# Patient Record
Sex: Male | Born: 1940 | Race: Black or African American | Hispanic: No | Smoking: Never smoker
Health system: Southern US, Community
[De-identification: ages and names within clinical notes are randomized; demographics above are authoritative.]

## PROBLEM LIST (undated history)

## (undated) DIAGNOSIS — I10 Essential (primary) hypertension: Secondary | ICD-10-CM

## (undated) DIAGNOSIS — Z972 Presence of dental prosthetic device (complete) (partial): Secondary | ICD-10-CM

## (undated) DIAGNOSIS — K56609 Unspecified intestinal obstruction, unspecified as to partial versus complete obstruction: Secondary | ICD-10-CM

## (undated) DIAGNOSIS — D649 Anemia, unspecified: Secondary | ICD-10-CM

## (undated) DIAGNOSIS — I2510 Atherosclerotic heart disease of native coronary artery without angina pectoris: Secondary | ICD-10-CM

## (undated) DIAGNOSIS — C61 Malignant neoplasm of prostate: Secondary | ICD-10-CM

## (undated) DIAGNOSIS — K219 Gastro-esophageal reflux disease without esophagitis: Secondary | ICD-10-CM

## (undated) DIAGNOSIS — E7800 Pure hypercholesterolemia, unspecified: Secondary | ICD-10-CM

## (undated) DIAGNOSIS — Z8719 Personal history of other diseases of the digestive system: Secondary | ICD-10-CM

## (undated) DIAGNOSIS — E119 Type 2 diabetes mellitus without complications: Secondary | ICD-10-CM

## (undated) DIAGNOSIS — C20 Malignant neoplasm of rectum: Secondary | ICD-10-CM

## (undated) HISTORY — PX: PROSTATE SURGERY: SHX751

## (undated) HISTORY — PX: COLOSTOMY: SHX63

---
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
## 1998-03-11 DIAGNOSIS — C61 Malignant neoplasm of prostate: Secondary | ICD-10-CM

## 1998-03-11 HISTORY — DX: Malignant neoplasm of prostate: C61

## 2003-04-13 ENCOUNTER — Other Ambulatory Visit: Payer: Self-pay

## 2003-04-14 ENCOUNTER — Other Ambulatory Visit: Payer: Self-pay

## 2003-04-15 ENCOUNTER — Other Ambulatory Visit: Payer: Self-pay

## 2013-05-28 DIAGNOSIS — B351 Tinea unguium: Secondary | ICD-10-CM | POA: Insufficient documentation

## 2017-09-16 DIAGNOSIS — Z794 Long term (current) use of insulin: Secondary | ICD-10-CM | POA: Insufficient documentation

## 2017-09-16 DIAGNOSIS — I10 Essential (primary) hypertension: Secondary | ICD-10-CM | POA: Insufficient documentation

## 2017-09-16 DIAGNOSIS — E119 Type 2 diabetes mellitus without complications: Secondary | ICD-10-CM | POA: Insufficient documentation

## 2017-09-16 DIAGNOSIS — N400 Enlarged prostate without lower urinary tract symptoms: Secondary | ICD-10-CM | POA: Insufficient documentation

## 2017-09-16 DIAGNOSIS — I2510 Atherosclerotic heart disease of native coronary artery without angina pectoris: Secondary | ICD-10-CM | POA: Insufficient documentation

## 2017-10-31 ENCOUNTER — Encounter: Payer: Self-pay | Admitting: *Deleted

## 2017-10-31 ENCOUNTER — Observation Stay
Admission: EM | Admit: 2017-10-31 | Discharge: 2017-11-01 | Disposition: A | Discharge status: Home / Self Care | Payer: Medicare Other | Attending: Internal Medicine | Admitting: Internal Medicine

## 2017-10-31 ENCOUNTER — Emergency Department: Payer: Medicare Other

## 2017-10-31 ENCOUNTER — Other Ambulatory Visit: Payer: Self-pay

## 2017-10-31 DIAGNOSIS — I10 Essential (primary) hypertension: Secondary | ICD-10-CM | POA: Insufficient documentation

## 2017-10-31 DIAGNOSIS — R531 Weakness: Secondary | ICD-10-CM | POA: Diagnosis not present

## 2017-10-31 DIAGNOSIS — Z794 Long term (current) use of insulin: Secondary | ICD-10-CM | POA: Diagnosis not present

## 2017-10-31 DIAGNOSIS — I4729 Other ventricular tachycardia: Secondary | ICD-10-CM

## 2017-10-31 DIAGNOSIS — I472 Ventricular tachycardia, unspecified: Secondary | ICD-10-CM

## 2017-10-31 DIAGNOSIS — E1165 Type 2 diabetes mellitus with hyperglycemia: Secondary | ICD-10-CM | POA: Insufficient documentation

## 2017-10-31 DIAGNOSIS — E7800 Pure hypercholesterolemia, unspecified: Secondary | ICD-10-CM | POA: Insufficient documentation

## 2017-10-31 DIAGNOSIS — N179 Acute kidney failure, unspecified: Secondary | ICD-10-CM | POA: Diagnosis not present

## 2017-10-31 DIAGNOSIS — R002 Palpitations: Secondary | ICD-10-CM | POA: Insufficient documentation

## 2017-10-31 DIAGNOSIS — Z66 Do not resuscitate: Secondary | ICD-10-CM | POA: Insufficient documentation

## 2017-10-31 DIAGNOSIS — Z7982 Long term (current) use of aspirin: Secondary | ICD-10-CM | POA: Diagnosis not present

## 2017-10-31 DIAGNOSIS — Z79899 Other long term (current) drug therapy: Secondary | ICD-10-CM | POA: Diagnosis not present

## 2017-10-31 DIAGNOSIS — E860 Dehydration: Secondary | ICD-10-CM | POA: Diagnosis not present

## 2017-10-31 DIAGNOSIS — Z7901 Long term (current) use of anticoagulants: Secondary | ICD-10-CM | POA: Insufficient documentation

## 2017-10-31 DIAGNOSIS — I82409 Acute embolism and thrombosis of unspecified deep veins of unspecified lower extremity: Secondary | ICD-10-CM | POA: Diagnosis not present

## 2017-10-31 DIAGNOSIS — I2510 Atherosclerotic heart disease of native coronary artery without angina pectoris: Secondary | ICD-10-CM | POA: Diagnosis not present

## 2017-10-31 HISTORY — DX: Pure hypercholesterolemia, unspecified: E78.00

## 2017-10-31 HISTORY — DX: Type 2 diabetes mellitus without complications: E11.9

## 2017-10-31 HISTORY — DX: Essential (primary) hypertension: I10

## 2017-10-31 HISTORY — DX: Atherosclerotic heart disease of native coronary artery without angina pectoris: I25.10

## 2017-10-31 LAB — COMPREHENSIVE METABOLIC PANEL
ALK PHOS: 62 U/L (ref 38–126)
ALT: 20 U/L (ref 0–44)
ANION GAP: 9 (ref 5–15)
AST: 28 U/L (ref 15–41)
Albumin: 3.6 g/dL (ref 3.5–5.0)
BUN: 22 mg/dL (ref 8–23)
CALCIUM: 8.9 mg/dL (ref 8.9–10.3)
CO2: 22 mmol/L (ref 22–32)
Chloride: 106 mmol/L (ref 98–111)
Creatinine, Ser: 1.51 mg/dL — ABNORMAL HIGH (ref 0.61–1.24)
GFR calc non Af Amer: 42 mL/min — ABNORMAL LOW (ref >60)
GFR, EST AFRICAN AMERICAN: 49 mL/min — AB (ref >60)
Glucose, Bld: 206 mg/dL — ABNORMAL HIGH (ref 70–99)
POTASSIUM: 3.6 mmol/L (ref 3.5–5.1)
SODIUM: 137 mmol/L (ref 135–145)
Total Bilirubin: 0.7 mg/dL (ref 0.3–1.2)
Total Protein: 6.8 g/dL (ref 6.5–8.1)

## 2017-10-31 LAB — CBC WITH DIFFERENTIAL/PLATELET
Basophils Absolute: 0 10*3/uL (ref 0–0.1)
Basophils Relative: 1 %
EOS ABS: 0 10*3/uL (ref 0–0.7)
EOS PCT: 1 %
HCT: 33.1 % — ABNORMAL LOW (ref 40.0–52.0)
Hemoglobin: 11.1 g/dL — ABNORMAL LOW (ref 13.0–18.0)
Lymphocytes Relative: 28 %
Lymphs Abs: 1.4 10*3/uL (ref 1.0–3.6)
MCH: 26.8 pg (ref 26.0–34.0)
MCHC: 33.5 g/dL (ref 32.0–36.0)
MCV: 80.1 fL (ref 80.0–100.0)
MONOS PCT: 9 %
Monocytes Absolute: 0.4 10*3/uL (ref 0.2–1.0)
Neutro Abs: 3.1 10*3/uL (ref 1.4–6.5)
Neutrophils Relative %: 61 %
PLATELETS: 254 10*3/uL (ref 150–440)
RBC: 4.14 MIL/uL — AB (ref 4.40–5.90)
RDW: 16.1 % — ABNORMAL HIGH (ref 11.5–14.5)
WBC: 5 10*3/uL (ref 3.8–10.6)

## 2017-10-31 LAB — PROTIME-INR
INR: 1.03
PROTHROMBIN TIME: 13.4 s (ref 11.4–15.2)

## 2017-10-31 LAB — ETHANOL: Alcohol, Ethyl (B): <10 mg/dL (ref <10)

## 2017-10-31 LAB — CK: Total CK: 287 U/L (ref 49–397)

## 2017-10-31 LAB — MAGNESIUM: Magnesium: 1.9 mg/dL (ref 1.7–2.4)

## 2017-10-31 LAB — TROPONIN I: TROPONIN I: 0.05 ng/mL — AB (ref <0.03)

## 2017-10-31 LAB — GLUCOSE, CAPILLARY: Glucose-Capillary: 126 mg/dL — ABNORMAL HIGH (ref 70–99)

## 2017-10-31 LAB — LACTIC ACID, PLASMA
LACTIC ACID, VENOUS: 2 mmol/L — AB (ref 0.5–1.9)
Lactic Acid, Venous: 1 mmol/L (ref 0.5–1.9)

## 2017-10-31 LAB — LIPASE, BLOOD: LIPASE: 23 U/L (ref 11–51)

## 2017-10-31 MED ORDER — NIACIN ER (ANTIHYPERLIPIDEMIC) 500 MG PO TBCR
500.0000 mg | EXTENDED_RELEASE_TABLET | Freq: Every evening | ORAL | Status: DC
  Filled 2017-10-31: qty 1

## 2017-10-31 MED ORDER — INSULIN ASPART 100 UNIT/ML ~~LOC~~ SOLN
0.0000 [IU] | Freq: Three times a day (TID) | SUBCUTANEOUS | Status: DC
  Administered 2017-11-01: 3 [IU] via SUBCUTANEOUS
  Filled 2017-10-31: qty 1

## 2017-10-31 MED ORDER — AMIODARONE HCL IN DEXTROSE 360-4.14 MG/200ML-% IV SOLN
30.0000 mg/h | INTRAVENOUS | Status: DC
  Administered 2017-11-01: 30 mg/h via INTRAVENOUS
  Filled 2017-10-31: qty 200

## 2017-10-31 MED ORDER — EZETIMIBE 10 MG PO TABS
10.0000 mg | ORAL_TABLET | Freq: Every day | ORAL | Status: DC
  Administered 2017-11-01: 10 mg via ORAL
  Filled 2017-10-31: qty 1

## 2017-10-31 MED ORDER — ACETAMINOPHEN 325 MG PO TABS: 650.0000 mg | ORAL_TABLET | Freq: Four times a day (QID) | ORAL | Status: DC | PRN

## 2017-10-31 MED ORDER — SODIUM CHLORIDE 0.9 % IV BOLUS
1000.0000 mL | Freq: Once | INTRAVENOUS | Status: AC
  Administered 2017-10-31: 1000 mL via INTRAVENOUS

## 2017-10-31 MED ORDER — AMIODARONE IV BOLUS ONLY 150 MG/100ML
150.0000 mg | Freq: Once | INTRAVENOUS | Status: AC
  Administered 2017-10-31: 150 mg via INTRAVENOUS
  Filled 2017-10-31: qty 100

## 2017-10-31 MED ORDER — SODIUM CHLORIDE 0.9 % IV SOLN
INTRAVENOUS | Status: AC
  Administered 2017-10-31: via INTRAVENOUS

## 2017-10-31 MED ORDER — POLYETHYLENE GLYCOL 3350 17 G PO PACK: 17.0000 g | PACK | Freq: Every day | ORAL | Status: DC | PRN

## 2017-10-31 MED ORDER — SODIUM CHLORIDE 0.9 % IV SOLN
INTRAVENOUS | Status: DC | PRN
  Administered 2017-10-31: 19:00:00 via INTRAVENOUS

## 2017-10-31 MED ORDER — METOPROLOL SUCCINATE ER 50 MG PO TB24
50.0000 mg | ORAL_TABLET | Freq: Every day | ORAL | Status: DC
  Administered 2017-11-01: 50 mg via ORAL
  Filled 2017-10-31: qty 1

## 2017-10-31 MED ORDER — ASPIRIN EC 81 MG PO TBEC: 81.0000 mg | DELAYED_RELEASE_TABLET | Freq: Every evening | ORAL | Status: DC

## 2017-10-31 MED ORDER — ACETAMINOPHEN 650 MG RE SUPP: 650.0000 mg | Freq: Four times a day (QID) | RECTAL | Status: DC | PRN

## 2017-10-31 MED ORDER — ENOXAPARIN SODIUM 40 MG/0.4ML ~~LOC~~ SOLN: 40.0000 mg | SUBCUTANEOUS | Status: DC

## 2017-10-31 MED ORDER — AMIODARONE HCL IN DEXTROSE 360-4.14 MG/200ML-% IV SOLN
60.0000 mg/h | INTRAVENOUS | Status: AC
  Administered 2017-10-31: 60 mg/h via INTRAVENOUS
  Filled 2017-10-31: qty 200

## 2017-10-31 MED ORDER — MAGNESIUM SULFATE 2 GM/50ML IV SOLN
2.0000 g | Freq: Once | INTRAVENOUS | Status: AC
  Administered 2017-10-31: 2 g via INTRAVENOUS
  Filled 2017-10-31: qty 50

## 2017-10-31 MED ORDER — INSULIN ASPART 100 UNIT/ML ~~LOC~~ SOLN: 0.0000 [IU] | Freq: Every day | SUBCUTANEOUS | Status: DC

## 2017-10-31 NOTE — H&P (Signed)
[facility] NAME: Willie [name]    MR#:  [id number]  DATE OF BIRTH:  08/23/1940  DATE OF ADMISSION:  10/31/2017  PRIMARY CARE PHYSICIAN: [name], MD   REQUESTING/REFERRING PHYSICIAN: Hinda Kehr, MD  CHIEF COMPLAINT:   Chief Complaint  Patient presents with  . Irregular Heart Beat    HISTORY OF PRESENT ILLNESS:  Willie [name]  is a 77 y.o. male with a known history of CAD, type 2 diabetes, hypertension, hyperlipidemia who presented to the ED with weakness. He was mowing the lawn this morning when he suddenly felt lightheaded and weak.  He states he had not drink much water during the day.  He checked his blood sugar and it was 120.  He ate a snack.  Later that afternoon around 4 PM, he started getting sweaty and shaky.  He has a friend who is an EMT and he noticed that he had an irregular heart rate.  The EMT suggested that he come to the hospital for further evaluation.  Patient denies any chest pain, shortness of breath, or palpitations.   In the ED, he was noted to have multiple episodes of nonsustained ventricular tachycardia.  He was asymptomatic during the V. tach.  EDP discussed the case with Dr. Ubaldo Glassing who recommended that patient receive IV amiodarone 150 mg, followed by an amiodarone drip.  He was also given magnesium 2 g IV.  Hospitalists were called for admission.  PAST MEDICAL HISTORY:   Past Medical History:  Diagnosis Date  . Coronary artery disease   . Diabetes mellitus without complication ([place])   . Hypercholesterolemia   . Hypertension     PAST SURGICAL HISTORY:   Past Surgical History:  Procedure Laterality Date  . PROSTATE SURGERY      SOCIAL HISTORY:   Social History   Tobacco Use  . Smoking status: Never Smoker  . Smokeless tobacco: Never Used  Substance Use Topics  . Alcohol use: Not Currently    FAMILY HISTORY:  History reviewed. No pertinent family history.  DRUG ALLERGIES:    No Known Allergies  REVIEW OF SYSTEMS:   Review of Systems  Constitutional: Negative for chills and fever.  HENT: Negative for congestion and sore throat.   Eyes: Negative for blurred vision and double vision.  Respiratory: Negative for cough and shortness of breath.   Cardiovascular: Negative for chest pain, palpitations and leg swelling.  Gastrointestinal: Negative for abdominal pain, nausea and vomiting.  Genitourinary: Negative for dysuria and frequency.  Musculoskeletal: Negative for back pain and neck pain.  Neurological: Positive for dizziness and weakness. Negative for headaches.  Psychiatric/Behavioral: Negative for depression and hallucinations.     MEDICATIONS AT HOME:   Prior to Admission medications   Medication Sig Start Date End Date Taking? Authorizing Provider  HUMALOG MIX 75/25 KWIKPEN (75-25) 100 UNIT/ML Kwikpen  08/02/17  Yes [provider]  lisinopril (PRINIVIL,ZESTRIL) 5 MG tablet Take 5 mg by mouth daily. 08/02/17  Yes [provider]  metoprolol succinate (TOPROL-XL) 50 MG 24 hr tablet Take 50 mg by mouth daily. 10/03/17  Yes [provider]  niacin (NIASPAN) 500 MG CR tablet Take 500 mg by mouth daily. 08/02/17  Yes [provider]      VITAL SIGNS:  Blood pressure 131/67, pulse 78, temperature 99.1 F (37.3 C), temperature source Oral, resp. rate 13, height 5\' 3"  (1.6 m), weight 83.9 kg, SpO2 100 %.  PHYSICAL EXAMINATION:  Physical Exam  GENERAL:  77 y.o.-year-old patient lying in the bed with no acute distress.  EYES: Pupils equal, round, reactive to light and accommodation. No scleral icterus. Extraocular muscles intact.  HEENT: Head atraumatic, normocephalic. Oropharynx and nasopharynx clear.  NECK:  Supple, no jugular venous distention. No thyroid enlargement, no tenderness.  LUNGS: Normal breath sounds bilaterally, no wheezing, rales,rhonchi or crepitation. No use of accessory muscles of respiration.   CARDIOVASCULAR: RRR, S1, S2 normal. No murmurs, rubs, or gallops.  ABDOMEN: Soft, nontender, nondistended. Bowel sounds present. No organomegaly or mass.  EXTREMITIES: No pedal edema, cyanosis, or clubbing.  NEUROLOGIC: Cranial nerves II through XII are intact. Muscle strength 5/5 in all extremities. Sensation intact. Gait not checked.  PSYCHIATRIC: The patient is alert and oriented x 3.  SKIN: No obvious rash, lesion, or ulcer.   LABORATORY PANEL:   CBC Recent Labs  Lab 10/31/17 1702  WBC 5.0  HGB 11.1*  HCT 33.1*  PLT 254   ------------------------------------------------------------------------------------------------------------------  Chemistries  Recent Labs  Lab 10/31/17 1702  NA 137  K 3.6  CL 106  CO2 22  GLUCOSE 206*  BUN 22  CREATININE 1.51*  CALCIUM 8.9  MG 1.9  AST 28  ALT 20  ALKPHOS 62  BILITOT 0.7   ------------------------------------------------------------------------------------------------------------------  Cardiac Enzymes Recent Labs  Lab 10/31/17 1702  TROPONINI 0.05*   ------------------------------------------------------------------------------------------------------------------  RADIOLOGY:  Dg Chest Port 1 View  Result Date: 10/31/2017 CLINICAL DATA:  Irregular heartbeat EXAM: PORTABLE CHEST 1 VIEW COMPARISON:  None. FINDINGS: The heart size and mediastinal contours are within normal limits. Both lungs are clear. The visualized skeletal structures are unremarkable. IMPRESSION: No active disease. Electronically Signed   By: Inez Catalina M.D.   On: 10/31/2017 17:22      IMPRESSION AND PLAN:   Recurrent episodes of ventricular tachycardia- asymptomatic during episodes. Hemodynamically stable. Now in NSR. - s/p amiodarone 150mg  IV, now on amiodarone drip - continue home metoprolol - cardiology consulted - cardiac monitoring   Lactic acidosis- likely related to working outside today and not drinking enough water.  - trend  lactic acid - MIVFs  AKI- likely related to dehydration with working outside and not drinking water today. Cr 1.51 in the ED (only previous value per record is 1.2). - hold lisinopril - MIVFs - recheck Cr in the morning  Hypertension- normotensive - holding lisinopril - continue metoprolol  Type 2 diabetes- hyperglycemic to 206. - moderate SSI  Hyperlipidemia- stable - continue zetia and niacin  All the records are reviewed and case discussed with ED provider. Management plans discussed with the patient, family and they are in agreement.  CODE STATUS: DNI  TOTAL TIME TAKING CARE OF THIS PATIENT: 35 minutes.    Berna Spare Mabrey Howland M.D on 10/31/2017 at 7:50 PM  Between 7am to 6pm - Pager - [phone]  After 6pm go to [email or web address] - Proofreader  [facility]  Office  [phone]  CC: Primary care physician; [name], MD   Note: This dictation was prepared with Dragon dictation along with smaller phrase technology. Any transcriptional errors that result from this process are unintentional.

## 2017-10-31 NOTE — ED Notes (Signed)
Placed on zoll   Iv infusing well  Pt on cardiac monitor

## 2017-10-31 NOTE — ED Notes (Signed)
Pt given meal [place] sandwich and  Taken well

## 2017-10-31 NOTE — ED Notes (Signed)
Verbal order from admitting MD to change order to telemetry

## 2017-10-31 NOTE — ED Triage Notes (Signed)
Patient arrived Via ems pt was working outside today in [place] weak  diaporetic   Feels better  Now  Denies any chest pain he is awake and alert  At this time  Iv infusing well

## 2017-10-31 NOTE — ED Notes (Signed)
Patient is laying comfortably in stretcher at this time with no signs of distress present. Equal, unlabored rise and fall of chest noted within normal rate. VS stable. Continues to deny pain or abnormal feeling. Will continue to monitor.

## 2017-10-31 NOTE — ED Notes (Signed)
Report given to brittany at this time

## 2017-10-31 NOTE — ED Notes (Signed)
Pt continues to have periodic runs of v-tach, but more sustained, Dr. Karma Greaser notified, new orders received.

## 2017-10-31 NOTE — ED Notes (Signed)
Pt sitting in stretcher, with VSS, in NAD at this time, displaying equal and unlabored respirations. Pt family at bedside. All deny needs at this time. Will continue to monitor.

## 2017-10-31 NOTE — ED Notes (Signed)
2A unable to take report. Asked me to call back in 10 min

## 2017-10-31 NOTE — ED Provider Notes (Signed)
[facility] Emergency Department Provider Note  ____________________________________________   First MD Initiated Contact with Patient 10/31/17 1700     (approximate)  I have reviewed the triage vital signs and the nursing notes.   HISTORY  Chief Complaint Irregular Heart Beat    HPI Willie [name] is a 77 y.o. male with medical history as listed below who goes to Dr. Clayborn Bigness for his cardiology care.  He presents by EMS for evaluation of acute onset of weakness, sweating, palpitations.  He says he feels much better now.  He was doing some yard work outside and thinks he just got overheated.  He denies any nausea or vomiting.  He has no abdominal pain or chest pain and has had no shortness of breath.  Of note, EMS reported multiple nonsustained runs of ventricular tachycardia in route to the hospital.  He is in normal sinus rhythm upon arrival but had another short run of ventricular tachycardia after he was on the monitor.  See hospital course below.  Patient states he has not had any recent fever/chills and has no dysuria.  He has had no cramping in his muscles.  No recent medication changes.  Denies drugs and alcohol.  Says that the symptoms were severe when they started but now he feels fine.  Nothing in particular made the symptoms better or worse.  Past Medical History:  Diagnosis Date  . Coronary artery disease   . Diabetes mellitus without complication ([place])   . Hypercholesterolemia   . Hypertension     There are no active problems to display for this patient.   Past Surgical History:  Procedure Laterality Date  . PROSTATE SURGERY      Prior to Admission medications   Medication Sig Start Date End Date Taking? Authorizing Provider  HUMALOG MIX 75/25 KWIKPEN (75-25) 100 UNIT/ML Kwikpen  08/02/17  Yes [provider]  lisinopril (PRINIVIL,ZESTRIL) 5 MG tablet Take 5 mg by mouth daily. 08/02/17  Yes [provider]  metoprolol  succinate (TOPROL-XL) 50 MG 24 hr tablet Take 50 mg by mouth daily. 10/03/17  Yes [provider]  niacin (NIASPAN) 500 MG CR tablet Take 500 mg by mouth daily. 08/02/17  Yes [provider]    Allergies Patient has no known allergies.  History reviewed. No pertinent family history.  Social History Social History   Tobacco Use  . Smoking status: Never Smoker  . Smokeless tobacco: Never Used  Substance Use Topics  . Alcohol use: Not Currently  . Drug use: Not on file    Review of Systems Constitutional: No fever/chills Eyes: No visual changes. ENT: No sore throat. Cardiovascular: Palpitations.  Denies chest pain.  Acute episode of diaphoresis and lightheadedness. Respiratory: Denies shortness of breath. Gastrointestinal: No abdominal pain.  No nausea, no vomiting.  No diarrhea.  No constipation. Genitourinary: Negative for dysuria. Musculoskeletal: Negative for neck pain.  Negative for back pain. Integumentary: Negative for rash. Neurological: Negative for headaches, focal weakness or numbness.   ____________________________________________   PHYSICAL EXAM:  VITAL SIGNS: ED Triage Vitals  Enc Vitals Group     BP 10/31/17 1704 116/68     Pulse Rate 10/31/17 1704 96     Resp 10/31/17 1704 (!) 23     Temp 10/31/17 1704 99.1 F (37.3 C)     Temp Source 10/31/17 1704 Oral     SpO2 10/31/17 1704 100 %     Weight 10/31/17 1713 83.9 kg (185 lb)  Height 10/31/17 1713 1.6 m (5\' 3" )     Head Circumference --      Peak Flow --      Pain Score 10/31/17 1710 0     Pain Loc --      Pain Edu? --      Excl. in [place]? --     Constitutional: Alert and oriented. Generally well appearing. Eyes: Conjunctivae are normal.  Head: Atraumatic. Nose: No congestion/rhinnorhea. Mouth/Throat: Mucous membranes are moist. Neck: No stridor.  No meningeal signs.   Cardiovascular: Normal rate, regular rhythm. However, he has intermittent episodes of non-sustained v-tach.   Good peripheral circulation. Grossly normal heart sounds. Respiratory: Normal respiratory effort.  No retractions. Lungs CTAB. Gastrointestinal: Soft and nontender. No distention.  Musculoskeletal: No lower extremity tenderness nor edema. No gross deformities of extremities. Neurologic:  Normal speech and language. No gross focal neurologic deficits are appreciated.  Skin:  Skin is warm, dry and intact. No rash noted. Psychiatric: Mood and affect are normal. Speech and behavior are normal.  ____________________________________________   LABS (all labs ordered are listed, but only abnormal results are displayed)  Labs Reviewed  COMPREHENSIVE METABOLIC PANEL - Abnormal; Notable for the following components:      Result Value   Glucose, Bld 206 (*)    Creatinine, Ser 1.51 (*)    GFR calc non Af Amer 42 (*)    GFR calc Af Amer 49 (*)    All other components within normal limits  TROPONIN I - Abnormal; Notable for the following components:   Troponin I 0.05 (*)    All other components within normal limits  LACTIC ACID, PLASMA - Abnormal; Notable for the following components:   Lactic Acid, Venous 2.0 (*)    All other components within normal limits  CBC WITH DIFFERENTIAL/PLATELET - Abnormal; Notable for the following components:   RBC 4.14 (*)    Hemoglobin 11.1 (*)    HCT 33.1 (*)    RDW 16.1 (*)    All other components within normal limits  ETHANOL  LIPASE, BLOOD  PROTIME-INR  MAGNESIUM  CK  LACTIC ACID, PLASMA   ____________________________________________  EKG  ED ECG REPORT I, Hinda Kehr, the attending physician, personally viewed and interpreted this ECG.  Date: 10/31/2017 EKG Time: 16: 58 Rate: 98 Rhythm: normal sinus rhythm QRS Axis: normal Intervals: normal ST/T Wave abnormalities: Non-specific ST segment / T-wave changes, but no evidence of acute ischemia. Narrative Interpretation: Questionable acute ischemia but no evidence of STEMI   ED ECG REPORT I,  Hinda Kehr, the attending physician, personally viewed and interpreted this ECG.  Date: 10/31/2017 EKG Time: 18: 05 Rate: 163 Rhythm: Wide-complex tachycardia consistent with V. tach QRS Axis: normal Intervals: normal ST/T Wave abnormalities: Non-specific ST segment / T-wave changes, but no evidence of acute ischemia. Narrative Interpretation: no evidence of acute ischemia    ____________________________________________  RADIOLOGY I, Hinda Kehr, personally viewed and evaluated these images (plain radiographs) as part of my medical decision making, as well as reviewing the written report by the radiologist.  ED MD interpretation:  No evidence of acute abnormality on CXR  Official radiology report(s): Dg Chest Port 1 View  Result Date: 10/31/2017 CLINICAL DATA:  Irregular heartbeat EXAM: PORTABLE CHEST 1 VIEW COMPARISON:  None. FINDINGS: The heart size and mediastinal contours are within normal limits. Both lungs are clear. The visualized skeletal structures are unremarkable. IMPRESSION: No active disease. Electronically Signed   By: Inez Catalina M.D.   On: 10/31/2017  17:22    ____________________________________________   PROCEDURES  Critical Care performed: Yes, see critical care procedure note(s)   Procedure(s) performed:   .Critical Care Performed by: Hinda Kehr, MD Authorized by: Hinda Kehr, MD   Critical care provider statement:    Critical care time (minutes):  30   Critical care time was exclusive of:  Separately billable procedures and treating other patients   Critical care was necessary to treat or prevent imminent or life-threatening deterioration of the following conditions:  Circulatory failure (ventricular tachycardia)   Critical care was time spent personally by me on the following activities:  Development of treatment plan with patient or surrogate, discussions with consultants, evaluation of patient's response to treatment, examination of patient,  obtaining history from patient or surrogate, ordering and performing treatments and interventions, ordering and review of laboratory studies, ordering and review of radiographic studies, pulse oximetry, re-evaluation of patient's condition and review of old charts     ____________________________________________   INITIAL IMPRESSION / [place] / ED COURSE  As part of my medical decision making, I reviewed the following data within the [facility] notes reviewed and incorporated, Labs reviewed , EKG interpreted , Old chart reviewed, Discussed with admitting physician , Discussed with cardiologist (Dr. Ubaldo Glassing by phone) and Notes from prior clinic visits    Differential diagnosis includes, but is not limited to, electrolyte abnormality, "silent" MI leading to cardiac muscle dysfunction, pneumonia, pericardial effusion.  The patient is in no distress and reports no chest pain and no shortness of breath.  However, he has had multiple episodes of nonsustained ventricular tachycardia with as many as 20 beats,.   However he remains asymptomatic was reassuring.  He is on the [place].  His troponin is 0.05 which likely represents demand ischemia.  Lactic acid is 2.0, likely representative of working outside it for an extended period today and is getting 1 L normal saline IV bolus.  No evidence of acute infection.  No leukocytosis, no elevated lipase, normal CK.  Metabolic panel is reassuring within normal limits except for slightly elevated creatinine of 1.5 and I have no other creatinine on record for him so this may be acute volume depletion.  I will discuss the case with Dr. Ubaldo Glassing but at this point I think that the patient should probably be on amiodarone given the recurrent ventricular tachycardia.  Clinical Course as of Nov 01 1814  Fri Oct 31, 2017  1740 Paged Dr. Ubaldo Glassing to discuss the case.   [CF]  1758 Lactic Acid, Venous(!!): 2.0 [CF]  1816 Dr. Ubaldo Glassing agreed with my  plan for amiodarone 150 mg IV followed by an amiodarone infusion.  He also recommended that I give the patient magnesium 2 g IV regardless of what his magnesium level comes back at.  I will continue hydration.  I discussed the case with the hospitalist who will admit.  Patient is hemodynamically stable.   [CF]    Clinical Course User Index [CF] Hinda Kehr, MD    ____________________________________________  FINAL CLINICAL IMPRESSION(S) / ED DIAGNOSES  Final diagnoses:  Ventricular tachycardia ([place])     MEDICATIONS GIVEN DURING THIS VISIT:  Medications  magnesium sulfate IVPB 2 g 50 mL (has no administration in time range)  amiodarone (NEXTERONE) IV bolus only 150 mg/100 mL (has no administration in time range)  amiodarone (NEXTERONE PREMIX) 360-4.14 MG/200ML-% (1.8 mg/mL) IV infusion (has no administration in time range)  amiodarone (NEXTERONE PREMIX) 360-4.14 MG/200ML-% (1.8 mg/mL) IV infusion (  has no administration in time range)  sodium chloride 0.9 % bolus 1,000 mL (has no administration in time range)     ED Discharge Orders    None       Note:  This document was prepared using Dragon voice recognition software and may include unintentional dictation errors.    Hinda Kehr, MD 10/31/17 1816

## 2017-10-31 NOTE — ED Notes (Signed)
Pt had run of v-tach around this time, was able to capture this and show Dr. Karma Greaser.

## 2017-10-31 NOTE — ED Notes (Signed)
Pt remains stable and denies c/o.

## 2017-10-31 NOTE — ED Notes (Addendum)
Pt eating sandwich tray provided at this time. Talking with family in room. Continues to deny pain or c/o. Stable NSR remains

## 2017-10-31 NOTE — ED Notes (Signed)
CCU Charge RN states that they are unable to receive pt at this time.

## 2017-10-31 NOTE — Progress Notes (Signed)
Family Meeting Note  Advance Directive:yes  Today a meeting took place with the Patient.  Patient is able to participate.  The following clinical team members were present during this meeting:MD  The following were discussed:Patient's diagnosis: , Patient's progosis: Unable to determine and Goals for treatment: Continue present management and DNR.  Additional follow-up to be provided: prn  Time spent during discussion:20 minutes  Evette Doffing, MD

## 2017-11-01 ENCOUNTER — Encounter: Payer: Self-pay | Admitting: Emergency Medicine

## 2017-11-01 LAB — COMPREHENSIVE METABOLIC PANEL
ALT: 18 U/L (ref 0–44)
AST: 22 U/L (ref 15–41)
Albumin: 3 g/dL — ABNORMAL LOW (ref 3.5–5.0)
Alkaline Phosphatase: 55 U/L (ref 38–126)
Anion gap: 5 (ref 5–15)
BILIRUBIN TOTAL: 0.3 mg/dL (ref 0.3–1.2)
BUN: 19 mg/dL (ref 8–23)
CHLORIDE: 112 mmol/L — AB (ref 98–111)
CO2: 23 mmol/L (ref 22–32)
Calcium: 8 mg/dL — ABNORMAL LOW (ref 8.9–10.3)
Creatinine, Ser: 1.16 mg/dL (ref 0.61–1.24)
GFR calc Af Amer: >60 mL/min (ref >60)
GFR, EST NON AFRICAN AMERICAN: 58 mL/min — AB (ref >60)
Glucose, Bld: 128 mg/dL — ABNORMAL HIGH (ref 70–99)
POTASSIUM: 4 mmol/L (ref 3.5–5.1)
Sodium: 140 mmol/L (ref 135–145)
Total Protein: 5.9 g/dL — ABNORMAL LOW (ref 6.5–8.1)

## 2017-11-01 LAB — GLUCOSE, CAPILLARY
GLUCOSE-CAPILLARY: 62 mg/dL — AB (ref 70–99)
GLUCOSE-CAPILLARY: 174 mg/dL — AB (ref 70–99)

## 2017-11-01 LAB — MAGNESIUM: Magnesium: 2.3 mg/dL (ref 1.7–2.4)

## 2017-11-01 NOTE — Discharge Summary (Signed)
[facility] [facility] [place] NAME: Willie [name]    MR#:  [id number]  DATE OF BIRTH:  07-Dec-1940  DATE OF ADMISSION:  10/31/2017   ADMITTING PHYSICIAN: Sela Hua, MD  DATE OF DISCHARGE:  11/01/17  PRIMARY CARE PHYSICIAN: [name], MD   ADMISSION DIAGNOSIS:   Ventricular tachycardia ([place]) [I47.2]  DISCHARGE DIAGNOSIS:   Active Problems:   Ventricular tachycardia ([place])   SECONDARY DIAGNOSIS:   Past Medical History:  Diagnosis Date  . Coronary artery disease   . Diabetes mellitus without complication ([place])   . Hypercholesterolemia   . Hypertension     HOSPITAL COURSE:   77 year old male with past medical history significant for CAD status post PCI several years ago, type 2 diabetes mellitus, hypertension presents to hospital secondary to lightheadedness and weakness and noted to have a cardiac arrhythmia  1.  Cardiac arrhythmia-initially thought to be nonsustained V. tach, concerns for underlying  paroxysmal A. Fib -Triggered due to dehydration.  Patient was given IV fluids, electrolytes replaced. -Remains in normal sinus rhythm at this time. -Echocardiogram is pending.  Cardiology consult is appreciated -Encourage ambulation and discharge today if echo is normal. -Was started on amiodarone drip after bolus, however discontinued as patient remained in normal sinus rhythm to sinus bradycardia -Patient is completely asymptomatic and feels well.  2.  Acute renal insufficiency-secondary to dehydration, received IV fluids and renal function is much improved  3.  Hypertension-continue Toprol and low-dose lisinopril  4.  Diabetes mellitus-insulin-dependent, on Humalog mix once a day, continue home medicines.  Ambulating well and will be discharged today.  DISCHARGE CONDITIONS:   Guarded  CONSULTS OBTAINED:   Treatment Team:  Teodoro Spray, MD  DRUG ALLERGIES:   No Known Allergies DISCHARGE MEDICATIONS:    Allergies as of 11/01/2017   No Known Allergies     Medication List    TAKE these medications   aspirin EC 81 MG tablet Take 81 mg by mouth every evening.   ezetimibe 10 MG tablet Commonly known as:  ZETIA Take 10 mg by mouth daily.   HUMALOG MIX 75/25 KWIKPEN (75-25) 100 UNIT/ML Kwikpen Generic drug:  Insulin Lispro Prot & Lispro Inject 35 Units into the skin every evening.   lisinopril 5 MG tablet Commonly known as:  PRINIVIL,ZESTRIL Take 5 mg by mouth daily.   metoprolol succinate 50 MG 24 hr tablet Commonly known as:  TOPROL-XL Take 50 mg by mouth daily.   niacin 500 MG CR tablet Commonly known as:  NIASPAN Take 500 mg by mouth every evening.        DISCHARGE INSTRUCTIONS:   1. PCP f/u in 1-2 weeks 2. Cardiology f/u in 1 week  DIET:   Cardiac diet  ACTIVITY:   Activity as tolerated  OXYGEN:   Home Oxygen: No.  Oxygen Delivery: room air  DISCHARGE LOCATION:   home   If you experience worsening of your admission symptoms, develop shortness of breath, life threatening emergency, suicidal or homicidal thoughts you must seek medical attention immediately by calling 911 or calling your MD immediately  if symptoms less severe.  You Must read complete instructions/literature along with all the possible adverse reactions/side effects for all the Medicines you take and that have been prescribed to you. Take any new Medicines after you have completely understood and accpet all the possible adverse reactions/side effects.   Please note  You were cared for by a hospitalist during your hospital stay.  If you have any questions about your discharge medications or the care you received while you were in the hospital after you are discharged, you can call the unit and asked to speak with the hospitalist on call if the hospitalist that took care of you is not available. Once you are discharged, your primary care physician will handle any further medical issues.  Please note that NO REFILLS for any discharge medications will be authorized once you are discharged, as it is imperative that you return to your primary care physician (or establish a relationship with a primary care physician if you do not have one) for your aftercare needs so that they can reassess your need for medications and monitor your lab values.    On the day of Discharge:  VITAL SIGNS:   Blood pressure 131/65, pulse 63, temperature 98.2 F (36.8 C), temperature source Oral, resp. rate 16, height 5\' 3"  (1.6 m), weight 81.2 kg, SpO2 98 %.  PHYSICAL EXAMINATION:   GENERAL:  77 y.o.-year-old patient lying in the bed with no acute distress.  EYES: Pupils equal, round, reactive to light and accommodation. No scleral icterus. Extraocular muscles intact.  HEENT: Head atraumatic, normocephalic. Oropharynx and nasopharynx clear.  NECK:  Supple, no jugular venous distention. No thyroid enlargement, no tenderness.  LUNGS: Normal breath sounds bilaterally, no wheezing, rales,rhonchi or crepitation. No use of accessory muscles of respiration.  CARDIOVASCULAR: S1, S2 normal. No murmurs, rubs, or gallops.  ABDOMEN: Soft, nontender, nondistended. Bowel sounds present. No organomegaly or mass.  EXTREMITIES: No pedal edema, cyanosis, or clubbing.  NEUROLOGIC: Cranial nerves II through XII are intact. Muscle strength 5/5 in all extremities. Sensation intact. Gait not checked.  PSYCHIATRIC: The patient is alert and oriented x 3.  SKIN: No obvious rash, lesion, or ulcer.   DATA REVIEW:   CBC Recent Labs  Lab 10/31/17 1702  WBC 5.0  HGB 11.1*  HCT 33.1*  PLT 254    Chemistries  Recent Labs  Lab 11/01/17 0330  NA 140  K 4.0  CL 112*  CO2 23  GLUCOSE 128*  BUN 19  CREATININE 1.16  CALCIUM 8.0*  MG 2.3  AST 22  ALT 18  ALKPHOS 55  BILITOT 0.3     Microbiology Results  No results found for this or any previous visit.  RADIOLOGY:  Dg Chest Port 1 View  Result Date:  10/31/2017 CLINICAL DATA:  Irregular heartbeat EXAM: PORTABLE CHEST 1 VIEW COMPARISON:  None. FINDINGS: The heart size and mediastinal contours are within normal limits. Both lungs are clear. The visualized skeletal structures are unremarkable. IMPRESSION: No active disease. Electronically Signed   By: Inez Catalina M.D.   On: 10/31/2017 17:22     Management plans discussed with the patient, family and they are in agreement.  CODE STATUS:     Code Status Orders  (From admission, onward)         Start     Ordered   10/31/17 2305  Limited resuscitation (code)  Continuous    Question Answer Comment  In the event of cardiac or respiratory ARREST: Initiate Code Blue, Call Rapid Response Yes   In the event of cardiac or respiratory ARREST: Perform CPR Yes   In the event of cardiac or respiratory ARREST: Perform Intubation/Mechanical Ventilation No   In the event of cardiac or respiratory ARREST: Use NIPPV/BiPAp only if indicated Yes   In the event of cardiac or respiratory ARREST: Administer ACLS medications if indicated Yes  In the event of cardiac or respiratory ARREST: Perform Defibrillation or Cardioversion if indicated Yes      10/31/17 2304        Code Status History    This patient has a current code status but no historical code status.    Advance Directive Documentation     Most Recent Value  Type of Advance Directive  Healthcare Power of Attorney, Living will  Pre-existing out of facility DNR order (yellow form or pink MOST form)  -  "MOST" Form in Place?  -      TOTAL TIME TAKING CARE OF THIS PATIENT: 38 minutes.    Gladstone Lighter M.D on 11/01/2017 at 2:46 PM  Between 7am to 6pm - Pager - [phone]  After 6pm go to [email or web address] - Proofreader  [facility]  Office  [phone]  CC: Primary care physician; [name], MD   Note: This dictation was prepared with Dragon dictation along with smaller phrase technology.  Any transcriptional errors that result from this process are unintentional.

## 2017-11-01 NOTE — Progress Notes (Signed)
[facility] at [place] NAME: Willie [name]    MR#:  [id number]  DATE OF BIRTH:  [birth year]  SUBJECTIVE:  CHIEF COMPLAINT:   Chief Complaint  Patient presents with  . Irregular Heart Beat   -Feels fine this morning.  Denies any complaints.  Echocardiogram is pending  REVIEW OF SYSTEMS:  Review of Systems  Constitutional: Negative for chills, fever and malaise/fatigue.  HENT: Negative for congestion, ear discharge, hearing loss and nosebleeds.   Eyes: Negative for blurred vision and double vision.  Respiratory: Negative for cough, shortness of breath and wheezing.   Cardiovascular: Negative for chest pain, palpitations and leg swelling.  Gastrointestinal: Negative for abdominal pain, constipation, diarrhea, nausea and vomiting.  Genitourinary: Negative for dysuria.  Musculoskeletal: Negative for myalgias.  Neurological: Negative for dizziness, focal weakness, seizures, weakness and headaches.  Psychiatric/Behavioral: Negative for depression.    DRUG ALLERGIES:  No Known Allergies  VITALS:  Blood pressure 131/65, pulse 63, temperature 98.2 F (36.8 C), temperature source Oral, resp. rate 16, height 5\' 3"  (1.6 m), weight 81.2 kg, SpO2 98 %.  PHYSICAL EXAMINATION:  Physical Exam  GENERAL:  77 y.o.-year-old patient lying in the bed with no acute distress.  EYES: Pupils equal, round, reactive to light and accommodation. No scleral icterus. Extraocular muscles intact.  HEENT: Head atraumatic, normocephalic. Oropharynx and nasopharynx clear.  NECK:  Supple, no jugular venous distention. No thyroid enlargement, no tenderness.  LUNGS: Normal breath sounds bilaterally, no wheezing, rales,rhonchi or crepitation. No use of accessory muscles of respiration.  CARDIOVASCULAR: S1, S2 normal. No murmurs, rubs, or gallops.  ABDOMEN: Soft, nontender, nondistended. Bowel sounds present. No organomegaly or mass.  EXTREMITIES: No pedal edema, cyanosis,  or clubbing.  NEUROLOGIC: Cranial nerves II through XII are intact. Muscle strength 5/5 in all extremities. Sensation intact. Gait not checked.  PSYCHIATRIC: The patient is alert and oriented x 3.  SKIN: No obvious rash, lesion, or ulcer.    LABORATORY PANEL:   CBC Recent Labs  Lab 10/31/17 1702  WBC 5.0  HGB 11.1*  HCT 33.1*  PLT 254   ------------------------------------------------------------------------------------------------------------------  Chemistries  Recent Labs  Lab 11/01/17 0330  NA 140  K 4.0  CL 112*  CO2 23  GLUCOSE 128*  BUN 19  CREATININE 1.16  CALCIUM 8.0*  MG 2.3  AST 22  ALT 18  ALKPHOS 55  BILITOT 0.3   ------------------------------------------------------------------------------------------------------------------  Cardiac Enzymes Recent Labs  Lab 10/31/17 1702  TROPONINI 0.05*   ------------------------------------------------------------------------------------------------------------------  RADIOLOGY:  Dg Chest Port 1 View  Result Date: 10/31/2017 CLINICAL DATA:  Irregular heartbeat EXAM: PORTABLE CHEST 1 VIEW COMPARISON:  None. FINDINGS: The heart size and mediastinal contours are within normal limits. Both lungs are clear. The visualized skeletal structures are unremarkable. IMPRESSION: No active disease. Electronically Signed   By: Inez Catalina M.D.   On: 10/31/2017 17:22    EKG:   Orders placed or performed in visit on 04/15/03  . EKG 12-Lead    ASSESSMENT AND PLAN:   77 year old male with past medical history significant for CAD status post PCI several years ago, type 2 diabetes mellitus, hypertension presents to hospital secondary to lightheadedness and weakness and noted to have a cardiac arrhythmia  1.  Cardiac arrhythmia-initially thought to be nonsustained V. tach, concerns for underlying  paroxysmal A. Fib -Triggered due to dehydration.  Patient was given IV fluids, electrolytes replaced. -Remains in normal sinus  rhythm at this time. -Echocardiogram is pending.  Cardiology consult is appreciated -Encourage ambulation and possible discharge later today if echo is normal. -Was started on amiodarone drip after bolus, however discontinued as patient remained in normal sinus rhythm to sinus bradycardia  2.  Acute renal insufficiency-secondary to dehydration, received IV fluids and renal function is much improved  3.  Hypertension-continue Toprol and low-dose lisinopril  4.  Diabetes mellitus-insulin-dependent, on Humalog mix once a day, continue home medicines.  5.  DVT prophylaxis-Lovenox     All the records are reviewed and case discussed with Care Management/Social Workerr. Management plans discussed with the patient, family and they are in agreement.  CODE STATUS: Full code  TOTAL TIME TAKING CARE OF THIS PATIENT: 38 minutes.   POSSIBLE D/C IN 1-2 DAYS, DEPENDING ON CLINICAL CONDITION.   Gladstone Lighter M.D on 11/01/2017 at 9:43 AM  Between 7am to 6pm - Pager - [phone]  After 6pm go to [email or web address] - password [email or web address]  Office  [phone]  CC: Primary care physician; [name], MD

## 2017-11-01 NOTE — Progress Notes (Signed)
Patient came in in [facility] [place], asymptomatic, placed on amiodarone drip, HR now sinus rhythm in 60s, will d/c amiodarone drip for now.  Cardiology consult pending.  Jacqulyn [facility] 11/01/2017, 2:02 AM

## 2017-11-01 NOTE — Consult Note (Signed)
Cardiology Consultation Note    Patient ID: Willie [name], MRN: [id number], DOB/AGE: 79/05/1940 79 y.o. Admit date: 10/31/2017   Date of Consult: 11/01/2017 Primary Physician: [name], MD Primary Cardiologist: Dr. Clayborn Bigness  Chief Complaint: weakness Reason for Consultation: nonsustained wide complex tach Requesting MD: Dr. Tressia Miners  HPI: Willie [name] is a 77 y.o. male with history of coronary artery disease, type 2 diabetes mellitus, hypertension, hyperlipidemia who presented to the emergency room with complaints  of feeling lightheaded and weak.  He had not had much water to drink.  He presented to the emergency room with complaints of weakness and fatigue.  Electric cardiogram initially showed wide-complex tachycardia on telemetry.  This was felt to be asymptomatic V. tach.  He was given magnesium amiodarone and 2 g of IV magnesium.  Since admission he has been noted to have intermittent brief runs of the irregularly irregular wide-complex tachycardia without axis change sister with possible intermittent atrial fibrillation with aberrancy.  He is ruled out for myocardial infarction.  Laboratories reveal normal serum magnesium, potassium was 3.6, patient had a creatinine 1.51 on admission down to 1.16 after hydration consistent with volume depletion.  CK was normal.  Serum troponin was 0.05.  Echocardiogram is pending.  Patient states that he felt a lot better after eating some candy and drinking some sweetened beverage.  Since receiving IV fluids he has not felt any further dysrhythmia.  He is currently hemodynamically stable.  He feels back to his baseline.  He has no chest pain now and has had no chest pain with fairly vigorous activity at home.  Past Medical History:  Diagnosis Date  . Coronary artery disease   . Diabetes mellitus without complication ([place])   . Hypercholesterolemia   . Hypertension       Surgical History:  Past Surgical History:  Procedure Laterality Date   . PROSTATE SURGERY       Home Meds: Prior to Admission medications   Medication Sig Start Date End Date Taking? Authorizing Provider  aspirin EC 81 MG tablet Take 81 mg by mouth every evening.   Yes [provider]  ezetimibe (ZETIA) 10 MG tablet Take 10 mg by mouth daily.   Yes [provider]  HUMALOG MIX 75/25 KWIKPEN (75-25) 100 UNIT/ML Kwikpen Inject 35 Units into the skin every evening.  08/02/17  Yes [provider]  lisinopril (PRINIVIL,ZESTRIL) 5 MG tablet Take 5 mg by mouth daily. 08/02/17  Yes [provider]  metoprolol succinate (TOPROL-XL) 50 MG 24 hr tablet Take 50 mg by mouth daily. 10/03/17  Yes [provider]  niacin (NIASPAN) 500 MG CR tablet Take 500 mg by mouth every evening.  08/02/17  Yes [provider]    Inpatient Medications:  . aspirin EC  81 mg Oral QPM  . enoxaparin (LOVENOX) injection  40 mg Subcutaneous Q24H  . ezetimibe  10 mg Oral Daily  . insulin aspart  0-15 Units Subcutaneous TID WC  . insulin aspart  0-5 Units Subcutaneous QHS  . metoprolol succinate  50 mg Oral Daily  . niacin  500 mg Oral QPM   . sodium chloride 125 mL/hr at 10/31/17 2347    Allergies: No Known Allergies  Social History   Socioeconomic History  . Marital status: Married    Spouse name: Not on file  . Number of children: Not on file  . Years of education: Not on file  . Highest education level: Not on file  Occupational History  . Not on file  Social Needs  . Financial resource strain: Not hard at all  . Food insecurity:    Worry: Never true    Inability: Never true  . Transportation needs:    Medical: No    Non-medical: No  Tobacco Use  . Smoking status: Never Smoker  . Smokeless tobacco: Never Used  Substance and Sexual Activity  . Alcohol use: Not Currently  . Drug use: Not on file  . Sexual activity: Not on file  Lifestyle  . Physical activity:    Days per week: 2 days    Minutes per session: 30 min   . Stress: Only a little  Relationships  . Social connections:    Talks on phone: Not on file    Gets together: Not on file    Attends religious service: Not on file    Active member of club or organization: Not on file    Attends meetings of clubs or organizations: Not on file    Relationship status: Widowed  . Intimate partner violence:    Fear of current or ex partner: Patient refused    Emotionally abused: Patient refused    Physically abused: Patient refused    Forced sexual activity: Patient refused  Other Topics Concern  . Not on file  Social History Narrative  . Not on file     History reviewed. No pertinent family history.   Review of Systems: A 12-system review of systems was performed and is negative except as noted in the HPI.  Labs: Recent Labs    10/31/17 1702  CKTOTAL 287  TROPONINI 0.05*   Lab Results  Component Value Date   WBC 5.0 10/31/2017   HGB 11.1 (L) 10/31/2017   HCT 33.1 (L) 10/31/2017   MCV 80.1 10/31/2017   PLT 254 10/31/2017    Recent Labs  Lab 11/01/17 0330  NA 140  K 4.0  CL 112*  CO2 23  BUN 19  CREATININE 1.16  CALCIUM 8.0*  PROT 5.9*  BILITOT 0.3  ALKPHOS 55  ALT 18  AST 22  GLUCOSE 128*   No results found for: CHOL, HDL, LDLCALC, TRIG No results found for: DDIMER  Radiology/Studies:  Dg Chest Port 1 View  Result Date: 10/31/2017 CLINICAL DATA:  Irregular heartbeat EXAM: PORTABLE CHEST 1 VIEW COMPARISON:  None. FINDINGS: The heart size and mediastinal contours are within normal limits. Both lungs are clear. The visualized skeletal structures are unremarkable. IMPRESSION: No active disease. Electronically Signed   By: Inez Catalina M.D.   On: 10/31/2017 17:22    Wt Readings from Last 3 Encounters:  10/31/17 81.2 kg    EKG: Sinus rhythm with nonspecific ST-T wave changes.  Physical Exam:  Blood pressure 131/65, pulse 63, temperature 98.2 F (36.8 C), temperature source Oral, resp. rate 16, height 5\' 3"  (1.6 m),  weight 81.2 kg, SpO2 98 %. Body mass index is 31.73 kg/m. General: Well developed, well nourished, in no acute distress. Head: Normocephalic, atraumatic, sclera non-icteric, no xanthomas, nares are without discharge.  Neck: Negative for carotid bruits. JVD not elevated. Lungs: Clear bilaterally to auscultation without wheezes, rales, or rhonchi. Breathing is unlabored. Heart: RRR with S1 S2. No murmurs, rubs, or gallops appreciated. Abdomen: Soft, non-tender, non-distended with normoactive bowel sounds. No hepatomegaly. No rebound/guarding. No obvious abdominal masses. Msk:  Strength and tone appear normal for age. Extremities: No clubbing or cyanosis. No edema.  Distal pedal pulses are 2+ and equal bilaterally.  Neuro: Alert and oriented X 3. No facial asymmetry. No focal deficit. Moves all extremities spontaneously. Psych:  Responds to questions appropriately with a normal affect.     Assessment and Plan  77 year old male with history of coronary artery disease, hypertension who was admitted with dizziness and tachycardia.  He was noted to be significantly dehydrated.  He improved with IV fluids, a bolus of IV amiodarone and magnesium supplementation.  He has had no further sustained dysrhythmias.  His blood pressure and heart rate are in good control with current regimen.  Echocardiogram is pending.  He has ruled out for myocardial infarction.  Will evaluate echo and if there is no significant abnormality, would recommend discharge on admission medications and follow-up with Dr. Clayborn Bigness.  Signed, Teodoro Spray MD 11/01/2017, 9:29 AM Pager: [phone]

## 2018-07-07 DIAGNOSIS — Z8719 Personal history of other diseases of the digestive system: Secondary | ICD-10-CM | POA: Insufficient documentation

## 2018-07-07 DIAGNOSIS — D500 Iron deficiency anemia secondary to blood loss (chronic): Secondary | ICD-10-CM | POA: Insufficient documentation

## 2018-07-07 DIAGNOSIS — Z8711 Personal history of peptic ulcer disease: Secondary | ICD-10-CM | POA: Insufficient documentation

## 2018-07-15 ENCOUNTER — Ambulatory Visit: Payer: Medicare Other | Admitting: Anesthesiology

## 2018-07-15 ENCOUNTER — Encounter
Admission: RE | Disposition: A | Discharge status: Home / Self Care | Payer: Self-pay | Source: Home / Self Care | Attending: Internal Medicine

## 2018-07-15 ENCOUNTER — Encounter: Payer: Self-pay | Admitting: *Deleted

## 2018-07-15 ENCOUNTER — Ambulatory Visit
Admission: RE | Admit: 2018-07-15 | Discharge: 2018-07-15 | Disposition: A | Discharge status: Home / Self Care | Payer: Medicare Other | Attending: Internal Medicine | Admitting: Internal Medicine

## 2018-07-15 DIAGNOSIS — D124 Benign neoplasm of descending colon: Secondary | ICD-10-CM | POA: Diagnosis not present

## 2018-07-15 DIAGNOSIS — R194 Change in bowel habit: Secondary | ICD-10-CM | POA: Diagnosis not present

## 2018-07-15 DIAGNOSIS — K449 Diaphragmatic hernia without obstruction or gangrene: Secondary | ICD-10-CM | POA: Diagnosis not present

## 2018-07-15 DIAGNOSIS — I2510 Atherosclerotic heart disease of native coronary artery without angina pectoris: Secondary | ICD-10-CM | POA: Diagnosis not present

## 2018-07-15 DIAGNOSIS — D123 Benign neoplasm of transverse colon: Secondary | ICD-10-CM | POA: Insufficient documentation

## 2018-07-15 DIAGNOSIS — E119 Type 2 diabetes mellitus without complications: Secondary | ICD-10-CM | POA: Diagnosis not present

## 2018-07-15 DIAGNOSIS — Z79899 Other long term (current) drug therapy: Secondary | ICD-10-CM | POA: Insufficient documentation

## 2018-07-15 DIAGNOSIS — Z794 Long term (current) use of insulin: Secondary | ICD-10-CM | POA: Insufficient documentation

## 2018-07-15 DIAGNOSIS — K5669 Other partial intestinal obstruction: Secondary | ICD-10-CM | POA: Diagnosis not present

## 2018-07-15 DIAGNOSIS — E7800 Pure hypercholesterolemia, unspecified: Secondary | ICD-10-CM | POA: Diagnosis not present

## 2018-07-15 DIAGNOSIS — I10 Essential (primary) hypertension: Secondary | ICD-10-CM | POA: Diagnosis not present

## 2018-07-15 DIAGNOSIS — C19 Malignant neoplasm of rectosigmoid junction: Secondary | ICD-10-CM | POA: Insufficient documentation

## 2018-07-15 DIAGNOSIS — K640 First degree hemorrhoids: Secondary | ICD-10-CM | POA: Insufficient documentation

## 2018-07-15 DIAGNOSIS — D509 Iron deficiency anemia, unspecified: Secondary | ICD-10-CM | POA: Diagnosis not present

## 2018-07-15 DIAGNOSIS — Z7982 Long term (current) use of aspirin: Secondary | ICD-10-CM | POA: Insufficient documentation

## 2018-07-15 DIAGNOSIS — Z8711 Personal history of peptic ulcer disease: Secondary | ICD-10-CM | POA: Diagnosis not present

## 2018-07-15 HISTORY — PX: COLONOSCOPY WITH PROPOFOL: SHX5780

## 2018-07-15 HISTORY — PX: ESOPHAGOGASTRODUODENOSCOPY (EGD) WITH PROPOFOL: SHX5813

## 2018-07-15 LAB — GLUCOSE, CAPILLARY: Glucose-Capillary: 126 mg/dL — ABNORMAL HIGH (ref 70–99)

## 2018-07-15 SURGERY — COLONOSCOPY WITH PROPOFOL
Anesthesia: General

## 2018-07-15 MED ORDER — GLYCOPYRROLATE 0.2 MG/ML IJ SOLN
INTRAMUSCULAR | Status: AC
  Filled 2018-07-15: qty 1

## 2018-07-15 MED ORDER — SODIUM CHLORIDE 0.9 % IV SOLN
INTRAVENOUS | Status: DC
  Administered 2018-07-15: 1000 mL via INTRAVENOUS

## 2018-07-15 MED ORDER — GLYCOPYRROLATE 0.2 MG/ML IJ SOLN
INTRAMUSCULAR | Status: DC | PRN
  Administered 2018-07-15: 0.2 mg via INTRAVENOUS

## 2018-07-15 MED ORDER — PROPOFOL 500 MG/50ML IV EMUL
INTRAVENOUS | Status: AC
  Filled 2018-07-15: qty 50

## 2018-07-15 MED ORDER — PROPOFOL 500 MG/50ML IV EMUL
INTRAVENOUS | Status: DC | PRN
  Administered 2018-07-15: 150 ug/kg/min via INTRAVENOUS

## 2018-07-15 MED ORDER — PROPOFOL 10 MG/ML IV BOLUS
INTRAVENOUS | Status: DC | PRN
  Administered 2018-07-15: 80 mg via INTRAVENOUS

## 2018-07-15 MED ORDER — LIDOCAINE HCL (PF) 2 % IJ SOLN
INTRAMUSCULAR | Status: AC
  Filled 2018-07-15: qty 10

## 2018-07-15 MED ORDER — LIDOCAINE HCL (CARDIAC) PF 100 MG/5ML IV SOSY
PREFILLED_SYRINGE | INTRAVENOUS | Status: DC | PRN
  Administered 2018-07-15: 100 mg via INTRAVENOUS

## 2018-07-15 MED ORDER — SPOT INK MARKER SYRINGE KIT
PACK | SUBMUCOSAL | Status: DC | PRN
  Administered 2018-07-15: 15 mL via SUBMUCOSAL

## 2018-07-15 NOTE — Anesthesia Post-op Follow-up Note (Signed)
Anesthesia QCDR form completed.        

## 2018-07-15 NOTE — Op Note (Signed)
[facility] Gastroenterology Patient Name: Willie [name] Procedure Date: 07/15/2018 8:17 AM MRN: [id number] Account #: [id number] Date of Birth: February 06, 1941 Admit Type: Outpatient Age: 78 Room: [facility] ENDO ROOM 4 Gender: Male Note Status: Finalized Procedure:            Upper GI endoscopy Indications:          Iron deficiency anemia, Follow-up of gastric ulcer Providers:            [name]. Alice Reichert MD, MD Referring MD:         [name], MD (Referring MD) Medicines:            Propofol per Anesthesia Complications:        No immediate complications. Procedure:            Pre-Anesthesia Assessment:                       - The risks and benefits of the procedure and the                        sedation options and risks were discussed with the                        patient. All questions were answered and informed                        consent was obtained.                       - Patient identification and proposed procedure were                        verified prior to the procedure by the nurse. The                        procedure was verified in the procedure room.                       - ASA Grade Assessment: III - A patient with severe                        systemic disease.                       - After reviewing the risks and benefits, the patient                        was deemed in satisfactory condition to undergo the                        procedure.                       After obtaining informed consent, the endoscope was                        passed under direct vision. Throughout the procedure,                        the patient's blood pressure, pulse, and oxygen  saturations were monitored continuously. The Endoscope                        was introduced through the mouth, and advanced to the                        third part of duodenum. The upper GI endoscopy was                        accomplished without  difficulty. The patient tolerated                        the procedure well. Findings:      The examined esophagus was normal.      The entire examined stomach was normal.      A 2 cm hiatal hernia was present.      The examined duodenum was normal. Impression:           - Normal esophagus.                       - Normal stomach.                       - 2 cm hiatal hernia.                       - Normal examined duodenum.                       - No specimens collected. Recommendation:       - Proceed with colonoscopy Procedure Code(s):    --- Professional ---                       [phone], Esophagogastroduodenoscopy, flexible, transoral;                        diagnostic, including collection of specimen(s) by                        brushing or washing, when performed (separate procedure) Diagnosis Code(s):    --- Professional ---                       K25.9, Gastric ulcer, unspecified as acute or chronic,                        without hemorrhage or perforation                       D50.9, Iron deficiency anemia, unspecified                       K44.9, Diaphragmatic hernia without obstruction or                        gangrene CPT copyright 2019 [employer]. All rights reserved. The codes documented in this report are preliminary and upon coder review may  be revised to meet current compliance requirements. Efrain Sella MD, MD 07/15/2018 8:58:40 AM This report has been signed electronically. Number of Addenda: 0 Note Initiated On: 07/15/2018 8:17 AM      [facility]

## 2018-07-15 NOTE — Anesthesia Preprocedure Evaluation (Addendum)
Anesthesia Evaluation  Patient identified by MRN, date of birth, ID band Patient awake    Reviewed: Allergy & Precautions, H&P , NPO status , Patient's Chart, lab work & pertinent test results, reviewed documented beta blocker date and time   History of Anesthesia Complications Negative for: history of anesthetic complications  Airway Mallampati: IV  TM Distance: >3 FB Neck ROM: full    Dental  (+) Partial Lower, Dental Advidsory Given, Missing   Pulmonary neg pulmonary ROS,           Cardiovascular Exercise Tolerance: Good hypertension, (-) angina+ CAD and + Cardiac Stents  (-) Past MI and (-) CABG (-) dysrhythmias (-) Valvular Problems/Murmurs     Neuro/Psych negative neurological ROS  negative psych ROS   GI/Hepatic negative GI ROS, Neg liver ROS,   Endo/Other  diabetes  Renal/GU negative Renal ROS  negative genitourinary   Musculoskeletal   Abdominal   Peds  Hematology negative hematology ROS (+)   Anesthesia Other Findings Past Medical History: No date: Coronary artery disease No date: Diabetes mellitus without complication (HCC) No date: Hypercholesterolemia No date: Hypertension   Reproductive/Obstetrics negative OB ROS                            Anesthesia Physical Anesthesia Plan  ASA: III  Anesthesia Plan: General   Post-op Pain Management:    Induction: Intravenous  PONV Risk Score and Plan: Propofol infusion and TIVA  Airway Management Planned: Natural Airway and Nasal Cannula  Additional Equipment:   Intra-op Plan:   Post-operative Plan:   Informed Consent: I have reviewed the patients History and Physical, chart, labs and discussed the procedure including the risks, benefits and alternatives for the proposed anesthesia with the patient or authorized representative who has indicated his/her understanding and acceptance.     Dental Advisory Given  Plan  Discussed with: Anesthesiologist, CRNA and Surgeon  Anesthesia Plan Comments:        Anesthesia Quick Evaluation

## 2018-07-15 NOTE — H&P (Signed)
Outpatient short stay form Pre-procedure 07/15/2018 8:37 AM [place] Mefferd K. Alice Reichert, M.D.  Primary Physician: Harrel Lemon, MD  Reason for visit:  Iron deficiency anemia, hematochezia, history of gastric ulcer, change in bowel habits  History of present illness: The patient is a pleasant 78 year old male with a history of type 2 diabetes mellitus, insulin anemia.  Patient has intermittent the patient which is recent.  Patient has remote history of gastric ulcer.  He has no other alarm signs such as weight loss, dysphagia abdominal pain.  His stools have become darker since starting supplemental iron.    Current Facility-Administered Medications:  .  0.9 %  sodium chloride infusion, , Intravenous, Continuous, [place], [name], MD, Last Rate: 20 mL/hr at 07/15/18 0800, 1,000 mL at 07/15/18 0800  Medications Prior to Admission  Medication Sig Dispense Refill Last Dose  . ezetimibe (ZETIA) 10 MG tablet Take 10 mg by mouth daily.   07/14/2018 at Unknown time  . HUMALOG MIX 75/25 KWIKPEN (75-25) 100 UNIT/ML Kwikpen Inject 35 Units into the skin every evening.    07/14/2018 at Unknown time  . lisinopril (PRINIVIL,ZESTRIL) 5 MG tablet Take 5 mg by mouth daily.   07/15/2018 at 0400  . metoprolol succinate (TOPROL-XL) 50 MG 24 hr tablet Take 50 mg by mouth daily.   07/15/2018 at 0400  . niacin (NIASPAN) 500 MG CR tablet Take 500 mg by mouth every evening.    Past Week at Unknown time  . pantoprazole (PROTONIX) 40 MG tablet Take 40 mg by mouth daily.   07/14/2018 at Unknown time  . traZODone (DESYREL) 50 MG tablet Take 50 mg by mouth at bedtime.   Past Week at Unknown time  . aspirin EC 81 MG tablet Take 81 mg by mouth every evening.   Not Taking at Unknown time     No Known Allergies   Past Medical History:  Diagnosis Date  . Coronary artery disease   . Diabetes mellitus without complication ([place])   . Hypercholesterolemia   . Hypertension     Review of systems:  Otherwise negative.    Physical  Exam  Gen: Alert, oriented. Appears stated age.  HEENT: [place]/AT. PERRLA. Lungs: CTA, no wheezes. CV: RR nl S1, S2. Abd: soft, benign, no masses. BS+ Ext: No edema. Pulses 2+    Planned procedures: Proceed with EGD and colonoscopy. The patient understands the nature of the planned procedure, indications, risks, alternatives and potential complications including but not limited to bleeding, infection, perforation, damage to internal organs and possible oversedation/side effects from anesthesia. The patient agrees and gives consent to proceed.  Please refer to procedure notes for findings, recommendations and patient disposition/instructions.     Kaelin Bonelli K. Alice Reichert, M.D. Gastroenterology 07/15/2018  8:37 AM

## 2018-07-15 NOTE — Op Note (Signed)
[facility] Gastroenterology Patient Name: Willie [name] Procedure Date: 07/15/2018 8:16 AM MRN: [id number] Account #: [id number] Date of Birth: 01-23-1941 Admit Type: Outpatient Age: 78 Room: [facility] ENDO ROOM 4 Gender: Male Note Status: Finalized Procedure:            Colonoscopy Indications:          Iron deficiency anemia Providers:            [name]. Alice Reichert MD, MD Referring MD:         [name], MD (Referring MD) Medicines:            Propofol per Anesthesia Complications:        No immediate complications. Procedure:            Pre-Anesthesia Assessment:                       - The risks and benefits of the procedure and the                        sedation options and risks were discussed with the                        patient. All questions were answered and informed                        consent was obtained.                       - Patient identification and proposed procedure were                        verified prior to the procedure by the nurse. The                        procedure was verified in the procedure room.                       - ASA Grade Assessment: III - A patient with severe                        systemic disease.                       After obtaining informed consent, the colonoscope was                        passed under direct vision. Throughout the procedure,                        the patient's blood pressure, pulse, and oxygen                        saturations were monitored continuously. The                        Colonoscope was introduced through the anus and                        advanced to the the cecum, identified by appendiceal  orifice and ileocecal valve. The colonoscopy was                        somewhat difficult due to a partially obstructing mass.                        Successful completion of the procedure was aided by                        withdrawing and reinserting the  scope. Findings:      The perianal and digital rectal examinations were normal. Pertinent       negatives include normal sphincter tone and no palpable rectal lesions.      Two sessile polyps were found in the descending colon and transverse       colon. The polyps were 4 to 7 mm in size. These polyps were removed with       a cold snare. Resection and retrieval were complete.      A frond-like/villous partially obstructing medium-sized mass was found       in the mid sigmoid colon. The mass was partially circumferential       (involving two-thirds of the lumen circumference). The mass measured       five cm in length. In addition, its diameter measured eighteen mm. No       bleeding was present. Biopsies were taken with a cold forceps for       histology. Area was successfully injected with 8 mL Spot (carbon black)       for tattooing. about 1cc in each quadrant 5cm proximal and distal the       mass.      An infiltrative partially obstructing medium-sized mass was found in the       recto-sigmoid colon. The mass was circumferential. The mass measured       five cm in length. In addition, its diameter measured twelve mm. No       bleeding was present. Biopsies were taken with a cold forceps for       histology. Area was successfully injected with 8 mL Spot (carbon black)       for tattooing.      Non-bleeding internal hemorrhoids were found during retroflexion. The       hemorrhoids were Grade I (internal hemorrhoids that do not prolapse).      The exam was otherwise without abnormality.      Multiple small and large-mouthed diverticula were found in the left       colon. Impression:           - Two 4 to 7 mm polyps in the descending colon and in                        the transverse colon, removed with a cold snare.                        Resected and retrieved.                       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  Biopsied. Injected.                       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 Biopsied. Injected.                       - Non-bleeding internal hemorrhoids.                       - The examination was otherwise normal. Recommendation:       - Patient has a contact number available for                        emergencies. The signs and symptoms of potential                        delayed complications were discussed with the patient.                        Return to normal activities tomorrow. Written discharge                        instructions were provided to the patient.                       - Resume previous diet.                       - Continue present medications.                       - Refer to an oncologist at the next available                        appointment.                       - The findings and recommendations were discussed with                        the patient. Procedure Code(s):    --- Professional ---                       [phone], Colonoscopy, flexible; with removal of tumor(s),                        polyp(s), or other lesion(s) by snare technique                       45381, Colonoscopy, flexible; with directed submucosal                        injection(s), any substance                       60454, 59, Colonoscopy, flexible; with biopsy, single                        or multiple Diagnosis Code(s):    --- Professional ---                       K63.5, Polyp of colon  D49.0, Neoplasm of unspecified behavior of digestive                        system                       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 All rights reserved. The codes  documented in this report are preliminary and upon coder review may  be revised to meet current compliance requirements. Efrain Sella MD, MD 07/15/2018 9:39:09 AM This report has been signed electronically. Number of Addenda: 0 Note Initiated On: 07/15/2018 8:16 AM Scope Withdrawal Time: 0 hours 19 minutes 8 seconds  Total Procedure Duration: 0 hours 23 minutes 57 seconds       [facility]

## 2018-07-15 NOTE — Anesthesia Postprocedure Evaluation (Signed)
Anesthesia Post Note  Patient: Willie [name]  Procedure(s) Performed: COLONOSCOPY WITH PROPOFOL (N/A ) ESOPHAGOGASTRODUODENOSCOPY (EGD) WITH PROPOFOL (N/A )  Patient location during evaluation: Endoscopy Anesthesia Type: General Level of consciousness: awake and alert Pain management: pain level controlled Vital Signs Assessment: post-procedure vital signs reviewed and stable Respiratory status: spontaneous breathing, nonlabored ventilation, respiratory function stable and patient connected to nasal cannula oxygen Cardiovascular status: blood pressure returned to baseline and stable Postop Assessment: no apparent nausea or vomiting Anesthetic complications: no     Last Vitals:  Vitals:   07/15/18 0939 07/15/18 0949  BP: 110/71 128/80  Pulse: 74 71  Resp: 14 17  Temp:    SpO2: 100% 99%    Last Pain:  Vitals:   07/15/18 0949  TempSrc:   PainSc: 0-No pain                 Martha Clan

## 2018-07-15 NOTE — Transfer of Care (Signed)
Immediate Anesthesia Transfer of Care Note  Patient: Willie [name]  Procedure(s) Performed: COLONOSCOPY WITH PROPOFOL (N/A ) ESOPHAGOGASTRODUODENOSCOPY (EGD) WITH PROPOFOL (N/A )  Patient Location: Endoscopy Unit  Anesthesia Type:General  Level of Consciousness: sedated  Airway & Oxygen Therapy: Patient Spontanous Breathing and Patient connected to nasal cannula oxygen  Post-op Assessment: Report given to RN and Post -op Vital signs reviewed and stable  Post vital signs: Reviewed and stable  Last Vitals:  Vitals Value Taken Time  BP 96/63 07/15/2018  9:29 AM  Temp 36 C 07/15/2018  9:29 AM  Pulse 80 07/15/2018  9:30 AM  Resp 17 07/15/2018  9:30 AM  SpO2 100 % 07/15/2018  9:30 AM  Vitals shown include unvalidated device data.  Last Pain:  Vitals:   07/15/18 0929  TempSrc: Tympanic         Complications: No apparent anesthesia complications

## 2018-07-15 NOTE — Interval H&P Note (Signed)
History and Physical Interval Note:  07/15/2018 8:40 AM  Willie [name]  has presented today for surgery, with the diagnosis of IDA.  The various methods of treatment have been discussed with the patient and family. After consideration of risks, benefits and other options for treatment, the patient has consented to  Procedure(s): COLONOSCOPY WITH PROPOFOL (N/A) ESOPHAGOGASTRODUODENOSCOPY (EGD) WITH PROPOFOL (N/A) as a surgical intervention.  The patient's history has been reviewed, patient examined, no change in status, stable for surgery.  I have reviewed the patient's chart and labs.  Questions were answered to the patient's satisfaction.     [place], [place]

## 2018-07-16 ENCOUNTER — Other Ambulatory Visit: Payer: Self-pay | Admitting: Anatomic Pathology & Clinical Pathology

## 2018-07-16 ENCOUNTER — Encounter: Payer: Self-pay | Admitting: Internal Medicine

## 2018-07-16 LAB — SURGICAL PATHOLOGY

## 2018-07-20 ENCOUNTER — Ambulatory Visit: Payer: Self-pay | Admitting: General Surgery

## 2018-07-20 ENCOUNTER — Other Ambulatory Visit: Payer: Self-pay | Admitting: General Surgery

## 2018-07-20 DIAGNOSIS — K6389 Other specified diseases of intestine: Secondary | ICD-10-CM

## 2018-07-20 NOTE — H&P (Signed)
PATIENT PROFILE: Willie [name] is a 78 y.o. male who presents to the Clinic for consultation at the request of Dr. Alice [name] for evaluation of colon cancer.  PCP:  Willie Ochs, MD  HISTORY OF PRESENT ILLNESS: Mr. Willie [name] reports rectal bleeding since 2 months ago.  He was seen by his primary care physician and he was found with anemia.  He was referred to GI for endoscopic evaluation of rectal bleeding.  Patient had colonoscopy done last week which he was found to have 2 masses around the rectosigmoid junction and one proximally in the sigmoid colon.  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  The proximal sigmoid mass was found with tubulovillous adenoma with high-grade dysplasia.  Patient reports constipation and bleeding on stool.  He also reports associated weight loss.  He denies any family history of colon cancer.  He reports that he last colonoscopy was 10 years before.  He denies pain.  There is no pain radiation.  Patient main issue is rectal bleeding that is aggravated by constipation and bowel movement.  There is no alleviating factor.  Patient has a recent stool softener without any improvement.  Patient has history of prostatectomy   PROBLEM LIST:         Problem List  Date Reviewed: 07/03/2018         Noted   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   History of gastric ulcer 07/07/2018   Controlled type 2 diabetes mellitus without complication, with long-term current use of insulin (CMS-HCC) 09/16/2017   Essential hypertension 09/16/2017   Coronary artery disease involving native heart without angina pectoris 09/16/2017   Benign prostatic hyperplasia 09/16/2017   Overview    S/P TURP      Dermatophytosis of nail 05/28/2013   Hypercholesteremia Unknown      GENERAL REVIEW OF SYSTEMS:   General ROS: negative for - chills, fatigue, fever, weight gain or weight loss Allergy and Immunology ROS: negative  for - hives  Hematological and Lymphatic ROS: negative for - bleeding problems or bruising, negative for palpable nodes Endocrine ROS: negative for - heat or cold intolerance, hair changes Respiratory ROS: negative for - cough, shortness of breath or wheezing Cardiovascular ROS: no chest pain or palpitations GI ROS: negative for nausea, vomiting, abdominal pain, diarrhea, constipation Musculoskeletal ROS: negative for - joint swelling or muscle pain Neurological ROS: negative for - confusion, syncope Dermatological ROS: negative for pruritus and rash Psychiatric: negative for anxiety, depression, difficulty sleeping and memory loss  MEDICATIONS: CurrentMedications        Current Outpatient Medications  Medication Sig Dispense Refill  . ferrous sulfate 325 (65 FE) MG EC tablet Take 1 tablet (325 mg total) by mouth daily with breakfast 90 tablet 1  . flash glucose scanning reader (FREESTYLE LIBRE 14 DAY READER) Misc Use 1 each as directed 1 each 0  . flash glucose sensor (FREESTYLE LIBRE 14 DAY SENSOR) Kit Use 1 kit 2 (two) times daily 6 each 1  . HUMALOG MIX 75-25 KWIKPEN 100 unit/mL (75-25) pen injector Inject 40 Units subcutaneously once daily 15 mL 6  . lisinopril (PRINIVIL,ZESTRIL) 5 MG tablet Take 1 tablet (5 mg total) by mouth once daily 90 tablet 3  . metoprolol succinate (TOPROL-XL) 50 MG XL tablet TAKE 1 TABLET BY MOUTH ONCE DAILY 90 tablet 3  . niacin (NIASPAN) 500 MG ER tablet Take 1 tablet (500 mg total) by mouth nightly 90 tablet 3  . pantoprazole (PROTONIX)  40 MG DR tablet Take 1 tablet (40 mg total) by mouth once daily 90 tablet 1  . traZODone (DESYREL) 50 MG tablet Take 1 tablet (50 mg total) by mouth nightly 90 tablet 1  . TRUEPLUS PEN NEEDLE 32 gauge x 5/32" Ndle USE AS DIRECTED. WITH PEN 100 each 1   No current facility-administered medications for this visit.       ALLERGIES: Patient has no known allergies.  PAST MEDICAL HISTORY:     Past Medical History:   Diagnosis Date  . Colon cancer (CMS-HCC) 07/2018  . Dermatophytosis of nail 05/28/2013  . GERD (gastroesophageal reflux disease)   . Hematoma of neck left  . Hypercholesteremia   . Hypertension   . Prostate cancer (CMS-HCC)   . Type II or unspecified type diabetes mellitus without mention of complication, not stated as uncontrolled (CMS-HCC) 05/28/2013    PAST SURGICAL HISTORY:      Past Surgical History:  Procedure Laterality Date  . COLONOSCOPY  07/15/2018   Invasive colorectal adenocarcinoma/High grade dysplasiain an adenomatous lesion with villous features/Tubular adenoma  . CORONARY ANGIOPLASTY    . EGD  07/15/2018   Normal EGD/Hiatal hernia     FAMILY HISTORY: Family History  Family history unknown: Yes     SOCIAL HISTORY: Social History          Socioeconomic History  . Marital status: Married    Spouse name: Not on file  . Number of children: Not on file  . Years of education: Not on file  . Highest education level: Not on file  Occupational History  . Not on file  Social Needs  . Financial resource strain: Not on file  . Food insecurity:    Worry: Not on file    Inability: Not on file  . Transportation needs:    Medical: Not on file    Non-medical: Not on file  Tobacco Use  . Smoking status: Never Smoker  . Smokeless tobacco: Never Used  Substance and Sexual Activity  . Alcohol use: No  . Drug use: No  . Sexual activity: Defer  Other Topics Concern  . Not on file  Social History Narrative  . Not on file      PHYSICAL EXAM:    Vitals:   07/20/18 1321  BP: 175/80  Pulse: 89   Body mass index is 29.87 kg/m. Weight: 78.9 kg (174 lb)   GENERAL: Alert, active, oriented x3  HEENT: Pupils equal reactive to light. Extraocular movements are intact. Sclera clear. Palpebral conjunctiva normal red color.Pharynx clear.  NECK: Supple with no palpable mass and no adenopathy.  LUNGS: Sound clear with no rales  rhonchi or wheezes.  HEART: Regular rhythm S1 and S2 without murmur.  ABDOMEN: Soft and depressible, nontender with no palpable mass, no hepatomegaly.   EXTREMITIES: Well-developed well-nourished symmetrical with no dependent edema.  NEUROLOGICAL: Awake alert oriented, facial expression symmetrical, moving all extremities.  REVIEW OF DATA: I have reviewed the following data today:      Office Visit on 07/03/2018  Component Date Value  . WBC (White Blood Cell Co* 07/03/2018 4.3   . RBC (Red Blood Cell Coun* 07/03/2018 4.76   . Hemoglobin 07/03/2018 12.3*  . Hematocrit 07/03/2018 38.3*  . MCV (Mean Corpuscular Vo* 07/03/2018 80.5   . MCH (Mean Corpuscular He* 07/03/2018 25.8*  . MCHC (Mean Corpuscular H* 07/03/2018 32.1   . Platelet Count 07/03/2018 264   . RDW-CV (Red Cell Distrib* 07/03/2018 20.2*  . MPV (Mean  Platelet Volum* 07/03/2018 8.6*  . Neutrophils 07/03/2018 2.24   . Lymphocytes 07/03/2018 1.44   . Monocytes 07/03/2018 0.34   . Eosinophils 07/03/2018 0.25   . Basophils 07/03/2018 0.05   . Neutrophil % 07/03/2018 51.6   . Lymphocyte % 07/03/2018 33.3   . Monocyte % 07/03/2018 7.9   . Eosinophil % 07/03/2018 5.8*  . Basophil% 07/03/2018 1.2   . Immature Granulocyte % 07/03/2018 0.2   . Immature Granulocyte Cou* 07/03/2018 0.01   . Glucose 07/03/2018 143*  . Sodium 07/03/2018 139   . Potassium 07/03/2018 4.5   . Chloride 07/03/2018 106   . Carbon Dioxide (CO2) 07/03/2018 24.3   . Urea Nitrogen (BUN) 07/03/2018 13   . Creatinine 07/03/2018 1.3   . Glomerular Filtration Ra* 07/03/2018 64   . Calcium 07/03/2018 9.2   . AST  07/03/2018 15   . ALT  07/03/2018 14   . Alk Phos (alkaline Phosp* 07/03/2018 78   . Albumin 07/03/2018 3.7   . Bilirubin, Total 07/03/2018 0.3   . Protein, Total 07/03/2018 6.4   . A/G Ratio 07/03/2018 1.4   . Iron 07/03/2018 42*  . Total Iron Binding Capac* 07/03/2018 365.0   . Transferrin 07/03/2018 260.7   . % Saturation  07/03/2018 12   . Ferritin 07/03/2018 14*     ASSESSMENT: Mr. Sermersheim is a 78 y.o. male presenting for consultation for colon cancer.  Patient was found with 2 synchronous masses 1 in the rectosigmoid junction and one in the proximal segment.  Rectosigmoid mass was found with cancer in the proximal segment with high-grade dysplasia.  Patient with associated weight loss and rectal bleeding with anemia.  Patient was oriented about the diagnosis of colon cancer which was already notified by Dr. Alice [name].  He was oriented about the preoperative evaluation for staging, he was also oriented about the surgical management including the partial colectomy with anastomosis and the possibility of having a loop ileostomy for protection of the anastomosis.  This decision will be taken during the surgery depending on how distal is the anastomosis from the descending colon to the rectum.  Patient has history of diabetes and hypertension.  Patient also with coronary artery disease followed by Dr. Clayborn Bigness.  Patient has been stable in the past few years without any chest pain or shortness of breath.  There has not been any significant change in his medical management of cardiac disease.  He usually sees his cardiologist every year.  At the moment of my evaluation patient denied shortness of breath.  He reports that he is active walking around his neighborhood without any shortness of breath or chest pain.  My plan is to order a CEA, chest x-ray and abdominal pelvic CT scan for preoperative colon cancer staging.  I will also ask for cardiac clearance.  If everything comes back negative we will proceed with surgical management which will be laparoscopic and assistant colectomy with anastomosis with possible protective loop ileostomy.  Patient oriented that he will need to stay in the hospital for at least 3 to 4 days.  Patient was oriented about the possible routes of surgery including infection, bleeding, small bowel  obstruction, anastomosis leak, injury to the ureter and adjacent organs, need of subsequent surgeries, adhesions, abscess, among others.  Patient was also oriented about the risk of getting infected with COVID-19 virus.  Patient was oriented about other risk such as pneumonia, deep venous of both knees, cardiac complications among others.  Patient voiced that he  understood and consented to proceed with surgery.  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  Laparoscopic hand assisted sigmoid colectomy with low pelvic anastomosis ([id number], [id number]) 2.  CEA level 3.  CBC, CMP. 4.  CT scan of the abdominal therapy with IV contrast 5.  Chest x-ray 6.  Cardiology clearance by Dr. Clayborn Bigness 7.  Bowel prep the day before surgery 8.  Contact us if has any question or concern  Patient verbalized understanding, all questions were answered, and were agreeable with the plan outlined above.     [name], MD  Electronically signed by [name], MD

## 2018-07-23 ENCOUNTER — Encounter
Admission: RE | Admit: 2018-07-23 | Discharge: 2018-07-23 | Disposition: A | Discharge status: Home / Self Care | Payer: Medicare Other | Source: Ambulatory Visit | Attending: General Surgery | Admitting: General Surgery

## 2018-07-23 ENCOUNTER — Other Ambulatory Visit: Payer: Medicare Other

## 2018-07-23 ENCOUNTER — Ambulatory Visit
Admission: RE | Admit: 2018-07-23 | Discharge: 2018-07-23 | Disposition: A | Discharge status: Home / Self Care | Payer: Medicare Other | Source: Ambulatory Visit | Attending: General Surgery | Admitting: General Surgery

## 2018-07-23 ENCOUNTER — Other Ambulatory Visit: Payer: Self-pay

## 2018-07-23 DIAGNOSIS — K4090 Unilateral inguinal hernia, without obstruction or gangrene, not specified as recurrent: Secondary | ICD-10-CM | POA: Diagnosis not present

## 2018-07-23 DIAGNOSIS — K6389 Other specified diseases of intestine: Secondary | ICD-10-CM | POA: Insufficient documentation

## 2018-07-23 DIAGNOSIS — I10 Essential (primary) hypertension: Secondary | ICD-10-CM | POA: Diagnosis not present

## 2018-07-23 DIAGNOSIS — I2510 Atherosclerotic heart disease of native coronary artery without angina pectoris: Secondary | ICD-10-CM | POA: Insufficient documentation

## 2018-07-23 DIAGNOSIS — Z1159 Encounter for screening for other viral diseases: Secondary | ICD-10-CM | POA: Diagnosis not present

## 2018-07-23 HISTORY — DX: Personal history of other diseases of the digestive system: Z87.19

## 2018-07-23 HISTORY — DX: Anemia, unspecified: D64.9

## 2018-07-23 LAB — TYPE AND SCREEN
ABO/RH(D): A POS
Antibody Screen: POSITIVE

## 2018-07-23 MED ORDER — IOHEXOL 300 MG/ML  SOLN
100.0000 mL | Freq: Once | INTRAMUSCULAR | Status: AC | PRN
  Administered 2018-07-23: 10:00:00 100 mL via INTRAVENOUS

## 2018-07-23 NOTE — Pre-Procedure Instructions (Signed)
CARDIAC CLEARANCE ON CHART FROM [name]

## 2018-07-23 NOTE — Pre-Procedure Instructions (Signed)
Echo complete9/26/2019 [name] Component Name Value Ref Range  LV Ejection Fraction (%) 50   Aortic Valve Stenosis Grade none   Aortic Valve Regurgitation Grade trivial   Aortic Valve Max Velocity (m/s) 1.2 m/sec  Mitral Valve Stenosis Grade none   Mitral Valve Regurgitation Grade mild   Tricuspid Valve Regurgitation Grade mild   Tricuspid Valve Regurgitation Max Velocity (m/s) 2.6 m/sec  Right Ventricle Systolic Pressure (mmHg) 83.1 mmHg  LV End Diastolic Diameter (cm) 4.5 cm  LV End Systolic Diameter (cm) 3.3 cm  LV Septum Wall Thickness (cm) 1.2 cm  LV Posterior Wall Thickness (cm) 1.2 cm  Left Atrium Diameter (cm) 3.7 cm  Result Narrative            CARDIOLOGY DEPARTMENT          Willie, [name]                  [id number]      [facility]              Acct #: [id number]      [address] [name], [place] [address]    Date: 12/03/2017 09: 69 AM                                Adult  Male  Age: 78 yrs      ECHOCARDIOGRAM REPORT               Outpatient                                [name]    STUDY:CHEST WALL        TAPE:          MD1: [name], Willie DENNIS    ECHO:Yes  DOPPLER:Yes    FILE:          BP: 140/70 mmHg    COLOR:Yes  CONTRAST:No   MACHINE:Philips  RV BIOPSY:No     3D:No SOUND QLTY:Moderate      Height: 64 in   MEDIUM:None                       Weight: 188 lb                                BSA: 1.9 m2 _________________________________________________________________________________________        HISTORY: DOE         REASON: Assess, LV function       INDICATION: SOB (shortness of breath) [R06.02  (ICD-10-CM)] _________________________________________________________________________________________ ECHOCARDIOGRAPHIC MEASUREMENTS 2D DIMENSIONS AORTA         Values  Normal Range  MAIN PA     Values  Normal Range        Annulus: 1.8 cm    [2.3-2.9]     PA Main: nm*    [1.5-2.1]       Aorta Sin: 2.7 cm    [3.1-3.7]  RIGHT VENTRICLE      ST Junction: nm*     [2.6-3.2]     RV Base: nm*    [<4.2]       Asc.Aorta: nm*     [2.6-3.4]     RV Mid: 2.4 cm  [<3.5] LEFT VENTRICLE  RV Length: nm*    [<8.6]         LVIDd: 4.5 cm    [4.2-5.9]  INFERIOR VENA CAVA         LVIDs: 3.3 cm            Max. IVC: nm*    [<=2.1]           FS: 25.5 %    [>25]      Min. IVC: nm*          SWT: 1.2 cm    [0.6-1.0]  ------------------          PWT: 1.2 cm    [0.6-1.0]  nm* - not measured LEFT ATRIUM        LA Diam: 3.7 cm    [3.0-4.0]      LA A4C Area: nm*     [<20]       LA Volume: nm*     [18-58] _________________________________________________________________________________________ ECHOCARDIOGRAPHIC DESCRIPTIONS AORTIC ROOT          Size: Normal       Dissection: INDETERM FOR DISSECTION AORTIC VALVE        Leaflets: Tricuspid          Morphology: Normal        Mobility: Fully mobile LEFT VENTRICLE          Size: Normal            Anterior: Normal      Contraction: Normal             Lateral: Normal       Closest EF: 50% (Estimated)         Septal: Normal       LV Masses: No Masses            Apical: Normal          LVH: MILD LVH           Inferior: Normal                           Posterior: Normal      Dias.FxClass:  N/A MITRAL VALVE        Leaflets: Normal            Mobility: Fully mobile       Morphology: Normal LEFT ATRIUM          Size: Normal            LA Masses: No masses       IA Septum: Normal IAS MAIN PA          Size: Normal PULMONIC VALVE       Morphology: Normal            Mobility: Fully mobile RIGHT VENTRICLE       RV Masses: No Masses             Size: Normal       Free Wall: Normal           Contraction: Normal TRICUSPID VALVE        Leaflets: Normal            Mobility: Fully mobile       Morphology: Normal RIGHT ATRIUM          Size: Normal            RA Other: None        RA Mass: No masses PERICARDIUM  Fluid: No effusion INFERIOR VENACAVA          Size: Normal Normal respiratory collapse _________________________________________________________________________________________  DOPPLER ECHO and OTHER SPECIAL PROCEDURES         Aortic: TRIVIAL AR         No AS             117.0 cm/sec peak vel   5.5 mmHg peak grad         Mitral: MILD MR          No MS             3.2 cm^2 by DOPPLER             MV Inflow E Vel = 105.0 cm/sec   MV Annulus E'Vel = 6.3 cm/sec             E/E'Ratio = 16.7       Tricuspid: MILD TR          No TS             261.8 cm/sec peak TR vel  32.4 mmHg peak RV pressure       Pulmonary: No PR           No PS             84.2 cm/sec peak vel    2.8 mmHg peak grad _________________________________________________________________________________________ INTERPRETATION NORMAL LEFT VENTRICULAR SYSTOLIC FUNCTION  WITH MILD LVH NORMAL RIGHT VENTRICULAR SYSTOLIC FUNCTION MILD VALVULAR REGURGITATION (See above) NO VALVULAR STENOSIS MILD MR,  TR TRIVIAL AR EF 50% _________________________________________________________________________________________ Electronically signed by  Willie Amel, MD on 12/04/2017 05: 07 PM      Performed By: Willie [name], RDCS   Ordering Physician: Willie [name] _________________________________________________________________________________________  Other Result Information  Interface, Text Results In - 12/04/2017  5:07 PM EDT                     CARDIOLOGY DEPARTMENT                    [name]                                    [id number]           [facility]                           Acct #: [id number]           [address], [place], [place] [address]       Date: 12/03/2017 09: 35 AM                                                              Adult   Male   Age: 24 yrs           ECHOCARDIOGRAM REPORT                              Outpatient  [name]      STUDY:CHEST WALL               TAPE:                    MD1: [name], Willie DENNIS       ECHO:Yes    DOPPLER:Yes       FILE:                    BP: 140/70 mmHg      COLOR:Yes   CONTRAST:No     MACHINE:Philips  RV BIOPSY:No          3D:No  SOUND QLTY:Moderate            Height: 64 in     MEDIUM:None                                              Weight: 188 lb                                                              BSA: 1.9 m2 _________________________________________________________________________________________               HISTORY: DOE                REASON: Assess, LV function            INDICATION: SOB (shortness of breath) [R06.02 (ICD-10-CM)] _________________________________________________________________________________________ ECHOCARDIOGRAPHIC MEASUREMENTS 2D DIMENSIONS AORTA                  Values   Normal Range   MAIN PA         Values    Normal Range               Annulus: 1.8 cm        [2.3-2.9]         PA Main: nm*       [1.5-2.1]             Aorta Sin: 2.7 cm       [3.1-3.7]    RIGHT VENTRICLE           ST Junction: nm*          [2.6-3.2]         RV Base: nm*       [<4.2]             Asc.Aorta: nm*          [2.6-3.4]          RV Mid: 2.4 cm    [<3.5] LEFT VENTRICLE                                      RV Length: nm*       [<8.6]                 LVIDd: 4.5 cm       [4.2-5.9]    INFERIOR VENA CAVA                 LVIDs: 3.3 cm  Max. IVC: nm*       [<=2.1]                    FS: 25.5 %       [>25]            Min. IVC: nm*                   SWT: 1.2 cm       [0.6-1.0]    ------------------                   PWT: 1.2 cm       [0.6-1.0]    nm* - not measured LEFT ATRIUM               LA Diam: 3.7 cm       [3.0-4.0]           LA A4C Area: nm*          [<20]             LA Volume: nm*          [18-58] _________________________________________________________________________________________ ECHOCARDIOGRAPHIC DESCRIPTIONS AORTIC ROOT                  Size: Normal            Dissection: INDETERM FOR DISSECTION AORTIC VALVE              Leaflets: Tricuspid                   Morphology: Normal              Mobility: Fully mobile LEFT VENTRICLE                  Size: Normal                        Anterior: Normal           Contraction: Normal                         Lateral: Normal            Closest EF: 50% (Estimated)                 Septal: Normal             LV Masses: No Masses                       Apical: Normal                   LVH: MILD LVH                      Inferior: Normal                                                     Posterior: Normal          Dias.FxClass: N/A MITRAL VALVE              Leaflets: Normal                        Mobility: Fully mobile  Morphology: Normal LEFT ATRIUM                  Size: Normal                       LA Masses: No masses             IA Septum: Normal IAS MAIN PA                  Size:  Normal PULMONIC VALVE            Morphology: Normal                        Mobility: Fully mobile RIGHT VENTRICLE             RV Masses: No Masses                         Size: Normal             Free Wall: Normal                     Contraction: Normal TRICUSPID VALVE              Leaflets: Normal                        Mobility: Fully mobile            Morphology: Normal RIGHT ATRIUM                  Size: Normal                        RA Other: None               RA Mass: No masses PERICARDIUM                 Fluid: No effusion INFERIOR VENACAVA                  Size: Normal Normal respiratory collapse _________________________________________________________________________________________  DOPPLER ECHO and OTHER SPECIAL PROCEDURES                Aortic: TRIVIAL AR                 No AS                        117.0 cm/sec peak vel      5.5 mmHg peak grad                Mitral: MILD MR                    No MS                        3.2 cm^2 by DOPPLER                        MV Inflow E Vel = 105.0 cm/sec      MV Annulus E'Vel = 6.3 cm/sec                        E/E'Ratio = 16.7             Tricuspid:  MILD TR                    No TS                        261.8 cm/sec peak TR vel   32.4 mmHg peak RV pressure             Pulmonary: No PR                      No PS                        84.2 cm/sec peak vel       2.8 mmHg peak grad _________________________________________________________________________________________ INTERPRETATION NORMAL LEFT VENTRICULAR SYSTOLIC FUNCTION   WITH MILD LVH NORMAL RIGHT VENTRICULAR SYSTOLIC FUNCTION MILD VALVULAR REGURGITATION (See above) NO VALVULAR STENOSIS MILD MR, TR TRIVIAL AR EF 50% _________________________________________________________________________________________ Electronically signed by    Willie Amel, MD on 12/04/2017 05: 07 PM          Performed By: Willie [name], [place]    Ordering Physician: Willie [name] _________________________________________________________________________________________  Status Results Details   Encounter Summary  NM myocardial perfusion SPECT multiple (stress and rest)12/04/2017 [place] myocardial perfusion SPECT multiple (stress and rest)12/04/2017 [place] Result Impression   Normal myocardial perfusion scan no evidence of stress-induced  myocardial-ischemia ejection fraction of 57% conclusion negative scan  Result Narrative  CARDIOLOGY DEPARTMENT [address], [place], [place] [address] [phone]  Procedure: Pharmacologic Myocardial Perfusion Imaging  ONE day procedure  Indication: Angina at rest (CMS-HCC) Plan: NM myocardial perfusion SPECT multiple (stress     and rest), ECG stress test only  SOB (shortness of breath) Plan: NM myocardial perfusion SPECT multiple (stress     and rest), ECG stress test only  Ordering Physician:   Dr. Lujean [name]   Clinical History: 78 y.o. year old male recent anginal symptoms known coronary artery  disease Vitals: Height: 64 in Weight: 188 lb Cardiac risk factors include:   Hyperlipidemia, Diabetes, HTN and CAD    Procedure:  Pharmacologic stress testing was performed with Regadenoson using a single  use 0.4mg /11ml (0.08 mg/ml) prefilled syringe intravenously infused as a  bolus dose. The stress test was stopped due to Infusion completion. Blood  pressure response was normal. The patient did not develop any symptoms  other than fatigue during the procedure.   Rest HR: 68bpm Rest BP: 138/84mmHg Max HR: 101bpm Min BP: 122/16mmHg  Stress Test Administered by: Oswald Hillock, CMA  ECG Interpretation: Rest ECG: normal sinus rhythm, none Stress ECG: normal sinus rhythm,  Recovery ECG: normal sinus rhythm ECG Interpretation: non-diagnostic due to pharmacologic  testing.   Administrations This Visit   regadenoson (LEXISCAN) 0.4 mg/5 mL inj syringe 0.4 mg   Admin Date 12/03/2017 [place] Given Dose 0.4 mg Route Intravenous Administered By Herbert Seta, CNMT     technetium Tc45m sestamibi (CARDIOLITE) injection 84.16 millicurie   Admin Date 12/03/2017 [place] Given Dose 60.63 millicurie Route Intravenous Administered By Herbert Seta, CNMT     technetium Tc41m sestamibi (CARDIOLITE) injection 01.60 millicurie   Admin Date 12/03/2017 [place] Given Dose 10.93 millicurie Route Intravenous Administered By Herbert Seta, CNMT       Gated post-stress perfusion imaging was performed 30 minutes after stress.  Rest images were performed 30 minutes after  injection.  Gated LV Analysis:  TID Ratio: 1.10  LVEF= 57 %  FINDINGS: Regional wall motion: reveals normal myocardial thickening and wall  motion. The overall quality of the study is good.  Artifacts noted: no Left ventricular cavity: normal.  Perfusion Analysis: SPECT images demonstrate homogeneous tracer  distribution throughout the myocardium.   Status Results Details   Encounter Summary  ECG stress test only9/25/2019 [place] ECG stress test only9/25/2019 [place] Result Narrative  This result has an attachment that is not available.  Status Results Details   Encounter Summary  Date Index Date Index 2020 2020 May 2019 2019 September Result Index Result Index ECG stress test only ECG stress test only 12/03/2017 Echo complete Echo complete 12/04/2017 ENDOSCOPY - UPPER EXTERNAL ENDOSCOPY - UPPER EXTERNAL 07/15/2018 EXTERNAL COLONOSCOPY RESULT EXTERNAL COLONOSCOPY RESULT 07/15/2018 NM myocardial perfusion SPECT multiple (stress and rest) NM myocardial perfusion SPECT multiple (stress and rest) 12/04/2017 X-ray chest PA and lateral X-ray chest PA and lateral 07/20/2018

## 2018-07-23 NOTE — Progress Notes (Signed)
Tumor Board Documentation  Willie [name] was presented by Dr Reuel Derby at our Tumor Board on 07/23/2018, which included representatives from medical oncology, radiation oncology, surgical oncology, surgical, radiology, pathology, navigation, internal medicine, genetics, research, palliative care.  Willie [name] currently presents for discussion, for [place], for new positive pathology with history of the following treatments: surgical intervention(s).  Additionally, we reviewed previous medical and familial history, history of present illness, and recent lab results along with all available histopathologic and imaging studies. The tumor board considered available treatment options and made the following recommendations: Surgery, Additional screening, Concurrent chemo-radiation therapy referral to Medical and Radiation Oncology if he has Rectal cancer. MRI Rectum.   The following procedures/referrals were also placed: No orders of the defined types were placed in this encounter.   Clinical Trial Status: not discussed   Staging used: AJCC Stage Group  AJCC Staging:       Group: Invasive Colorectal Cancer   National site-specific guidelines NCCN were discussed with respect to the case.  Tumor board is a meeting of clinicians from various specialty areas who evaluate and discuss patients for whom a multidisciplinary approach is being considered. Final determinations in the plan of care are those of the provider(s). The responsibility for follow up of recommendations given during tumor board is that of the provider.   Today's extended care, comprehensive team conference, Willie [name] was not present for the discussion and was not examined.   Multidisciplinary Tumor Board is a multidisciplinary case peer review process.  Decisions discussed in the Multidisciplinary Tumor Board reflect the opinions of the specialists present at the conference without having examined the patient.  Ultimately, treatment and  diagnostic decisions rest with the primary provider(s) and the patient.

## 2018-07-23 NOTE — Patient Instructions (Signed)
Your procedure is scheduled on: 07-27-18 MONDAY Report to Same Day Surgery 2nd floor medical mall [facility] Entrance-take elevator on left to 2nd floor.  Check in with surgery information desk.) To find out your arrival time please call [phone] between 1PM - 3PM on 07-24-18 FRIDAY  Remember: Instructions that are not followed completely may result in serious medical risk, up to and including death, or upon the discretion of your surgeon and anesthesiologist your surgery may need to be rescheduled.    _x___ 1. Do not eat food after midnight the night before your procedure. NO GUM OR CANDY AFTER SURGERY. You may drink WATER up to 2 hours before you are scheduled to arrive at the hospital for your procedure.  Do not drink WATER within 2 hours of your scheduled arrival to the hospital.  Type 1 and type 2 diabetics should only drink water.   ____Ensure clear carbohydrate drink on the way to the hospital for bariatric patients  ____Ensure clear carbohydrate drink 3 hours before surgery for Dr Dwyane Luo patients if physician instructed.     __x__ 2. No Alcohol for 24 hours before or after surgery.   __x__3. No Smoking or e-cigarettes for 24 prior to surgery.  Do not use any chewable tobacco products for at least 6 hour prior to surgery   ____  4. Bring all medications with you on the day of surgery if instructed.    __x__ 5. Notify your doctor if there is any change in your medical condition     (cold, fever, infections).    x___6. On the morning of surgery brush your teeth with toothpaste and water.  You may rinse your mouth with mouth wash if you wish.  Do not swallow any toothpaste or mouthwash.   Do not wear jewelry, make-up, hairpins, clips or nail polish.  Do not wear lotions, powders, or perfumes. You may wear deodorant.  Do not shave 48 hours prior to surgery. Men may shave face and neck.  Do not bring valuables to the hospital.    [facility] is not responsible for any  belongings or valuables.               Contacts, dentures or bridgework may not be worn into surgery.  Leave your suitcase in the car. After surgery it may be brought to your room.  For patients admitted to the hospital, discharge time is determined by your treatment team.  _  Patients discharged the day of surgery will not be allowed to drive home.  You will need someone to drive you home and stay with you the night of your procedure.    Please read over the following fact sheets that you were given:   [facility] Preparing for Surgery  _x___ TAKE THE FOLLOWING MEDICATION THE MORNING OF SURGERY WITH A SMALL SIP OF WATER. These include:  1. METOPROLOL  2. PROTONIX (PANTOPRAZOLE)  3. TAKE AN EXTRA PROTONIX THE NIGHT BEFORE YOUR SURGERY  4.  5.  6.  ____Fleets enema or Magnesium Citrate as directed.   _x___ Use CHG Soap or sage wipes as directed on instruction sheet   ____ Use inhalers on the day of surgery and bring to hospital day of surgery  ____ Stop Metformin and Janumet 2 days prior to surgery.    _X___ Take 1/2 of usual insulin dose the night before surgery and none on the morning surgery-NO INSULIN THE MORNING OF SURGERY  ____ Follow recommendations from Cardiologist, Pulmonologist or PCP  regarding stopping Aspirin, Coumadin, Plavix ,Eliquis, Effient, or Pradaxa, and Pletal.  X____Stop Anti-inflammatories such as Advil, Aleve, Ibuprofen, Motrin, Naproxen, Naprosyn, Goodies powders or aspirin products NOW-OK to take Tylenol    ____ Stop supplements until after surgery.    ____ Bring C-Pap to the hospital.

## 2018-07-23 NOTE — Consult Note (Signed)
[place] Nurse requested for preoperative stoma site marking  Discussed surgical procedure and stoma creation with patient.  Explained role of the [place] nurse team.  Provided the patient with educational booklet and provided samples of pouching options.  Answered patient questions.   Examined patient sitting and standing in order to place the marking in the patient's visual field, away from any creases or abdominal contour issues and within the rectus muscle.  Attempted to mark above the patient's belt line.   Marked for ileostomy in the RUQ  __7__cm to the right of the umbilicus and  _4___ cm above/below the umbilicus.  Patient's abdomen cleansed with CHG wipes at site markings, allowed to air dry prior to marking.Covered mark with thin film transparent dressing to preserve mark until date of surgery.   Thank you for the consult.  Val Riles, RN, MSN, CWOCN, CNS-BC, pager [phone]

## 2018-07-23 NOTE — Pre-Procedure Instructions (Signed)
NM myocardial perfusion SPECT multiple (stress and rest)12/04/2017 [place] Result Impression   Normal myocardial perfusion scan no evidence of stress-induced  myocardial-ischemia ejection fraction of 57% conclusion negative scan  Result Narrative  CARDIOLOGY DEPARTMENT [address], [place], [place] [address] [phone]  Procedure: Pharmacologic Myocardial Perfusion Imaging  ONE day procedure  Indication: Angina at rest (CMS-HCC) Plan: NM myocardial perfusion SPECT multiple (stress     and rest), ECG stress test only  SOB (shortness of breath) Plan: NM myocardial perfusion SPECT multiple (stress     and rest), ECG stress test only  Ordering Physician:   Dr. Lujean Amel   Clinical History: 78 y.o. year old male recent anginal symptoms known coronary artery  disease Vitals: Height: 64 in Weight: 188 lb Cardiac risk factors include:   Hyperlipidemia, Diabetes, HTN and CAD    Procedure:  Pharmacologic stress testing was performed with Regadenoson using a single  use 0.4mg /90ml (0.08 mg/ml) prefilled syringe intravenously infused as a  bolus dose. The stress test was stopped due to Infusion completion. Blood  pressure response was normal. The patient did not develop any symptoms  other than fatigue during the procedure.   Rest HR: 68bpm Rest BP: 138/58mmHg Max HR: 101bpm Min BP: 122/62mmHg  Stress Test Administered by: Oswald Hillock, CMA  ECG Interpretation: Rest ECG: normal sinus rhythm, none Stress ECG: normal sinus rhythm,  Recovery ECG: normal sinus rhythm ECG Interpretation: non-diagnostic due to pharmacologic testing.   Administrations This Visit   regadenoson (LEXISCAN) 0.4 mg/5 mL inj syringe 0.4 mg   Admin Date 12/03/2017 Action Given Dose 0.4 mg Route Intravenous Administered By Herbert Seta, CNMT     technetium Tc48m sestamibi (CARDIOLITE) injection 35.57  millicurie   Admin Date 12/03/2017 Action Given Dose 32.20 millicurie Route Intravenous Administered By Herbert Seta, CNMT     technetium Tc72m sestamibi (CARDIOLITE) injection 25.42 millicurie   Admin Date 12/03/2017 Action Given Dose 70.62 millicurie Route Intravenous Administered By Herbert Seta, CNMT       Gated post-stress perfusion imaging was performed 30 minutes after stress.  Rest images were performed 30 minutes after injection.  Gated LV Analysis:  TID Ratio: 1.10  LVEF= 57 %  FINDINGS: Regional wall motion: reveals normal myocardial thickening and wall  motion. The overall quality of the study is good.  Artifacts noted: no Left ventricular cavity: normal.  Perfusion Analysis: SPECT images demonstrate homogeneous tracer  distribution throughout the myocardium.   Status Results Details   Encounter Summary

## 2018-07-24 LAB — NOVEL CORONAVIRUS, NAA (HOSP ORDER, SEND-OUT TO REF LAB; TAT 18-24 HRS): SARS-CoV-2, NAA: NOT DETECTED

## 2018-07-27 ENCOUNTER — Inpatient Hospital Stay: Admission: RE | Admit: 2018-07-27 | Payer: Medicare Other | Source: Home / Self Care | Admitting: General Surgery

## 2018-07-27 ENCOUNTER — Other Ambulatory Visit: Payer: Self-pay | Admitting: General Surgery

## 2018-07-27 ENCOUNTER — Encounter: Admission: RE | Payer: Self-pay | Source: Home / Self Care

## 2018-07-27 DIAGNOSIS — C20 Malignant neoplasm of rectum: Secondary | ICD-10-CM

## 2018-07-27 SURGERY — COLECTOMY, HAND-ASSISTED, LAPAROSCOPIC
Anesthesia: General

## 2018-08-01 ENCOUNTER — Ambulatory Visit
Admission: RE | Admit: 2018-08-01 | Discharge: 2018-08-01 | Disposition: A | Discharge status: Home / Self Care | Payer: Medicare Other | Source: Ambulatory Visit | Attending: General Surgery | Admitting: General Surgery

## 2018-08-01 ENCOUNTER — Other Ambulatory Visit: Payer: Self-pay

## 2018-08-01 DIAGNOSIS — C20 Malignant neoplasm of rectum: Secondary | ICD-10-CM | POA: Diagnosis present

## 2018-08-01 MED ORDER — GADOBUTROL 1 MMOL/ML IV SOLN
7.0000 mL | Freq: Once | INTRAVENOUS | Status: AC | PRN
  Administered 2018-08-01: 7 mL via INTRAVENOUS

## 2018-08-04 ENCOUNTER — Other Ambulatory Visit: Payer: Self-pay

## 2018-08-04 ENCOUNTER — Encounter: Payer: Self-pay | Admitting: Oncology

## 2018-08-04 ENCOUNTER — Inpatient Hospital Stay: Payer: Medicare Other

## 2018-08-04 ENCOUNTER — Ambulatory Visit
Admission: RE | Admit: 2018-08-04 | Discharge: 2018-08-04 | Disposition: A | Discharge status: Home / Self Care | Payer: Medicare Other | Source: Ambulatory Visit | Attending: Radiation Oncology | Admitting: Radiation Oncology

## 2018-08-04 ENCOUNTER — Other Ambulatory Visit: Payer: Self-pay | Admitting: Oncology

## 2018-08-04 ENCOUNTER — Inpatient Hospital Stay: Payer: Medicare Other | Attending: Oncology | Admitting: Oncology

## 2018-08-04 VITALS — BP 117/75 | HR 83 | Temp 96.9°F | Resp 20 | Ht 63.0 in | Wt 179.0 lb

## 2018-08-04 DIAGNOSIS — I2510 Atherosclerotic heart disease of native coronary artery without angina pectoris: Secondary | ICD-10-CM | POA: Diagnosis not present

## 2018-08-04 DIAGNOSIS — R5381 Other malaise: Secondary | ICD-10-CM | POA: Diagnosis not present

## 2018-08-04 DIAGNOSIS — E119 Type 2 diabetes mellitus without complications: Secondary | ICD-10-CM | POA: Insufficient documentation

## 2018-08-04 DIAGNOSIS — E785 Hyperlipidemia, unspecified: Secondary | ICD-10-CM | POA: Diagnosis not present

## 2018-08-04 DIAGNOSIS — Z7189 Other specified counseling: Secondary | ICD-10-CM

## 2018-08-04 DIAGNOSIS — D509 Iron deficiency anemia, unspecified: Secondary | ICD-10-CM | POA: Diagnosis not present

## 2018-08-04 DIAGNOSIS — I10 Essential (primary) hypertension: Secondary | ICD-10-CM | POA: Insufficient documentation

## 2018-08-04 DIAGNOSIS — R5383 Other fatigue: Secondary | ICD-10-CM | POA: Diagnosis not present

## 2018-08-04 DIAGNOSIS — Z79899 Other long term (current) drug therapy: Secondary | ICD-10-CM | POA: Diagnosis not present

## 2018-08-04 DIAGNOSIS — C20 Malignant neoplasm of rectum: Secondary | ICD-10-CM | POA: Diagnosis present

## 2018-08-04 LAB — CBC WITH DIFFERENTIAL/PLATELET
Abs Immature Granulocytes: 0.01 10*3/uL (ref 0.00–0.07)
Basophils Absolute: 0.1 10*3/uL (ref 0.0–0.1)
Basophils Relative: 1 %
Eosinophils Absolute: 0.2 10*3/uL (ref 0.0–0.5)
Eosinophils Relative: 4 %
HCT: 37.6 % — ABNORMAL LOW (ref 39.0–52.0)
Hemoglobin: 12.1 g/dL — ABNORMAL LOW (ref 13.0–17.0)
Immature Granulocytes: 0 %
Lymphocytes Relative: 32 %
Lymphs Abs: 1.8 10*3/uL (ref 0.7–4.0)
MCH: 27.1 pg (ref 26.0–34.0)
MCHC: 32.2 g/dL (ref 30.0–36.0)
MCV: 84.3 fL (ref 80.0–100.0)
Monocytes Absolute: 0.4 10*3/uL (ref 0.1–1.0)
Monocytes Relative: 7 %
Neutro Abs: 3.3 10*3/uL (ref 1.7–7.7)
Neutrophils Relative %: 56 %
Platelets: 273 10*3/uL (ref 150–400)
RBC: 4.46 MIL/uL (ref 4.22–5.81)
RDW: 15.2 % (ref 11.5–15.5)
WBC: 5.8 10*3/uL (ref 4.0–10.5)
nRBC: 0 % (ref 0.0–0.2)

## 2018-08-04 LAB — COMPREHENSIVE METABOLIC PANEL
ALT: 15 U/L (ref 0–44)
AST: 17 U/L (ref 15–41)
Albumin: 3.7 g/dL (ref 3.5–5.0)
Alkaline Phosphatase: 78 U/L (ref 38–126)
Anion gap: 7 (ref 5–15)
BUN: 15 mg/dL (ref 8–23)
CO2: 26 mmol/L (ref 22–32)
Calcium: 9.1 mg/dL (ref 8.9–10.3)
Chloride: 107 mmol/L (ref 98–111)
Creatinine, Ser: 1.26 mg/dL — ABNORMAL HIGH (ref 0.61–1.24)
GFR calc Af Amer: >60 mL/min (ref >60)
GFR calc non Af Amer: 54 mL/min — ABNORMAL LOW (ref >60)
Glucose, Bld: 123 mg/dL — ABNORMAL HIGH (ref 70–99)
Potassium: 4.7 mmol/L (ref 3.5–5.1)
Sodium: 140 mmol/L (ref 135–145)
Total Bilirubin: 0.4 mg/dL (ref 0.3–1.2)
Total Protein: 7.1 g/dL (ref 6.5–8.1)

## 2018-08-04 LAB — FERRITIN: Ferritin: 9 ng/mL — ABNORMAL LOW (ref 24–336)

## 2018-08-04 LAB — IRON AND TIBC
Iron: 44 ug/dL — ABNORMAL LOW (ref 45–182)
Saturation Ratios: 12 % — ABNORMAL LOW (ref 17.9–39.5)
TIBC: 381 ug/dL (ref 250–450)
UIBC: 337 ug/dL

## 2018-08-04 NOTE — Consult Note (Signed)
NEW PATIENT EVALUATION  Name: Willie [name]  MRN: [id number]  Date:   08/04/2018     DOB: December 24, 1940   This 78 y.o. 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  REFERRING PHYSICIAN: [name], MD  CHIEF COMPLAINT: No chief complaint on file.   DIAGNOSIS: There were no encounter diagnoses.   PREVIOUS INVESTIGATIONS:  MRI and CT scans reviewed Pathology report reviewed Clinical notes reviewed  HPI: Patient is a 78 year old male with multiple medical comorbidities including insulin-dependent diabetes and anemia.  He reported rectal bleeding for about the past 3 months.  Lower endoscopy found to masses at the rectosigmoid junction which on biopsy were tubulous villous adenoma with high-grade dysplasia.  There was also a proximal sigmoid colon lesion which was positive for adenocarcinoma moderately differentiated.  CT scan confirmed asymmetric mural thickening of the proximal rectum over approximately a 60 cm span.  Also have potential for nodal metastasis.  Patient underwent an MRI scan of the pelvis compartment confirming a T3N2 rectal adenocarcinoma distance from tumor to internal anal sphincter 6.2 cm.  He has been seen and evaluated by medical oncology and initial plan is for neoadjuvant chemotherapy with FOLFOX followed by concurrent chemoradiation therapy.  He is having a referral to [facility] surgery as well as a CT scan of his chest to complete his staging work-up.  He is seen today and doing well.  He states the bleeding has subsided.  He has no pain on defecation.  PLANNED TREATMENT REGIMEN: Neoadjuvant chemotherapy followed by neoadjuvant chemoradiation  PAST MEDICAL HISTORY:  has a past medical history of Anemia, Cancer ([place]), Coronary artery disease, Diabetes mellitus without complication ([place]), History of hiatal hernia, Hypercholesterolemia, and Hypertension.    PAST SURGICAL HISTORY:  Past Surgical  History:  Procedure Laterality Date  . COLONOSCOPY WITH PROPOFOL N/A 07/15/2018   Procedure: COLONOSCOPY WITH PROPOFOL;  Surgeon: [name], [name], MD;  Location: [facility] ENDOSCOPY;  Service: Gastroenterology;  Laterality: N/A;  . ESOPHAGOGASTRODUODENOSCOPY (EGD) WITH PROPOFOL N/A 07/15/2018   Procedure: ESOPHAGOGASTRODUODENOSCOPY (EGD) WITH PROPOFOL;  Surgeon: [name], [name], MD;  Location: [facility] ENDOSCOPY;  Service: Gastroenterology;  Laterality: N/A;  . PROSTATE SURGERY      FAMILY HISTORY: family history is not on file.  SOCIAL HISTORY:  reports that he has never smoked. He has never used smokeless tobacco. He reports previous alcohol use. He reports that he does not use drugs.  ALLERGIES: Patient has no known allergies.  MEDICATIONS:  Current Outpatient Medications  Medication Sig Dispense Refill  . ezetimibe (ZETIA) 10 MG tablet Take 10 mg by mouth every morning.     . ferrous sulfate 325 (65 FE) MG tablet Take 325 mg by mouth daily.    Marland Kitchen HUMALOG MIX 75/25 KWIKPEN (75-25) 100 UNIT/ML Kwikpen Inject 30 Units into the skin every morning.     Marland Kitchen lisinopril (PRINIVIL,ZESTRIL) 5 MG tablet Take 5 mg by mouth every morning.     . metoprolol succinate (TOPROL-XL) 50 MG 24 hr tablet Take 50 mg by mouth every morning.     . niacin (NIASPAN) 500 MG CR tablet Take 500 mg by mouth every evening.     . pantoprazole (PROTONIX) 40 MG tablet Take 40 mg by mouth every morning.     . traZODone (DESYREL) 50 MG tablet Take 50 mg by mouth at bedtime as needed for sleep.      No current facility-administered medications for this encounter.  ECOG PERFORMANCE STATUS:  1 - Symptomatic but completely ambulatory  REVIEW OF SYSTEMS: Patient denies any weight loss, fatigue, weakness, fever, chills or night sweats. Patient denies any loss of vision, blurred vision. Patient denies any ringing  of the ears or hearing loss. No irregular heartbeat. Patient denies heart murmur or history of fainting. Patient denies  any chest pain or pain radiating to her upper extremities. Patient denies any shortness of breath, difficulty breathing at night, cough or hemoptysis. Patient denies any swelling in the lower legs. Patient denies any nausea vomiting, vomiting of blood, or coffee ground material in the vomitus. Patient denies any stomach pain. Patient states has had normal bowel movements no significant constipation or diarrhea. Patient denies any dysuria, hematuria or significant nocturia. Patient denies any problems walking, swelling in the joints or loss of balance. Patient denies any skin changes, loss of hair or loss of weight. Patient denies any excessive worrying or anxiety or significant depression. Patient denies any problems with insomnia. Patient denies excessive thirst, polyuria, polydipsia. Patient denies any swollen glands, patient denies easy bruising or easy bleeding. Patient denies any recent infections, allergies or URI. Patient "s visual fields have not changed significantly in recent time.   PHYSICAL EXAM: There were no vitals taken for this visit. Well-developed well-nourished patient in NAD. HEENT reveals PERLA, EOMI, discs not visualized.  Oral cavity is clear. No oral mucosal lesions are identified. Neck is clear without evidence of cervical or supraclavicular adenopathy. Lungs are clear to A&P. Cardiac examination is essentially unremarkable with regular rate and rhythm without murmur rub or thrill. Abdomen is benign with no organomegaly or masses noted. Motor sensory and DTR levels are equal and symmetric in the upper and lower extremities. Cranial nerves II through XII are grossly intact. Proprioception is intact. No peripheral adenopathy or edema is identified. No motor or sensory levels are noted. Crude visual fields are within normal range.  LABORATORY DATA: Pathology reports reviewed    RADIOLOGY RESULTS: MRI scan and CT scans reviewed   IMPRESSION: Stage III adenocarcinoma of the rectum in  78 year old male  PLAN: At this time I agree with Dr. Elroy Channel assessment and treatment plan.  He will start with FOLFOX chemotherapy and then referred back to me for initiation of chemoradiation.  I would plan on delivering 4500 cGy to his rectum and pelvic nodes.  I also would boost his rectal tumor another 540 cGy.  Risks and benefits of treatment including increased lower urinary tract symptoms diarrhea fatigue alteration of blood count skin reaction all were discussed in detail with the patient.  He seems to comprehend my treatment plan well.  I will await a referral back from medical oncology to reevaluate him to start and commence with his chemoradiation.  Patient knows to call with any concerns.  I would like to take this opportunity to thank you for allowing me to participate in the care of your patient.Noreene Filbert, MD

## 2018-08-04 NOTE — Progress Notes (Signed)
Tumor board 

## 2018-08-05 ENCOUNTER — Encounter: Payer: Self-pay | Admitting: Oncology

## 2018-08-05 ENCOUNTER — Telehealth: Payer: Self-pay

## 2018-08-05 DIAGNOSIS — Z7189 Other specified counseling: Secondary | ICD-10-CM | POA: Insufficient documentation

## 2018-08-05 DIAGNOSIS — C20 Malignant neoplasm of rectum: Secondary | ICD-10-CM | POA: Insufficient documentation

## 2018-08-05 MED ORDER — DEXAMETHASONE 4 MG PO TABS: 8.0000 mg | ORAL_TABLET | Freq: Every day | ORAL | 1 refills | Status: DC

## 2018-08-05 MED ORDER — PROCHLORPERAZINE MALEATE 10 MG PO TABS: 10.0000 mg | ORAL_TABLET | Freq: Four times a day (QID) | ORAL | 1 refills | Status: DC | PRN

## 2018-08-05 MED ORDER — LIDOCAINE-PRILOCAINE 2.5-2.5 % EX CREA: TOPICAL_CREAM | CUTANEOUS | 3 refills | Status: DC

## 2018-08-05 MED ORDER — ONDANSETRON HCL 8 MG PO TABS: 8.0000 mg | ORAL_TABLET | Freq: Two times a day (BID) | ORAL | 1 refills | Status: DC | PRN

## 2018-08-05 NOTE — Telephone Encounter (Signed)
Appointment has been arranged with Dr. Kem Parkinson with [facility]. Reviewed details with [name]. Directions to the cancer center given.  Encouraged to call with any questions or concerns.  08/20/2018 12:00 PM  NEW PATIENT 15 min. [facility] [name], [name], MD Patient Instructions:

## 2018-08-05 NOTE — Progress Notes (Signed)
Hematology/Oncology Consult note [facility] Telephone:([phone] [phone]  Patient Care Team: [name], MD as PCP - General (Internal Medicine)   Name of the patient: Willie [name]  [id number]  05-Sep-1940    Reason for referral-new diagnosis of rectal cancer   Referring physician-Dr. [name]  Date of visit: 08/05/18   History of presenting illness-patient is a 78 year old male with a past medical history significant for hypertension hyperlipidemia and diabetes among other medical problems.  He was recently found to have iron deficiency anemia which led to a colonoscopy On 07/15/2018.  Colonoscopy showed a villous partially obstructing medium-sized mass in the mid sigmoid colon.  The mass was partially circumferential measuring 5 cm.  There was another infiltrative partially obstructing medium-sized mass found in the rectosigmoid colon which also measured 5 cm.  Pathology from the sigmoid colon mass showed high-grade dysplasia at least involving an adenomatous lesion with villous features.  A more serious process is not excluded.  Rectosigmoid mass biopsy showed invasive colorectal adenocarcinoma.  Patient was referred to Dr. [name] for surgical management.  Patient had a CT abdomen and pelvis with contrast which showed irregular asymmetric mural thickening of the proximal rectum measuring over 6 to 7 cm.  Potential local nodal metastases with several subcentimeter iliac lymph nodes and presacral lymph nodes.  No evidence of metastatic disease outside the pelvis.  Patient also had MRI of the pelvis with and without contrast which showed tumor length 8.5 cm with extension through muscularis propria decreased C.  There is diffuse involvement of muscularis propria.  No extramural vascular invasion or tumor thrombus.  Shortest distance of any tumor/note from mesorectal fascia 1 to 2 mm.  No involvement of adjacent organs or pelvic sidewall.  Mesorectal  lymph nodes within perirectal fat including 6 mm node and a low sigmoid mesocolon node measuring 6 mm.  Another 6 mm node within the low sigmoid mesocolon representing extremity rectal lymphadenopathy.  T3N2 by MRI.  Distance from tumor to internal anal sphincter is 6.2 cm.  Given the presence of rectal lesion Dr. [name] would not be able to perform total mesorectal excision.  He has been referred to [place] for further management  Patient at baseline lives alone.  His wife died about a year ago.  He is independent of his ADLs and IADLs.  Reports that his appetite is good he denies any unintentional weight loss.  He had some blood in his stools prior to his colonoscopy but none since then.  Reports occasional problems moving his bowels but overall his bowels have been regular.    ECOG PS- 0  Pain scale- 0   Review of systems- Review of Systems  Constitutional: Positive for malaise/fatigue. Negative for chills, fever and weight loss.  HENT: Negative for congestion, ear discharge and nosebleeds.   Eyes: Negative for blurred vision.  Respiratory: Negative for cough, hemoptysis, sputum production, shortness of breath and wheezing.   Cardiovascular: Negative for chest pain, palpitations, orthopnea and claudication.  Gastrointestinal: Negative for abdominal pain, blood in stool, constipation, diarrhea, heartburn, melena, nausea and vomiting.  Genitourinary: Negative for dysuria, flank pain, frequency, hematuria and urgency.  Musculoskeletal: Negative for back pain, joint pain and myalgias.  Skin: Negative for rash.  Neurological: Negative for dizziness, tingling, focal weakness, seizures, weakness and headaches.  Endo/Heme/Allergies: Does not bruise/bleed easily.  Psychiatric/Behavioral: Negative for depression and suicidal ideas. The patient does not have insomnia.     No Known Allergies  Patient Active Problem List  Diagnosis Date Noted   Ventricular tachycardia ([place]) 10/31/2017     Past  Medical History:  Diagnosis Date   Anemia    Cancer ([place])    Coronary artery disease    Diabetes mellitus without complication ([place])    History of hiatal hernia    Hypercholesterolemia    Hypertension      Past Surgical History:  Procedure Laterality Date   COLONOSCOPY WITH PROPOFOL N/A 07/15/2018   Procedure: COLONOSCOPY WITH PROPOFOL;  Surgeon: [name], [name], MD;  Location: [facility] ENDOSCOPY;  Service: Gastroenterology;  Laterality: N/A;   ESOPHAGOGASTRODUODENOSCOPY (EGD) WITH PROPOFOL N/A 07/15/2018   Procedure: ESOPHAGOGASTRODUODENOSCOPY (EGD) WITH PROPOFOL;  Surgeon: [name], [name], MD;  Location: [facility] ENDOSCOPY;  Service: Gastroenterology;  Laterality: N/A;   PROSTATE SURGERY      Social History   Socioeconomic History   Marital status: Married    Spouse name: Not on file   Number of children: Not on file   Years of education: Not on file   Highest education level: Not on file  Occupational History   Not on file  Social Needs   Financial resource strain: Not hard at all   Food insecurity:    Worry: Never true    Inability: Never true   Transportation needs:    Medical: No    Non-medical: No  Tobacco Use   Smoking status: Never Smoker   Smokeless tobacco: Never Used  Substance and Sexual Activity   Alcohol use: Not Currently   Drug use: Never   Sexual activity: Not on file  Lifestyle   Physical activity:    Days per week: 2 days    Minutes per session: 30 min   Stress: Only a little  Relationships   Social connections:    Talks on phone: Not on file    Gets together: Not on file    Attends religious service: Not on file    Active member of club or organization: Not on file    Attends meetings of clubs or organizations: Not on file    Relationship status: Widowed   Intimate partner violence:    Fear of current or ex partner: Patient refused    Emotionally abused: Patient refused    Physically abused: Patient refused     Forced sexual activity: Patient refused  Other Topics Concern   Not on file  Social History Narrative   Not on file     History reviewed. No pertinent family history.   Current Outpatient Medications:    ezetimibe (ZETIA) 10 MG tablet, Take 10 mg by mouth every morning. , Disp: , Rfl:    ferrous sulfate 325 (65 FE) MG tablet, Take 325 mg by mouth daily., Disp: , Rfl:    HUMALOG MIX 75/25 KWIKPEN (75-25) 100 UNIT/ML Kwikpen, Inject 30 Units into the skin every morning. , Disp: , Rfl:    lisinopril (PRINIVIL,ZESTRIL) 5 MG tablet, Take 5 mg by mouth every morning. , Disp: , Rfl:    metoprolol succinate (TOPROL-XL) 50 MG 24 hr tablet, Take 50 mg by mouth every morning. , Disp: , Rfl:    niacin (NIASPAN) 500 MG CR tablet, Take 500 mg by mouth every evening. , Disp: , Rfl:    pantoprazole (PROTONIX) 40 MG tablet, Take 40 mg by mouth every morning. , Disp: , Rfl:    traZODone (DESYREL) 50 MG tablet, Take 50 mg by mouth at bedtime as needed for sleep. , Disp: , Rfl:    Physical exam:  Vitals:   08/04/18 1400 08/04/18 1406  BP: 117/75 117/75  Pulse: 83 83  Resp: 18 20  Temp:  (!) 96.9 F (36.1 C)  TempSrc:  Tympanic  Weight:  179 lb (81.2 kg)  Height:  5\' 3"  (1.6 m)   Physical Exam Constitutional:      General: He is not in acute distress. HENT:     Head: Normocephalic and atraumatic.  Eyes:     Pupils: Pupils are equal, round, and reactive to light.  Neck:     Musculoskeletal: Normal range of motion.  Cardiovascular:     Rate and Rhythm: Normal rate and regular rhythm.     Heart sounds: Normal heart sounds.  Pulmonary:     Effort: Pulmonary effort is normal.     Breath sounds: Normal breath sounds.  Abdominal:     General: Bowel sounds are normal. There is no distension.     Palpations: Abdomen is soft.     Tenderness: There is no abdominal tenderness.  Skin:    General: Skin is warm and dry.  Neurological:     Mental Status: He is alert and oriented to  person, place, and time.        CMP Latest Ref Rng & Units 08/04/2018  Glucose 70 - 99 mg/dL 123(H)  BUN 8 - 23 mg/dL 15  Creatinine 0.61 - 1.24 mg/dL 1.26(H)  Sodium 135 - 145 mmol/L 140  Potassium 3.5 - 5.1 mmol/L 4.7  Chloride 98 - 111 mmol/L 107  CO2 22 - 32 mmol/L 26  Calcium 8.9 - 10.3 mg/dL 9.1  Total Protein 6.5 - 8.1 g/dL 7.1  Total Bilirubin 0.3 - 1.2 mg/dL 0.4  Alkaline Phos 38 - 126 U/L 78  AST 15 - 41 U/L 17  ALT 0 - 44 U/L 15   CBC Latest Ref Rng & Units 08/04/2018  WBC 4.0 - 10.5 K/uL 5.8  Hemoglobin 13.0 - 17.0 g/dL 12.1(L)  Hematocrit 39.0 - 52.0 % 37.6(L)  Platelets 150 - 400 K/uL 273    No images are attached to the encounter.  Mr Pelvis W Wo Contrast  Result Date: 08/04/2018 CLINICAL DATA:  Rectal cancer. Sigmoid and rectosigmoid junction masses. EXAM: MRI PELVIS WITHOUT AND WITH CONTRAST TECHNIQUE: Multiplanar multisequence MR imaging of the pelvis was performed both before and after administration of intravenous contrast. Small amount of [place] gel was administered per rectum to optimize tumor evaluation. CONTRAST:  7 cc of Gadavist COMPARISON:  CT 07/23/2018.  Colonoscopy report of 07/15/2018. FINDINGS: TUMOR LOCATION Tumor distance from Anal Verge/Skin Surface: 13.3 cm, including on image 20/2. Tumor distance from Internal Anal Sphincter: 6.2 cm, including on image 20/7. TUMOR DESCRIPTION Circumferential Extent: Circumferential, 360 degrees Tumor Length: 8.5 cm, including image 18/2. T - CATEGORY Extension through Muscularis Propria: Yes 6-91mm=T3c. There is relatively diffuse involvement of the muscularis propria, including on image 15/5. The most well-defined extension through the muscularis propria is at the 10 o'clock position, measuring approximately 8 mm. Shortest Distance of any tumor/node from Mesorectal Fascia: 1-2 mm on image 15/5. Extramural Vascular Invasion/Tumor Thrombus: No Invasion of Anterior Peritoneal Reflection: Tumor is immediately adjacent to  the anterior peritoneal reflection, without gross invasion. Example image 21/2. Involvement of Adjacent Organs or Pelvic Sidewall: No Levator Ani Involvement: No N - CATEGORY Mesorectal Lymph Nodes >=62mm: Nodes within the perirectal fat including at 6 mm on image 11/4. A low sigmoid mesocolon node measures 6 mm on image 6/5. Extra-mesorectal Lymphadenopathy: 6 mm node within the  low sigmoid mesocolon. Other: No significant free fluid. Urinary bladder entering a right inguinal hernia, as on CT. IMPRESSION: Rectal adenocarcinoma T stage: T3 Rectal adenocarcinoma N stage:  N2 Distance from tumor to the internal anal sphincter is 6.2 cm. Electronically Signed   By: Abigail Miyamoto M.D.   On: 08/04/2018 09:04   Ct Abdomen Pelvis W Contrast  Result Date: 07/24/2018 CLINICAL DATA:  Colorectal mass on endoscopy. EXAM: CT ABDOMEN AND PELVIS WITH CONTRAST TECHNIQUE: Multidetector CT imaging of the abdomen and pelvis was performed using the standard protocol following bolus administration of intravenous contrast. CONTRAST:  177mL OMNIPAQUE IOHEXOL 300 MG/ML  SOLN COMPARISON:  None. FINDINGS: Lower chest: Lung bases are clear. Hepatobiliary: No focal hepatic lesion. No biliary duct dilatation. Gallbladder is normal. Common bile duct is normal. Pancreas: Pancreas is normal. No ductal dilatation. No pancreatic inflammation. Spleen: Normal spleen Adrenals/urinary tract: Adrenal glands are normal. Simple fluid attenuation cystic lesion in the mid cortex RIGHT kidney. No hydronephrosis. The RIGHT aspect of the bladder extends into a RIGHT inguinal hernia. The bladder extends approximately 5-6 cm into the hernia sac (image 42/6). Stomach/Bowel: Stomach, duodenum small-bowel normal. Is appendix normal. There multiple diverticula of the ascending and transverse colon. Multiple diverticular of the descending colon. At the junction of the descending colon and proximal sigmoid colon there is rounded mass measuring 2.4 cm (image 42/2).  This mass is contained within lumen of the colon. No colon wall thickening. At the junction of the sigmoid colon and rectum rectum there is a more irregular mass which is thick-walled and eccentric and enhancing and extending over approximately 6.5 cm of the proximal rectum (image 52/6). Several small iliac subcentimeter nodes are present. Several small subcentimeter nodes in the presacral fat (image 57/2 image 53/2). Vascular/Lymphatic: Abdominal aorta normal caliber. Mild intimal calcification. No significant adenopathy other than the above described small iliac nodes and presacral nodes Reproductive: Post prostatectomy Other: No peritoneal metastasis Musculoskeletal: No aggressive osseous lesion. IMPRESSION: 1. Irregular asymmetric mural thickening of the proximal rectum over approximately 6-7 cm consistent with colorectal adenocarcinoma. 2. Potential local nodal metastasis with several subcentimeter iliac lymph nodes and presacral lymph nodes. 3. Rounded lesion in the proximal sigmoid colon corresponds to second lesion described on colonoscopy. 4. Extensive diverticular disease throughout the colon. 5. No evidence of metastatic disease outside of the pelvis. 6. A relatively long segment of the bladder (6 cm) extends into a RIGHT inguinal hernia sac. Electronically Signed   By: Suzy Bouchard M.D.   On: 07/24/2018 15:54    Assessment and plan- Patient is a 78 y.o. male with newly diagnosed stage IIIb rectal adenocarcinoma cT3 cN2 cMX  1.  I have reviewed CT abdomen and pelvis and MRI images independently and discussed findings with the patient.  Patient has locally advanced rectal adenocarcinoma and has potentially resectable disease at this time.  He would however benefit from neoadjuvant treatment and given his bulky disease, total neoadjuvant therapy with 4 months of FOLFOX chemotherapy followed by chemoradiation would be a reasonable approach.  At this time I will refer him to [facility] colorectal surgery  to discuss surgical options with him.  Dr. [name] will also be putting in port for chemotherapy.  I would recommend 8 cycles of neoadjuvant FOLFOX chemotherapy given IV.  Each cycle given every 2 weeks.  I will obtain interim scans after 4 cycles.  After 8 cycles of FOLFOX chemotherapy, patient will go on to receive concurrent chemoradiation and I will decide if he  can get daily IV FOLFOX for 5 days versus Xeloda at that time.  Discussed risks and benefits of FOLFOX chemotherapy including all but not limited to nausea, vomiting, low blood counts, fatigue, risk of infections and hospitalization.  Risk of peripheral neuropathy associated with FOLFOX.  Treatment will be given with a curative intent.  I will be discussing his case at tumor board this week and tentatively plan to start chemotherapy in 2 weeks time.  Patient will also be seen by radiation oncology later today.  Also patient noted to have 2 separate masses 1 in the rectosigmoid and the other one in the sigmoid which likely can be resected at the same time during surgery  I will get a CT chest with contrast to complete his staging work-up and obtain a baseline CEA  2.  Iron deficiency anemia: Check CBC with differential, CMP and ferritin and iron studies today.  I recommend 2 doses of Feraheme 510 mg IV weekly x2.  Discussed risks and benefits of Feraheme including all but not limited to headache, leg swelling and possible risk of infusion reaction.  Patient understands and agrees to proceed as planned  I will see him back in 2 weeks time to start his first cycle of FOLFOX chemotherapy.  He will also receive his first dose of Feraheme on that day  Cancer Staging Rectal cancer [facility]) Staging form: Colon and Rectum, AJCC 8th Edition - Clinical stage from 08/04/2018: Stage IIIB (cT3, cN2a, cM0) - Signed by Sindy Guadeloupe, MD on 08/05/2018   Total face to face encounter time for this patient visit was 40 min. >50% of the time was  spent in  counseling and coordination of care.     Thank you for this kind referral and the opportunity to participate in the care of this patient   Visit Diagnosis 1. 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[place])   2. Goals of care, counseling/discussion   3. Iron deficiency anemia, unspecified iron deficiency anemia type     Dr. Randa Evens, MD, MPH [facility] [phone] 08/05/2018 11:26 AM

## 2018-08-05 NOTE — Progress Notes (Signed)
START ON PATHWAY REGIMEN - Colorectal     A cycle is every 14 days:     Oxaliplatin      Leucovorin      5-Fluorouracil      5-Fluorouracil   **Always confirm dose/schedule in your pharmacy ordering system**  Patient Characteristics: Preoperative or Nonsurgical Candidate (Clinical Staging), Rectal, cT3 - cT4, cN0 or Any cT, cN+ Therapeutic Status: Preoperative or Nonsurgical Candidate (Clinical Staging) Tumor Location: Rectal AJCC T Category: cT3 AJCC N Category: cN2a AJCC M Category: cM0 AJCC 8 Stage Grouping: IIIB Intent of Therapy: Curative Intent, Discussed with Patient

## 2018-08-06 ENCOUNTER — Other Ambulatory Visit: Payer: Medicare Other

## 2018-08-06 LAB — CEA: CEA: 1.6 ng/mL (ref 0.0–4.7)

## 2018-08-06 NOTE — Progress Notes (Addendum)
Tumor Board Documentation  ROCKNE DEARINGER was presented by Dr Janese Banks at our Tumor Board on 08/06/2018, which included representatives from medical oncology, radiation oncology, pathology, radiology, surgical, navigation, internal medicine, genetics, research, palliative care, pulmonology.  Delbert currently presents as a current patient, for discussion, for Willie [name] with history of the following treatments: surgical intervention(s).  Additionally, we reviewed previous medical and familial history, history of present illness, and recent lab results along with all available histopathologic and imaging studies. The tumor board considered available treatment options and made the following recommendations:Total neoadjuvant therapy.  Chemotherapy followed by chemo/RT then surgery Refer to [facility] for GI surgery then Chemo and  Radiation therapy  The following procedures/referrals were also placed: No orders of the defined types were placed in this encounter.   Clinical Trial Status: not discussed   Staging used: AJCC Stage Group  AJCC Staging: T: T3 N: N2a   Group: Stage 3B Rectal Cancer   National site-specific guidelines NCCN were discussed with respect to the case.  Tumor board is a meeting of clinicians from various specialty areas who evaluate and discuss patients for whom a multidisciplinary approach is being considered. Final determinations in the plan of care are those of the provider(s). The responsibility for follow up of recommendations given during tumor board is that of the provider.   Today's extended care, comprehensive team conference, Carole was not present for the discussion and was not examined.   Multidisciplinary Tumor Board is a multidisciplinary case peer review process.  Decisions discussed in the Multidisciplinary Tumor Board reflect the opinions of the specialists present at the conference without having examined the patient.  Ultimately, treatment and diagnostic decisions rest  with the primary provider(s) and the patient.

## 2018-08-06 NOTE — Patient Instructions (Signed)

## 2018-08-07 ENCOUNTER — Ambulatory Visit
Admission: RE | Admit: 2018-08-07 | Discharge: 2018-08-07 | Disposition: A | Discharge status: Home / Self Care | Payer: Medicare Other | Source: Ambulatory Visit | Attending: Oncology | Admitting: Oncology

## 2018-08-07 ENCOUNTER — Other Ambulatory Visit: Payer: Self-pay

## 2018-08-07 DIAGNOSIS — C20 Malignant neoplasm of rectum: Secondary | ICD-10-CM | POA: Diagnosis present

## 2018-08-07 MED ORDER — IOHEXOL 300 MG/ML  SOLN
75.0000 mL | Freq: Once | INTRAMUSCULAR | Status: AC | PRN
  Administered 2018-08-07: 13:00:00 75 mL via INTRAVENOUS

## 2018-08-10 ENCOUNTER — Other Ambulatory Visit
Admission: RE | Admit: 2018-08-10 | Discharge: 2018-08-10 | Disposition: A | Discharge status: Home / Self Care | Payer: Medicare Other | Source: Ambulatory Visit | Attending: General Surgery | Admitting: General Surgery

## 2018-08-10 ENCOUNTER — Inpatient Hospital Stay: Payer: Medicare Other | Attending: Oncology

## 2018-08-10 ENCOUNTER — Other Ambulatory Visit: Payer: Self-pay

## 2018-08-10 DIAGNOSIS — Z794 Long term (current) use of insulin: Secondary | ICD-10-CM | POA: Insufficient documentation

## 2018-08-10 DIAGNOSIS — Z79899 Other long term (current) drug therapy: Secondary | ICD-10-CM | POA: Insufficient documentation

## 2018-08-10 DIAGNOSIS — C20 Malignant neoplasm of rectum: Secondary | ICD-10-CM | POA: Insufficient documentation

## 2018-08-10 DIAGNOSIS — D509 Iron deficiency anemia, unspecified: Secondary | ICD-10-CM | POA: Insufficient documentation

## 2018-08-10 DIAGNOSIS — Z1159 Encounter for screening for other viral diseases: Secondary | ICD-10-CM | POA: Insufficient documentation

## 2018-08-10 DIAGNOSIS — I10 Essential (primary) hypertension: Secondary | ICD-10-CM | POA: Insufficient documentation

## 2018-08-10 DIAGNOSIS — G893 Neoplasm related pain (acute) (chronic): Secondary | ICD-10-CM | POA: Insufficient documentation

## 2018-08-10 DIAGNOSIS — T451X5A Adverse effect of antineoplastic and immunosuppressive drugs, initial encounter: Secondary | ICD-10-CM | POA: Insufficient documentation

## 2018-08-10 DIAGNOSIS — N179 Acute kidney failure, unspecified: Secondary | ICD-10-CM | POA: Insufficient documentation

## 2018-08-10 DIAGNOSIS — E119 Type 2 diabetes mellitus without complications: Secondary | ICD-10-CM | POA: Insufficient documentation

## 2018-08-10 DIAGNOSIS — D701 Agranulocytosis secondary to cancer chemotherapy: Secondary | ICD-10-CM | POA: Insufficient documentation

## 2018-08-10 DIAGNOSIS — Z5111 Encounter for antineoplastic chemotherapy: Secondary | ICD-10-CM | POA: Insufficient documentation

## 2018-08-11 LAB — NOVEL CORONAVIRUS, NAA (HOSP ORDER, SEND-OUT TO REF LAB; TAT 18-24 HRS): SARS-CoV-2, NAA: NOT DETECTED

## 2018-08-12 ENCOUNTER — Encounter: Payer: Self-pay | Admitting: Anesthesiology

## 2018-08-12 ENCOUNTER — Ambulatory Visit: Payer: Self-pay | Admitting: General Surgery

## 2018-08-12 MED ORDER — CEFAZOLIN SODIUM-DEXTROSE 2-4 GM/100ML-% IV SOLN
2.0000 g | INTRAVENOUS | Status: AC
  Administered 2018-08-13: 2 g via INTRAVENOUS

## 2018-08-12 NOTE — H&P (View-Only) (Signed)
HISTORY OF PRESENT ILLNESS:    Mr. Willie [name] is a 78 y.o.male patient who comes for preoperative evaluation of insertion of Chemo-Port.  Patient with history of rectal bleeding and chronic anemia.  Diagnostic colonoscopy was done and was found with a rectal mass.  Patient also have a sigmoid mass.  Both masses were biopsied.  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  MRI shows a mass penetrated through the muscularis propria and there is mesorectal lymph node more than 5 mm.  Patient was evaluated by oncology and recommended neoadjuvant chemotherapy and radiation therapy.  Patient denies pain radiation.  No alleviating or aggravating factor.  Patient at this appointment understanding the current plan.      PAST MEDICAL HISTORY:      Past Medical History:  Diagnosis Date  . Colon cancer (CMS-HCC) 07/2018  . Dermatophytosis of nail 05/28/2013  . GERD (gastroesophageal reflux disease)   . Hematoma of neck left  . Hypercholesteremia   . Hypertension   . Prostate cancer (CMS-HCC)   . Type II or unspecified type diabetes mellitus without mention of complication, not stated as uncontrolled (CMS-HCC) 05/28/2013        PAST SURGICAL HISTORY:        Past Surgical History:  Procedure Laterality Date  . COLONOSCOPY  07/15/2018   Invasive colorectal adenocarcinoma/High grade dysplasiain an adenomatous lesion with villous features/Tubular adenoma/Repeat 1yr/TKT  . CORONARY ANGIOPLASTY    . EGD  07/15/2018   Normal EGD/Hiatal hernia/No Repeat/TKT         MEDICATIONS:  EncounterMedications        Outpatient Encounter Medications as of 08/05/2018  Medication Sig Dispense Refill  . ferrous sulfate 325 (65 FE) MG EC tablet Take 1 tablet (325 mg total) by mouth daily with breakfast 90 tablet 1  . flash glucose scanning reader (FREESTYLE LIBRE 14 DAY READER) Misc Use 1 each as directed 1 each 0  . flash glucose sensor (FREESTYLE LIBRE 14  DAY SENSOR) Kit Use 1 kit 2 (two) times daily 6 each 1  . HUMALOG MIX 75-25 KWIKPEN 100 unit/mL (75-25) pen injector Inject 40 Units subcutaneously once daily 15 mL 6  . lisinopril (PRINIVIL,ZESTRIL) 5 MG tablet Take 1 tablet (5 mg total) by mouth once daily 90 tablet 3  . metoprolol succinate (TOPROL-XL) 50 MG XL tablet TAKE 1 TABLET BY MOUTH ONCE DAILY 90 tablet 3  . niacin (NIASPAN) 500 MG ER tablet Take 1 tablet (500 mg total) by mouth nightly 90 tablet 3  . pantoprazole (PROTONIX) 40 MG DR tablet Take 1 tablet (40 mg total) by mouth once daily 90 tablet 1  . traZODone (DESYREL) 50 MG tablet Take 1 tablet (50 mg total) by mouth nightly 90 tablet 1  . TRUEPLUS PEN NEEDLE 32 gauge x 5/32" Ndle USE AS DIRECTED. WITH PEN 100 each 1   No facility-administered encounter medications on file as of 08/05/2018.        ALLERGIES:   Patient has no known allergies.   SOCIAL HISTORY:  Social History          Socioeconomic History  . Marital status: Married    Spouse name: Not on file  . Number of children: Not on file  . Years of education: Not on file  . Highest education level: Not on file  Occupational History  . Not on file  Social Needs  . Financial resource strain: Not on file  . Food insecurity:      Worry: Not on file    Inability: Not on file  . Transportation needs:    Medical: Not on file    Non-medical: Not on file  Tobacco Use  . Smoking status: Never Smoker  . Smokeless tobacco: Never Used  Substance and Sexual Activity  . Alcohol use: No  . Drug use: No  . Sexual activity: Defer  Other Topics Concern  . Not on file  Social History Narrative  . Not on file      FAMILY HISTORY:  Family History  Family history unknown: Yes     GENERAL REVIEW OF SYSTEMS:   General ROS: negative for - chills, fatigue, fever, weight gain or weight loss Allergy and Immunology ROS: negative for - hives  Hematological and Lymphatic ROS: negative for - bleeding  problems or bruising, negative for palpable nodes Endocrine ROS: negative for - heat or cold intolerance, hair changes Respiratory ROS: negative for - cough, shortness of breath or wheezing Cardiovascular ROS: no chest pain or palpitations GI ROS: negative for nausea, vomiting, abdominal pain, diarrhea, constipation Musculoskeletal ROS: negative for - joint swelling or muscle pain Neurological ROS: negative for - confusion, syncope Dermatological ROS: negative for pruritus and rash Psychiatric: negative for anxiety, depression, difficulty sleeping and memory loss  PHYSICAL EXAM:     Vitals:   08/05/18 1527  BP: 135/82  Pulse: 89  .  Ht:162.6 cm (5' 4") Wt:78.9 kg (174 lb) BSA:Body surface area is 1.89 meters squared. Body mass index is 29.87 kg/m..   GENERAL: Alert, active, oriented x3  HEENT: Pupils equal reactive to light. Extraocular movements are intact. Sclera clear. Palpebral conjunctiva normal red color.Pharynx clear.  NECK: Supple with no palpable mass and no adenopathy.  LUNGS: Sound clear with no rales rhonchi or wheezes.  HEART: Regular rhythm S1 and S2 without murmur.  ABDOMEN: Soft and depressible, nontender with no palpable mass, no hepatomegaly.   EXTREMITIES: Well-developed well-nourished symmetrical with no dependent edema.  NEUROLOGICAL: Awake alert oriented, facial expression symmetrical, moving all extremities.      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 Evaluated by oncology and neoadjuvant chemotherapy and radiation therapy was recommended. Patient today came for evaluation of Chemo-Port placement.  I discussed with the patient the benefit of having the Chemo-Port.  Risks of surgery including pneumothorax, hemothorax, bleeding, infection, deep vein thrombosis, arteriovenous fistula, pain among others.  Other risk of anesthesia including pneumonia, cardiac complications were also discussed.  Patient  reports understand and agreed.         PLAN:  1.  Insertion of Port-A-Cath for chemotherapy (36561). 2.  CBC and CMP done on 08/04/2018 3.  Do not take aspirin 5 days before procedure 4.  Follow-up instructions for preadmission protocol. 5.  Contact us if has any question or concern.  Patient verbalized understanding, all questions were answered, and were agreeable with the plan outlined above.   This encounter was at least 40-minute, more of the time oriented the patient and coordinating plan of care  Brizza Nathanson Cintron-Diaz, MD  Electronically signed by Rori Goar Cintron-Diaz, MD  

## 2018-08-12 NOTE — H&P (Signed)
HISTORY OF PRESENT ILLNESS:    Willie [name] is a 78 y.o.male patient who comes for preoperative evaluation of insertion of Chemo-Port.  Patient with history of rectal bleeding and chronic anemia.  Diagnostic colonoscopy was done and was found with a rectal mass.  Patient also have a sigmoid mass.  Both masses were biopsied.  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  MRI shows a mass penetrated through the muscularis propria and there is mesorectal lymph node more than 5 mm.  Patient was evaluated by oncology and recommended neoadjuvant chemotherapy and radiation therapy.  Patient denies pain radiation.  No alleviating or aggravating factor.  Patient at this appointment understanding the current plan.      PAST MEDICAL HISTORY:      Past Medical History:  Diagnosis Date  . Colon cancer (CMS-HCC) 07/2018  . Dermatophytosis of nail 05/28/2013  . GERD (gastroesophageal reflux disease)   . Hematoma of neck left  . Hypercholesteremia   . Hypertension   . Prostate cancer (CMS-HCC)   . Type II or unspecified type diabetes mellitus without mention of complication, not stated as uncontrolled (CMS-HCC) 05/28/2013        PAST SURGICAL HISTORY:        Past Surgical History:  Procedure Laterality Date  . COLONOSCOPY  07/15/2018   Invasive colorectal adenocarcinoma/High grade dysplasiain an adenomatous lesion with villous features/Tubular adenoma/Repeat 71yrTKT  . CORONARY ANGIOPLASTY    . EGD  07/15/2018   Normal EGD/Hiatal hernia/No Repeat/TKT         MEDICATIONS:  EncounterMedications        Outpatient Encounter Medications as of 08/05/2018  Medication Sig Dispense Refill  . ferrous sulfate 325 (65 FE) MG EC tablet Take 1 tablet (325 mg total) by mouth daily with breakfast 90 tablet 1  . flash glucose scanning reader (FREESTYLE LIBRE 14 DAY READER) Misc Use 1 each as directed 1 each 0  . flash glucose sensor (FREESTYLE LIBRE 14  DAY SENSOR) Kit Use 1 kit 2 (two) times daily 6 each 1  . HUMALOG MIX 75-25 KWIKPEN 100 unit/mL (75-25) pen injector Inject 40 Units subcutaneously once daily 15 mL 6  . lisinopril (PRINIVIL,ZESTRIL) 5 MG tablet Take 1 tablet (5 mg total) by mouth once daily 90 tablet 3  . metoprolol succinate (TOPROL-XL) 50 MG XL tablet TAKE 1 TABLET BY MOUTH ONCE DAILY 90 tablet 3  . niacin (NIASPAN) 500 MG ER tablet Take 1 tablet (500 mg total) by mouth nightly 90 tablet 3  . pantoprazole (PROTONIX) 40 MG DR tablet Take 1 tablet (40 mg total) by mouth once daily 90 tablet 1  . traZODone (DESYREL) 50 MG tablet Take 1 tablet (50 mg total) by mouth nightly 90 tablet 1  . TRUEPLUS PEN NEEDLE 32 gauge x 5/32" Ndle USE AS DIRECTED. WITH PEN 100 each 1   No facility-administered encounter medications on file as of 08/05/2018.        ALLERGIES:   Patient has no known allergies.   SOCIAL HISTORY:  Social History          Socioeconomic History  . Marital status: Married    Spouse name: Not on file  . Number of children: Not on file  . Years of education: Not on file  . Highest education level: Not on file  Occupational History  . Not on file  Social Needs  . Financial resource strain: Not on file  . Food insecurity:  Worry: Not on file    Inability: Not on file  . Transportation needs:    Medical: Not on file    Non-medical: Not on file  Tobacco Use  . Smoking status: Never Smoker  . Smokeless tobacco: Never Used  Substance and Sexual Activity  . Alcohol use: No  . Drug use: No  . Sexual activity: Defer  Other Topics Concern  . Not on file  Social History Narrative  . Not on file      FAMILY HISTORY:  Family History  Family history unknown: Yes     GENERAL REVIEW OF SYSTEMS:   General ROS: negative for - chills, fatigue, fever, weight gain or weight loss Allergy and Immunology ROS: negative for - hives  Hematological and Lymphatic ROS: negative for - bleeding  problems or bruising, negative for palpable nodes Endocrine ROS: negative for - heat or cold intolerance, hair changes Respiratory ROS: negative for - cough, shortness of breath or wheezing Cardiovascular ROS: no chest pain or palpitations GI ROS: negative for nausea, vomiting, abdominal pain, diarrhea, constipation Musculoskeletal ROS: negative for - joint swelling or muscle pain Neurological ROS: negative for - confusion, syncope Dermatological ROS: negative for pruritus and rash Psychiatric: negative for anxiety, depression, difficulty sleeping and memory loss  PHYSICAL EXAM:     Vitals:   08/05/18 1527  BP: 135/82  Pulse: 89  .  Ht:162.6 cm (_0 ) Wt:78.9 kg (174 lb) ZOX:WRUE surface area is 1.89 meters squared. Body mass index is 29.87 kg/m.Marland Kitchen   GENERAL: Alert, active, oriented x3  HEENT: Pupils equal reactive to light. Extraocular movements are intact. Sclera clear. Palpebral conjunctiva normal red color.Pharynx clear.  NECK: Supple with no palpable mass and no adenopathy.  LUNGS: Sound clear with no rales rhonchi or wheezes.  HEART: Regular rhythm S1 and S2 without murmur.  ABDOMEN: Soft and depressible, nontender with no palpable mass, no hepatomegaly.   EXTREMITIES: Well-developed well-nourished symmetrical with no dependent edema.  NEUROLOGICAL: Awake alert oriented, facial expression symmetrical, moving all extremities.      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 Evaluated by oncology and neoadjuvant chemotherapy and radiation therapy was recommended. Patient today came for evaluation of Chemo-Port placement.  I discussed with the patient the benefit of having the Chemo-Port.  Risks of surgery including pneumothorax, hemothorax, bleeding, infection, deep vein thrombosis, arteriovenous fistula, pain among others.  Other risk of anesthesia including pneumonia, cardiac complications were also discussed.  Patient  reports understand and agreed.         PLAN:  1.  Insertion of Port-A-Cath for chemotherapy (45409). 2.  CBC and CMP done on 08/04/2018 3.  Do not take aspirin 5 days before procedure 4.  Follow-up instructions for preadmission protocol. 5.  Contact us if has any question or concern.  Patient verbalized understanding, all questions were answered, and were agreeable with the plan outlined above.   This encounter was at least 40-minute, more of the time oriented the patient and coordinating plan of care  [name], MD  Electronically signed by [name], MD

## 2018-08-13 ENCOUNTER — Other Ambulatory Visit: Payer: Medicare Other

## 2018-08-13 ENCOUNTER — Other Ambulatory Visit: Payer: Self-pay

## 2018-08-13 ENCOUNTER — Ambulatory Visit: Payer: Medicare Other | Admitting: Anesthesiology

## 2018-08-13 ENCOUNTER — Encounter: Payer: Self-pay | Admitting: *Deleted

## 2018-08-13 ENCOUNTER — Encounter
Admission: RE | Disposition: A | Discharge status: Home / Self Care | Payer: Self-pay | Source: Home / Self Care | Attending: General Surgery

## 2018-08-13 ENCOUNTER — Ambulatory Visit
Admission: RE | Admit: 2018-08-13 | Discharge: 2018-08-13 | Disposition: A | Discharge status: Home / Self Care | Payer: Medicare Other | Attending: General Surgery | Admitting: General Surgery

## 2018-08-13 ENCOUNTER — Ambulatory Visit: Payer: Medicare Other

## 2018-08-13 DIAGNOSIS — I10 Essential (primary) hypertension: Secondary | ICD-10-CM | POA: Diagnosis not present

## 2018-08-13 DIAGNOSIS — Z79899 Other long term (current) drug therapy: Secondary | ICD-10-CM | POA: Diagnosis not present

## 2018-08-13 DIAGNOSIS — E119 Type 2 diabetes mellitus without complications: Secondary | ICD-10-CM | POA: Diagnosis not present

## 2018-08-13 DIAGNOSIS — Z794 Long term (current) use of insulin: Secondary | ICD-10-CM | POA: Diagnosis not present

## 2018-08-13 DIAGNOSIS — D649 Anemia, unspecified: Secondary | ICD-10-CM | POA: Insufficient documentation

## 2018-08-13 DIAGNOSIS — Z95828 Presence of other vascular implants and grafts: Secondary | ICD-10-CM

## 2018-08-13 DIAGNOSIS — I2510 Atherosclerotic heart disease of native coronary artery without angina pectoris: Secondary | ICD-10-CM | POA: Diagnosis not present

## 2018-08-13 DIAGNOSIS — E7800 Pure hypercholesterolemia, unspecified: Secondary | ICD-10-CM | POA: Diagnosis not present

## 2018-08-13 DIAGNOSIS — K219 Gastro-esophageal reflux disease without esophagitis: Secondary | ICD-10-CM | POA: Diagnosis not present

## 2018-08-13 DIAGNOSIS — Z8546 Personal history of malignant neoplasm of prostate: Secondary | ICD-10-CM | POA: Insufficient documentation

## 2018-08-13 DIAGNOSIS — Z955 Presence of coronary angioplasty implant and graft: Secondary | ICD-10-CM | POA: Diagnosis not present

## 2018-08-13 DIAGNOSIS — C20 Malignant neoplasm of rectum: Secondary | ICD-10-CM | POA: Diagnosis present

## 2018-08-13 HISTORY — PX: PORTACATH PLACEMENT: SHX2246

## 2018-08-13 LAB — GLUCOSE, CAPILLARY
Glucose-Capillary: 119 mg/dL — ABNORMAL HIGH (ref 70–99)
Glucose-Capillary: 132 mg/dL — ABNORMAL HIGH (ref 70–99)

## 2018-08-13 SURGERY — INSERTION, TUNNELED CENTRAL VENOUS DEVICE, WITH PORT
Anesthesia: General

## 2018-08-13 MED ORDER — GLYCOPYRROLATE 0.2 MG/ML IJ SOLN
INTRAMUSCULAR | Status: DC | PRN
  Administered 2018-08-13: 0.2 mg via INTRAVENOUS

## 2018-08-13 MED ORDER — ONDANSETRON HCL 4 MG/2ML IJ SOLN
INTRAMUSCULAR | Status: AC
  Filled 2018-08-13: qty 2

## 2018-08-13 MED ORDER — FAMOTIDINE 20 MG PO TABS
20.0000 mg | ORAL_TABLET | Freq: Once | ORAL | Status: AC
  Administered 2018-08-13: 20 mg via ORAL

## 2018-08-13 MED ORDER — FENTANYL CITRATE (PF) 100 MCG/2ML IJ SOLN
INTRAMUSCULAR | Status: AC
  Filled 2018-08-13: qty 2

## 2018-08-13 MED ORDER — BUPIVACAINE-EPINEPHRINE (PF) 0.25% -1:200000 IJ SOLN
INTRAMUSCULAR | Status: AC
  Filled 2018-08-13: qty 30

## 2018-08-13 MED ORDER — LIDOCAINE 2% (20 MG/ML) 5 ML SYRINGE
INTRAMUSCULAR | Status: DC | PRN
  Administered 2018-08-13: 50 mg via INTRAVENOUS

## 2018-08-13 MED ORDER — ONDANSETRON HCL 4 MG/2ML IJ SOLN: 4.0000 mg | Freq: Once | INTRAMUSCULAR | Status: DC | PRN

## 2018-08-13 MED ORDER — PROPOFOL 10 MG/ML IV BOLUS
INTRAVENOUS | Status: AC
  Filled 2018-08-13: qty 20

## 2018-08-13 MED ORDER — HEPARIN SODIUM (PORCINE) 5000 UNIT/ML IJ SOLN
INTRAMUSCULAR | Status: AC
  Filled 2018-08-13: qty 1

## 2018-08-13 MED ORDER — KETAMINE HCL 50 MG/ML IJ SOLN
INTRAMUSCULAR | Status: DC | PRN
  Administered 2018-08-13: 25 mg via INTRAMUSCULAR
  Administered 2018-08-13: 25 mg via INTRAVENOUS

## 2018-08-13 MED ORDER — SODIUM CHLORIDE 0.9 % IV SOLN
INTRAVENOUS | Status: DC | PRN
  Administered 2018-08-13: 08:00:00 10 mL via INTRAMUSCULAR

## 2018-08-13 MED ORDER — SODIUM CHLORIDE (PF) 0.9 % IJ SOLN
INTRAMUSCULAR | Status: AC
  Filled 2018-08-13: qty 50

## 2018-08-13 MED ORDER — FENTANYL CITRATE (PF) 100 MCG/2ML IJ SOLN: 25.0000 ug | INTRAMUSCULAR | Status: DC | PRN

## 2018-08-13 MED ORDER — PROPOFOL 500 MG/50ML IV EMUL
INTRAVENOUS | Status: DC | PRN
  Administered 2018-08-13: 25 ug/kg/min via INTRAVENOUS

## 2018-08-13 MED ORDER — BUPIVACAINE-EPINEPHRINE (PF) 0.25% -1:200000 IJ SOLN
INTRAMUSCULAR | Status: DC | PRN
  Administered 2018-08-13: 7 mL

## 2018-08-13 MED ORDER — ONDANSETRON HCL 4 MG/2ML IJ SOLN
INTRAMUSCULAR | Status: DC | PRN
  Administered 2018-08-13: 4 mg via INTRAVENOUS

## 2018-08-13 MED ORDER — FAMOTIDINE 20 MG PO TABS
ORAL_TABLET | ORAL | Status: AC
  Filled 2018-08-13: qty 1

## 2018-08-13 MED ORDER — CEFAZOLIN SODIUM-DEXTROSE 2-4 GM/100ML-% IV SOLN
INTRAVENOUS | Status: AC
  Filled 2018-08-13: qty 100

## 2018-08-13 MED ORDER — FENTANYL CITRATE (PF) 100 MCG/2ML IJ SOLN
INTRAMUSCULAR | Status: DC | PRN
  Administered 2018-08-13: 25 ug via INTRAVENOUS
  Administered 2018-08-13: 50 ug via INTRAVENOUS
  Administered 2018-08-13: 25 ug via INTRAVENOUS

## 2018-08-13 MED ORDER — KETAMINE HCL 50 MG/ML IJ SOLN
INTRAMUSCULAR | Status: AC
  Filled 2018-08-13: qty 10

## 2018-08-13 MED ORDER — LIDOCAINE HCL (PF) 2 % IJ SOLN
INTRAMUSCULAR | Status: AC
  Filled 2018-08-13: qty 10

## 2018-08-13 MED ORDER — GLYCOPYRROLATE 0.2 MG/ML IJ SOLN
INTRAMUSCULAR | Status: AC
  Filled 2018-08-13: qty 1

## 2018-08-13 MED ORDER — PHENYLEPHRINE HCL (PRESSORS) 10 MG/ML IV SOLN
INTRAVENOUS | Status: AC
  Filled 2018-08-13: qty 1

## 2018-08-13 MED ORDER — SODIUM CHLORIDE 0.9 % IV SOLN
INTRAVENOUS | Status: DC
  Administered 2018-08-13: 07:00:00 via INTRAVENOUS

## 2018-08-13 MED ORDER — HYDROCODONE-ACETAMINOPHEN 5-325 MG PO TABS: 1.0000 | ORAL_TABLET | ORAL | 0 refills | Status: AC | PRN

## 2018-08-13 SURGICAL SUPPLY — 31 items
BLADE SURG 11 STRL SS SAFETY (MISCELLANEOUS) ×3 IMPLANT
CANISTER SUCT 1200ML W/VALVE (MISCELLANEOUS) ×3 IMPLANT
CHLORAPREP W/TINT 26 (MISCELLANEOUS) ×3 IMPLANT
COVER LIGHT HANDLE STERIS (MISCELLANEOUS) ×6 IMPLANT
COVER WAND RF STERILE (DRAPES) ×3 IMPLANT
DERMABOND ADVANCED (GAUZE/BANDAGES/DRESSINGS) ×2
DERMABOND ADVANCED .7 DNX12 (GAUZE/BANDAGES/DRESSINGS) ×1 IMPLANT
DRAPE C-ARM XRAY 36X54 (DRAPES) ×3 IMPLANT
ELECT REM PT RETURN 9FT ADLT (ELECTROSURGICAL) ×3
ELECTRODE REM PT RTRN 9FT ADLT (ELECTROSURGICAL) ×1 IMPLANT
GLOVE BIO SURGEON STRL SZ 6.5 (GLOVE) ×2 IMPLANT
GLOVE BIO SURGEONS STRL SZ 6.5 (GLOVE) ×1
GLOVE INDICATOR 6.5 STRL GRN (GLOVE) ×3 IMPLANT
GOWN STRL REUS W/ TWL LRG LVL3 (GOWN DISPOSABLE) ×3 IMPLANT
GOWN STRL REUS W/TWL LRG LVL3 (GOWN DISPOSABLE) ×6
KIT PORT POWER 8FR ISP CVUE (Port) ×3 IMPLANT
KIT TURNOVER KIT A (KITS) ×3 IMPLANT
LABEL OR SOLS (LABEL) ×3 IMPLANT
NEEDLE FILTER BLUNT 18X 1/2SAF (NEEDLE) ×2
NEEDLE FILTER BLUNT 18X1 1/2 (NEEDLE) ×1 IMPLANT
PACK PORT-A-CATH (MISCELLANEOUS) ×3 IMPLANT
SUT MNCRL AB 4-0 PS2 18 (SUTURE) ×3 IMPLANT
SUT PROLENE 2-0 (SUTURE) ×2
SUT PROLENE 2-0 RB1 36X2 ARM (SUTURE) ×1
SUT VIC AB 2-0 SH 27 (SUTURE) ×2
SUT VIC AB 2-0 SH 27XBRD (SUTURE) ×1 IMPLANT
SUT VIC AB 3-0 SH 27 (SUTURE) ×2
SUT VIC AB 3-0 SH 27X BRD (SUTURE) ×1 IMPLANT
SUTURE PROLEN 2-0 RB1 36X2 ARM (SUTURE) ×1 IMPLANT
SYR 10ML LL (SYRINGE) ×3 IMPLANT
SYR 3ML LL SCALE MARK (SYRINGE) ×6 IMPLANT

## 2018-08-13 NOTE — Interval H&P Note (Signed)
History and Physical Interval Note:  08/13/2018 6:57 AM  Willie [name]  has presented today for surgery, with the diagnosis of C20 RECTAL CANCER.  The various methods of treatment have been discussed with the patient and family. After consideration of risks, benefits and other options for treatment, the patient has consented to  Procedure(s): INSERTION PORT-A-CATH (N/A) as a surgical intervention.  The patient's history has been reviewed, patient examined, no change in status, stable for surgery.  I have reviewed the patient's chart and labs.  Questions were answered to the patient's satisfaction.     [name]

## 2018-08-13 NOTE — Anesthesia Post-op Follow-up Note (Signed)
Anesthesia QCDR form completed.        

## 2018-08-13 NOTE — Anesthesia Preprocedure Evaluation (Signed)
Anesthesia Evaluation  Patient identified by MRN, date of birth, ID band Patient awake    Reviewed: Allergy & Precautions, NPO status , Patient's Chart, lab work & pertinent test results, reviewed documented beta blocker date and time   Airway Mallampati: III  TM Distance: >3 FB     Dental  (+) Chipped   Pulmonary           Cardiovascular hypertension, Pt. on medications and Pt. on home beta blockers + CAD       Neuro/Psych    GI/Hepatic   Endo/Other  diabetes, Type 2  Renal/GU      Musculoskeletal   Abdominal   Peds  Hematology  (+) anemia ,   Anesthesia Other Findings   Reproductive/Obstetrics                             Anesthesia Physical Anesthesia Plan  ASA: III  Anesthesia Plan: MAC   Post-op Pain Management:    Induction: Intravenous  PONV Risk Score and Plan:   Airway Management Planned:   Additional Equipment:   Intra-op Plan:   Post-operative Plan:   Informed Consent: I have reviewed the patients History and Physical, chart, labs and discussed the procedure including the risks, benefits and alternatives for the proposed anesthesia with the patient or authorized representative who has indicated his/her understanding and acceptance.       Plan Discussed with: CRNA  Anesthesia Plan Comments:         Anesthesia Quick Evaluation

## 2018-08-13 NOTE — Transfer of Care (Signed)
Immediate Anesthesia Transfer of Care Note  Patient: LOYE VENTO  Procedure(s) Performed: INSERTION PORT-A-CATH (N/A )  Patient Location: PACU  Anesthesia Type:General  Level of Consciousness: awake, alert  and oriented  Airway & Oxygen Therapy: Patient Spontanous Breathing and Patient connected to nasal cannula oxygen  Post-op Assessment: Report given to RN and Post -op Vital signs reviewed and stable  Post vital signs: Reviewed  Last Vitals:  Vitals Value Taken Time  BP 148/76 08/13/2018  8:29 AM  Temp    Pulse 60 08/13/2018  8:29 AM  Resp 12 08/13/2018  8:29 AM  SpO2 100 % 08/13/2018  8:29 AM  Vitals shown include unvalidated device data.  Last Pain:  Vitals:   08/13/18 0630  TempSrc: Temporal  PainSc: 0-No pain         Complications: No apparent anesthesia complications

## 2018-08-13 NOTE — Anesthesia Postprocedure Evaluation (Signed)
Anesthesia Post Note  Patient: Willie [name]  Procedure(s) Performed: INSERTION PORT-A-CATH (N/A )  Patient location during evaluation: PACU Anesthesia Type: General Level of consciousness: awake and alert Pain management: pain level controlled Vital Signs Assessment: post-procedure vital signs reviewed and stable Respiratory status: spontaneous breathing, nonlabored ventilation, respiratory function stable and patient connected to nasal cannula oxygen Cardiovascular status: blood pressure returned to baseline and stable Postop Assessment: no apparent nausea or vomiting Anesthetic complications: no     Last Vitals:  Vitals:   08/13/18 0859 08/13/18 0920  BP: (!) 172/71 121/85  Pulse: (!) 59 (!) 52  Resp: (!) 28 12  Temp:    SpO2: 100% 100%    Last Pain:  Vitals:   08/13/18 0920  TempSrc:   PainSc: 0-No pain                 Latishia Suitt S

## 2018-08-13 NOTE — Discharge Instructions (Signed)
°  Diet: Resume home heart healthy regular diet.  ° °Activity: Increase activity as tolerated. Light activity and walking are encouraged. Do not drive or drink alcohol if taking narcotic pain medications. ° °Wound care: May shower with soapy water and pat dry (do not rub incisions), but no baths or submerging incision underwater until follow-up. (no swimming)  ° °Medications: Resume all home medications. For mild to moderate pain: acetaminophen (Tylenol) or ibuprofen (if no kidney disease). Combining Tylenol with alcohol can substantially increase your risk of causing liver disease. Narcotic pain medications, if prescribed, can be used for severe pain, though may cause nausea, constipation, and drowsiness. Do not combine Tylenol and Norco within a 6 hour period as Norco contains Tylenol. If you do not need the narcotic pain medication, you do not need to fill the prescription. ° °Call office ([phone]) at any time if any questions, worsening pain, fevers/chills, bleeding, drainage from incision site, or other concerns. ° °AMBULATORY SURGERY  °DISCHARGE INSTRUCTIONS ° ° °1) The drugs that you were given will stay in your system until tomorrow so for the next 24 hours you should not: ° °A) Drive an automobile °B) Make any legal decisions °C) Drink any alcoholic beverage ° ° °2) You may resume regular meals tomorrow.  Today it is better to start with liquids and gradually work up to solid foods. ° °You may eat anything you prefer, but it is better to start with liquids, then soup and crackers, and gradually work up to solid foods. ° ° °3) Please notify your doctor immediately if you have any unusual bleeding, trouble breathing, redness and pain at the surgery site, drainage, fever, or pain not relieved by medication. ° ° ° °4) Additional Instructions: ° ° ° ° ° ° ° °Please contact your physician with any problems or Same Day Surgery at [phone], Monday through Friday 6 am to 4 pm, or [place] [facility]  number at [phone]. °

## 2018-08-13 NOTE — Op Note (Signed)
SURGICAL PROCEDURE REPORT  DATE OF PROCEDURE: 08/13/2018   SURGEON: Dr. Windell Moment   ANESTHESIA: Local with light IV sedation   PRE-OPERATIVE DIAGNOSIS: Advanced Rectal cancer requiring durable central venous access for chemotherapy   POST-OPERATIVE DIAGNOSIS: Advanced Rectal cancer requiring durable central venous access for chemotherapy   PROCEDURE(S):  1.) Percutaneous access of Right internal jugular vein under ultrasound guidance 2.) Insertion of tunneled Right internal jugular central venous catheter with subcutaneous port  INTRAOPERATIVE FINDINGS: Patent easily compressible Right internal jugular vein with appropriate respiratory variations and well-secured tunneled central venous catheter with subcutaneous port at completion of the procedure  ESTIMATED BLOOD LOSS: Minimal (<20 mL)   SPECIMENS: None   IMPLANTS: 75F tunneled Bard PowerPort central venous catheter with subcutaneous port  DRAINS: None   COMPLICATIONS: None apparent   CONDITION AT COMPLETION: Hemodynamically stable, awake   DISPOSITION: PACU   INDICATION(S) FOR PROCEDURE:  Patient is a 78 y.o. male who presented with advanced rectal cancer requiring durable central venous access for chemotherapy. All risks, benefits, and alternatives to above elective procedures were discussed with the patient, who elected to proceed, and informed consent was accordingly obtained at that time.  DETAILS OF PROCEDURE:  Patient was brought to the operative suite and appropriately identified. In Trendelenburg position, Right IJ venous access site was prepped and draped in the usual sterile fashion, and following a brief timeout, percutaneous Right IJ venous access was obtained under ultrasound guidance using Seldinger technique, by which local anesthetic was injected over the Right IJ vein, and access needle was inserted under direct ultrasound visualization into the Right IJ vein, through which soft guidewire was advanced, over  which access needle was withdrawn. Guidewire was secured, attention was directed to injection of local anesthetic along the planned tunnel site, 2-3 cm transverse Right chest incision was made and confirmed to accommodate the subcutaneous port, and flushed catheter was tunneled retrograde from the port site over the Right chest to the Right IJ access site with the attached port well-secured to the catheter and within the subcutaneous pocket. Insertion sheath was advanced over the guidewire, which was withdrawn along with the insertion sheath dilator. The catheter was introduced through the sheath and left on the [place] junction under fluoro guidance and catheter cut to desire lenght. Catheter connected to port and fixed to the pocket on two side to avoid twisting. Port was confirmed to withdraw blood and flush easily, after which concentrated heparin was instilled into the port and catheter. Dermis at the subcutaneous pocket was re-approximated using buried interrupted 3-0 Vicryl suture, and 4-0 Monocryl suture was used to re-approximate skin at the insertion and subcutaneous port sites in running subcuticular fashion for the subcutaneous port and buried interrupted fashion for the insertion site. Skin was cleaned, dried, and sterile skin glue was applied. Patient was then safely transferred to PACU for a chest x-ray. Ultrasound images are available on paper chart and Fluoroscopy guidance images are available in Epic.

## 2018-08-17 ENCOUNTER — Encounter: Payer: Self-pay | Admitting: Oncology

## 2018-08-17 ENCOUNTER — Inpatient Hospital Stay: Payer: Medicare Other

## 2018-08-17 ENCOUNTER — Other Ambulatory Visit: Payer: Self-pay

## 2018-08-17 ENCOUNTER — Inpatient Hospital Stay (HOSPITAL_BASED_OUTPATIENT_CLINIC_OR_DEPARTMENT_OTHER): Payer: Medicare Other | Admitting: Oncology

## 2018-08-17 VITALS — BP 146/76 | HR 67 | Temp 95.6°F | Wt 171.0 lb

## 2018-08-17 VITALS — BP 146/67 | HR 56 | Temp 96.0°F

## 2018-08-17 DIAGNOSIS — I10 Essential (primary) hypertension: Secondary | ICD-10-CM

## 2018-08-17 DIAGNOSIS — C20 Malignant neoplasm of rectum: Secondary | ICD-10-CM

## 2018-08-17 DIAGNOSIS — N179 Acute kidney failure, unspecified: Secondary | ICD-10-CM | POA: Diagnosis not present

## 2018-08-17 DIAGNOSIS — D701 Agranulocytosis secondary to cancer chemotherapy: Secondary | ICD-10-CM | POA: Diagnosis not present

## 2018-08-17 DIAGNOSIS — G893 Neoplasm related pain (acute) (chronic): Secondary | ICD-10-CM | POA: Diagnosis not present

## 2018-08-17 DIAGNOSIS — D509 Iron deficiency anemia, unspecified: Secondary | ICD-10-CM | POA: Diagnosis not present

## 2018-08-17 DIAGNOSIS — E119 Type 2 diabetes mellitus without complications: Secondary | ICD-10-CM | POA: Diagnosis not present

## 2018-08-17 DIAGNOSIS — Z5111 Encounter for antineoplastic chemotherapy: Secondary | ICD-10-CM | POA: Diagnosis not present

## 2018-08-17 DIAGNOSIS — Z794 Long term (current) use of insulin: Secondary | ICD-10-CM

## 2018-08-17 DIAGNOSIS — T451X5A Adverse effect of antineoplastic and immunosuppressive drugs, initial encounter: Secondary | ICD-10-CM | POA: Diagnosis not present

## 2018-08-17 DIAGNOSIS — E7800 Pure hypercholesterolemia, unspecified: Secondary | ICD-10-CM | POA: Insufficient documentation

## 2018-08-17 DIAGNOSIS — Z95828 Presence of other vascular implants and grafts: Secondary | ICD-10-CM

## 2018-08-17 DIAGNOSIS — Z79899 Other long term (current) drug therapy: Secondary | ICD-10-CM | POA: Diagnosis not present

## 2018-08-17 DIAGNOSIS — D500 Iron deficiency anemia secondary to blood loss (chronic): Secondary | ICD-10-CM

## 2018-08-17 LAB — CBC WITH DIFFERENTIAL/PLATELET
Abs Immature Granulocytes: 0.01 10*3/uL (ref 0.00–0.07)
Basophils Absolute: 0 10*3/uL (ref 0.0–0.1)
Basophils Relative: 0 %
Eosinophils Absolute: 0.2 10*3/uL (ref 0.0–0.5)
Eosinophils Relative: 3 %
HCT: 34.8 % — ABNORMAL LOW (ref 39.0–52.0)
Hemoglobin: 11.3 g/dL — ABNORMAL LOW (ref 13.0–17.0)
Immature Granulocytes: 0 %
Lymphocytes Relative: 31 %
Lymphs Abs: 1.7 10*3/uL (ref 0.7–4.0)
MCH: 26.5 pg (ref 26.0–34.0)
MCHC: 32.5 g/dL (ref 30.0–36.0)
MCV: 81.7 fL (ref 80.0–100.0)
Monocytes Absolute: 0.5 10*3/uL (ref 0.1–1.0)
Monocytes Relative: 9 %
Neutro Abs: 3 10*3/uL (ref 1.7–7.7)
Neutrophils Relative %: 57 %
Platelets: 225 10*3/uL (ref 150–400)
RBC: 4.26 MIL/uL (ref 4.22–5.81)
RDW: 14.4 % (ref 11.5–15.5)
WBC: 5.4 10*3/uL (ref 4.0–10.5)
nRBC: 0 % (ref 0.0–0.2)

## 2018-08-17 LAB — COMPREHENSIVE METABOLIC PANEL
ALT: 17 U/L (ref 0–44)
AST: 21 U/L (ref 15–41)
Albumin: 3.4 g/dL — ABNORMAL LOW (ref 3.5–5.0)
Alkaline Phosphatase: 75 U/L (ref 38–126)
Anion gap: 7 (ref 5–15)
BUN: 13 mg/dL (ref 8–23)
CO2: 23 mmol/L (ref 22–32)
Calcium: 8.6 mg/dL — ABNORMAL LOW (ref 8.9–10.3)
Chloride: 106 mmol/L (ref 98–111)
Creatinine, Ser: 1.06 mg/dL (ref 0.61–1.24)
GFR calc Af Amer: >60 mL/min (ref >60)
GFR calc non Af Amer: >60 mL/min (ref >60)
Glucose, Bld: 93 mg/dL (ref 70–99)
Potassium: 3.8 mmol/L (ref 3.5–5.1)
Sodium: 136 mmol/L (ref 135–145)
Total Bilirubin: 0.4 mg/dL (ref 0.3–1.2)
Total Protein: 6.8 g/dL (ref 6.5–8.1)

## 2018-08-17 MED ORDER — SODIUM CHLORIDE 0.9 % IV SOLN
Freq: Once | INTRAVENOUS | Status: AC
  Administered 2018-08-17: 11:00:00 via INTRAVENOUS
  Filled 2018-08-17: qty 250

## 2018-08-17 MED ORDER — LEUCOVORIN CALCIUM INJECTION 350 MG
395.0000 mg/m2 | Freq: Once | INTRAVENOUS | Status: AC
  Administered 2018-08-17: 750 mg via INTRAVENOUS
  Filled 2018-08-17: qty 37.5

## 2018-08-17 MED ORDER — SODIUM CHLORIDE 0.9 % IV SOLN
510.0000 mg | Freq: Once | INTRAVENOUS | Status: AC
  Administered 2018-08-17: 510 mg via INTRAVENOUS
  Filled 2018-08-17: qty 17

## 2018-08-17 MED ORDER — OXYCODONE HCL 5 MG PO TABS: 5.0000 mg | ORAL_TABLET | Freq: Four times a day (QID) | ORAL | 0 refills | Status: DC | PRN

## 2018-08-17 MED ORDER — SODIUM CHLORIDE 0.9 % IV SOLN: 10.0000 mg | Freq: Once | INTRAVENOUS | Status: DC

## 2018-08-17 MED ORDER — OXALIPLATIN CHEMO INJECTION 100 MG/20ML
79.0000 mg/m2 | Freq: Once | INTRAVENOUS | Status: AC
  Administered 2018-08-17: 150 mg via INTRAVENOUS
  Filled 2018-08-17: qty 10

## 2018-08-17 MED ORDER — SODIUM CHLORIDE 0.9 % IV SOLN
2400.0000 mg/m2 | INTRAVENOUS | Status: DC
  Administered 2018-08-17: 4550 mg via INTRAVENOUS
  Filled 2018-08-17: qty 91

## 2018-08-17 MED ORDER — FLUOROURACIL CHEMO INJECTION 2.5 GM/50ML
400.0000 mg/m2 | Freq: Once | INTRAVENOUS | Status: AC
  Administered 2018-08-17: 750 mg via INTRAVENOUS
  Filled 2018-08-17: qty 15

## 2018-08-17 MED ORDER — DEXAMETHASONE SODIUM PHOSPHATE 10 MG/ML IJ SOLN
10.0000 mg | Freq: Once | INTRAMUSCULAR | Status: AC
  Administered 2018-08-17: 10 mg via INTRAVENOUS
  Filled 2018-08-17: qty 1

## 2018-08-17 MED ORDER — PALONOSETRON HCL INJECTION 0.25 MG/5ML
0.2500 mg | Freq: Once | INTRAVENOUS | Status: AC
  Administered 2018-08-17: 0.25 mg via INTRAVENOUS
  Filled 2018-08-17: qty 5

## 2018-08-17 MED ORDER — SODIUM CHLORIDE 0.9% FLUSH
10.0000 mL | Freq: Once | INTRAVENOUS | Status: AC
  Administered 2018-08-17: 10 mL via INTRAVENOUS
  Filled 2018-08-17: qty 10

## 2018-08-17 MED ORDER — DEXTROSE 5 % IV SOLN
Freq: Once | INTRAVENOUS | Status: AC
  Administered 2018-08-17: 12:00:00 via INTRAVENOUS
  Filled 2018-08-17: qty 250

## 2018-08-17 NOTE — Progress Notes (Signed)
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  He was recently found to have iron deficiency anemia which led to a colonoscopy On 07/15/2018.  Colonoscopy showed a villous partially obstructing medium-sized mass in the mid sigmoid colon.  The mass was partially circumferential measuring 5 cm.  There was another infiltrative partially obstructing medium-sized mass found in the rectosigmoid colon which also measured 5 cm.  Pathology from the sigmoid colon mass showed high-grade dysplasia at least involving an adenomatous lesion with villous features.  A more serious process is not excluded.  Rectosigmoid mass biopsy showed invasive colorectal adenocarcinoma.  Patient was referred to Dr. [name] for surgical management.  Patient had a CT abdomen and pelvis with contrast which showed irregular asymmetric mural thickening of the proximal rectum measuring over 6 to 7 cm.  Potential local nodal metastases with several subcentimeter iliac lymph nodes and presacral lymph nodes.  No evidence of metastatic disease outside the pelvis.  Patient also had MRI of the pelvis with and without contrast which showed tumor length 8.5 cm with extension through muscularis propria decreased C.  There is diffuse involvement of muscularis propria.  No extramural vascular invasion or tumor thrombus.  Shortest distance of any  tumor/note from mesorectal fascia 1 to 2 mm.  No involvement of adjacent organs or pelvic sidewall.  Mesorectal lymph nodes within perirectal fat including 6 mm node and a low sigmoid mesocolon node measuring 6 mm.  Another 6 mm node within the low sigmoid mesocolon representing extremity rectal lymphadenopathy.  T3N2 by MRI.  Distance from tumor to internal anal sphincter is 6.2 cm.  Plan is to proceed with total neoadjuvant chemotherapy followed by definitive surgery.  Interval history-he does report some pain and discomfort when he sits down.  He had been taking ibuprofen but that has not been helping.  Denies other complaints  ECOG PS- 0 Pain scale- 0 Opioid associated constipation- no  Review of systems- Review of Systems  Constitutional: Negative for chills, fever, malaise/fatigue and weight loss.  HENT: Negative for congestion, ear discharge and nosebleeds.   Eyes: Negative for blurred vision.  Respiratory: Negative for cough, hemoptysis, sputum production, shortness of breath and wheezing.   Cardiovascular: Negative for chest pain, palpitations, orthopnea and claudication.  Gastrointestinal: Negative for abdominal pain, blood in stool, constipation, diarrhea, heartburn, melena, nausea and vomiting.  Genitourinary: Negative for dysuria, flank pain, frequency, hematuria and urgency.  Musculoskeletal: Negative for back pain, joint pain and myalgias.  Skin: Negative for rash.  Neurological: Negative for dizziness, tingling, focal weakness, seizures, weakness and headaches.  Endo/Heme/Allergies: Does not bruise/bleed easily.  Psychiatric/Behavioral: Negative for depression and suicidal ideas. The patient does not have insomnia.        No Known Allergies   Past Medical History:  Diagnosis Date   Anemia    Cancer ([place])    Coronary artery disease    Diabetes mellitus without complication ([place])    History of hiatal hernia  Hypercholesterolemia    Hypertension       Past Surgical History:  Procedure Laterality Date   COLONOSCOPY WITH PROPOFOL N/A 07/15/2018   Procedure: COLONOSCOPY WITH PROPOFOL;  Surgeon: [name], [name], MD;  Location: [facility] ENDOSCOPY;  Service: Gastroenterology;  Laterality: N/A;   ESOPHAGOGASTRODUODENOSCOPY (EGD) WITH PROPOFOL N/A 07/15/2018   Procedure: ESOPHAGOGASTRODUODENOSCOPY (EGD) WITH PROPOFOL;  Surgeon: [name], [name], MD;  Location: [facility] ENDOSCOPY;  Service: Gastroenterology;  Laterality: N/A;   PROSTATE SURGERY      Social History   Socioeconomic History   Marital status: Married    Spouse name: Not on file   Number of children: Not on file   Years of education: Not on file   Highest education level: Not on file  Occupational History   Not on file  Social Needs   Financial resource strain: Not hard at all   Food insecurity:    Worry: Never true    Inability: Never true   Transportation needs:    Medical: No    Non-medical: No  Tobacco Use   Smoking status: Never Smoker   Smokeless tobacco: Never Used  Substance and Sexual Activity   Alcohol use: Not Currently   Drug use: Never   Sexual activity: Not on file  Lifestyle   Physical activity:    Days per week: 2 days    Minutes per session: 30 min   Stress: Only a little  Relationships   Social connections:    Talks on phone: Not on file    Gets together: Not on file    Attends religious service: Not on file    Active member of club or organization: Not on file    Attends meetings of clubs or organizations: Not on file    Relationship status: Widowed   Intimate partner violence:    Fear of current or ex partner: Patient refused    Emotionally abused: Patient refused    Physically abused: Patient refused    Forced sexual activity: Patient refused  Other Topics Concern   Not on file  Social History Narrative   Not on file    No family history on file.   Current Outpatient Medications:    dexamethasone (DECADRON)  4 MG tablet, Take 2 tablets (8 mg total) by mouth daily. Start the day after chemotherapy for 2 days. Take with food. (Patient not taking: Reported on 08/13/2018), Disp: 30 tablet, Rfl: 1   ezetimibe (ZETIA) 10 MG tablet, Take 10 mg by mouth every morning. , Disp: , Rfl:    ferrous sulfate 325 (65 FE) MG tablet, Take 325 mg by mouth daily., Disp: , Rfl:    HUMALOG MIX 75/25 KWIKPEN (75-25) 100 UNIT/ML Kwikpen, Inject 30 Units into the skin every morning. , Disp: , Rfl:    lidocaine-prilocaine (EMLA) cream, Apply to affected area once (Patient not taking: Reported on 08/13/2018), Disp: 30 g, Rfl: 3   lisinopril (PRINIVIL,ZESTRIL) 5 MG tablet, Take 5 mg by mouth every morning. , Disp: , Rfl:    metoprolol succinate (TOPROL-XL) 50 MG 24 hr tablet, Take 50 mg by mouth every morning. , Disp: , Rfl:    niacin (NIASPAN) 500 MG CR tablet, Take 500 mg by mouth every evening. , Disp: , Rfl:    ondansetron (ZOFRAN) 8 MG tablet, Take 1 tablet (8 mg total) by mouth 2 (two) times daily as needed for refractory nausea / vomiting. Start on day 3 after chemotherapy. (Patient not taking: Reported on 08/13/2018), Disp: 30  tablet, Rfl: 1   pantoprazole (PROTONIX) 40 MG tablet, Take 40 mg by mouth every morning. , Disp: , Rfl:    prochlorperazine (COMPAZINE) 10 MG tablet, Take 1 tablet (10 mg total) by mouth every 6 (six) hours as needed (Nausea or vomiting). (Patient not taking: Reported on 08/13/2018), Disp: 30 tablet, Rfl: 1   traZODone (DESYREL) 50 MG tablet, Take 50 mg by mouth at bedtime as needed for sleep. , Disp: , Rfl:   Physical exam:  Vitals:   08/17/18 0855  BP: (!) 146/76  Pulse: 67  Temp: (!) 95.6 F (35.3 C)  TempSrc: Tympanic  Weight: 171 lb (77.6 kg)   Physical Exam HENT:     Head: Normocephalic and atraumatic.  Eyes:     Pupils: Pupils are equal, round, and reactive to light.  Neck:     Musculoskeletal: Normal range of motion.  Cardiovascular:     Rate and Rhythm: Normal rate and  regular rhythm.     Heart sounds: Normal heart sounds.  Pulmonary:     Effort: Pulmonary effort is normal.     Breath sounds: Normal breath sounds.  Abdominal:     General: Bowel sounds are normal.     Palpations: Abdomen is soft.  Skin:    General: Skin is warm and dry.  Neurological:     Mental Status: He is alert and oriented to person, place, and time.      CMP Latest Ref Rng & Units 08/04/2018  Glucose 70 - 99 mg/dL 123(H)  BUN 8 - 23 mg/dL 15  Creatinine 0.61 - 1.24 mg/dL 1.26(H)  Sodium 135 - 145 mmol/L 140  Potassium 3.5 - 5.1 mmol/L 4.7  Chloride 98 - 111 mmol/L 107  CO2 22 - 32 mmol/L 26  Calcium 8.9 - 10.3 mg/dL 9.1  Total Protein 6.5 - 8.1 g/dL 7.1  Total Bilirubin 0.3 - 1.2 mg/dL 0.4  Alkaline Phos 38 - 126 U/L 78  AST 15 - 41 U/L 17  ALT 0 - 44 U/L 15   CBC Latest Ref Rng & Units 08/04/2018  WBC 4.0 - 10.5 K/uL 5.8  Hemoglobin 13.0 - 17.0 g/dL 12.1(L)  Hematocrit 39.0 - 52.0 % 37.6(L)  Platelets 150 - 400 K/uL 273    No images are attached to the encounter.  Ct Chest W Contrast  Result Date: 08/07/2018 CLINICAL DATA:  Preoperative evaluation for insertion of Port-A-Cath. Rectal cancer. EXAM: CT CHEST WITH CONTRAST TECHNIQUE: Multidetector CT imaging of the chest was performed during intravenous contrast administration. CONTRAST:  64mL OMNIPAQUE IOHEXOL 300 MG/ML  SOLN COMPARISON:  Chest x-ray 10/31/2017. FINDINGS: Cardiovascular: Scattered descending aortic calcifications. Moderate coronary artery calcifications in the left anterior descending and left circumflex coronary arteries. Heart is normal size. Aorta is normal caliber. Mediastinum/Nodes: No mediastinal, hilar, or axillary adenopathy. Trachea and esophagus are unremarkable. Thyroid unremarkable. Lungs/Pleura: Lungs are clear. No focal airspace opacities or suspicious nodules. No effusions. Upper Abdomen: Imaging into the upper abdomen shows no acute findings. Diverticular disease seen within the visualized  transverse colon. Musculoskeletal: Chest wall soft tissues are unremarkable. No acute bony abnormality. IMPRESSION: No acute cardiopulmonary disease. Coronary artery disease. Aortic Atherosclerosis (ICD10-I70.0). Electronically Signed   By: Rolm Baptise M.D.   On: 08/07/2018 16:37   Mr Pelvis W HB Contrast  Result Date: 08/04/2018 CLINICAL DATA:  Rectal cancer. Sigmoid and rectosigmoid junction masses. EXAM: MRI PELVIS WITHOUT AND WITH CONTRAST TECHNIQUE: Multiplanar multisequence MR imaging of the pelvis was performed both before and  after administration of intravenous contrast. Small amount of [place] gel was administered per rectum to optimize tumor evaluation. CONTRAST:  7 cc of Gadavist COMPARISON:  CT 07/23/2018.  Colonoscopy report of 07/15/2018. FINDINGS: TUMOR LOCATION Tumor distance from Anal Verge/Skin Surface: 13.3 cm, including on image 20/2. Tumor distance from Internal Anal Sphincter: 6.2 cm, including on image 20/7. TUMOR DESCRIPTION Circumferential Extent: Circumferential, 360 degrees Tumor Length: 8.5 cm, including image 18/2. T - CATEGORY Extension through Muscularis Propria: Yes 6-53mm=T3c. There is relatively diffuse involvement of the muscularis propria, including on image 15/5. The most well-defined extension through the muscularis propria is at the 10 o'clock position, measuring approximately 8 mm. Shortest Distance of any tumor/node from Mesorectal Fascia: 1-2 mm on image 15/5. Extramural Vascular Invasion/Tumor Thrombus: No Invasion of Anterior Peritoneal Reflection: Tumor is immediately adjacent to the anterior peritoneal reflection, without gross invasion. Example image 21/2. Involvement of Adjacent Organs or Pelvic Sidewall: No Levator Ani Involvement: No N - CATEGORY Mesorectal Lymph Nodes >=76mm: Nodes within the perirectal fat including at 6 mm on image 11/4. A low sigmoid mesocolon node measures 6 mm on image 6/5. Extra-mesorectal Lymphadenopathy: 6 mm node within the low sigmoid  mesocolon. Other: No significant free fluid. Urinary bladder entering a right inguinal hernia, as on CT. IMPRESSION: Rectal adenocarcinoma T stage: T3 Rectal adenocarcinoma N stage:  N2 Distance from tumor to the internal anal sphincter is 6.2 cm. Electronically Signed   By: Abigail Miyamoto M.D.   On: 08/04/2018 09:04   Ct Abdomen Pelvis W Contrast  Result Date: 07/24/2018 CLINICAL DATA:  Colorectal mass on endoscopy. EXAM: CT ABDOMEN AND PELVIS WITH CONTRAST TECHNIQUE: Multidetector CT imaging of the abdomen and pelvis was performed using the standard protocol following bolus administration of intravenous contrast. CONTRAST:  128mL OMNIPAQUE IOHEXOL 300 MG/ML  SOLN COMPARISON:  None. FINDINGS: Lower chest: Lung bases are clear. Hepatobiliary: No focal hepatic lesion. No biliary duct dilatation. Gallbladder is normal. Common bile duct is normal. Pancreas: Pancreas is normal. No ductal dilatation. No pancreatic inflammation. Spleen: Normal spleen Adrenals/urinary tract: Adrenal glands are normal. Simple fluid attenuation cystic lesion in the mid cortex RIGHT kidney. No hydronephrosis. The RIGHT aspect of the bladder extends into a RIGHT inguinal hernia. The bladder extends approximately 5-6 cm into the hernia sac (image 42/6). Stomach/Bowel: Stomach, duodenum small-bowel normal. Is appendix normal. There multiple diverticula of the ascending and transverse colon. Multiple diverticular of the descending colon. At the junction of the descending colon and proximal sigmoid colon there is rounded mass measuring 2.4 cm (image 42/2). This mass is contained within lumen of the colon. No colon wall thickening. At the junction of the sigmoid colon and rectum rectum there is a more irregular mass which is thick-walled and eccentric and enhancing and extending over approximately 6.5 cm of the proximal rectum (image 52/6). Several small iliac subcentimeter nodes are present. Several small subcentimeter nodes in the presacral fat  (image 57/2 image 53/2). Vascular/Lymphatic: Abdominal aorta normal caliber. Mild intimal calcification. No significant adenopathy other than the above described small iliac nodes and presacral nodes Reproductive: Post prostatectomy Other: No peritoneal metastasis Musculoskeletal: No aggressive osseous lesion. IMPRESSION: 1. Irregular asymmetric mural thickening of the proximal rectum over approximately 6-7 cm consistent with colorectal adenocarcinoma. 2. Potential local nodal metastasis with several subcentimeter iliac lymph nodes and presacral lymph nodes. 3. Rounded lesion in the proximal sigmoid colon corresponds to second lesion described on colonoscopy. 4. Extensive diverticular disease throughout the colon. 5. No evidence of metastatic  disease outside of the pelvis. 6. A relatively long segment of the bladder (6 cm) extends into a RIGHT inguinal hernia sac. Electronically Signed   By: Suzy Bouchard M.D.   On: 07/24/2018 15:54   Dg Chest Port 1 View  Result Date: 08/13/2018 CLINICAL DATA:  Porta catheter placement EXAM: PORTABLE CHEST 1 VIEW COMPARISON:  10/31/2017 FINDINGS: Right IJ porta catheter with tip at the upper SVC. Normal heart size and mediastinal contours. There is no edema, consolidation, effusion, or pneumothorax. IMPRESSION: Porta catheter placement without acute finding. Electronically Signed   By: Monte Fantasia M.D.   On: 08/13/2018 10:05   Dg C-arm 1-60 Min-no Report  Result Date: 08/13/2018 Fluoroscopy was utilized by the requesting physician.  No radiographic interpretation.     Assessment and plan- Patient is a 78 y.o. male with stage B rectal adenocarcinoma cT3 cN3 cM0.  He is here for on treatment assessment prior to cycle 1 of FOLFOX chemotherapy  Counts okay to proceed with cycle 1 of neoadjuvant FOLFOX chemotherapy today.  I will see him back in 2 weeks time with CBC, CMP for cycle 2 of FOLFOX chemotherapy.  He will come on day 3 for pump disconnect.  Again discussed  risks and benefits of chemotherapy including all but not limited to nausea, vomiting, fatigue, low blood counts, diarrhea and risk of peripheral neuropathy associated with oxaliplatin.  Patient understands and agrees to proceed as planned  Iron deficiency anemia: He will get first dose of Feraheme today and second dose in 2 weeks time  Neoplasm related pain: Likely secondary to rectal mass which causes discomfort when he sits.  I have asked the patient not to take ibuprofen and I will prescribe him oxycodone 5 mg every 6 as needed for pain.  Also recommended trying laxatives like MiraLAX and senna for opioid-induced constipation  He has an appointment coming up with [facility] colorectal surgery on 08/20/2018.   Visit Diagnosis 1. 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[place])   2. Encounter for antineoplastic chemotherapy   3. Iron deficiency anemia, unspecified iron deficiency anemia type   4. Neoplasm related pain      Dr. Randa Evens, MD, MPH [facility] at [facility] [phone] 08/17/2018 10:11 AM

## 2018-08-17 NOTE — Progress Notes (Signed)
Pt tolerated infusion well.  Blood return noted before, during and after Adrucil push.  Pt stable at discharge.

## 2018-08-17 NOTE — Patient Instructions (Signed)
Start taking senna 2 pill at night, then if that does not work then continue taking the senna and then once a day use 1 dose miralax.  If you develop diarrhea then hold medications until diarrhea stops and then resume meds as directed

## 2018-08-19 ENCOUNTER — Other Ambulatory Visit: Payer: Self-pay

## 2018-08-19 ENCOUNTER — Inpatient Hospital Stay: Payer: Medicare Other

## 2018-08-19 DIAGNOSIS — C20 Malignant neoplasm of rectum: Secondary | ICD-10-CM

## 2018-08-19 DIAGNOSIS — Z5111 Encounter for antineoplastic chemotherapy: Secondary | ICD-10-CM | POA: Diagnosis not present

## 2018-08-19 MED ORDER — SODIUM CHLORIDE 0.9% FLUSH
10.0000 mL | INTRAVENOUS | Status: DC | PRN
  Administered 2018-08-19: 10 mL
  Filled 2018-08-19: qty 10

## 2018-08-19 MED ORDER — HEPARIN SOD (PORK) LOCK FLUSH 100 UNIT/ML IV SOLN
500.0000 [IU] | Freq: Once | INTRAVENOUS | Status: AC | PRN
  Administered 2018-08-19: 500 [IU]

## 2018-08-19 MED ORDER — HEPARIN SOD (PORK) LOCK FLUSH 100 UNIT/ML IV SOLN
INTRAVENOUS | Status: AC
  Filled 2018-08-19: qty 5

## 2018-08-24 ENCOUNTER — Telehealth: Payer: Self-pay | Admitting: *Deleted

## 2018-08-24 MED ORDER — NYSTATIN 100000 UNIT/ML MT SUSP: 5.0000 mL | Freq: Four times a day (QID) | OROMUCOSAL | 1 refills | Status: DC

## 2018-08-24 NOTE — Telephone Encounter (Signed)
Patient informed of doctor response and prescription sent to pharmacy

## 2018-08-24 NOTE — Telephone Encounter (Signed)
Please send prescription for nystatin swish and spit. Patient should call us if symptoms dont improve in 2-3 days time. Thanks, Astrid Divine

## 2018-08-24 NOTE — Telephone Encounter (Signed)
Patient called reporting that he has white spots in his mouth and that his appetite is decreased. Please advise

## 2018-08-29 ENCOUNTER — Other Ambulatory Visit: Payer: Self-pay | Admitting: *Deleted

## 2018-08-29 DIAGNOSIS — C20 Malignant neoplasm of rectum: Secondary | ICD-10-CM

## 2018-08-31 ENCOUNTER — Other Ambulatory Visit: Payer: Self-pay

## 2018-08-31 ENCOUNTER — Inpatient Hospital Stay (HOSPITAL_BASED_OUTPATIENT_CLINIC_OR_DEPARTMENT_OTHER): Payer: Medicare Other | Admitting: Nurse Practitioner

## 2018-08-31 ENCOUNTER — Encounter: Payer: Self-pay | Admitting: Nurse Practitioner

## 2018-08-31 ENCOUNTER — Inpatient Hospital Stay: Payer: Medicare Other

## 2018-08-31 ENCOUNTER — Inpatient Hospital Stay: Payer: Medicare Other | Admitting: Hospice and Palliative Medicine

## 2018-08-31 ENCOUNTER — Encounter: Payer: Self-pay | Admitting: Oncology

## 2018-08-31 ENCOUNTER — Inpatient Hospital Stay (HOSPITAL_BASED_OUTPATIENT_CLINIC_OR_DEPARTMENT_OTHER): Payer: Medicare Other | Admitting: Oncology

## 2018-08-31 VITALS — BP 109/69 | HR 82 | Temp 95.0°F | Ht 63.0 in | Wt 159.3 lb

## 2018-08-31 DIAGNOSIS — D701 Agranulocytosis secondary to cancer chemotherapy: Secondary | ICD-10-CM

## 2018-08-31 DIAGNOSIS — Z794 Long term (current) use of insulin: Secondary | ICD-10-CM

## 2018-08-31 DIAGNOSIS — C20 Malignant neoplasm of rectum: Secondary | ICD-10-CM | POA: Diagnosis not present

## 2018-08-31 DIAGNOSIS — I10 Essential (primary) hypertension: Secondary | ICD-10-CM

## 2018-08-31 DIAGNOSIS — D509 Iron deficiency anemia, unspecified: Secondary | ICD-10-CM | POA: Diagnosis not present

## 2018-08-31 DIAGNOSIS — Z5111 Encounter for antineoplastic chemotherapy: Secondary | ICD-10-CM | POA: Diagnosis not present

## 2018-08-31 DIAGNOSIS — R7989 Other specified abnormal findings of blood chemistry: Secondary | ICD-10-CM

## 2018-08-31 DIAGNOSIS — N179 Acute kidney failure, unspecified: Secondary | ICD-10-CM

## 2018-08-31 DIAGNOSIS — G893 Neoplasm related pain (acute) (chronic): Secondary | ICD-10-CM

## 2018-08-31 DIAGNOSIS — E119 Type 2 diabetes mellitus without complications: Secondary | ICD-10-CM

## 2018-08-31 DIAGNOSIS — D500 Iron deficiency anemia secondary to blood loss (chronic): Secondary | ICD-10-CM

## 2018-08-31 DIAGNOSIS — Z79899 Other long term (current) drug therapy: Secondary | ICD-10-CM

## 2018-08-31 LAB — CBC WITH DIFFERENTIAL/PLATELET
Abs Immature Granulocytes: 0 K/uL (ref 0.00–0.07)
Basophils Absolute: 0 K/uL (ref 0.0–0.1)
Basophils Relative: 2 %
Eosinophils Absolute: 0 K/uL (ref 0.0–0.5)
Eosinophils Relative: 3 %
HCT: 29 % — ABNORMAL LOW (ref 39.0–52.0)
Hemoglobin: 9.7 g/dL — ABNORMAL LOW (ref 13.0–17.0)
Immature Granulocytes: 0 %
Lymphocytes Relative: 65 %
Lymphs Abs: 0.8 K/uL (ref 0.7–4.0)
MCH: 27.7 pg (ref 26.0–34.0)
MCHC: 33.4 g/dL (ref 30.0–36.0)
MCV: 82.9 fL (ref 80.0–100.0)
Monocytes Absolute: 0.2 K/uL (ref 0.1–1.0)
Monocytes Relative: 15 %
Neutro Abs: 0.2 K/uL — ABNORMAL LOW (ref 1.7–7.7)
Neutrophils Relative %: 15 %
Platelets: 157 K/uL (ref 150–400)
RBC: 3.5 MIL/uL — ABNORMAL LOW (ref 4.22–5.81)
RDW: 14.5 % (ref 11.5–15.5)
WBC: 1.2 K/uL — CL (ref 4.0–10.5)
nRBC: 0 % (ref 0.0–0.2)

## 2018-08-31 LAB — COMPREHENSIVE METABOLIC PANEL
ALT: 21 U/L (ref 0–44)
AST: 32 U/L (ref 15–41)
Albumin: 3.6 g/dL (ref 3.5–5.0)
Alkaline Phosphatase: 60 U/L (ref 38–126)
Anion gap: 10 (ref 5–15)
BUN: 21 mg/dL (ref 8–23)
CO2: 22 mmol/L (ref 22–32)
Calcium: 8.7 mg/dL — ABNORMAL LOW (ref 8.9–10.3)
Chloride: 103 mmol/L (ref 98–111)
Creatinine, Ser: 1.48 mg/dL — ABNORMAL HIGH (ref 0.61–1.24)
GFR calc Af Amer: 51 mL/min — ABNORMAL LOW (ref >60)
GFR calc non Af Amer: 44 mL/min — ABNORMAL LOW (ref >60)
Glucose, Bld: 108 mg/dL — ABNORMAL HIGH (ref 70–99)
Potassium: 4.1 mmol/L (ref 3.5–5.1)
Sodium: 135 mmol/L (ref 135–145)
Total Bilirubin: 0.5 mg/dL (ref 0.3–1.2)
Total Protein: 6.7 g/dL (ref 6.5–8.1)

## 2018-08-31 MED ORDER — SODIUM CHLORIDE 0.9 % IV SOLN
Freq: Once | INTRAVENOUS | Status: AC
  Administered 2018-08-31: 10:00:00 via INTRAVENOUS
  Filled 2018-08-31: qty 250

## 2018-08-31 MED ORDER — SODIUM CHLORIDE 0.9 % IV SOLN
510.0000 mg | Freq: Once | INTRAVENOUS | Status: AC
  Administered 2018-08-31: 510 mg via INTRAVENOUS
  Filled 2018-08-31: qty 17

## 2018-08-31 MED ORDER — SODIUM CHLORIDE 0.9% FLUSH
10.0000 mL | Freq: Once | INTRAVENOUS | Status: AC
  Administered 2018-08-31: 10 mL via INTRAVENOUS
  Filled 2018-08-31: qty 10

## 2018-08-31 MED ORDER — SODIUM CHLORIDE 0.9 % IV SOLN
Freq: Once | INTRAVENOUS | Status: AC
  Administered 2018-08-31: 11:00:00 via INTRAVENOUS
  Filled 2018-08-31: qty 250

## 2018-08-31 MED ORDER — HEPARIN SOD (PORK) LOCK FLUSH 100 UNIT/ML IV SOLN
500.0000 [IU] | Freq: Once | INTRAVENOUS | Status: AC | PRN
  Administered 2018-08-31: 500 [IU]
  Filled 2018-08-31: qty 5

## 2018-08-31 NOTE — Progress Notes (Signed)
[place]  [phone] [phone]  Patient Care Team: [name], MD as PCP - General (Internal Medicine) [name], RN as Registered Nurse   Name of the patient: Willie [name]  [id number]  03/27/1940   Date of visit: 08/31/18  Diagnosis-stage IIIb rectal adenocarcinoma  Chief complaint/Reason for visit- Initial Meeting for [place] starting chemotherapy  Heme/Onc history:  Oncology History  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[place])  08/04/2018 Cancer Staging   Staging form: Colon and Rectum, AJCC 8th Edition - Clinical stage from 08/04/2018: Stage IIIB (cT3, cN2a, cM0) - Signed by Sindy Guadeloupe, MD on 08/05/2018   08/05/2018 Initial Diagnosis   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[place])   08/17/2018 -  Chemotherapy   The patient had palonosetron (ALOXI) injection 0.25 mg, 0.25 mg, Intravenous,  Once, 1 of 8 cycles Administration: 0.25 mg (08/17/2018) pegfilgrastim-cbqv (UDENYCA) injection 6 mg, 6 mg, Subcutaneous, Once, 0 of 7 cycles leucovorin 750 mg in dextrose 5 % 250 mL infusion, 395 mg/m2 = 760 mg, Intravenous,  Once, 1 of 8 cycles Administration: 750 mg (08/17/2018) oxaliplatin (ELOXATIN) 150 mg in dextrose 5 % 500 mL chemo infusion, 79 mg/m2 = 160 mg, Intravenous,  Once, 1 of 8 cycles Dose modification: 65 mg/m2 (original dose 85 mg/m2, Cycle 2, Reason: Patient Age) Administration: 150 mg (08/17/2018) fluorouracil (ADRUCIL) chemo injection 750 mg, 400 mg/m2 = 750 mg, Intravenous,  Once, 1 of 8 cycles Administration: 750 mg (08/17/2018) fluorouracil (ADRUCIL) 4,550 mg in sodium chloride 0.9 % 59 mL chemo infusion, 2,400 mg/m2 = 4,550 mg, Intravenous, 1 Day/Dose, 1 of 8 cycles Administration: 4,550 mg (08/17/2018)  for chemotherapy treatment.      Interval history-  Willie [name], 78 year old male, with above history of rectal cancer and history of anemia and diabetes, presents to chemo care clinic today for initial meeting  in preparation for starting chemotherapy. I introduced the chemo care clinic and we discussed that the role of the clinic is to assist those who are at an increased risk of emergency room visits and/or complications during the course of chemotherapy treatment. We discussed that the increased risk takes into account factors such as age, performance status, and co-morbidities. We also discussed that for some, this might include barriers to care such as not having a primary care provider, lack of insurance/transportation, or not being able to afford medications. We discussed that the goal of the program is to help prevent unplanned ER visits and help reduce complications during chemotherapy. We do this by discussing specific risk factors to each individual and identifying ways that we can help improve these risk factors and reduce barriers to care.   ECOG FS:0 - Asymptomatic  Review of systems- Review of Systems  Constitutional: Negative for chills, fever, malaise/fatigue and weight loss.  HENT: Negative for congestion, ear discharge, ear pain, sinus pain, sore throat and tinnitus.   Eyes: Negative.   Respiratory: Negative.  Negative for cough, sputum production and shortness of breath.   Cardiovascular: Negative for chest pain, palpitations, orthopnea, claudication and leg swelling.  Gastrointestinal: Negative for abdominal pain, blood in stool, constipation, diarrhea, heartburn, nausea and vomiting.  Genitourinary: Negative.   Musculoskeletal: Negative.   Skin: Negative.   Neurological: Negative for dizziness, tingling, weakness and headaches.  Endo/Heme/Allergies: Negative.   Psychiatric/Behavioral: Negative.     Current treatment-FOLFOX   No Known Allergies  Past Medical History:  Diagnosis Date  . Anemia   . Cancer (  [place])   . Coronary artery disease   . Diabetes mellitus without complication ([place])   . History of hiatal hernia   . Hypercholesterolemia   . Hypertension     Past  Surgical History:  Procedure Laterality Date  . COLONOSCOPY WITH PROPOFOL N/A 07/15/2018   Procedure: COLONOSCOPY WITH PROPOFOL;  Surgeon: [name], [name], MD;  Location: [facility] ENDOSCOPY;  Service: Gastroenterology;  Laterality: N/A;  . ESOPHAGOGASTRODUODENOSCOPY (EGD) WITH PROPOFOL N/A 07/15/2018   Procedure: ESOPHAGOGASTRODUODENOSCOPY (EGD) WITH PROPOFOL;  Surgeon: [name], [name], MD;  Location: [facility] ENDOSCOPY;  Service: Gastroenterology;  Laterality: N/A;  . PORTACATH PLACEMENT N/A 08/13/2018   Procedure: INSERTION PORT-A-CATH;  Surgeon: [name], MD;  Location: [facility] ORS;  Service: General;  Laterality: N/A;  . PROSTATE SURGERY      Social History   Socioeconomic History  . Marital status: Widowed    Spouse name: Not on file  . Number of children: 0  . Years of education: Not on file  . Highest education level: Not on file  Occupational History  . Occupation: Pharmacist, hospital    Comment: Retired  Scientific laboratory technician  . Financial resource strain: Not hard at all  . Food insecurity    Worry: Never true    Inability: Never true  . Transportation needs    Medical: No    Non-medical: No  Tobacco Use  . Smoking status: Never Smoker  . Smokeless tobacco: Never Used  Substance and Sexual Activity  . Alcohol use: Not Currently  . Drug use: Never  . Sexual activity: Not Currently  Lifestyle  . Physical activity    Days per week: 2 days    Minutes per session: 30 min  . Stress: Only a little  Relationships  . Social connections    Talks on phone: More than three times a week    Gets together: More than three times a week    Attends religious service: Not on file    Active member of club or organization: Not on file    Attends meetings of clubs or organizations: Not on file    Relationship status: Widowed  . Intimate partner violence    Fear of current or ex partner: No    Emotionally abused: No    Physically abused: No    Forced sexual activity: No  Other Topics Concern  .  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  His wife was a Camera operator and they traveled frequently.  Several nieces and nephews.  He is the youngest of his brothers and sisters and only surviving.    No family history on file.   Current Outpatient Medications:  .  dexamethasone (DECADRON) 4 MG tablet, Take 2 tablets (8 mg total) by mouth daily. Start the day after chemotherapy for 2 days. Take with food., Disp: 30 tablet, Rfl: 1 .  ezetimibe (ZETIA) 10 MG tablet, Take 10 mg by mouth every morning. , Disp: , Rfl:  .  ferrous sulfate 325 (65 FE) MG tablet, Take 325 mg by mouth daily., Disp: , Rfl:  .  HUMALOG MIX 75/25 KWIKPEN (75-25) 100 UNIT/ML Kwikpen, Inject 30 Units into the skin every morning. , Disp: , Rfl:  .  lidocaine-prilocaine (EMLA) cream, Apply to affected area once, Disp: 30 g, Rfl: 3 .  lisinopril (PRINIVIL,ZESTRIL) 5 MG tablet, Take 5 mg by mouth every  morning. , Disp: , Rfl:  .  metoprolol succinate (TOPROL-XL) 50 MG 24 hr tablet, Take 50 mg by mouth every morning. , Disp: , Rfl:  .  niacin (NIASPAN) 500 MG CR tablet, Take 500 mg by mouth every evening. , Disp: , Rfl:  .  nystatin (MYCOSTATIN) 100000 UNIT/ML suspension, Take 5 mLs (500,000 Units total) by mouth 4 (four) times daily., Disp: 120 mL, Rfl: 1 .  ondansetron (ZOFRAN) 8 MG tablet, Take 1 tablet (8 mg total) by mouth 2 (two) times daily as needed for refractory nausea / vomiting. Start on day 3 after chemotherapy., Disp: 30 tablet, Rfl: 1 .  oxyCODONE (OXY IR/ROXICODONE) 5 MG immediate release tablet, Take 1 tablet (5 mg total) by mouth every 6 (six) hours as needed for severe pain., Disp: 60 tablet, Rfl: 0 .  pantoprazole (PROTONIX) 40 MG tablet, Take 40 mg by mouth every morning. , Disp: , Rfl:  .  prochlorperazine (COMPAZINE) 10 MG tablet, Take 1 tablet (10 mg total) by mouth every  6 (six) hours as needed (Nausea or vomiting)., Disp: 30 tablet, Rfl: 1 .  traZODone (DESYREL) 50 MG tablet, Take 50 mg by mouth at bedtime as needed for sleep. , Disp: , Rfl:  No current facility-administered medications for this visit.   Facility-Administered Medications Ordered in Other Visits:  .  0.9 %  sodium chloride infusion, , Intravenous, Once, Sindy Guadeloupe, MD, Last Rate: 999 mL/hr at 08/31/18 1039 .  heparin lock flush 100 unit/mL, 500 Units, Intracatheter, Once PRN, Sindy Guadeloupe, MD  Physical exam: There were no vitals filed for this visit. Physical Exam Constitutional:      General: He is not in acute distress.    Comments: Frail appearing. Thin  HENT:     Head: Normocephalic and atraumatic.     Nose: No rhinorrhea.     Mouth/Throat:     Mouth: Mucous membranes are moist.     Pharynx: Oropharynx is clear.  Eyes:     General: No scleral icterus.    Conjunctiva/sclera: Conjunctivae normal.  Cardiovascular:     Rate and Rhythm: Normal rate and regular rhythm.  Pulmonary:     Effort: Pulmonary effort is normal.     Breath sounds: Normal breath sounds.  Abdominal:     Palpations: Abdomen is soft.     Tenderness: There is no abdominal tenderness.  Musculoskeletal:        General: No signs of injury.     Comments: No aids  Skin:    General: Skin is warm and dry.  Neurological:     Mental Status: He is alert and oriented to person, place, and time.     Motor: No weakness.  Psychiatric:        Mood and Affect: Mood normal.        Behavior: Behavior normal.      CMP Latest Ref Rng & Units 08/31/2018  Glucose 70 - 99 mg/dL 108(H)  BUN 8 - 23 mg/dL 21  Creatinine 0.61 - 1.24 mg/dL 1.48(H)  Sodium 135 - 145 mmol/L 135  Potassium 3.5 - 5.1 mmol/L 4.1  Chloride 98 - 111 mmol/L 103  CO2 22 - 32 mmol/L 22  Calcium 8.9 - 10.3 mg/dL 8.7(L)  Total Protein 6.5 - 8.1 g/dL 6.7  Total Bilirubin 0.3 - 1.2 mg/dL 0.5  Alkaline Phos 38 - 126 U/L 60  AST 15 - 41 U/L 32   ALT 0 - 44 U/L 21   CBC  Latest Ref Rng & Units 08/31/2018  WBC 4.0 - 10.5 K/uL 1.2(LL)  Hemoglobin 13.0 - 17.0 g/dL 9.7(L)  Hematocrit 39.0 - 52.0 % 29.0(L)  Platelets 150 - 400 K/uL 157    No images are attached to the encounter.  Ct Chest W Contrast  Result Date: 08/07/2018 CLINICAL DATA:  Preoperative evaluation for insertion of Port-A-Cath. Rectal cancer. EXAM: CT CHEST WITH CONTRAST TECHNIQUE: Multidetector CT imaging of the chest was performed during intravenous contrast administration. CONTRAST:  3mL OMNIPAQUE IOHEXOL 300 MG/ML  SOLN COMPARISON:  Chest x-ray 10/31/2017. FINDINGS: Cardiovascular: Scattered descending aortic calcifications. Moderate coronary artery calcifications in the left anterior descending and left circumflex coronary arteries. Heart is normal size. Aorta is normal caliber. Mediastinum/Nodes: No mediastinal, hilar, or axillary adenopathy. Trachea and esophagus are unremarkable. Thyroid unremarkable. Lungs/Pleura: Lungs are clear. No focal airspace opacities or suspicious nodules. No effusions. Upper Abdomen: Imaging into the upper abdomen shows no acute findings. 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 Musculoskeletal: Chest wall soft tissues are unremarkable. No acute bony abnormality. IMPRESSION: No acute cardiopulmonary disease. Coronary artery disease. Aortic Atherosclerosis (ICD10-I70.0). Electronically Signed   By: Rolm Baptise M.D.   On: 08/07/2018 16:37   Dg Chest Port 1 View  Result Date: 08/13/2018 CLINICAL DATA:  Porta catheter placement EXAM: PORTABLE CHEST 1 VIEW COMPARISON:  10/31/2017 FINDINGS: Right IJ porta catheter with tip at the upper SVC. Normal heart size and mediastinal contours. There is no edema, consolidation, effusion, or pneumothorax. IMPRESSION: Porta catheter placement without acute finding. Electronically Signed   By: Monte Fantasia M.D.   On: 08/13/2018 10:05   Dg C-arm 1-60 Min-no Report  Result Date: 08/13/2018  Fluoroscopy was utilized by the requesting physician.  No radiographic interpretation.     Assessment and plan- Patient is a 78 y.o. male who presents to [facility] for initial meeting in preparation for starting chemotherapy for the treatment of rectal adenocarcinoma.   1. Rectal Adenocarcinoma- cT3 cN3 cM0 stage III B, currently receiving cycle 2 of neoadjuvant FOLFOX chemotherapy. ANC was low today therefore chemotherapy delayed today.  He will be receiving Neulasta with upcoming treatments.  2. Chemo Care Clinic/High Risk for ER/Hospitalization during chemotherapy- We discussed the role of the chemo care clinic and identified patient specific risk factors. I discussed that patient was identified as high risk primarily based on: recent hospitalizations and ER visits, medicare status, anemia, DM, and living alone. He has a current PCP and we discussed options for care including Symptom Management Clinic.   - Anemia- Iron deficiency which led to colonoscopy which led to diagnosis of cancer. Getting feraheme today and taking oral iron as well. Managed and followed by Dr. Janese Banks.   - Diabetes Mellitus- we discussed that treatments and medications including steroids can make blood sugars elevated and some patients will need adjustments in their diabetes medications. He reports stable, well controlled sugars today, therefore he can follow up with his PCP as scheduled. If elevated sugars, would consider sliding scale or options for meal coverage. Will also refer to dietician for discussion of managing weight loss and blood sugars while on chemotherapy.   3. Social Determinants of Health- we discussed that social determinants of health may have significant impacts on health and outcomes for cancer patients.  Today we discussed specific social determinants of performance status, alcohol use, depression, financial needs, food insecurity, housing, interpersonal violence, social connections, stress,  tobacco use, and transportation.  After lengthy discussion the following were identified as  areas of need: low risk of social connections (based on living alone and recent loss of his wife) & transportation need based on him living alone and no immediate family nearby.  He does say he is well supported by his neighbors and nieces and nephews.  He does denies specific needs today but said he could see needing them in the future. We discussed options for socialization including rerral for counseling services or support groups which he declines today but will consider in the future.  We discussed options for transportation to appointments if his family is not able to provide them including [employer], paratransit, bus routes, link transit, taxi/uber/lyft and cancer center van. He can request referral for these services through primary oncology team if needed in the future.   4. Palliative Care- based on stage of cancer and/or identified needs today, I will refer patient to palliative care for goals of care and advanced care planning. He believes that he has completed ACP documents and I've asked him to bring a copy of those to his [facility] appointment for discussion and review.   5.  Weight loss-BMI 28.  Weight overall down recently.  He questions food selection specifically as it applies to neutropenia.  We discussed interventions for minimizing weight loss and stimulating appetite.  We will also refer to dietitian. Encouraged fluid intake in setting of elevated serum creatinine today.   We also discussed the role of the Symptom Management Clinic at [facility] for acute issues and methods of contacting clinic/provider. He denies needing specific assistance at this time and thanks for visit and information.  Follow-up with Dr. Janese Banks as scheduled.  Visit Diagnosis 1. Rectal cancer [facility])    Patient expressed understanding and was in agreement with this plan. He also understands that He can call clinic at any time with any  questions, concerns, or complaints.   A total of (45) minutes of face-to-face time was spent with this patient with greater than 50% of that time in counseling and care-coordination.  Beckey Rutter, DNP, AGNP-C [place] at [facility] [phone] (work cell) [phone] (office)  CC: Dr. Janese Banks, Dr. Edwina Barth, Jennet Maduro, [place]

## 2018-08-31 NOTE — Addendum Note (Signed)
Addended by: Luella Cook on: 08/31/2018 10:01 AM   Modules accepted: Orders

## 2018-08-31 NOTE — Progress Notes (Signed)
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 He was recently found to have iron deficiency anemia which led to a colonoscopy On 07/15/2018. Colonoscopy showed a villous partially obstructing medium-sized mass in the mid sigmoid colon. The mass was partially circumferential measuring 5 cm. There was another infiltrative partially obstructing medium-sized mass found in the rectosigmoid colon which also measured 5 cm. Pathology from the sigmoid colon mass showed high-grade dysplasia at least involving an adenomatous lesion with villous features. A more serious process is not excluded. Rectosigmoid mass biopsy showed invasive colorectal adenocarcinoma.  Patient was referred to Dr. [name] for surgical management. Patient had a CT abdomen and pelvis with contrast which showed irregular asymmetric mural thickening of the proximal rectum measuring over 6 to 7 cm. Potential local nodal metastases with several subcentimeter iliac lymph nodes and presacral lymph nodes. No evidence of metastatic disease outside the pelvis.  Patient also had MRI of the pelvis with and without contrast which showed tumor length 8.5 cm with extension through muscularis propria decreased C. There is diffuse involvement of muscularis propria. No extramural vascular invasion or tumor thrombus. Shortest distance of any  tumor/note from mesorectal fascia 1 to 2 mm. No involvement of adjacent organs or pelvic sidewall. Mesorectal lymph nodes within perirectal fat including 6 mm node and a low sigmoid mesocolon node measuring 6 mm. Another 6 mm node within the low sigmoid mesocolon representing extremity rectal lymphadenopathy. T3N2 by MRI. Distance from tumor to internal anal sphincter is 6.2 cm.  Plan is to proceed with total neoadjuvant chemotherapy followed by definitive surgery.  Interval history-he reports soreness around his rectum which he has had since diagnosis.  Oxycodone has been helping.  He also had mild nausea with first treatment which resolved with nausea medications.  Also reported some thrush with cycle 1 which resolved with nystatin  ECOG PS- 1 Pain scale- 4 Opioid associated constipation- no  Review of systems- Review of Systems  Constitutional: Negative for chills, fever, malaise/fatigue and weight loss.  HENT: Negative for congestion, ear discharge and nosebleeds.   Eyes: Negative for blurred vision.  Respiratory: Negative for cough, hemoptysis, sputum production, shortness of breath and wheezing.   Cardiovascular: Negative for chest pain, palpitations, orthopnea and claudication.  Gastrointestinal: Negative for abdominal pain, blood in stool, constipation, diarrhea, heartburn, melena, nausea and vomiting.  Genitourinary: Negative for dysuria, flank pain, frequency, hematuria and urgency.  Musculoskeletal: Negative for back pain, joint pain and myalgias.  Skin: Negative for rash.  Neurological: Negative for dizziness, tingling, focal weakness, seizures, weakness and headaches.  Endo/Heme/Allergies: Does not bruise/bleed easily.  Psychiatric/Behavioral: Negative for depression and suicidal ideas. The patient does not have insomnia.       No Known Allergies   Past Medical History:  Diagnosis Date  . Anemia   . Cancer ([place])   . Coronary artery disease   . Diabetes mellitus  without complication ([place])   . History of hiatal hernia   .  Hypercholesterolemia   . Hypertension      Past Surgical History:  Procedure Laterality Date  . COLONOSCOPY WITH PROPOFOL N/A 07/15/2018   Procedure: COLONOSCOPY WITH PROPOFOL;  Surgeon: [name], [name], MD;  Location: [facility] ENDOSCOPY;  Service: Gastroenterology;  Laterality: N/A;  . ESOPHAGOGASTRODUODENOSCOPY (EGD) WITH PROPOFOL N/A 07/15/2018   Procedure: ESOPHAGOGASTRODUODENOSCOPY (EGD) WITH PROPOFOL;  Surgeon: [name], [name], MD;  Location: [facility] ENDOSCOPY;  Service: Gastroenterology;  Laterality: N/A;  . PORTACATH PLACEMENT N/A 08/13/2018   Procedure: INSERTION PORT-A-CATH;  Surgeon: [name], MD;  Location: [facility] ORS;  Service: General;  Laterality: N/A;  . PROSTATE SURGERY      Social History   Socioeconomic History  . Marital status: Married    Spouse name: Not on file  . Number of children: Not on file  . Years of education: Not on file  . Highest education level: Not on file  Occupational History  . Not on file  Social Needs  . Financial resource strain: Not hard at all  . Food insecurity    Worry: Never true    Inability: Never true  . Transportation needs    Medical: No    Non-medical: No  Tobacco Use  . Smoking status: Never Smoker  . Smokeless tobacco: Never Used  Substance and Sexual Activity  . Alcohol use: Not Currently  . Drug use: Never  . Sexual activity: Not on file  Lifestyle  . Physical activity    Days per week: 2 days    Minutes per session: 30 min  . Stress: Only a little  Relationships  . Social Herbalist on phone: Not on file    Gets together: Not on file    Attends religious service: Not on file    Active member of club or organization: Not on file    Attends meetings of clubs or organizations: Not on file    Relationship status: Widowed  . Intimate partner violence    Fear of current or ex partner: Patient refused    Emotionally abused: Patient  refused    Physically abused: Patient refused    Forced sexual activity: Patient refused  Other Topics Concern  . Not on file  Social History Narrative  . Not on file    No family history on file.   Current Outpatient Medications:  .  dexamethasone (DECADRON) 4 MG tablet, Take 2 tablets (8 mg total) by mouth daily. Start the day after chemotherapy for 2 days. Take with food., Disp: 30 tablet, Rfl: 1 .  ezetimibe (ZETIA) 10 MG tablet, Take 10 mg by mouth every morning. , Disp: , Rfl:  .  ferrous sulfate 325 (65 FE) MG tablet, Take 325 mg by mouth daily., Disp: , Rfl:  .  HUMALOG MIX 75/25 KWIKPEN (75-25) 100 UNIT/ML Kwikpen, Inject 30 Units into the skin every morning. , Disp: , Rfl:  .  lidocaine-prilocaine (EMLA) cream, Apply to affected area once, Disp: 30 g, Rfl: 3 .  lisinopril (PRINIVIL,ZESTRIL) 5 MG tablet, Take 5 mg by mouth every morning. , Disp: , Rfl:  .  metoprolol succinate (TOPROL-XL) 50 MG 24 hr tablet, Take 50 mg by mouth every morning. , Disp: , Rfl:  .  niacin (NIASPAN) 500 MG CR tablet, Take 500 mg by mouth every evening. , Disp: , Rfl:  .  nystatin (MYCOSTATIN) 100000 UNIT/ML suspension, Take 5 mLs (500,000 Units total) by mouth 4 (four) times daily., Disp: 120 mL, Rfl: 1 .  ondansetron (  ZOFRAN) 8 MG tablet, Take 1 tablet (8 mg total) by mouth 2 (two) times daily as needed for refractory nausea / vomiting. Start on day 3 after chemotherapy., Disp: 30 tablet, Rfl: 1 .  oxyCODONE (OXY IR/ROXICODONE) 5 MG immediate release tablet, Take 1 tablet (5 mg total) by mouth every 6 (six) hours as needed for severe pain., Disp: 60 tablet, Rfl: 0 .  pantoprazole (PROTONIX) 40 MG tablet, Take 40 mg by mouth every morning. , Disp: , Rfl:  .  prochlorperazine (COMPAZINE) 10 MG tablet, Take 1 tablet (10 mg total) by mouth every 6 (six) hours as needed (Nausea or vomiting)., Disp: 30 tablet, Rfl: 1 .  traZODone (DESYREL) 50 MG tablet, Take 50 mg by mouth at bedtime as needed for sleep. ,  Disp: , Rfl:  No current facility-administered medications for this visit.   Facility-Administered Medications Ordered in Other Visits:  .  sodium chloride flush (NS) 0.9 % injection 10 mL, 10 mL, Intravenous, Once, Sindy Guadeloupe, MD  Physical exam:  Vitals:   08/31/18 0848  BP: 109/69  Pulse: 82  Temp: (!) 95 F (35 C)  TempSrc: Tympanic  Weight: 159 lb 4.8 oz (72.3 kg)  Height: 5\' 3"  (1.6 m)   Physical Exam HENT:     Head: Normocephalic and atraumatic.  Eyes:     Pupils: Pupils are equal, round, and reactive to light.  Neck:     Musculoskeletal: Normal range of motion.  Cardiovascular:     Rate and Rhythm: Normal rate and regular rhythm.     Heart sounds: Normal heart sounds.  Pulmonary:     Effort: Pulmonary effort is normal.     Breath sounds: Normal breath sounds.  Abdominal:     General: Bowel sounds are normal.     Palpations: Abdomen is soft.  Skin:    General: Skin is warm and dry.  Neurological:     Mental Status: He is alert and oriented to person, place, and time.      CMP Latest Ref Rng & Units 08/31/2018  Glucose 70 - 99 mg/dL 108(H)  BUN 8 - 23 mg/dL 21  Creatinine 0.61 - 1.24 mg/dL 1.48(H)  Sodium 135 - 145 mmol/L 135  Potassium 3.5 - 5.1 mmol/L 4.1  Chloride 98 - 111 mmol/L 103  CO2 22 - 32 mmol/L 22  Calcium 8.9 - 10.3 mg/dL 8.7(L)  Total Protein 6.5 - 8.1 g/dL 6.7  Total Bilirubin 0.3 - 1.2 mg/dL 0.5  Alkaline Phos 38 - 126 U/L 60  AST 15 - 41 U/L 32  ALT 0 - 44 U/L 21   CBC Latest Ref Rng & Units 08/31/2018  WBC 4.0 - 10.5 K/uL 1.2(LL)  Hemoglobin 13.0 - 17.0 g/dL 9.7(L)  Hematocrit 39.0 - 52.0 % 29.0(L)  Platelets 150 - 400 K/uL 157    No images are attached to the encounter.  Ct Chest W Contrast  Result Date: 08/07/2018 CLINICAL DATA:  Preoperative evaluation for insertion of Port-A-Cath. Rectal cancer. EXAM: CT CHEST WITH CONTRAST TECHNIQUE: Multidetector CT imaging of the chest was performed during intravenous contrast  administration. CONTRAST:  56mL OMNIPAQUE IOHEXOL 300 MG/ML  SOLN COMPARISON:  Chest x-ray 10/31/2017. FINDINGS: Cardiovascular: Scattered descending aortic calcifications. Moderate coronary artery calcifications in the left anterior descending and left circumflex coronary arteries. Heart is normal size. Aorta is normal caliber. Mediastinum/Nodes: No mediastinal, hilar, or axillary adenopathy. Trachea and esophagus are unremarkable. Thyroid unremarkable. Lungs/Pleura: Lungs are clear. No focal airspace opacities or suspicious  nodules. No effusions. Upper Abdomen: Imaging into the upper abdomen shows no acute findings. 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 Musculoskeletal: Chest wall soft tissues are unremarkable. No acute bony abnormality. IMPRESSION: No acute cardiopulmonary disease. Coronary artery disease. Aortic Atherosclerosis (ICD10-I70.0). Electronically Signed   By: Rolm Baptise M.D.   On: 08/07/2018 16:37   Mr Pelvis W OZ Contrast  Result Date: 08/04/2018 CLINICAL DATA:  Rectal cancer. Sigmoid and rectosigmoid junction masses. EXAM: MRI PELVIS WITHOUT AND WITH CONTRAST TECHNIQUE: Multiplanar multisequence MR imaging of the pelvis was performed both before and after administration of intravenous contrast. Small amount of [place] gel was administered per rectum to optimize tumor evaluation. CONTRAST:  7 cc of Gadavist COMPARISON:  CT 07/23/2018.  Colonoscopy report of 07/15/2018. FINDINGS: TUMOR LOCATION Tumor distance from Anal Verge/Skin Surface: 13.3 cm, including on image 20/2. Tumor distance from Internal Anal Sphincter: 6.2 cm, including on image 20/7. TUMOR DESCRIPTION Circumferential Extent: Circumferential, 360 degrees Tumor Length: 8.5 cm, including image 18/2. T - CATEGORY Extension through Muscularis Propria: Yes 6-13mm=T3c. There is relatively diffuse involvement of the muscularis propria, including on image 15/5. The most well-defined extension through the muscularis  propria is at the 10 o'clock position, measuring approximately 8 mm. Shortest Distance of any tumor/node from Mesorectal Fascia: 1-2 mm on image 15/5. Extramural Vascular Invasion/Tumor Thrombus: No Invasion of Anterior Peritoneal Reflection: Tumor is immediately adjacent to the anterior peritoneal reflection, without gross invasion. Example image 21/2. Involvement of Adjacent Organs or Pelvic Sidewall: No Levator Ani Involvement: No N - CATEGORY Mesorectal Lymph Nodes >=33mm: Nodes within the perirectal fat including at 6 mm on image 11/4. A low sigmoid mesocolon node measures 6 mm on image 6/5. Extra-mesorectal Lymphadenopathy: 6 mm node within the low sigmoid mesocolon. Other: No significant free fluid. Urinary bladder entering a right inguinal hernia, as on CT. IMPRESSION: Rectal adenocarcinoma T stage: T3 Rectal adenocarcinoma N stage:  N2 Distance from tumor to the internal anal sphincter is 6.2 cm. Electronically Signed   By: Abigail Miyamoto M.D.   On: 08/04/2018 09:04   Dg Chest Port 1 View  Result Date: 08/13/2018 CLINICAL DATA:  Porta catheter placement EXAM: PORTABLE CHEST 1 VIEW COMPARISON:  10/31/2017 FINDINGS: Right IJ porta catheter with tip at the upper SVC. Normal heart size and mediastinal contours. There is no edema, consolidation, effusion, or pneumothorax. IMPRESSION: Porta catheter placement without acute finding. Electronically Signed   By: Monte Fantasia M.D.   On: 08/13/2018 10:05   Dg C-arm 1-60 Min-no Report  Result Date: 08/13/2018 Fluoroscopy was utilized by the requesting physician.  No radiographic interpretation.     Assessment and plan- Patient is a 78 y.o. male with stage III B rectal adenocarcinoma cT3 cN3 cM0.  He is here for on treatment assessment prior to cycle 2 neoadjuvant FOLFOX chemotherapy  Patient's white count is 1.2 today with an ANC of 0.2.  He will therefore not be receiving chemotherapy today.  Chemo will be delayed by 1 week and I will plan to add Neulasta  with cycle 2 on day 3 of pump disconnect.  Iron deficiency anemia: He will get his second dose of Feraheme today  Neoplasm related rectal pain: Continue PRN oxycodone  AKI: Creatinine slightly elevated at 1.48 today.  We will give him 1 L of IV fluids today.  I have encouraged him to improve his fluid intake to 40 to 50 cc/day.  I will see him back in 3 weeks with CBC with differential,  CMP, CEA for cycle 3 of FOLFOX chemotherapy with Neulasta support   Visit Diagnosis 1. Chemotherapy induced neutropenia (H[facility])   2. 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[place])   3. Iron deficiency anemia, unspecified iron deficiency anemia type      Dr. Randa Evens, MD, MPH [facility] [phone] 08/31/2018 9:11 AM

## 2018-09-01 NOTE — Addendum Note (Signed)
Addended by: Sindy Guadeloupe on: 09/01/2018 08:21 AM   Modules accepted: Orders

## 2018-09-04 ENCOUNTER — Telehealth: Payer: Self-pay | Admitting: *Deleted

## 2018-09-04 NOTE — Telephone Encounter (Signed)
Patient answered No to all the COVID19 questions.

## 2018-09-07 ENCOUNTER — Other Ambulatory Visit: Payer: Self-pay | Admitting: Oncology

## 2018-09-07 ENCOUNTER — Other Ambulatory Visit: Payer: Self-pay

## 2018-09-07 ENCOUNTER — Inpatient Hospital Stay: Payer: Medicare Other

## 2018-09-07 VITALS — BP 121/72 | HR 68 | Temp 97.0°F | Resp 20 | Wt 161.0 lb

## 2018-09-07 DIAGNOSIS — C20 Malignant neoplasm of rectum: Secondary | ICD-10-CM

## 2018-09-07 DIAGNOSIS — Z95828 Presence of other vascular implants and grafts: Secondary | ICD-10-CM

## 2018-09-07 DIAGNOSIS — Z5111 Encounter for antineoplastic chemotherapy: Secondary | ICD-10-CM | POA: Diagnosis not present

## 2018-09-07 LAB — COMPREHENSIVE METABOLIC PANEL
ALT: 42 U/L (ref 0–44)
AST: 47 U/L — ABNORMAL HIGH (ref 15–41)
Albumin: 3 g/dL — ABNORMAL LOW (ref 3.5–5.0)
Alkaline Phosphatase: 61 U/L (ref 38–126)
Anion gap: 7 (ref 5–15)
BUN: 16 mg/dL (ref 8–23)
CO2: 24 mmol/L (ref 22–32)
Calcium: 8.7 mg/dL — ABNORMAL LOW (ref 8.9–10.3)
Chloride: 106 mmol/L (ref 98–111)
Creatinine, Ser: 1.08 mg/dL (ref 0.61–1.24)
GFR calc Af Amer: >60 mL/min (ref >60)
GFR calc non Af Amer: >60 mL/min (ref >60)
Glucose, Bld: 143 mg/dL — ABNORMAL HIGH (ref 70–99)
Potassium: 3.9 mmol/L (ref 3.5–5.1)
Sodium: 137 mmol/L (ref 135–145)
Total Bilirubin: 0.4 mg/dL (ref 0.3–1.2)
Total Protein: 6.3 g/dL — ABNORMAL LOW (ref 6.5–8.1)

## 2018-09-07 LAB — CBC WITH DIFFERENTIAL/PLATELET
Abs Immature Granulocytes: 0.96 10*3/uL — ABNORMAL HIGH (ref 0.00–0.07)
Basophils Absolute: 0.1 10*3/uL (ref 0.0–0.1)
Basophils Relative: 1 %
Eosinophils Absolute: 0.1 10*3/uL (ref 0.0–0.5)
Eosinophils Relative: 1 %
HCT: 28.8 % — ABNORMAL LOW (ref 39.0–52.0)
Hemoglobin: 9.5 g/dL — ABNORMAL LOW (ref 13.0–17.0)
Immature Granulocytes: 18 %
Lymphocytes Relative: 33 %
Lymphs Abs: 1.8 10*3/uL (ref 0.7–4.0)
MCH: 28 pg (ref 26.0–34.0)
MCHC: 33 g/dL (ref 30.0–36.0)
MCV: 85 fL (ref 80.0–100.0)
Monocytes Absolute: 0.6 10*3/uL (ref 0.1–1.0)
Monocytes Relative: 11 %
Neutro Abs: 2 10*3/uL (ref 1.7–7.7)
Neutrophils Relative %: 36 %
Platelets: 280 10*3/uL (ref 150–400)
RBC: 3.39 MIL/uL — ABNORMAL LOW (ref 4.22–5.81)
RDW: 16.7 % — ABNORMAL HIGH (ref 11.5–15.5)
Smear Review: NORMAL
WBC: 5.4 10*3/uL (ref 4.0–10.5)
nRBC: 0.7 % — ABNORMAL HIGH (ref 0.0–0.2)

## 2018-09-07 MED ORDER — PALONOSETRON HCL INJECTION 0.25 MG/5ML
0.2500 mg | Freq: Once | INTRAVENOUS | Status: AC
  Administered 2018-09-07: 0.25 mg via INTRAVENOUS
  Filled 2018-09-07: qty 5

## 2018-09-07 MED ORDER — SODIUM CHLORIDE 0.9% FLUSH
10.0000 mL | Freq: Once | INTRAVENOUS | Status: AC
  Administered 2018-09-07: 08:00:00 10 mL via INTRAVENOUS
  Filled 2018-09-07: qty 10

## 2018-09-07 MED ORDER — DEXAMETHASONE SODIUM PHOSPHATE 10 MG/ML IJ SOLN
10.0000 mg | Freq: Once | INTRAMUSCULAR | Status: AC
  Administered 2018-09-07: 10 mg via INTRAVENOUS
  Filled 2018-09-07: qty 1

## 2018-09-07 MED ORDER — OXALIPLATIN CHEMO INJECTION 100 MG/20ML
65.0000 mg/m2 | Freq: Once | INTRAVENOUS | Status: AC
  Administered 2018-09-07: 125 mg via INTRAVENOUS
  Filled 2018-09-07: qty 20

## 2018-09-07 MED ORDER — SODIUM CHLORIDE 0.9 % IV SOLN
2400.0000 mg/m2 | INTRAVENOUS | Status: DC
  Administered 2018-09-07: 4550 mg via INTRAVENOUS
  Filled 2018-09-07: qty 91

## 2018-09-07 MED ORDER — DEXTROSE 5 % IV SOLN
Freq: Once | INTRAVENOUS | Status: AC
  Administered 2018-09-07: 09:00:00 via INTRAVENOUS
  Filled 2018-09-07: qty 250

## 2018-09-07 MED ORDER — FLUOROURACIL CHEMO INJECTION 2.5 GM/50ML
400.0000 mg/m2 | Freq: Once | INTRAVENOUS | Status: AC
  Administered 2018-09-07: 750 mg via INTRAVENOUS
  Filled 2018-09-07: qty 15

## 2018-09-07 MED ORDER — LEUCOVORIN CALCIUM INJECTION 350 MG: 400.0000 mg/m2 | Freq: Once | INTRAVENOUS | Status: DC

## 2018-09-07 MED ORDER — LEUCOVORIN CALCIUM INJECTION 350 MG
395.0000 mg/m2 | Freq: Once | INTRAVENOUS | Status: AC
  Administered 2018-09-07: 10:00:00 750 mg via INTRAVENOUS
  Filled 2018-09-07: qty 17.5

## 2018-09-07 NOTE — Progress Notes (Signed)
Nutrition Assessment   Reason for Assessment:   Weight loss and blood sugar control   ASSESSMENT:  78 year old male with stage III rectal adenocarcinoma.  Past medical history of DM, anemia, CAD, HTN, hypercholesterolemia.  Patient receiving chemotherapy (folfox).    Spoke with patient during infusion today.  Patient reports that he had a great appetitie for few days after first treatment then got a little nauseated then appetite picked back up.  Reports that nephew and his wife moved in with him recently and will be with him for 2 months during treatment.  Reports that his neighbors cook evening meal for him every night.  Reports that typically he will eat cereal or oatmeal for breakfast, lunch is grilled chicken sandwich and supper is meat and vegetables.  Reports that he drinks diet drinks.  Does not really eat that much sweets. Does drink boost original at times, not every day.     Nutrition Focused Physical Exam: deferred   Medications: decadron, zofran, compazine, fe sulfate, 75/25 insulin   Labs: glucose 143   Anthropometrics:   Height: 63 inches Weight: 161 lb today UBW: 179 lb on 08/04/2018 BMI: 28  10% weight loss in the last month, significant   Estimated Energy Needs  Kcals: 2200-2500 Protein: 110-125 g Fluid: 2.5 L   NUTRITION DIAGNOSIS: Inadequate oral intake related to cancer as evidenced by 10% weight loss in 1 month.   INTERVENTION:  Discussed ways to add calories and protein in diet to prevent further weight loss.   Encouraged oral nutrition supplements either original or glucose control options at this time with reported good appetite and weight gain at this time.  May need to switch to higher calorie shake if appetite and weight decline. Samples provided to patient today.     MONITORING, EVALUATION, GOAL: Patient will consume adequate calories and protein to prevent weight loss and maintain muscle mass   Next Visit: Monday, July 13  Sheresa Cullop B.  Zenia Resides, [place], [place] Registered Dietitian [phone] (pager)

## 2018-09-08 LAB — CEA: CEA: 1.6 ng/mL (ref 0.0–4.7)

## 2018-09-09 ENCOUNTER — Other Ambulatory Visit: Payer: Self-pay

## 2018-09-09 ENCOUNTER — Inpatient Hospital Stay: Payer: Medicare Other | Attending: Oncology

## 2018-09-09 VITALS — BP 132/75 | HR 65 | Temp 97.0°F | Resp 17

## 2018-09-09 DIAGNOSIS — G893 Neoplasm related pain (acute) (chronic): Secondary | ICD-10-CM | POA: Insufficient documentation

## 2018-09-09 DIAGNOSIS — D509 Iron deficiency anemia, unspecified: Secondary | ICD-10-CM | POA: Insufficient documentation

## 2018-09-09 DIAGNOSIS — Z79899 Other long term (current) drug therapy: Secondary | ICD-10-CM | POA: Diagnosis not present

## 2018-09-09 DIAGNOSIS — E119 Type 2 diabetes mellitus without complications: Secondary | ICD-10-CM | POA: Diagnosis not present

## 2018-09-09 DIAGNOSIS — C20 Malignant neoplasm of rectum: Secondary | ICD-10-CM

## 2018-09-09 DIAGNOSIS — Z5189 Encounter for other specified aftercare: Secondary | ICD-10-CM | POA: Insufficient documentation

## 2018-09-09 DIAGNOSIS — I10 Essential (primary) hypertension: Secondary | ICD-10-CM | POA: Insufficient documentation

## 2018-09-09 DIAGNOSIS — E785 Hyperlipidemia, unspecified: Secondary | ICD-10-CM | POA: Insufficient documentation

## 2018-09-09 DIAGNOSIS — E7800 Pure hypercholesterolemia, unspecified: Secondary | ICD-10-CM | POA: Diagnosis not present

## 2018-09-09 DIAGNOSIS — D696 Thrombocytopenia, unspecified: Secondary | ICD-10-CM | POA: Insufficient documentation

## 2018-09-09 DIAGNOSIS — Z5111 Encounter for antineoplastic chemotherapy: Secondary | ICD-10-CM | POA: Insufficient documentation

## 2018-09-09 HISTORY — DX: Malignant neoplasm of rectum: C20

## 2018-09-09 MED ORDER — HEPARIN SOD (PORK) LOCK FLUSH 100 UNIT/ML IV SOLN
500.0000 [IU] | Freq: Once | INTRAVENOUS | Status: AC | PRN
  Administered 2018-09-09: 500 [IU]
  Filled 2018-09-09: qty 5

## 2018-09-09 MED ORDER — PEGFILGRASTIM INJECTION 6 MG/0.6ML ~~LOC~~
6.0000 mg | PREFILLED_SYRINGE | Freq: Once | SUBCUTANEOUS | Status: AC
  Administered 2018-09-09: 6 mg via SUBCUTANEOUS
  Filled 2018-09-09 (×2): qty 0.6

## 2018-09-09 MED ORDER — SODIUM CHLORIDE 0.9% FLUSH
10.0000 mL | INTRAVENOUS | Status: DC | PRN
  Administered 2018-09-09: 10 mL
  Filled 2018-09-09: qty 10

## 2018-09-14 ENCOUNTER — Ambulatory Visit: Payer: Medicare Other | Admitting: Oncology

## 2018-09-14 ENCOUNTER — Other Ambulatory Visit: Payer: Medicare Other

## 2018-09-14 ENCOUNTER — Other Ambulatory Visit: Payer: Self-pay | Admitting: Oncology

## 2018-09-14 ENCOUNTER — Ambulatory Visit: Payer: Medicare Other

## 2018-09-18 ENCOUNTER — Other Ambulatory Visit: Payer: Self-pay

## 2018-09-21 ENCOUNTER — Inpatient Hospital Stay: Payer: Medicare Other

## 2018-09-21 ENCOUNTER — Other Ambulatory Visit: Payer: Self-pay

## 2018-09-21 ENCOUNTER — Inpatient Hospital Stay (HOSPITAL_BASED_OUTPATIENT_CLINIC_OR_DEPARTMENT_OTHER): Payer: Medicare Other | Admitting: Oncology

## 2018-09-21 ENCOUNTER — Ambulatory Visit: Payer: Medicare Other

## 2018-09-21 ENCOUNTER — Encounter: Payer: Self-pay | Admitting: Oncology

## 2018-09-21 ENCOUNTER — Other Ambulatory Visit: Payer: Medicare Other

## 2018-09-21 VITALS — BP 123/66 | HR 80 | Temp 95.4°F | Resp 18 | Ht 63.0 in | Wt 158.0 lb

## 2018-09-21 DIAGNOSIS — D509 Iron deficiency anemia, unspecified: Secondary | ICD-10-CM

## 2018-09-21 DIAGNOSIS — G893 Neoplasm related pain (acute) (chronic): Secondary | ICD-10-CM

## 2018-09-21 DIAGNOSIS — E119 Type 2 diabetes mellitus without complications: Secondary | ICD-10-CM

## 2018-09-21 DIAGNOSIS — Z95828 Presence of other vascular implants and grafts: Secondary | ICD-10-CM

## 2018-09-21 DIAGNOSIS — E785 Hyperlipidemia, unspecified: Secondary | ICD-10-CM

## 2018-09-21 DIAGNOSIS — Z79899 Other long term (current) drug therapy: Secondary | ICD-10-CM

## 2018-09-21 DIAGNOSIS — Z5111 Encounter for antineoplastic chemotherapy: Secondary | ICD-10-CM | POA: Diagnosis not present

## 2018-09-21 DIAGNOSIS — C20 Malignant neoplasm of rectum: Secondary | ICD-10-CM

## 2018-09-21 DIAGNOSIS — I10 Essential (primary) hypertension: Secondary | ICD-10-CM

## 2018-09-21 DIAGNOSIS — E7800 Pure hypercholesterolemia, unspecified: Secondary | ICD-10-CM

## 2018-09-21 LAB — CBC WITH DIFFERENTIAL/PLATELET
Abs Immature Granulocytes: 0.94 10*3/uL — ABNORMAL HIGH (ref 0.00–0.07)
Basophils Absolute: 0.1 10*3/uL (ref 0.0–0.1)
Basophils Relative: 1 %
Eosinophils Absolute: 0 10*3/uL (ref 0.0–0.5)
Eosinophils Relative: 0 %
HCT: 31.5 % — ABNORMAL LOW (ref 39.0–52.0)
Hemoglobin: 10.4 g/dL — ABNORMAL LOW (ref 13.0–17.0)
Immature Granulocytes: 9 %
Lymphocytes Relative: 23 %
Lymphs Abs: 2.4 10*3/uL (ref 0.7–4.0)
MCH: 29 pg (ref 26.0–34.0)
MCHC: 33 g/dL (ref 30.0–36.0)
MCV: 87.7 fL (ref 80.0–100.0)
Monocytes Absolute: 0.7 10*3/uL (ref 0.1–1.0)
Monocytes Relative: 7 %
Neutro Abs: 6.5 10*3/uL (ref 1.7–7.7)
Neutrophils Relative %: 60 %
Platelets: 203 10*3/uL (ref 150–400)
RBC: 3.59 MIL/uL — ABNORMAL LOW (ref 4.22–5.81)
RDW: 19.3 % — ABNORMAL HIGH (ref 11.5–15.5)
Smear Review: NORMAL
WBC: 10.7 10*3/uL — ABNORMAL HIGH (ref 4.0–10.5)
nRBC: 0.7 % — ABNORMAL HIGH (ref 0.0–0.2)

## 2018-09-21 LAB — COMPREHENSIVE METABOLIC PANEL
ALT: 14 U/L (ref 0–44)
AST: 15 U/L (ref 15–41)
Albumin: 3.3 g/dL — ABNORMAL LOW (ref 3.5–5.0)
Alkaline Phosphatase: 89 U/L (ref 38–126)
Anion gap: 6 (ref 5–15)
BUN: 12 mg/dL (ref 8–23)
CO2: 24 mmol/L (ref 22–32)
Calcium: 8.5 mg/dL — ABNORMAL LOW (ref 8.9–10.3)
Chloride: 105 mmol/L (ref 98–111)
Creatinine, Ser: 0.99 mg/dL (ref 0.61–1.24)
GFR calc Af Amer: >60 mL/min (ref >60)
GFR calc non Af Amer: >60 mL/min (ref >60)
Glucose, Bld: 161 mg/dL — ABNORMAL HIGH (ref 70–99)
Potassium: 3.8 mmol/L (ref 3.5–5.1)
Sodium: 135 mmol/L (ref 135–145)
Total Bilirubin: 0.4 mg/dL (ref 0.3–1.2)
Total Protein: 6.7 g/dL (ref 6.5–8.1)

## 2018-09-21 MED ORDER — SODIUM CHLORIDE 0.9% FLUSH
10.0000 mL | Freq: Once | INTRAVENOUS | Status: AC
  Administered 2018-09-21: 10 mL via INTRAVENOUS
  Filled 2018-09-21: qty 10

## 2018-09-21 MED ORDER — FLUOROURACIL CHEMO INJECTION 2.5 GM/50ML
400.0000 mg/m2 | Freq: Once | INTRAVENOUS | Status: AC
  Administered 2018-09-21: 750 mg via INTRAVENOUS
  Filled 2018-09-21: qty 15

## 2018-09-21 MED ORDER — LEUCOVORIN CALCIUM INJECTION 350 MG
750.0000 mg | Freq: Once | INTRAVENOUS | Status: AC
  Administered 2018-09-21: 750 mg via INTRAVENOUS
  Filled 2018-09-21: qty 37.5

## 2018-09-21 MED ORDER — DEXTROSE 5 % IV SOLN
Freq: Once | INTRAVENOUS | Status: AC
  Administered 2018-09-21: 10:00:00 via INTRAVENOUS
  Filled 2018-09-21: qty 250

## 2018-09-21 MED ORDER — SODIUM CHLORIDE 0.9 % IV SOLN
2400.0000 mg/m2 | INTRAVENOUS | Status: DC
  Administered 2018-09-21: 4550 mg via INTRAVENOUS
  Filled 2018-09-21: qty 91

## 2018-09-21 MED ORDER — PALONOSETRON HCL INJECTION 0.25 MG/5ML
0.2500 mg | Freq: Once | INTRAVENOUS | Status: AC
  Administered 2018-09-21: 10:00:00 0.25 mg via INTRAVENOUS
  Filled 2018-09-21: qty 5

## 2018-09-21 MED ORDER — DEXAMETHASONE SODIUM PHOSPHATE 10 MG/ML IJ SOLN
10.0000 mg | Freq: Once | INTRAMUSCULAR | Status: AC
  Administered 2018-09-21: 10 mg via INTRAVENOUS
  Filled 2018-09-21: qty 1

## 2018-09-21 MED ORDER — OXALIPLATIN CHEMO INJECTION 100 MG/20ML
65.0000 mg/m2 | Freq: Once | INTRAVENOUS | Status: AC
  Administered 2018-09-21: 125 mg via INTRAVENOUS
  Filled 2018-09-21: qty 20

## 2018-09-21 NOTE — Progress Notes (Signed)
Pt. States he has lost a lb. He states for 2-4 days after treatment he does not eat as well buton day 5 and beyond he eats a lot. He wants to know it he needs to cont. Iron pills, and he wants to travel to put flowers on graves of family and it is 300 mile round trip. No problems with side effects of chemo except appetite.

## 2018-09-21 NOTE — Progress Notes (Signed)
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 He was recently found to have iron deficiency anemia which led to a colonoscopy On 07/15/2018. Colonoscopy showed a villous partially obstructing medium-sized mass in the mid sigmoid colon. The mass was partially circumferential measuring 5 cm. There was another infiltrative partially obstructing medium-sized mass found in the rectosigmoid colon which also measured 5 cm. Pathology from the sigmoid colon mass showed high-grade dysplasia at least involving an adenomatous lesion with villous features. A more serious process is not excluded. Rectosigmoid mass biopsy showed invasive colorectal adenocarcinoma.  Patient was referred to Dr. [name] for surgical management. Patient had a CT abdomen and pelvis with contrast which showed irregular asymmetric mural thickening of the proximal rectum measuring over 6 to 7 cm. Potential local nodal metastases with several subcentimeter iliac lymph nodes and presacral lymph nodes. No evidence of metastatic disease outside the pelvis.  Patient also had MRI of the pelvis with and without contrast which showed tumor length 8.5 cm with extension through muscularis propria decreased C. There is diffuse involvement of muscularis propria. No extramural vascular invasion or tumor thrombus. Shortest distance of any  tumor/note from mesorectal fascia 1 to 2 mm. No involvement of adjacent organs or pelvic sidewall. Mesorectal lymph nodes within perirectal fat including 6 mm node and a low sigmoid mesocolon node measuring 6 mm. Another 6 mm node within the low sigmoid mesocolon representing extremity rectal lymphadenopathy. T3N2 by MRI. Distance from tumor to internal anal sphincter is 6.2 cm.  Plan is to proceed with total neoadjuvant chemotherapy followed by definitive surgery.  Interval history-rectal pain is improved and he is only using 1 oxycodone per day.  Denies any significant nausea vomiting with chemotherapy.  Denies any peripheral neuropathy.  ECOG PS- 1 Pain scale- 0 Opioid associated constipation- no  Review of systems- Review of Systems  Constitutional: Positive for malaise/fatigue. Negative for chills, fever and weight loss.  HENT: Negative for congestion, ear discharge and nosebleeds.   Eyes: Negative for blurred vision.  Respiratory: Negative for cough, hemoptysis, sputum production, shortness of breath and wheezing.   Cardiovascular: Negative for chest pain, palpitations, orthopnea and claudication.  Gastrointestinal: Negative for abdominal pain, blood in stool, constipation, diarrhea, heartburn, melena, nausea and vomiting.  Genitourinary: Negative for dysuria, flank pain, frequency, hematuria and urgency.  Musculoskeletal: Negative for back pain, joint pain and myalgias.  Skin: Negative for rash.  Neurological: Negative for dizziness, tingling, focal weakness, seizures, weakness and headaches.  Endo/Heme/Allergies: Does not bruise/bleed easily.  Psychiatric/Behavioral: Negative for depression and suicidal ideas. The patient does not have insomnia.      No Known Allergies   Past Medical History:  Diagnosis Date  . Anemia   . Cancer ([place])   . Coronary artery disease   . Diabetes mellitus without complication ([place])   . History of hiatal hernia   . Hypercholesterolemia    . Hypertension      Past Surgical History:  Procedure  Laterality Date  . COLONOSCOPY WITH PROPOFOL N/A 07/15/2018   Procedure: COLONOSCOPY WITH PROPOFOL;  Surgeon: [name], [name], MD;  Location: [facility] ENDOSCOPY;  Service: Gastroenterology;  Laterality: N/A;  . ESOPHAGOGASTRODUODENOSCOPY (EGD) WITH PROPOFOL N/A 07/15/2018   Procedure: ESOPHAGOGASTRODUODENOSCOPY (EGD) WITH PROPOFOL;  Surgeon: [name], [name], MD;  Location: [facility] ENDOSCOPY;  Service: Gastroenterology;  Laterality: N/A;  . PORTACATH PLACEMENT N/A 08/13/2018   Procedure: INSERTION PORT-A-CATH;  Surgeon: [name], MD;  Location: [facility] ORS;  Service: General;  Laterality: N/A;  . PROSTATE SURGERY      Social History   Socioeconomic History  . Marital status: Widowed    Spouse name: Not on file  . Number of children: 0  . Years of education: Not on file  . Highest education level: Not on file  Occupational History  . Occupation: Pharmacist, hospital    Comment: Retired  Scientific laboratory technician  . Financial resource strain: Not hard at all  . Food insecurity    Worry: Never true    Inability: Never true  . Transportation needs    Medical: No    Non-medical: No  Tobacco Use  . Smoking status: Never Smoker  . Smokeless tobacco: Never Used  Substance and Sexual Activity  . Alcohol use: Not Currently  . Drug use: Never  . Sexual activity: Not Currently  Lifestyle  . Physical activity    Days per week: 2 days    Minutes per session: 30 min  . Stress: Only a little  Relationships  . Social connections    Talks on phone: More than three times a week    Gets together: More than three times a week    Attends religious service: Not on file    Active member of club or organization: Not on file    Attends meetings of clubs or organizations: Not on file    Relationship status: Widowed  . Intimate partner violence    Fear of current or ex partner: No    Emotionally abused: No    Physically abused: No    Forced sexual activity:  No  Other Topics Concern  . 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  His wife was a Camera operator and they traveled frequently.  Several nieces and nephews.  He is the youngest of his brothers and sisters and only surviving.    History reviewed. No pertinent family history.   Current Outpatient Medications:  .  dexamethasone (DECADRON) 4 MG tablet, Take 2 tablets (8 mg total) by mouth daily. Start the day after chemotherapy for 2 days. Take with food., Disp: 30 tablet, Rfl: 1 .  ezetimibe (ZETIA) 10 MG tablet, Take 10 mg by mouth every morning. , Disp: , Rfl:  .  HUMALOG MIX 75/25 KWIKPEN (75-25) 100 UNIT/ML Kwikpen, Inject 30 Units into the skin every morning. , Disp: , Rfl:  .  lidocaine-prilocaine (EMLA) cream, Apply to affected area once, Disp: 30 g, Rfl: 3 .  lisinopril (PRINIVIL,ZESTRIL) 5 MG tablet, Take 5 mg by mouth every morning. , Disp: , Rfl:  .  metoprolol succinate (TOPROL-XL) 50 MG 24 hr tablet, Take 50 mg by mouth every morning. , Disp: , Rfl:  .  niacin (NIASPAN) 500 MG CR tablet, Take 500 mg by mouth every evening. , Disp: , Rfl:  .  ondansetron (ZOFRAN) 8 MG tablet, Take 1 tablet (  8 mg total) by mouth 2 (two) times daily as needed for refractory nausea / vomiting. Start on day 3 after chemotherapy., Disp: 30 tablet, Rfl: 1 .  oxyCODONE (OXY IR/ROXICODONE) 5 MG immediate release tablet, Take 1 tablet (5 mg total) by mouth every 6 (six) hours as needed for severe pain., Disp: 60 tablet, Rfl: 0 .  pantoprazole (PROTONIX) 40 MG tablet, Take 40 mg by mouth every morning. , Disp: , Rfl:  .  prochlorperazine (COMPAZINE) 10 MG tablet, Take 1 tablet (10 mg total) by mouth every 6 (six) hours as needed (Nausea or vomiting)., Disp: 30 tablet, Rfl: 1 .  traZODone (DESYREL) 50 MG tablet, Take 50 mg by mouth at bedtime as needed for sleep. , Disp: , Rfl:   .  ferrous sulfate 325 (65 FE) MG tablet, Take 325 mg by mouth daily., Disp: , Rfl:  .  nystatin (MYCOSTATIN) 100000 UNIT/ML suspension, TAKE 1 TEASPOONSFUL BY MOUTH 4 TIMES DAILY (Patient not taking: Reported on 09/21/2018), Disp: 120 mL, Rfl: 1  Physical exam:  Vitals:   09/21/18 0842  BP: 123/66  Pulse: 80  Resp: 18  Temp: (!) 95.4 F (35.2 C)  TempSrc: Oral  Weight: 158 lb (71.7 kg)  Height: 5\' 3"  (1.6 m)   Physical Exam Constitutional:      General: He is not in acute distress. HENT:     Head: Normocephalic and atraumatic.  Eyes:     Pupils: Pupils are equal, round, and reactive to light.  Neck:     Musculoskeletal: Normal range of motion.  Cardiovascular:     Rate and Rhythm: Normal rate and regular rhythm.     Heart sounds: Normal heart sounds.  Pulmonary:     Effort: Pulmonary effort is normal.     Breath sounds: Normal breath sounds.  Abdominal:     General: Bowel sounds are normal.     Palpations: Abdomen is soft.  Skin:    General: Skin is warm and dry.  Neurological:     Mental Status: He is alert and oriented to person, place, and time.      CMP Latest Ref Rng & Units 09/21/2018  Glucose 70 - 99 mg/dL 161(H)  BUN 8 - 23 mg/dL 12  Creatinine 0.61 - 1.24 mg/dL 0.99  Sodium 135 - 145 mmol/L 135  Potassium 3.5 - 5.1 mmol/L 3.8  Chloride 98 - 111 mmol/L 105  CO2 22 - 32 mmol/L 24  Calcium 8.9 - 10.3 mg/dL 8.5(L)  Total Protein 6.5 - 8.1 g/dL 6.7  Total Bilirubin 0.3 - 1.2 mg/dL 0.4  Alkaline Phos 38 - 126 U/L 89  AST 15 - 41 U/L 15  ALT 0 - 44 U/L 14   CBC Latest Ref Rng & Units 09/21/2018  WBC 4.0 - 10.5 K/uL 10.7(H)  Hemoglobin 13.0 - 17.0 g/dL 10.4(L)  Hematocrit 39.0 - 52.0 % 31.5(L)  Platelets 150 - 400 K/uL 203      Assessment and plan- Patient is a 78 y.o. male with stage III B rectal adenocarcinoma cT3 cN3cM0.  He is here for on treatment assessment prior to cycle 3 of neoadjuvant FOLFOX chemotherapy  Counts okay to proceed with cycle  3 of neoadjuvant FOLFOX chemotherapy today.  He will come back on day 3 for pump disconnect and receive Udenyca on that day.  I will see him back in 2 weeks time with CBC with differential, CMP for cycle 4.  Iron deficiency anemia: Status post 2 doses of Feraheme.  We will repeat iron studies in 6 weeks time  Neoplasm related pain: Continue PRN oxycodone  AKI: Resolved   Visit Diagnosis 1. 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[place])   2. Encounter for antineoplastic chemotherapy   3. Iron deficiency anemia, unspecified iron deficiency anemia type      Dr. Randa Evens, MD, MPH [name] at [facility] [phone] 09/21/2018 8:59 AM

## 2018-09-21 NOTE — Progress Notes (Signed)
Nutrition Follow-up:  Patient with stage III rectal adenocarcinoma.  Patient receiving chemotherapy (folfox)  Spoke with patient today in infusion for nutrition follow-up.  Patient continues to report poor appetite for few days after chemo then appetite increases and eats well.  Has purchased boost glucose control but does not drink it daily. Reports that he is typically eating 3 meals per day.    Medications: reviewed  Labs: glucose 161  Anthropometrics:   Weight 158 lb decreased from 161 lb on 7/13   NUTRITION DIAGNOSIS: Inadequate oral intake stable   INTERVENTION:  Many questions about food safety and sanitation. Answered to patient's satisfaction.  Encouraged oral nutrition supplement 1-2 times daily to add additional calories and prevent weight from declining.     MONITORING, EVALUATION, GOAL: Patient will consume adequate calories and protein to prevent weight loss and maintain muscle mass.    NEXT VISIT: August 10th  Willie [name] B. Zenia Resides, [place], [place] Registered Dietitian [phone] (pager)

## 2018-09-23 ENCOUNTER — Inpatient Hospital Stay: Payer: Medicare Other

## 2018-09-23 ENCOUNTER — Other Ambulatory Visit: Payer: Self-pay

## 2018-09-23 DIAGNOSIS — Z5111 Encounter for antineoplastic chemotherapy: Secondary | ICD-10-CM | POA: Diagnosis not present

## 2018-09-23 DIAGNOSIS — C20 Malignant neoplasm of rectum: Secondary | ICD-10-CM

## 2018-09-23 MED ORDER — PEGFILGRASTIM-CBQV 6 MG/0.6ML ~~LOC~~ SOSY
6.0000 mg | PREFILLED_SYRINGE | Freq: Once | SUBCUTANEOUS | Status: AC
  Administered 2018-09-23: 6 mg via SUBCUTANEOUS
  Filled 2018-09-23: qty 0.6

## 2018-09-23 MED ORDER — HEPARIN SOD (PORK) LOCK FLUSH 100 UNIT/ML IV SOLN
500.0000 [IU] | Freq: Once | INTRAVENOUS | Status: AC | PRN
  Administered 2018-09-23: 500 [IU]
  Filled 2018-09-23: qty 5

## 2018-09-23 MED ORDER — SODIUM CHLORIDE 0.9% FLUSH
10.0000 mL | INTRAVENOUS | Status: DC | PRN
  Administered 2018-09-23: 10 mL
  Filled 2018-09-23: qty 10

## 2018-09-25 ENCOUNTER — Telehealth: Payer: Self-pay | Admitting: *Deleted

## 2018-09-25 MED ORDER — BACLOFEN 5 MG PO TABS: 5.0000 mg | ORAL_TABLET | Freq: Three times a day (TID) | ORAL | 0 refills | Status: DC

## 2018-09-25 NOTE — Telephone Encounter (Signed)
He can try baclofen 5 mg TID. Can you please send him the prescription

## 2018-09-25 NOTE — Telephone Encounter (Signed)
Patient called reporting that he has had hiccoughs since yesterday and he wants to know how to get rid of them. Please advise

## 2018-09-25 NOTE — Telephone Encounter (Signed)
Patient informed of prescription and he comfirmed [facility] as the correct pharmacy

## 2018-10-02 ENCOUNTER — Other Ambulatory Visit: Payer: Self-pay

## 2018-10-05 ENCOUNTER — Inpatient Hospital Stay (HOSPITAL_BASED_OUTPATIENT_CLINIC_OR_DEPARTMENT_OTHER): Payer: Medicare Other | Admitting: Oncology

## 2018-10-05 ENCOUNTER — Inpatient Hospital Stay: Payer: Medicare Other

## 2018-10-05 ENCOUNTER — Other Ambulatory Visit: Payer: Self-pay

## 2018-10-05 ENCOUNTER — Encounter: Payer: Self-pay | Admitting: Oncology

## 2018-10-05 VITALS — BP 103/68 | HR 58 | Temp 98.3°F | Resp 16 | Ht 63.0 in | Wt 154.9 lb

## 2018-10-05 DIAGNOSIS — E785 Hyperlipidemia, unspecified: Secondary | ICD-10-CM

## 2018-10-05 DIAGNOSIS — Z79899 Other long term (current) drug therapy: Secondary | ICD-10-CM

## 2018-10-05 DIAGNOSIS — D6959 Other secondary thrombocytopenia: Secondary | ICD-10-CM

## 2018-10-05 DIAGNOSIS — G893 Neoplasm related pain (acute) (chronic): Secondary | ICD-10-CM | POA: Diagnosis not present

## 2018-10-05 DIAGNOSIS — E119 Type 2 diabetes mellitus without complications: Secondary | ICD-10-CM

## 2018-10-05 DIAGNOSIS — Z5111 Encounter for antineoplastic chemotherapy: Secondary | ICD-10-CM | POA: Diagnosis not present

## 2018-10-05 DIAGNOSIS — D696 Thrombocytopenia, unspecified: Secondary | ICD-10-CM | POA: Diagnosis not present

## 2018-10-05 DIAGNOSIS — C20 Malignant neoplasm of rectum: Secondary | ICD-10-CM | POA: Diagnosis not present

## 2018-10-05 DIAGNOSIS — D509 Iron deficiency anemia, unspecified: Secondary | ICD-10-CM | POA: Diagnosis not present

## 2018-10-05 DIAGNOSIS — E7800 Pure hypercholesterolemia, unspecified: Secondary | ICD-10-CM

## 2018-10-05 DIAGNOSIS — Z95828 Presence of other vascular implants and grafts: Secondary | ICD-10-CM

## 2018-10-05 DIAGNOSIS — I10 Essential (primary) hypertension: Secondary | ICD-10-CM

## 2018-10-05 DIAGNOSIS — T451X5A Adverse effect of antineoplastic and immunosuppressive drugs, initial encounter: Secondary | ICD-10-CM

## 2018-10-05 LAB — COMPREHENSIVE METABOLIC PANEL
ALT: 13 U/L (ref 0–44)
AST: 16 U/L (ref 15–41)
Albumin: 3.3 g/dL — ABNORMAL LOW (ref 3.5–5.0)
Alkaline Phosphatase: 83 U/L (ref 38–126)
Anion gap: 7 (ref 5–15)
BUN: 9 mg/dL (ref 8–23)
CO2: 21 mmol/L — ABNORMAL LOW (ref 22–32)
Calcium: 8.6 mg/dL — ABNORMAL LOW (ref 8.9–10.3)
Chloride: 108 mmol/L (ref 98–111)
Creatinine, Ser: 1.19 mg/dL (ref 0.61–1.24)
GFR calc Af Amer: >60 mL/min (ref >60)
GFR calc non Af Amer: 57 mL/min — ABNORMAL LOW (ref >60)
Glucose, Bld: 146 mg/dL — ABNORMAL HIGH (ref 70–99)
Potassium: 3.6 mmol/L (ref 3.5–5.1)
Sodium: 136 mmol/L (ref 135–145)
Total Bilirubin: 0.4 mg/dL (ref 0.3–1.2)
Total Protein: 6.2 g/dL — ABNORMAL LOW (ref 6.5–8.1)

## 2018-10-05 LAB — CBC WITH DIFFERENTIAL/PLATELET
Abs Immature Granulocytes: 1.09 10*3/uL — ABNORMAL HIGH (ref 0.00–0.07)
Basophils Absolute: 0 10*3/uL (ref 0.0–0.1)
Basophils Relative: 0 %
Eosinophils Absolute: 0 10*3/uL (ref 0.0–0.5)
Eosinophils Relative: 0 %
HCT: 31.2 % — ABNORMAL LOW (ref 39.0–52.0)
Hemoglobin: 10.4 g/dL — ABNORMAL LOW (ref 13.0–17.0)
Immature Granulocytes: 12 %
Lymphocytes Relative: 26 %
Lymphs Abs: 2.4 10*3/uL (ref 0.7–4.0)
MCH: 29.7 pg (ref 26.0–34.0)
MCHC: 33.3 g/dL (ref 30.0–36.0)
MCV: 89.1 fL (ref 80.0–100.0)
Monocytes Absolute: 0.6 10*3/uL (ref 0.1–1.0)
Monocytes Relative: 7 %
Neutro Abs: 5.1 10*3/uL (ref 1.7–7.7)
Neutrophils Relative %: 55 %
Platelets: 117 10*3/uL — ABNORMAL LOW (ref 150–400)
RBC: 3.5 MIL/uL — ABNORMAL LOW (ref 4.22–5.81)
RDW: 20.5 % — ABNORMAL HIGH (ref 11.5–15.5)
Smear Review: DECREASED
WBC: 9.3 10*3/uL (ref 4.0–10.5)
nRBC: 0.9 % — ABNORMAL HIGH (ref 0.0–0.2)

## 2018-10-05 MED ORDER — PALONOSETRON HCL INJECTION 0.25 MG/5ML
0.2500 mg | Freq: Once | INTRAVENOUS | Status: AC
  Administered 2018-10-05: 0.25 mg via INTRAVENOUS
  Filled 2018-10-05: qty 5

## 2018-10-05 MED ORDER — LEUCOVORIN CALCIUM INJECTION 350 MG
750.0000 mg | Freq: Once | INTRAVENOUS | Status: AC
  Administered 2018-10-05: 750 mg via INTRAVENOUS
  Filled 2018-10-05: qty 25

## 2018-10-05 MED ORDER — DEXAMETHASONE SODIUM PHOSPHATE 10 MG/ML IJ SOLN
10.0000 mg | Freq: Once | INTRAMUSCULAR | Status: AC
  Administered 2018-10-05: 09:00:00 10 mg via INTRAVENOUS
  Filled 2018-10-05: qty 1

## 2018-10-05 MED ORDER — FLUOROURACIL CHEMO INJECTION 2.5 GM/50ML
400.0000 mg/m2 | Freq: Once | INTRAVENOUS | Status: AC
  Administered 2018-10-05: 750 mg via INTRAVENOUS
  Filled 2018-10-05: qty 15

## 2018-10-05 MED ORDER — DEXTROSE 5 % IV SOLN
Freq: Once | INTRAVENOUS | Status: AC
  Administered 2018-10-05: 09:00:00 via INTRAVENOUS
  Filled 2018-10-05: qty 250

## 2018-10-05 MED ORDER — SODIUM CHLORIDE 0.9 % IV SOLN
2400.0000 mg/m2 | INTRAVENOUS | Status: DC
  Administered 2018-10-05: 4550 mg via INTRAVENOUS
  Filled 2018-10-05: qty 91

## 2018-10-05 MED ORDER — SODIUM CHLORIDE 0.9% FLUSH
10.0000 mL | Freq: Once | INTRAVENOUS | Status: AC
  Administered 2018-10-05: 10 mL via INTRAVENOUS
  Filled 2018-10-05: qty 10

## 2018-10-05 MED ORDER — OXALIPLATIN CHEMO INJECTION 100 MG/20ML
65.0000 mg/m2 | Freq: Once | INTRAVENOUS | Status: AC
  Administered 2018-10-05: 10:00:00 125 mg via INTRAVENOUS
  Filled 2018-10-05: qty 20

## 2018-10-05 NOTE — Progress Notes (Signed)
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 He was recently found to have iron deficiency anemia which led to a colonoscopy On 07/15/2018. Colonoscopy showed a villous partially obstructing medium-sized mass in the mid sigmoid colon. The mass was partially circumferential measuring 5 cm. There was another infiltrative partially obstructing medium-sized mass found in the rectosigmoid colon which also measured 5 cm. Pathology from the sigmoid colon mass showed high-grade dysplasia at least involving an adenomatous lesion with villous features. A more serious process is not excluded. Rectosigmoid mass biopsy showed invasive colorectal adenocarcinoma.  Patient was referred to Dr. [name] for surgical management. Patient had a CT abdomen and pelvis with contrast which showed irregular asymmetric mural thickening of the proximal rectum measuring over 6 to 7 cm. Potential local nodal metastases with several subcentimeter iliac lymph nodes and presacral lymph nodes. No evidence of metastatic disease outside the pelvis.  Patient also had MRI of the pelvis with and without contrast which showed tumor length 8.5 cm with extension through muscularis propria decreased C. There is diffuse involvement of muscularis propria. No extramural vascular invasion or tumor thrombus. Shortest distance of any  tumor/note from mesorectal fascia 1 to 2 mm. No involvement of adjacent organs or pelvic sidewall. Mesorectal lymph nodes within perirectal fat including 6 mm node and a low sigmoid mesocolon node measuring 6 mm. Another 6 mm node within the low sigmoid mesocolon representing extremity rectal lymphadenopathy. T3N2 by MRI. Distance from tumor to internal anal sphincter is 6.2 cm.  Plan is to proceed with total neoadjuvant chemotherapy followed by definitive surgery.  Interval history-rectal pain is overall better controlled and he uses about 1 oxycodone per day.  He denies any significant nausea vomiting and is overall eating well but has lost about 4 pounds in the last 1 month.  Denies any mouth sores  ECOG PS- 1 Pain scale- 0 Opioid associated constipation- no  Review of systems- Review of Systems  Constitutional: Positive for malaise/fatigue and weight loss. Negative for chills and fever.  HENT: Negative for congestion, ear discharge and nosebleeds.   Eyes: Negative for blurred vision.  Respiratory: Negative for cough, hemoptysis, sputum production, shortness of breath and wheezing.   Cardiovascular: Negative for chest pain, palpitations, orthopnea and claudication.  Gastrointestinal: Negative for abdominal pain, blood in stool, constipation, diarrhea, heartburn, melena, nausea and vomiting.  Genitourinary: Negative for dysuria, flank pain, frequency, hematuria and urgency.  Musculoskeletal: Negative for back pain, joint pain and myalgias.  Skin: Negative for rash.  Neurological: Negative for dizziness, tingling, focal weakness, seizures, weakness and headaches.  Endo/Heme/Allergies: Does not bruise/bleed easily.  Psychiatric/Behavioral: Negative for depression and suicidal ideas. The patient does not have insomnia.       No Known Allergies   Past Medical History:  Diagnosis Date  . Anemia   . Cancer ([place])   . Coronary artery disease   . Diabetes mellitus without  complication ([place])   . History of hiatal hernia   .  Hypercholesterolemia   . Hypertension      Past Surgical History:  Procedure Laterality Date  . COLONOSCOPY WITH PROPOFOL N/A 07/15/2018   Procedure: COLONOSCOPY WITH PROPOFOL;  Surgeon: [name], [name], MD;  Location: [facility] ENDOSCOPY;  Service: Gastroenterology;  Laterality: N/A;  . ESOPHAGOGASTRODUODENOSCOPY (EGD) WITH PROPOFOL N/A 07/15/2018   Procedure: ESOPHAGOGASTRODUODENOSCOPY (EGD) WITH PROPOFOL;  Surgeon: [name], [name], MD;  Location: [facility] ENDOSCOPY;  Service: Gastroenterology;  Laterality: N/A;  . PORTACATH PLACEMENT N/A 08/13/2018   Procedure: INSERTION PORT-A-CATH;  Surgeon: [name], MD;  Location: [facility] ORS;  Service: General;  Laterality: N/A;  . PROSTATE SURGERY      Social History   Socioeconomic History  . Marital status: Widowed    Spouse name: Not on file  . Number of children: 0  . Years of education: Not on file  . Highest education level: Not on file  Occupational History  . Occupation: Pharmacist, hospital    Comment: Retired  Scientific laboratory technician  . Financial resource strain: Not hard at all  . Food insecurity    Worry: Never true    Inability: Never true  . Transportation needs    Medical: No    Non-medical: No  Tobacco Use  . Smoking status: Never Smoker  . Smokeless tobacco: Never Used  Substance and Sexual Activity  . Alcohol use: Not Currently  . Drug use: Never  . Sexual activity: Not Currently  Lifestyle  . Physical activity    Days per week: 2 days    Minutes per session: 30 min  . Stress: Only a little  Relationships  . Social connections    Talks on phone: More than three times a week    Gets together: More than three times a week    Attends religious service: Not on file    Active member of club or organization: Not on file    Attends meetings of clubs or organizations: Not on file    Relationship status: Widowed  . Intimate partner violence    Fear of current or ex partner: No     Emotionally abused: No    Physically abused: No    Forced sexual activity: No  Other Topics Concern  . 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  His wife was a Camera operator and they traveled frequently.  Several nieces and nephews.  He is the youngest of his brothers and sisters and only surviving.    No family history on file.   Current Outpatient Medications:  .  Baclofen 5 MG TABS, Take 5 mg by mouth 3 (three) times daily., Disp: 90 tablet, Rfl: 0 .  dexamethasone (DECADRON) 4 MG tablet, Take 2 tablets (8 mg total) by mouth daily. Start the day after chemotherapy for 2 days. Take with food., Disp: 30 tablet, Rfl: 1 .  ezetimibe (ZETIA) 10 MG tablet, Take 10 mg by mouth every morning. , Disp: , Rfl:  .  ferrous sulfate 325 (65 FE) MG tablet, Take 325 mg by mouth daily., Disp: , Rfl:  .  HUMALOG MIX 75/25 KWIKPEN (75-25) 100 UNIT/ML Kwikpen, Inject 30 Units into the skin every morning. , Disp: , Rfl:  .  lidocaine-prilocaine (EMLA) cream, Apply to affected area once, Disp: 30 g, Rfl: 3 .  lisinopril (PRINIVIL,ZESTRIL) 5 MG tablet, Take 5 mg by mouth every morning. , Disp: , Rfl:  .  metoprolol succinate (TOPROL-XL) 50 MG 24 hr tablet, Take 50 mg by mouth every morning. , Disp: , Rfl:  .  niacin (NIASPAN) 500 MG CR tablet, Take 500 mg by mouth every evening. , Disp: , Rfl:  .  nystatin (MYCOSTATIN) 100000 UNIT/ML suspension, TAKE 1 TEASPOONSFUL BY MOUTH 4 TIMES DAILY (Patient not taking: Reported on 09/21/2018), Disp: 120 mL, Rfl: 1 .  ondansetron (ZOFRAN) 8 MG tablet, Take 1 tablet (8 mg total) by mouth 2 (two) times daily as needed for refractory nausea / vomiting. Start on day 3 after chemotherapy., Disp: 30 tablet, Rfl: 1 .  oxyCODONE (OXY IR/ROXICODONE) 5 MG immediate release tablet, Take 1 tablet (5 mg total) by mouth every 6 (six) hours as needed for  severe pain., Disp: 60 tablet, Rfl: 0 .  pantoprazole (PROTONIX) 40 MG tablet, Take 40 mg by mouth every morning. , Disp: , Rfl:  .  prochlorperazine (COMPAZINE) 10 MG tablet, Take 1 tablet (10 mg total) by mouth every 6 (six) hours as needed (Nausea or vomiting)., Disp: 30 tablet, Rfl: 1 .  traZODone (DESYREL) 50 MG tablet, Take 50 mg by mouth at bedtime as needed for sleep. , Disp: , Rfl:   Physical exam:  Vitals:   10/05/18 0840  BP: 103/68  Pulse: (!) 58  Resp: 16  Temp: 98.3 F (36.8 C)  TempSrc: Oral  Weight: 154 lb 14.4 oz (70.3 kg)  Height: 5\' 3"  (1.6 m)   Physical Exam Constitutional:      General: He is not in acute distress. HENT:     Head: Normocephalic and atraumatic.  Eyes:     Pupils: Pupils are equal, round, and reactive to light.  Neck:     Musculoskeletal: Normal range of motion.  Cardiovascular:     Rate and Rhythm: Normal rate and regular rhythm.     Heart sounds: Normal heart sounds.  Pulmonary:     Effort: Pulmonary effort is normal.     Breath sounds: Normal breath sounds.  Abdominal:     General: Bowel sounds are normal.     Palpations: Abdomen is soft.  Skin:    General: Skin is warm and dry.  Neurological:     Mental Status: He is alert and oriented to person, place, and time.      CMP Latest Ref Rng & Units 10/05/2018  Glucose 70 - 99 mg/dL 146(H)  BUN 8 - 23 mg/dL 9  Creatinine 0.61 - 1.24 mg/dL 1.19  Sodium 135 - 145 mmol/L 136  Potassium 3.5 - 5.1 mmol/L 3.6  Chloride 98 - 111 mmol/L 108  CO2 22 - 32 mmol/L 21(L)  Calcium 8.9 - 10.3 mg/dL 8.6(L)  Total Protein 6.5 - 8.1 g/dL 6.2(L)  Total Bilirubin 0.3 - 1.2 mg/dL 0.4  Alkaline Phos 38 - 126 U/L 83  AST 15 - 41 U/L 16  ALT 0 - 44 U/L 13   CBC Latest Ref Rng & Units 10/05/2018  WBC 4.0 - 10.5 K/uL 9.3  Hemoglobin 13.0 - 17.0 g/dL 10.4(L)  Hematocrit 39.0 - 52.0 % 31.2(L)  Platelets 150 - 400 K/uL 117(L)      Assessment and plan- Patient is a 78 y.o. male with stageIIIB  rectal adenocarcinoma cT3 cN3cM0.    He is here for on treatment assessment prior to cycle 4 of neoadjuvant FOLFOX chemotherapy  Patient has mild thrombocytopenia but his platelet counts are greater than 100.  He will therefore proceed with cycle 4 of FOLFOX chemotherapy today.  He is getting oxaliplatin at 65 mg per metered square.  Thrombocytopenia likely secondary to oxaliplatin.  He will come back on day 3 for pump disconnect and receive Udenyca on that day.  I will see him back in 2 weeks time with CBC with differential, CMP for cycle 5 of FOLFOX chemotherapy.  Plan is to do 8 cycles followed by chemoradiation.  I will plan to get repeat CT chest abdomen pelvis with contrast after 6 cycles  Neoplasm related rectal pain: Continue PRN oxycodone   Visit Diagnosis 1. 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[place])   2. Encounter for antineoplastic chemotherapy   3. Chemotherapy-induced thrombocytopenia      Dr. Randa Evens, MD, MPH [facility] [phone] 10/05/2018 8:39 AM

## 2018-10-05 NOTE — Progress Notes (Signed)
First couple of days after pump d/c does not eat as well but then the rest of time gets plenty of food and liq. In.

## 2018-10-07 ENCOUNTER — Other Ambulatory Visit: Payer: Self-pay

## 2018-10-07 ENCOUNTER — Inpatient Hospital Stay: Payer: Medicare Other

## 2018-10-07 VITALS — BP 121/65 | HR 64 | Temp 95.6°F | Resp 18

## 2018-10-07 DIAGNOSIS — R531 Weakness: Secondary | ICD-10-CM | POA: Diagnosis not present

## 2018-10-07 DIAGNOSIS — A4151 Sepsis due to Escherichia coli [E. coli]: Secondary | ICD-10-CM | POA: Diagnosis not present

## 2018-10-07 DIAGNOSIS — C20 Malignant neoplasm of rectum: Secondary | ICD-10-CM

## 2018-10-07 MED ORDER — HEPARIN SOD (PORK) LOCK FLUSH 100 UNIT/ML IV SOLN
500.0000 [IU] | Freq: Once | INTRAVENOUS | Status: AC | PRN
  Administered 2018-10-07: 500 [IU]
  Filled 2018-10-07: qty 5

## 2018-10-07 MED ORDER — SODIUM CHLORIDE 0.9% FLUSH
10.0000 mL | INTRAVENOUS | Status: DC | PRN
  Administered 2018-10-07: 10 mL
  Filled 2018-10-07: qty 10

## 2018-10-07 MED ORDER — PEGFILGRASTIM-CBQV 6 MG/0.6ML ~~LOC~~ SOSY
6.0000 mg | PREFILLED_SYRINGE | Freq: Once | SUBCUTANEOUS | Status: AC
  Administered 2018-10-07: 6 mg via SUBCUTANEOUS
  Filled 2018-10-07: qty 0.6

## 2018-10-10 ENCOUNTER — Telehealth: Payer: Self-pay | Admitting: Oncology

## 2018-10-10 ENCOUNTER — Emergency Department: Payer: Medicare Other

## 2018-10-10 ENCOUNTER — Other Ambulatory Visit: Payer: Self-pay

## 2018-10-10 ENCOUNTER — Inpatient Hospital Stay
Admission: EM | Admit: 2018-10-10 | Discharge: 2018-10-17 | DRG: 871 | Disposition: A | Discharge status: Home Health | Payer: Medicare Other | Attending: Internal Medicine | Admitting: Internal Medicine

## 2018-10-10 DIAGNOSIS — R509 Fever, unspecified: Secondary | ICD-10-CM

## 2018-10-10 DIAGNOSIS — E876 Hypokalemia: Secondary | ICD-10-CM | POA: Diagnosis not present

## 2018-10-10 DIAGNOSIS — Z8711 Personal history of peptic ulcer disease: Secondary | ICD-10-CM

## 2018-10-10 DIAGNOSIS — I2510 Atherosclerotic heart disease of native coronary artery without angina pectoris: Secondary | ICD-10-CM | POA: Diagnosis present

## 2018-10-10 DIAGNOSIS — R112 Nausea with vomiting, unspecified: Secondary | ICD-10-CM

## 2018-10-10 DIAGNOSIS — Z794 Long term (current) use of insulin: Secondary | ICD-10-CM

## 2018-10-10 DIAGNOSIS — E7800 Pure hypercholesterolemia, unspecified: Secondary | ICD-10-CM | POA: Diagnosis present

## 2018-10-10 DIAGNOSIS — K567 Ileus, unspecified: Secondary | ICD-10-CM

## 2018-10-10 DIAGNOSIS — E860 Dehydration: Secondary | ICD-10-CM | POA: Diagnosis present

## 2018-10-10 DIAGNOSIS — K210 Gastro-esophageal reflux disease with esophagitis: Secondary | ICD-10-CM | POA: Diagnosis present

## 2018-10-10 DIAGNOSIS — A4151 Sepsis due to Escherichia coli [E. coli]: Secondary | ICD-10-CM | POA: Diagnosis present

## 2018-10-10 DIAGNOSIS — D61810 Antineoplastic chemotherapy induced pancytopenia: Secondary | ICD-10-CM | POA: Diagnosis present

## 2018-10-10 DIAGNOSIS — Z79899 Other long term (current) drug therapy: Secondary | ICD-10-CM | POA: Diagnosis not present

## 2018-10-10 DIAGNOSIS — Z95828 Presence of other vascular implants and grafts: Secondary | ICD-10-CM | POA: Diagnosis not present

## 2018-10-10 DIAGNOSIS — R1310 Dysphagia, unspecified: Secondary | ICD-10-CM | POA: Diagnosis not present

## 2018-10-10 DIAGNOSIS — I10 Essential (primary) hypertension: Secondary | ICD-10-CM | POA: Diagnosis present

## 2018-10-10 DIAGNOSIS — Z6826 Body mass index (BMI) 26.0-26.9, adult: Secondary | ICD-10-CM | POA: Diagnosis not present

## 2018-10-10 DIAGNOSIS — Z9221 Personal history of antineoplastic chemotherapy: Secondary | ICD-10-CM | POA: Diagnosis not present

## 2018-10-10 DIAGNOSIS — R531 Weakness: Secondary | ICD-10-CM

## 2018-10-10 DIAGNOSIS — D6959 Other secondary thrombocytopenia: Secondary | ICD-10-CM | POA: Diagnosis present

## 2018-10-10 DIAGNOSIS — Z20828 Contact with and (suspected) exposure to other viral communicable diseases: Secondary | ICD-10-CM | POA: Diagnosis present

## 2018-10-10 DIAGNOSIS — R5383 Other fatigue: Secondary | ICD-10-CM | POA: Diagnosis not present

## 2018-10-10 DIAGNOSIS — N179 Acute kidney failure, unspecified: Secondary | ICD-10-CM | POA: Diagnosis present

## 2018-10-10 DIAGNOSIS — A419 Sepsis, unspecified organism: Secondary | ICD-10-CM | POA: Diagnosis present

## 2018-10-10 DIAGNOSIS — T451X5A Adverse effect of antineoplastic and immunosuppressive drugs, initial encounter: Secondary | ICD-10-CM | POA: Diagnosis present

## 2018-10-10 DIAGNOSIS — D500 Iron deficiency anemia secondary to blood loss (chronic): Secondary | ICD-10-CM | POA: Diagnosis present

## 2018-10-10 DIAGNOSIS — D61818 Other pancytopenia: Secondary | ICD-10-CM | POA: Diagnosis not present

## 2018-10-10 DIAGNOSIS — R5381 Other malaise: Secondary | ICD-10-CM | POA: Diagnosis not present

## 2018-10-10 DIAGNOSIS — C20 Malignant neoplasm of rectum: Secondary | ICD-10-CM

## 2018-10-10 DIAGNOSIS — R1013 Epigastric pain: Secondary | ICD-10-CM | POA: Diagnosis not present

## 2018-10-10 DIAGNOSIS — C19 Malignant neoplasm of rectosigmoid junction: Secondary | ICD-10-CM | POA: Diagnosis present

## 2018-10-10 DIAGNOSIS — E11649 Type 2 diabetes mellitus with hypoglycemia without coma: Secondary | ICD-10-CM | POA: Diagnosis not present

## 2018-10-10 DIAGNOSIS — E43 Unspecified severe protein-calorie malnutrition: Secondary | ICD-10-CM | POA: Diagnosis present

## 2018-10-10 DIAGNOSIS — R197 Diarrhea, unspecified: Secondary | ICD-10-CM | POA: Diagnosis not present

## 2018-10-10 DIAGNOSIS — N390 Urinary tract infection, site not specified: Secondary | ICD-10-CM | POA: Diagnosis present

## 2018-10-10 DIAGNOSIS — R1110 Vomiting, unspecified: Secondary | ICD-10-CM | POA: Diagnosis not present

## 2018-10-10 DIAGNOSIS — Z66 Do not resuscitate: Secondary | ICD-10-CM | POA: Diagnosis present

## 2018-10-10 DIAGNOSIS — R1084 Generalized abdominal pain: Secondary | ICD-10-CM

## 2018-10-10 DIAGNOSIS — D72829 Elevated white blood cell count, unspecified: Secondary | ICD-10-CM

## 2018-10-10 DIAGNOSIS — K219 Gastro-esophageal reflux disease without esophagitis: Secondary | ICD-10-CM | POA: Diagnosis not present

## 2018-10-10 LAB — URINALYSIS, COMPLETE (UACMP) WITH MICROSCOPIC
Bilirubin Urine: NEGATIVE
Glucose, UA: >=500 mg/dL — AB
Hgb urine dipstick: NEGATIVE
Ketones, ur: NEGATIVE mg/dL
Leukocytes,Ua: NEGATIVE
Nitrite: NEGATIVE
Protein, ur: 30 mg/dL — AB
Specific Gravity, Urine: 1.018 (ref 1.005–1.030)
pH: 5 (ref 5.0–8.0)

## 2018-10-10 LAB — LACTIC ACID, PLASMA: Lactic Acid, Venous: 2.6 mmol/L (ref 0.5–1.9)

## 2018-10-10 LAB — SARS CORONAVIRUS 2 BY RT PCR (HOSPITAL ORDER, PERFORMED IN ~~LOC~~ HOSPITAL LAB): SARS Coronavirus 2: NEGATIVE

## 2018-10-10 LAB — GLUCOSE, CAPILLARY
Glucose-Capillary: 53 mg/dL — ABNORMAL LOW (ref 70–99)
Glucose-Capillary: 61 mg/dL — ABNORMAL LOW (ref 70–99)
Glucose-Capillary: 72 mg/dL (ref 70–99)

## 2018-10-10 LAB — CBC
HCT: 34.5 % — ABNORMAL LOW (ref 39.0–52.0)
Hemoglobin: 11.6 g/dL — ABNORMAL LOW (ref 13.0–17.0)
MCH: 29.6 pg (ref 26.0–34.0)
MCHC: 33.6 g/dL (ref 30.0–36.0)
MCV: 88 fL (ref 80.0–100.0)
Platelets: 129 10*3/uL — ABNORMAL LOW (ref 150–400)
RBC: 3.92 MIL/uL — ABNORMAL LOW (ref 4.22–5.81)
RDW: 20.7 % — ABNORMAL HIGH (ref 11.5–15.5)
WBC: 21.7 10*3/uL — ABNORMAL HIGH (ref 4.0–10.5)
nRBC: 0 % (ref 0.0–0.2)

## 2018-10-10 LAB — COMPREHENSIVE METABOLIC PANEL
ALT: 13 U/L (ref 0–44)
AST: 27 U/L (ref 15–41)
Albumin: 3.7 g/dL (ref 3.5–5.0)
Alkaline Phosphatase: 177 U/L — ABNORMAL HIGH (ref 38–126)
Anion gap: 10 (ref 5–15)
BUN: 23 mg/dL (ref 8–23)
CO2: 19 mmol/L — ABNORMAL LOW (ref 22–32)
Calcium: 8.7 mg/dL — ABNORMAL LOW (ref 8.9–10.3)
Chloride: 105 mmol/L (ref 98–111)
Creatinine, Ser: 1.37 mg/dL — ABNORMAL HIGH (ref 0.61–1.24)
GFR calc Af Amer: 56 mL/min — ABNORMAL LOW (ref >60)
GFR calc non Af Amer: 48 mL/min — ABNORMAL LOW (ref >60)
Glucose, Bld: 293 mg/dL — ABNORMAL HIGH (ref 70–99)
Potassium: 4.5 mmol/L (ref 3.5–5.1)
Sodium: 134 mmol/L — ABNORMAL LOW (ref 135–145)
Total Bilirubin: 1.3 mg/dL — ABNORMAL HIGH (ref 0.3–1.2)
Total Protein: 6.6 g/dL (ref 6.5–8.1)

## 2018-10-10 LAB — LIPASE, BLOOD: Lipase: 21 U/L (ref 11–51)

## 2018-10-10 LAB — HEMOGLOBIN A1C
Hgb A1c MFr Bld: 8 % — ABNORMAL HIGH (ref 4.8–5.6)
Mean Plasma Glucose: 182.9 mg/dL

## 2018-10-10 MED ORDER — INSULIN ASPART 100 UNIT/ML ~~LOC~~ SOLN
0.0000 [IU] | Freq: Three times a day (TID) | SUBCUTANEOUS | Status: DC
  Administered 2018-10-11 – 2018-10-13 (×4): 2 [IU] via SUBCUTANEOUS
  Administered 2018-10-13: 3 [IU] via SUBCUTANEOUS
  Administered 2018-10-14 (×2): 1 [IU] via SUBCUTANEOUS
  Administered 2018-10-14 – 2018-10-15 (×2): 2 [IU] via SUBCUTANEOUS
  Administered 2018-10-15 – 2018-10-16 (×3): 1 [IU] via SUBCUTANEOUS
  Filled 2018-10-10 (×11): qty 1

## 2018-10-10 MED ORDER — EZETIMIBE 10 MG PO TABS
10.0000 mg | ORAL_TABLET | ORAL | Status: DC
  Administered 2018-10-11: 10:00:00 10 mg via ORAL
  Filled 2018-10-10 (×2): qty 1

## 2018-10-10 MED ORDER — SODIUM CHLORIDE 0.9 % IV SOLN
Freq: Once | INTRAVENOUS | Status: AC
  Administered 2018-10-10: via INTRAVENOUS

## 2018-10-10 MED ORDER — INSULIN ASPART PROT & ASPART (70-30 MIX) 100 UNIT/ML ~~LOC~~ SUSP
30.0000 [IU] | SUBCUTANEOUS | Status: DC
  Administered 2018-10-11: 30 [IU] via SUBCUTANEOUS
  Filled 2018-10-10: qty 10

## 2018-10-10 MED ORDER — BACLOFEN 10 MG PO TABS
5.0000 mg | ORAL_TABLET | Freq: Three times a day (TID) | ORAL | Status: DC
  Administered 2018-10-10 – 2018-10-11 (×4): 5 mg via ORAL
  Filled 2018-10-10 (×7): qty 0.5

## 2018-10-10 MED ORDER — ONDANSETRON HCL 4 MG PO TABS
4.0000 mg | ORAL_TABLET | Freq: Four times a day (QID) | ORAL | Status: DC | PRN
  Administered 2018-10-11 – 2018-10-15 (×3): 4 mg via ORAL
  Filled 2018-10-10: qty 1

## 2018-10-10 MED ORDER — HEPARIN SODIUM (PORCINE) 5000 UNIT/ML IJ SOLN
5000.0000 [IU] | Freq: Three times a day (TID) | INTRAMUSCULAR | Status: DC
  Administered 2018-10-10 – 2018-10-17 (×21): 5000 [IU] via SUBCUTANEOUS
  Filled 2018-10-10 (×20): qty 1

## 2018-10-10 MED ORDER — SODIUM CHLORIDE 0.9 % IV SOLN
1.0000 g | Freq: Once | INTRAVENOUS | Status: AC
  Administered 2018-10-10: 1 g via INTRAVENOUS
  Filled 2018-10-10: qty 1

## 2018-10-10 MED ORDER — TRAZODONE HCL 50 MG PO TABS
50.0000 mg | ORAL_TABLET | Freq: Every evening | ORAL | Status: DC | PRN
  Administered 2018-10-10: 50 mg via ORAL
  Filled 2018-10-10: qty 1

## 2018-10-10 MED ORDER — GLUCOSE 4 G PO CHEW
CHEWABLE_TABLET | ORAL | Status: AC
  Administered 2018-10-10: 4 g via ORAL
  Filled 2018-10-10: qty 1

## 2018-10-10 MED ORDER — IOHEXOL 300 MG/ML  SOLN
100.0000 mL | Freq: Once | INTRAMUSCULAR | Status: AC | PRN
  Administered 2018-10-10: 19:00:00 100 mL via INTRAVENOUS

## 2018-10-10 MED ORDER — PANTOPRAZOLE SODIUM 40 MG PO TBEC
40.0000 mg | DELAYED_RELEASE_TABLET | ORAL | Status: DC
  Filled 2018-10-10: qty 1

## 2018-10-10 MED ORDER — LIDOCAINE-PRILOCAINE 2.5-2.5 % EX CREA
TOPICAL_CREAM | Freq: Once | CUTANEOUS | Status: DC
  Filled 2018-10-10: qty 5

## 2018-10-10 MED ORDER — POLYETHYLENE GLYCOL 3350 17 G PO PACK: 17.0000 g | PACK | Freq: Every day | ORAL | Status: DC | PRN

## 2018-10-10 MED ORDER — SODIUM CHLORIDE 0.9 % IV BOLUS
1000.0000 mL | Freq: Once | INTRAVENOUS | Status: AC
  Administered 2018-10-10: 1000 mL via INTRAVENOUS

## 2018-10-10 MED ORDER — ONDANSETRON HCL 4 MG/2ML IJ SOLN
4.0000 mg | Freq: Once | INTRAMUSCULAR | Status: AC
  Administered 2018-10-10: 4 mg via INTRAVENOUS
  Filled 2018-10-10: qty 2

## 2018-10-10 MED ORDER — ACETAMINOPHEN 325 MG PO TABS: 650.0000 mg | ORAL_TABLET | Freq: Four times a day (QID) | ORAL | Status: DC | PRN

## 2018-10-10 MED ORDER — NIACIN ER (ANTIHYPERLIPIDEMIC) 500 MG PO TBCR
500.0000 mg | EXTENDED_RELEASE_TABLET | Freq: Every evening | ORAL | Status: DC
  Administered 2018-10-10: 500 mg via ORAL
  Filled 2018-10-10 (×2): qty 1

## 2018-10-10 MED ORDER — IOHEXOL 240 MG/ML SOLN
50.0000 mL | Freq: Once | INTRAMUSCULAR | Status: AC | PRN
  Administered 2018-10-10: 18:00:00 50 mL via ORAL

## 2018-10-10 MED ORDER — SODIUM CHLORIDE 0.9 % IV SOLN
1.0000 g | INTRAVENOUS | Status: AC
  Administered 2018-10-11 – 2018-10-16 (×6): 1 g via INTRAVENOUS
  Filled 2018-10-10: qty 1
  Filled 2018-10-10: qty 10
  Filled 2018-10-10 (×4): qty 1

## 2018-10-10 MED ORDER — ACETAMINOPHEN 650 MG RE SUPP: 650.0000 mg | Freq: Four times a day (QID) | RECTAL | Status: DC | PRN

## 2018-10-10 MED ORDER — ONDANSETRON HCL 4 MG/2ML IJ SOLN
4.0000 mg | Freq: Four times a day (QID) | INTRAMUSCULAR | Status: DC | PRN
  Administered 2018-10-12 – 2018-10-15 (×6): 4 mg via INTRAVENOUS
  Filled 2018-10-10 (×5): qty 2

## 2018-10-10 MED ORDER — ONDANSETRON HCL 4 MG PO TABS
8.0000 mg | ORAL_TABLET | Freq: Two times a day (BID) | ORAL | Status: DC | PRN
  Administered 2018-10-15: 09:00:00 8 mg via ORAL
  Filled 2018-10-10 (×2): qty 2

## 2018-10-10 MED ORDER — PROCHLORPERAZINE MALEATE 10 MG PO TABS
10.0000 mg | ORAL_TABLET | Freq: Four times a day (QID) | ORAL | Status: DC | PRN
  Filled 2018-10-10: qty 1

## 2018-10-10 MED ORDER — METOPROLOL SUCCINATE ER 50 MG PO TB24
50.0000 mg | ORAL_TABLET | ORAL | Status: DC
  Administered 2018-10-11: 50 mg via ORAL
  Filled 2018-10-10: qty 1

## 2018-10-10 MED ORDER — INSULIN ASPART 100 UNIT/ML ~~LOC~~ SOLN: 0.0000 [IU] | Freq: Every day | SUBCUTANEOUS | Status: DC

## 2018-10-10 MED ORDER — OXYCODONE HCL 5 MG PO TABS
5.0000 mg | ORAL_TABLET | Freq: Four times a day (QID) | ORAL | Status: DC | PRN
  Administered 2018-10-16: 17:00:00 5 mg via ORAL
  Filled 2018-10-10: qty 1

## 2018-10-10 MED ORDER — GLUCOSE 4 G PO CHEW
1.0000 | CHEWABLE_TABLET | Freq: Once | ORAL | Status: AC
  Administered 2018-10-10: 4 g via ORAL

## 2018-10-10 NOTE — ED Notes (Signed)
Attempt to call report, no nurse available.

## 2018-10-10 NOTE — ED Notes (Signed)
Silvestre Moment [phone] Pt's neighbor, will be the one to pick him up

## 2018-10-10 NOTE — ED Triage Notes (Signed)
Decreased appetite, nausea and mild abdominal pain X 3 days. Instructed to come to ER for dehydration per his PCP. Pt alert and oriented X4, active, cooperative, pt in NAD. RR even and unlabored, color WNL.

## 2018-10-10 NOTE — ED Notes (Signed)
ED TO INPATIENT HANDOFF REPORT  ED Nurse Name and Phone #: Ezana Hubbert [phone]   S Name/Age/Gender Willie [name] 78 y.o. male Room/Bed: ED12A/ED12A  Code Status   Code Status: Prior  Home/SNF/Other Home Patient oriented to: self, place, time and situation Is this baseline? Yes   Triage Complete: Triage complete  Chief Complaint dehydrated vomiting  Triage Note Decreased appetite, nausea and mild abdominal pain X 3 days. Instructed to come to ER for dehydration per his PCP. Pt alert and oriented X4, active, cooperative, pt in NAD. RR even and unlabored, color WNL.     Allergies No Known Allergies  Level of Care/Admitting Diagnosis ED Disposition    ED Disposition Condition [facility] Area: [place] [100120]  Level of Care: Med-Surg [16]  Covid Evaluation: Asymptomatic Screening Protocol (No Symptoms)  Diagnosis: Sepsis ([place]) [[id number]]  Admitting Physician: Odessa Fleming  Attending Physician: Odessa Fleming  Estimated length of stay: past midnight tomorrow  Certification:: I certify this patient will need inpatient services for at least 2 midnights  PT Class (Do Not Modify): Inpatient [101]  PT Acc Code (Do Not Modify): Private [1]       B Medical/Surgery History Past Medical History:  Diagnosis Date  . Anemia   . Cancer ([place])   . Coronary artery disease   . Diabetes mellitus without complication ([place])   . History of hiatal hernia   . Hypercholesterolemia   . Hypertension    Past Surgical History:  Procedure Laterality Date  . COLONOSCOPY WITH PROPOFOL N/A 07/15/2018   Procedure: COLONOSCOPY WITH PROPOFOL;  Surgeon: [name], [name], MD;  Location: [facility] ENDOSCOPY;  Service: Gastroenterology;  Laterality: N/A;  . ESOPHAGOGASTRODUODENOSCOPY (EGD) WITH PROPOFOL N/A 07/15/2018   Procedure: ESOPHAGOGASTRODUODENOSCOPY (EGD) WITH PROPOFOL;  Surgeon: [name], [name], MD;  Location: [facility] ENDOSCOPY;  Service:  Gastroenterology;  Laterality: N/A;  . PORTACATH PLACEMENT N/A 08/13/2018   Procedure: INSERTION PORT-A-CATH;  Surgeon: [name], MD;  Location: [facility] ORS;  Service: General;  Laterality: N/A;  . PROSTATE SURGERY       A IV Location/Drains/Wounds Patient Lines/Drains/Airways Status   Active Line/Drains/Airways    Name:   Placement date:   Placement time:   Site:   Days:   Implanted Port 08/13/18 Right Chest   08/13/18    0803    Chest   58   Peripheral IV 10/10/18 Right Hand   10/10/18    1500    Hand   less than 1   Peripheral IV 10/10/18 Left Antecubital   10/10/18    2013    Antecubital   less than 1   Airway   08/13/18    0703     58   Incision (Closed) 08/13/18 Chest   08/13/18    0759     58          Intake/Output Last 24 hours No intake or output data in the 24 hours ending 10/10/18 2036  Labs/Imaging Results for orders placed or performed during the hospital encounter of 10/10/18 (from the past 48 hour(s))  Lipase, blood     Status: None   Collection Time: 10/10/18  2:55 PM  Result Value Ref Range   Lipase 21 11 - 51 U/L    Comment: Performed at [facility], [address], [place], [place] [address]  Comprehensive metabolic panel     Status: Abnormal   Collection Time: 10/10/18  2:55 PM  Result  Value Ref Range   Sodium 134 (L) 135 - 145 mmol/L   Potassium 4.5 3.5 - 5.1 mmol/L    Comment: HEMOLYSIS AT THIS LEVEL MAY AFFECT RESULT   Chloride 105 98 - 111 mmol/L   CO2 19 (L) 22 - 32 mmol/L   Glucose, Bld 293 (H) 70 - 99 mg/dL   BUN 23 8 - 23 mg/dL   Creatinine, Ser 1.37 (H) 0.61 - 1.24 mg/dL   Calcium 8.7 (L) 8.9 - 10.3 mg/dL   Total Protein 6.6 6.5 - 8.1 g/dL   Albumin 3.7 3.5 - 5.0 g/dL   AST 27 15 - 41 U/L   ALT 13 0 - 44 U/L   Alkaline Phosphatase 177 (H) 38 - 126 U/L   Total Bilirubin 1.3 (H) 0.3 - 1.2 mg/dL   GFR calc non Af Amer 48 (L) >60 mL/min   GFR calc Af Amer 56 (L) >60 mL/min   Anion gap 10 5 - 15    Comment: Performed at  [facility], [address] [place], [place] [address]  CBC     Status: Abnormal   Collection Time: 10/10/18  2:55 PM  Result Value Ref Range   WBC 21.7 (H) 4.0 - 10.5 K/uL   RBC 3.92 (L) 4.22 - 5.81 MIL/uL   Hemoglobin 11.6 (L) 13.0 - 17.0 g/dL   HCT 34.5 (L) 39.0 - 52.0 %   MCV 88.0 80.0 - 100.0 fL   MCH 29.6 26.0 - 34.0 pg   MCHC 33.6 30.0 - 36.0 g/dL   RDW 20.7 (H) 11.5 - 15.5 %   Platelets 129 (L) 150 - 400 K/uL    Comment: Immature Platelet Fraction may be clinically indicated, consider ordering this additional test [id number]    nRBC 0.0 0.0 - 0.2 %    Comment: Performed at [facility], [address] [place], [place] [address]  Urinalysis, Complete w Microscopic     Status: Abnormal   Collection Time: 10/10/18  4:21 PM  Result Value Ref Range   Color, Urine YELLOW (A) YELLOW   APPearance CLEAR (A) CLEAR   Specific Gravity, Urine 1.018 1.005 - 1.030   pH 5.0 5.0 - 8.0   Glucose, UA >=500 (A) NEGATIVE mg/dL   Hgb urine dipstick NEGATIVE NEGATIVE   Bilirubin Urine NEGATIVE NEGATIVE   Ketones, ur NEGATIVE NEGATIVE mg/dL   Protein, ur 30 (A) NEGATIVE mg/dL   Nitrite NEGATIVE NEGATIVE   Leukocytes,Ua NEGATIVE NEGATIVE   WBC, UA 11-20 0 - 5 WBC/hpf   Bacteria, UA MANY (A) NONE SEEN   Squamous Epithelial / LPF 0-5 0 - 5   Mucus PRESENT     Comment: Performed at [facility], [address] [place], [place] [address]  SARS Coronavirus 2 [facility] order, Performed in [facility] hospital lab) Nasopharyngeal Nasopharyngeal Swab     Status: None   Collection Time: 10/10/18  4:52 PM   Specimen: Nasopharyngeal Swab  Result Value Ref Range   SARS Coronavirus 2 NEGATIVE NEGATIVE    Comment: (NOTE) If result is NEGATIVE SARS-CoV-2 target nucleic acids are NOT DETECTED. The SARS-CoV-2 RNA is generally detectable in upper and lower  respiratory specimens during the acute phase of infection. The lowest  concentration of SARS-CoV-2 viral copies  this assay can detect is 250  copies / mL. A negative result does not preclude SARS-CoV-2 infection  and should not be used as the sole basis for treatment or other  patient management decisions.  A  negative result may occur with  improper specimen collection / handling, submission of specimen other  than nasopharyngeal swab, presence of viral mutation(s) within the  areas targeted by this assay, and inadequate number of viral copies  (<250 copies / mL). A negative result must be combined with clinical  observations, patient history, and epidemiological information. If result is POSITIVE SARS-CoV-2 target nucleic acids are DETECTED. The SARS-CoV-2 RNA is generally detectable in upper and lower  respiratory specimens dur ing the acute phase of infection.  Positive  results are indicative of active infection with SARS-CoV-2.  Clinical  correlation with patient history and other diagnostic information is  necessary to determine patient infection status.  Positive results do  not rule out bacterial infection or co-infection with other viruses. If result is PRESUMPTIVE POSTIVE SARS-CoV-2 nucleic acids MAY BE PRESENT.   A presumptive positive result was obtained on the submitted specimen  and confirmed on repeat testing.  While 2019 novel coronavirus  (SARS-CoV-2) nucleic acids may be present in the submitted sample  additional confirmatory testing may be necessary for epidemiological  and / or clinical management purposes  to differentiate between  SARS-CoV-2 and other Sarbecovirus currently known to infect humans.  If clinically indicated additional testing with an alternate test  methodology [phone]) is advised. The SARS-CoV-2 RNA is generally  detectable in upper and lower respiratory sp ecimens during the acute  phase of infection. The expected result is Negative. Fact Sheet for Patients:  StrictlyIdeas.no Fact Sheet for Healthcare  Providers: BankingDealers.co.za This test is not yet approved or cleared by the [place] FDA and has been authorized for detection and/or diagnosis of SARS-CoV-2 by FDA under an Emergency Use Authorization (EUA).  This EUA will remain in effect (meaning this test can be used) for the duration of the COVID-19 declaration under Section 564(b)(1) of the Act, 21 U.S.C. section 360bbb-3(b)(1), unless the authorization is terminated or revoked sooner. Performed at [facility], [address] [place], [place] [address]   Lactic acid, plasma     Status: Abnormal   Collection Time: 10/10/18  7:04 PM  Result Value Ref Range   Lactic Acid, Venous 2.6 (HH) 0.5 - 1.9 mmol/L    Comment: CRITICAL RESULT CALLED TO, READ BACK BY AND VERIFIED WITH Shandale Malak @1943  10/10/18 MJU Performed at [facility], [address] [place], [place] [address]    Ct Abdomen Pelvis W Contrast  Result Date: 10/10/2018 CLINICAL DATA:  Decreased appetite, nausea. History of colon cancer. EXAM: CT ABDOMEN AND PELVIS WITH CONTRAST TECHNIQUE: Multidetector CT imaging of the abdomen and pelvis was performed using the standard protocol following bolus administration of intravenous contrast. CONTRAST:  154mL OMNIPAQUE IOHEXOL 300 MG/ML  SOLN COMPARISON:  07/23/2018 FINDINGS: Lower chest: Coronary artery calcifications. Heart is normal size. Lung bases clear. No effusions. Hepatobiliary: Diffuse low-density throughout the liver compatible with fatty infiltration. No focal abnormality. Gallbladder unremarkable. Pancreas: No focal abnormality or ductal dilatation. Spleen: No focal abnormality.  Normal size. Adrenals/Urinary Tract: Small cyst in the lower pole of the right kidney. No hydronephrosis. No suspicious renal or adrenal lesion. Urinary bladder decompressed. A portion of the left anterior bladder wall again extends into a left inguinal hernia, stable. Stomach/Bowel: Again noted is  area of wall thickening within the rectum and sigmoid colon compatible with patient's known colorectal cancer. Previously seen 2nd rounded mass within the sigmoid colon not as well visualized on today's study. I suspect1 a soft tissue density on image 50 within  the sigmoid colon corresponds to the previously seen mass, but is much smaller on today's study. Extensive colonic diverticulosis. Appendix is normal. Stomach and small bowel decompressed. Vascular/Lymphatic: Aortic atherosclerosis. No aneurysm. Again noted are small lymph nodes in the pelvis in the perirectal space, stable or slightly decreased in size. No new or enlarging lymph nodes. Reproductive: Post prostatectomy. Other: No free fluid or free air. Musculoskeletal: Degenerative changes in the lumbar spine. No aggressive osseous lesions. IMPRESSION: Again noted are areas of wall thickening in the recto sigmoid colon compatible with patient's known colorectal cancer. The previously seen 2nd rounded mass in the sigmoid colon not definitively seen or smaller on today's study when compared to prior study. Diffuse colonic diverticulosis. Small perirectal lymph nodes are stable or slightly decreased in size. Mild fatty infiltration of the liver. Electronically Signed   By: Rolm Baptise M.D.   On: 10/10/2018 19:04   Dg Chest Port 1 View  Result Date: 10/10/2018 CLINICAL DATA:  Decreased appetite, nausea and mild abdominal pain x 3 days. Instructed to come to ER for dehydration. Hx of HTN, diabetes, CAD, colon cancer, port a cath insertion 09/02/2018. Patient states that he has 2 chemotherapy treatments remaining for colon cancer and will then move on to radiation tx. EXAM: PORTABLE CHEST 1 VIEW COMPARISON:  08/13/2018 FINDINGS: Cardiac silhouette is normal in size. No mediastinal or hilar masses. No evidence of adenopathy. Lungs are clear. Right anterior chest wall internal jugular Port-A-Cath is stable. Skeletal structures are grossly intact. IMPRESSION: No  active disease. Electronically Signed   By: Lajean Manes M.D.   On: 10/10/2018 17:42    Pending Labs Unresulted Labs (From admission, onward)    Start     Ordered   10/10/18 2002  Hemoglobin A1c  Once,   STAT    Comments: To assess prior glycemic control    10/10/18 2001   10/10/18 1920  Culture, blood (routine x 2)  BLOOD CULTURE X 2,   STAT     10/10/18 1921   10/10/18 1920  Urine culture  Once,   STAT     10/10/18 1921   Signed and Held  CBC  (heparin)  Once,   R    Comments: Baseline for heparin therapy IF NOT ALREADY DRAWN.  Notify MD if PLT < 100 K.    Signed and Held   Signed and Held  Creatinine, serum  (heparin)  Once,   R    Comments: Baseline for heparin therapy IF NOT ALREADY DRAWN.    Signed and Held   Signed and Held  Basic metabolic panel  Tomorrow morning,   R     Signed and Held   Signed and Held  CBC  Tomorrow morning,   R     Signed and Held          Vitals/Pain Today's Vitals   10/10/18 1454 10/10/18 1559 10/10/18 1653 10/10/18 1729  BP:  (!) 149/79 128/84 129/65  Pulse:  72 72 69  Resp:  14 16 16   Temp:  98.6 F (37 C)    TempSrc:  Oral    SpO2:  100% 100% 100%  Weight: 70 kg     Height: 5\' 3"  (1.6 m)     PainSc: 2        Isolation Precautions No active isolations  Medications Medications  ceFEPIme (MAXIPIME) 1 g in sodium chloride 0.9 % 100 mL IVPB (1 g Intravenous New Bag/Given 10/10/18 2026)  insulin aspart (novoLOG) injection  0-9 Units (has no administration in time range)  insulin aspart (novoLOG) injection 0-5 Units (has no administration in time range)  sodium chloride 0.9 % bolus 1,000 mL (0 mLs Intravenous Stopped 10/10/18 1630)  ondansetron (ZOFRAN) injection 4 mg (4 mg Intravenous Given 10/10/18 1727)  iohexol (OMNIPAQUE) 240 MG/ML injection 50 mL (50 mLs Oral Contrast Given 10/10/18 1737)  iohexol (OMNIPAQUE) 300 MG/ML solution 100 mL (100 mLs Intravenous Contrast Given 10/10/18 1848)  sodium chloride 0.9 % bolus 1,000 mL (1,000 mLs  Intravenous New Bag/Given 10/10/18 2023)    Mobility walks Low fall risk   Focused Assessments general   R Recommendations: See Admitting Provider Note  Report given to:   Additional Notes:

## 2018-10-10 NOTE — ED Notes (Signed)
Pt updated on room assignment process. Pt verbalizes understanding.

## 2018-10-10 NOTE — Progress Notes (Signed)
Family Meeting Note  Advance Directive:yes  Today a meeting took place with the ER  Patient came in with sepsis due to UTI. He has history of diabetes hypertension and CAD. Recently diagnosed with colon cancer undergoing chemotherapy at the cancer center. Overall felt too weak found to have dehydration/renal failure and UTI. Code status address patient wishes to be DNR Time spent during discussion: 82 mins  Fritzi Mandes, MD

## 2018-10-10 NOTE — ED Notes (Signed)
Critical lactic of 2.6 called from lab. Dr. Cinda Quest notified.

## 2018-10-10 NOTE — ED Notes (Signed)
Called CT as patient indicated they finished what contrast they could  1819

## 2018-10-10 NOTE — H&P (Addendum)
Willie [name] NAME: Willie [name]    MR#:  [id number]  DATE OF BIRTH:  September 22, 1940  DATE OF ADMISSION:  10/10/2018  PRIMARY CARE PHYSICIAN: [name], MD   REQUESTING/REFERRING PHYSICIAN: Dr. Rip Harbour  CHIEF COMPLAINT:   I am just not feeling right. Poor PO intake and weakness HISTORY OF PRESENT ILLNESS:  Willie [name]  is a 78 y.o. male with a known history of colon cancer undergoing chemotherapy with last chemo past Wednesday, hypertension, coronary artery disease, diabetes insulin-dependent comes to the emergency room with generalized weakness, fatigue, malaise and nausea. Denies any abdominal pain or vomiting.  In the ER patient was found to have elevated white count of 21,000 with lactic acidosis and UTI. He is being admitted with sepsis due to UTI. He was initially tachycardic with elevated white count and lactic acid elevation. He was found to be dehydrated as well with creatinine of 1.37  Patient received IV fluids and IV cefepime in ER. PAST MEDICAL HISTORY:   Past Medical History:  Diagnosis Date  . Anemia   . Cancer ([place])   . Coronary artery disease   . Diabetes mellitus without complication ([place])   . History of hiatal hernia   . Hypercholesterolemia   . Hypertension     PAST SURGICAL HISTOIRY:   Past Surgical History:  Procedure Laterality Date  . COLONOSCOPY WITH PROPOFOL N/A 07/15/2018   Procedure: COLONOSCOPY WITH PROPOFOL;  Surgeon: [name], [name], MD;  Location: [facility] ENDOSCOPY;  Service: Gastroenterology;  Laterality: N/A;  . ESOPHAGOGASTRODUODENOSCOPY (EGD) WITH PROPOFOL N/A 07/15/2018   Procedure: ESOPHAGOGASTRODUODENOSCOPY (EGD) WITH PROPOFOL;  Surgeon: [name], [name], MD;  Location: [facility] ENDOSCOPY;  Service: Gastroenterology;  Laterality: N/A;  . PORTACATH PLACEMENT N/A 08/13/2018   Procedure: INSERTION PORT-A-CATH;  Surgeon: [name], MD;  Location: [facility] ORS;  Service: General;   Laterality: N/A;  . PROSTATE SURGERY      SOCIAL HISTORY:   Social History   Tobacco Use  . Smoking status: Never Smoker  . Smokeless tobacco: Never Used  Substance Use Topics  . Alcohol use: Not Currently    FAMILY HISTORY:  No family history on file.  DRUG ALLERGIES:  No Known Allergies  REVIEW OF SYSTEMS:  Review of Systems  Constitutional: Positive for malaise/fatigue and weight loss. Negative for chills, diaphoresis and fever.  HENT: Negative for congestion, ear pain, hearing loss, nosebleeds and sore throat.   Eyes: Negative for blurred vision, double vision, photophobia and pain.  Respiratory: Negative for hemoptysis, sputum production, wheezing and stridor.   Cardiovascular: Negative for orthopnea, claudication and leg swelling.  Gastrointestinal: Negative for abdominal pain and heartburn.  Genitourinary: Negative for dysuria and frequency.  Musculoskeletal: Negative for back pain, joint pain and neck pain.  Skin: Negative for rash.  Neurological: Positive for weakness. Negative for tingling, sensory change, speech change, focal weakness, seizures and headaches.  Endo/Heme/Allergies: Does not bruise/bleed easily.  Psychiatric/Behavioral: Negative for memory loss, substance abuse and suicidal ideas. The patient is not nervous/anxious.      MEDICATIONS AT HOME:   Prior to Admission medications   Medication Sig Start Date End Date Taking? Authorizing Provider  dexamethasone (DECADRON) 4 MG tablet Take 2 tablets (8 mg total) by mouth daily. Start the day after chemotherapy for 2 days. Take with food. 08/05/18  Yes Sindy Guadeloupe, MD  ezetimibe (ZETIA) 10 MG tablet Take 10 mg by mouth every morning.    Yes [provider]  HUMALOG MIX 75/25 KWIKPEN (75-25) 100 UNIT/ML Kwikpen Inject 30 Units into the skin every morning.  08/02/17  Yes [provider]  lisinopril (PRINIVIL,ZESTRIL) 5 MG tablet Take 5 mg by mouth every morning.  08/02/17  Yes [provider]  metoprolol succinate (TOPROL-XL) 50 MG 24 hr tablet Take 50 mg by mouth every morning.  10/03/17  Yes [provider]  niacin (NIASPAN) 500 MG CR tablet Take 500 mg by mouth every evening.  08/02/17  Yes [provider]  nystatin (MYCOSTATIN) 100000 UNIT/ML suspension TAKE 1 TEASPOONSFUL BY MOUTH 4 TIMES DAILY 09/14/18  Yes Sindy Guadeloupe, MD  ondansetron (ZOFRAN) 8 MG tablet Take 1 tablet (8 mg total) by mouth 2 (two) times daily as needed for refractory nausea / vomiting. Start on day 3 after chemotherapy. 08/05/18  Yes Sindy Guadeloupe, MD  oxyCODONE (OXY IR/ROXICODONE) 5 MG immediate release tablet Take 1 tablet (5 mg total) by mouth every 6 (six) hours as needed for severe pain. 08/17/18  Yes Sindy Guadeloupe, MD  pantoprazole (PROTONIX) 40 MG tablet Take 40 mg by mouth every morning.    Yes [provider]  traZODone (DESYREL) 50 MG tablet Take 50 mg by mouth at bedtime as needed for sleep.    Yes [provider]  Baclofen 5 MG TABS Take 5 mg by mouth 3 (three) times daily. 09/25/18   Sindy Guadeloupe, MD  lidocaine-prilocaine (EMLA) cream Apply to affected area once 08/05/18   Sindy Guadeloupe, MD  prochlorperazine (COMPAZINE) 10 MG tablet Take 1 tablet (10 mg total) by mouth every 6 (six) hours as needed (Nausea or vomiting). 08/05/18   Sindy Guadeloupe, MD      VITAL SIGNS:  Blood pressure 129/65, pulse 69, temperature 98.6 F (37 C), temperature source Oral, resp. rate 16, height 5\' 3"  (1.6 m), weight 70 kg, SpO2 100 %.  PHYSICAL EXAMINATION:  GENERAL:  78 y.o.-year-old patient lying in the bed with no acute distress. weak EYES: Pupils equal, round, reactive to light and accommodation. No scleral icterus. Extraocular muscles intact.  HEENT: Head atraumatic, normocephalic. Oropharynx and nasopharynx clear.  NECK:  Supple, no jugular venous distention. No thyroid enlargement, no tenderness.  LUNGS: Normal breath sounds bilaterally, no wheezing,  rales,rhonchi or crepitation. No use of accessory muscles of respiration.  CARDIOVASCULAR: S1, S2 normal. No murmurs, rubs, or gallops.  ABDOMEN: Soft, nontender, nondistended. Bowel sounds present. No organomegaly or mass.  EXTREMITIES: No pedal edema, cyanosis, or clubbing.  NEUROLOGIC: Cranial nerves II through XII are intact. Muscle strength 5/5 in all extremities. Sensation intact. Gait not checked.  PSYCHIATRIC: The patient is alert and oriented x 3.  SKIN: No obvious rash, lesion, or ulcer.   LABORATORY PANEL:   CBC Recent Labs  Lab 10/10/18 1455  WBC 21.7*  HGB 11.6*  HCT 34.5*  PLT 129*   ------------------------------------------------------------------------------------------------------------------  Chemistries  Recent Labs  Lab 10/10/18 1455  NA 134*  K 4.5  CL 105  CO2 19*  GLUCOSE 293*  BUN 23  CREATININE 1.37*  CALCIUM 8.7*  AST 27  ALT 13  ALKPHOS 177*  BILITOT 1.3*   ------------------------------------------------------------------------------------------------------------------  Cardiac Enzymes No results for input(s): TROPONINI in the last 168 hours. ------------------------------------------------------------------------------------------------------------------  RADIOLOGY:  Ct Abdomen Pelvis W Contrast  Result Date: 10/10/2018 CLINICAL DATA:  Decreased appetite, nausea. History of colon cancer. EXAM: CT ABDOMEN AND PELVIS WITH CONTRAST TECHNIQUE: Multidetector CT imaging of the abdomen and pelvis was performed  using the standard protocol following bolus administration of intravenous contrast. CONTRAST:  164mL OMNIPAQUE IOHEXOL 300 MG/ML  SOLN COMPARISON:  07/23/2018 FINDINGS: Lower chest: Coronary artery calcifications. Heart is normal size. Lung bases clear. No effusions. Hepatobiliary: Diffuse low-density throughout the liver compatible with fatty infiltration. No focal abnormality. Gallbladder unremarkable. Pancreas: No focal abnormality or  ductal dilatation. Spleen: No focal abnormality.  Normal size. Adrenals/Urinary Tract: Small cyst in the lower pole of the right kidney. No hydronephrosis. No suspicious renal or adrenal lesion. Urinary bladder decompressed. A portion of the left anterior bladder wall again extends into a left inguinal hernia, stable. Stomach/Bowel: Again noted is area of wall thickening within the rectum and sigmoid colon compatible with patient's known colorectal cancer. Previously seen 2nd rounded mass within the sigmoid colon not as well visualized on today's study. I suspect1 a soft tissue density on image 50 within the sigmoid colon corresponds to the previously seen mass, but is much smaller on today's study. Extensive colonic diverticulosis. Appendix is normal. Stomach and small bowel decompressed. Vascular/Lymphatic: Aortic atherosclerosis. No aneurysm. Again noted are small lymph nodes in the pelvis in the perirectal space, stable or slightly decreased in size. No new or enlarging lymph nodes. Reproductive: Post prostatectomy. Other: No free fluid or free air. Musculoskeletal: Degenerative changes in the lumbar spine. No aggressive osseous lesions. IMPRESSION: Again noted are areas of wall thickening in the recto sigmoid colon compatible with patient's known colorectal cancer. The previously seen 2nd rounded mass in the sigmoid colon not definitively seen or smaller on today's study when compared to prior study. Diffuse colonic diverticulosis. Small perirectal lymph nodes are stable or slightly decreased in size. Mild fatty infiltration of the liver. Electronically Signed   By: Rolm Baptise M.D.   On: 10/10/2018 19:04   Dg Chest Port 1 View  Result Date: 10/10/2018 CLINICAL DATA:  Decreased appetite, nausea and mild abdominal pain x 3 days. Instructed to come to ER for dehydration. Hx of HTN, diabetes, CAD, colon cancer, port a cath insertion 09/02/2018. Patient states that he has 2 chemotherapy treatments remaining for  colon cancer and will then move on to radiation tx. EXAM: PORTABLE CHEST 1 VIEW COMPARISON:  08/13/2018 FINDINGS: Cardiac silhouette is normal in size. No mediastinal or hilar masses. No evidence of adenopathy. Lungs are clear. Right anterior chest wall internal jugular Port-A-Cath is stable. Skeletal structures are grossly intact. IMPRESSION: No active disease. Electronically Signed   By: Lajean Manes M.D.   On: 10/10/2018 17:42    EKG:    IMPRESSION AND PLAN:   Michaelangelo Mittelman  is a 78 y.o. male with a known history of colon cancer undergoing chemotherapy with last chemo past Wednesday, hypertension, coronary artery disease, diabetes insulin-dependent comes to the emergency room with generalized weakness, fatigue, malaise and nausea. Denies any abdominal pain or vomiting.  1. Sepsis due to UTI -patient came in with generalized weakness, tachycardia leukocytosis and abnormal UA. Lactic acid elevated -IV Rocephin -blood culture urine culture follow-up  2. Leukocytosis due to number one  3. Acute renal failure secondary to number one -IV fluids -hold lisinopril -monitor input output and creatinine  4. History of colon cancer diagnosed recently in May 2020 -undergoing chemotherapy at the cancer center. Follows with Dr. Janese Banks  5. Chemotherapy induced thrombocytopenia -no active bleeding  6. DVT prophylaxis subcu heparin  No Family in the ER   All the records are reviewed and case discussed with ED provider.   CODE STATUS: DNR  TOTAL  TIME TAKING CARE OF THIS PATIENT: *50* minutes.    Fritzi Mandes M.D on 10/10/2018 at 7:59 PM  Between 7am to 6pm - Pager - [phone]  After 6pm go to [email or web address] - password [email or web address] [facility]  [facility]  Office  [phone]  CC: Primary care physician; [name], MD

## 2018-10-10 NOTE — ED Notes (Signed)
Report from offgoing RN.  

## 2018-10-10 NOTE — ED Provider Notes (Signed)
[facility] Emergency Department Provider Note   ____________________________________________   First MD Initiated Contact with Patient 10/10/18 1612     (approximate)  I have reviewed the triage vital signs and the nursing notes.   HISTORY  Chief Complaint Abdominal Pain and Nausea    HPI Willie [name] is a 78 y.o. male who is getting chemo for colon cancer.  He has been unable to keep anything down for last couple days in fact been unable to eat or drink anything for the last couple days.  He is having some nausea as well.  And vomiting.  He is feeling weak but not having a fever.  He is having a little bit of lower abdominal pain as well.        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   History of gastric ulcer 07/07/2018   Ventricular tachycardia ([place]) 10/31/2017   Benign prostatic hyperplasia 09/16/2017   Controlled type 2 diabetes mellitus without complication, with long-term current use of insulin ([place]) 09/16/2017   Coronary artery disease involving native heart without angina pectoris 09/16/2017   Essential hypertension 09/16/2017   Onychomycosis due to dermatophyte 05/28/2013    Past Surgical History:  Procedure Laterality Date   COLONOSCOPY WITH PROPOFOL N/A 07/15/2018   Procedure: COLONOSCOPY WITH PROPOFOL;  Surgeon: [name], [name], MD;  Location: [facility] ENDOSCOPY;  Service: Gastroenterology;  Laterality: N/A;   ESOPHAGOGASTRODUODENOSCOPY (EGD) WITH PROPOFOL N/A 07/15/2018   Procedure:  ESOPHAGOGASTRODUODENOSCOPY (EGD) WITH PROPOFOL;  Surgeon: [name], [name], MD;  Location: [facility] ENDOSCOPY;  Service: Gastroenterology;  Laterality: N/A;   PORTACATH PLACEMENT N/A 08/13/2018   Procedure: INSERTION PORT-A-CATH;  Surgeon: [name], MD;  Location: [facility] ORS;  Service: General;  Laterality: N/A;   PROSTATE SURGERY      Prior to Admission medications   Medication Sig Start Date End Date Taking? Authorizing Provider  Baclofen 5 MG TABS Take 5 mg by mouth 3 (three) times daily. 09/25/18   Sindy Guadeloupe, MD  dexamethasone (DECADRON) 4 MG tablet Take 2 tablets (8 mg total) by mouth daily. Start the day after chemotherapy for 2 days. Take with food. 08/05/18   Sindy Guadeloupe, MD  ezetimibe (ZETIA) 10 MG tablet Take 10 mg by mouth every morning.     [provider]  HUMALOG MIX 75/25 KWIKPEN (75-25) 100 UNIT/ML Kwikpen Inject 30 Units into the skin every morning.  08/02/17   [provider]  lidocaine-prilocaine (EMLA) cream Apply to affected area once 08/05/18   Sindy Guadeloupe, MD  lisinopril (PRINIVIL,ZESTRIL) 5 MG tablet Take 5 mg by mouth every morning.  08/02/17   [provider]  metoprolol succinate (TOPROL-XL) 50 MG 24 hr tablet Take 50 mg by mouth every morning.  10/03/17   [provider]  niacin (NIASPAN) 500 MG CR tablet Take 500 mg by mouth every evening.  08/02/17   [provider]  nystatin (MYCOSTATIN) 100000 UNIT/ML suspension TAKE 1 TEASPOONSFUL BY MOUTH 4 TIMES DAILY Patient not taking: Reported on 09/21/2018 09/14/18  Sindy Guadeloupe, MD  ondansetron (ZOFRAN) 8 MG tablet Take 1 tablet (8 mg total) by mouth 2 (two) times daily as needed for refractory nausea / vomiting. Start on day 3 after chemotherapy. Patient not taking: Reported on 10/05/2018 08/05/18   Sindy Guadeloupe, MD  oxyCODONE (OXY IR/ROXICODONE) 5 MG immediate release tablet Take 1 tablet (5 mg total) by mouth every 6 (six) hours as needed for severe pain. 08/17/18    Sindy Guadeloupe, MD  pantoprazole (PROTONIX) 40 MG tablet Take 40 mg by mouth every morning.     [provider]  prochlorperazine (COMPAZINE) 10 MG tablet Take 1 tablet (10 mg total) by mouth every 6 (six) hours as needed (Nausea or vomiting). 08/05/18   Sindy Guadeloupe, MD  traZODone (DESYREL) 50 MG tablet Take 50 mg by mouth at bedtime as needed for sleep.     [provider]    Allergies Patient has no known allergies.  No family history on file.  Social History Social History   Tobacco Use   Smoking status: Never Smoker   Smokeless tobacco: Never Used  Substance Use Topics   Alcohol use: Not Currently   Drug use: Never    Review of Systems  Constitutional: No fever/chills Eyes: No visual changes. ENT: No sore throat. Cardiovascular: Denies chest pain. Respiratory: Denies shortness of breath. Gastrointestinal:  abdominal pain.   nausea,  vomiting.  No diarrhea.  No constipation. Genitourinary: Negative for dysuria. Musculoskeletal: Negative for back pain. Skin: Negative for rash. Neurological: Negative for headaches, focal weakness   ____________________________________________   PHYSICAL EXAM:  VITAL SIGNS: ED Triage Vitals  Enc Vitals Group     BP 10/10/18 1453 133/67     Pulse Rate 10/10/18 1453 (!) 124     Resp 10/10/18 1453 20     Temp 10/10/18 1453 99.1 F (37.3 C)     Temp Source 10/10/18 1453 Oral     SpO2 10/10/18 1453 100 %     Weight 10/10/18 1454 154 lb 5.2 oz (70 kg)     Height 10/10/18 1454 5\' 3"  (1.6 m)     Head Circumference --      Peak Flow --      Pain Score 10/10/18 1454 2     Pain Loc --      Pain Edu? --      Excl. in [place]? --    Constitutional: Alert and oriented. Well appearing and in no acute distress. Eyes: Conjunctivae are normal.  Head: Atraumatic. Nose: No congestion/rhinnorhea. Mouth/Throat: Mucous membranes are moist.  Oropharynx non-erythematous. Neck: No stridor.   Cardiovascular: Rapid rate,  regular rhythm. Grossly normal heart sounds.  Good peripheral circulation. Respiratory: Normal respiratory effort.  No retractions. Lungs CTAB. Gastrointestinal: Soft and nontender. No distention. No abdominal bruits. No CVA tenderness. Musculoskeletal: No lower extremity tenderness nor edema.  Neurologic:  Normal speech and language. No gross focal neurologic deficits are appreciated.  Skin:  Skin is warm, dry and intact. No rash noted.   ____________________________________________   LABS (all labs ordered are listed, but only abnormal results are displayed)  Labs Reviewed  COMPREHENSIVE METABOLIC PANEL - Abnormal; Notable for the following components:      Result Value   Sodium 134 (*)    CO2 19 (*)    Glucose, Bld 293 (*)    Creatinine, Ser 1.37 (*)    Calcium 8.7 (*)    Alkaline Phosphatase 177 (*)    Total Bilirubin 1.3 (*)  GFR calc non Af Amer 48 (*)    GFR calc Af Amer 56 (*)    All other components within normal limits  CBC - Abnormal; Notable for the following components:   WBC 21.7 (*)    RBC 3.92 (*)    Hemoglobin 11.6 (*)    HCT 34.5 (*)    RDW 20.7 (*)    Platelets 129 (*)    All other components within normal limits  URINALYSIS, COMPLETE (UACMP) WITH MICROSCOPIC - Abnormal; Notable for the following components:   Color, Urine YELLOW (*)    APPearance CLEAR (*)    Glucose, UA >=500 (*)    Protein, ur 30 (*)    Bacteria, UA MANY (*)    All other components within normal limits  SARS CORONAVIRUS 2 (HOSPITAL ORDER, [place] [facility])  CULTURE, BLOOD (ROUTINE X 2)  CULTURE, BLOOD (ROUTINE X 2)  URINE CULTURE  LIPASE, BLOOD  LACTIC ACID, PLASMA   ____________________________________________  EKG   ____________________________________________  RADIOLOGY  ED MD interpretation:    Official radiology report(s): Ct Abdomen Pelvis W Contrast  Result Date: 10/10/2018 CLINICAL DATA:  Decreased appetite, nausea. History of colon  cancer. EXAM: CT ABDOMEN AND PELVIS WITH CONTRAST TECHNIQUE: Multidetector CT imaging of the abdomen and pelvis was performed using the standard protocol following bolus administration of intravenous contrast. CONTRAST:  162mL OMNIPAQUE IOHEXOL 300 MG/ML  SOLN COMPARISON:  07/23/2018 FINDINGS: Lower chest: Coronary artery calcifications. Heart is normal size. Lung bases clear. No effusions. Hepatobiliary: Diffuse low-density throughout the liver compatible with fatty infiltration. No focal abnormality. Gallbladder unremarkable. Pancreas: No focal abnormality or ductal dilatation. Spleen: No focal abnormality.  Normal size. Adrenals/Urinary Tract: Small cyst in the lower pole of the right kidney. No hydronephrosis. No suspicious renal or adrenal lesion. Urinary bladder decompressed. A portion of the left anterior bladder wall again extends into a left inguinal hernia, stable. Stomach/Bowel: Again noted is area of wall thickening within the rectum and sigmoid colon compatible with patient's known colorectal cancer. Previously seen 2nd rounded mass within the sigmoid colon not as well visualized on today's study. I suspect1 a soft tissue density on image 50 within the sigmoid colon corresponds to the previously seen mass, but is much smaller on today's study. Extensive colonic diverticulosis. Appendix is normal. Stomach and small bowel decompressed. Vascular/Lymphatic: Aortic atherosclerosis. No aneurysm. Again noted are small lymph nodes in the pelvis in the perirectal space, stable or slightly decreased in size. No new or enlarging lymph nodes. Reproductive: Post prostatectomy. Other: No free fluid or free air. Musculoskeletal: Degenerative changes in the lumbar spine. No aggressive osseous lesions. IMPRESSION: Again noted are areas of wall thickening in the recto sigmoid colon compatible with patient's known colorectal cancer. The previously seen 2nd rounded mass in the sigmoid colon not definitively seen or smaller  on today's study when compared to prior study. Diffuse colonic diverticulosis. Small perirectal lymph nodes are stable or slightly decreased in size. Mild fatty infiltration of the liver. Electronically Signed   By: Rolm Baptise M.D.   On: 10/10/2018 19:04   Dg Chest Port 1 View  Result Date: 10/10/2018 CLINICAL DATA:  Decreased appetite, nausea and mild abdominal pain x 3 days. Instructed to come to ER for dehydration. Hx of HTN, diabetes, CAD, colon cancer, port a cath insertion 09/02/2018. Patient states that he has 2 chemotherapy treatments remaining for colon cancer and will then move on to radiation tx. EXAM: PORTABLE CHEST 1 VIEW COMPARISON:  08/13/2018 FINDINGS: Cardiac silhouette is normal in size. No mediastinal or hilar masses. No evidence of adenopathy. Lungs are clear. Right anterior chest wall internal jugular Port-A-Cath is stable. Skeletal structures are grossly intact. IMPRESSION: No active disease. Electronically Signed   By: Lajean Manes M.D.   On: 10/10/2018 17:42    ____________________________________________   PROCEDURES  Procedure(s) performed (including Critical Care):  Procedures   ____________________________________________   INITIAL IMPRESSION / ASSESSMENT AND PLAN / ED COURSE MONTOYA BRANDEL was evaluated in Emergency Department on 10/10/2018 for the symptoms described in the history of present illness. He was evaluated in the context of the global COVID-19 pandemic, which necessitated consideration that the patient might be at risk for infection with the SARS-CoV-2 virus that causes COVID-19. Institutional protocols and algorithms that pertain to the evaluation of patients at risk for COVID-19 are in a state of rapid change based on information released by regulatory bodies including the CDC and federal and state organizations. These policies and algorithms were followed during the patient's care in the ED.  Patient is not eating.  He has some belly pain and little  UTI with a elevated white blood count of 21,000.  We will get him in the hospital for treatment and hydration.             ____________________________________________   FINAL CLINICAL IMPRESSION(S) / ED DIAGNOSES  Final diagnoses:  Generalized abdominal pain  Urinary tract infection without hematuria, site unspecified  Leukocytosis, unspecified type     ED Discharge Orders    None       Note:  This document was prepared using Dragon voice recognition software and may include unintentional dictation errors.    Nena Polio, MD 10/10/18 [phone]

## 2018-10-10 NOTE — Telephone Encounter (Signed)
Patient reports nausea for 2 days,  "in the process of throwing up". He takes antiemetics "off and on:. He took one Zofran this morning, and took comapzine around 11am, and then he took the Zofran again just now. Can not tolerate any food or drink. Also has diarrhea, feels "dehydrated"  Recommend patient to go to ER as his symptoms are not relieved with oral antiemetics and may get dehydrated. He voices understanding and will go to ER.

## 2018-10-11 LAB — BASIC METABOLIC PANEL
Anion gap: 5 (ref 5–15)
BUN: 14 mg/dL (ref 8–23)
CO2: 22 mmol/L (ref 22–32)
Calcium: 7.7 mg/dL — ABNORMAL LOW (ref 8.9–10.3)
Chloride: 110 mmol/L (ref 98–111)
Creatinine, Ser: 0.94 mg/dL (ref 0.61–1.24)
GFR calc Af Amer: >60 mL/min (ref >60)
GFR calc non Af Amer: >60 mL/min (ref >60)
Glucose, Bld: 168 mg/dL — ABNORMAL HIGH (ref 70–99)
Potassium: 3.6 mmol/L (ref 3.5–5.1)
Sodium: 137 mmol/L (ref 135–145)

## 2018-10-11 LAB — CBC
HCT: 29.5 % — ABNORMAL LOW (ref 39.0–52.0)
Hemoglobin: 9.7 g/dL — ABNORMAL LOW (ref 13.0–17.0)
MCH: 29.4 pg (ref 26.0–34.0)
MCHC: 32.9 g/dL (ref 30.0–36.0)
MCV: 89.4 fL (ref 80.0–100.0)
Platelets: 84 10*3/uL — ABNORMAL LOW (ref 150–400)
RBC: 3.3 MIL/uL — ABNORMAL LOW (ref 4.22–5.81)
RDW: 20.5 % — ABNORMAL HIGH (ref 11.5–15.5)
WBC: 6.3 10*3/uL (ref 4.0–10.5)
nRBC: 0 % (ref 0.0–0.2)

## 2018-10-11 LAB — GLUCOSE, CAPILLARY
Glucose-Capillary: 168 mg/dL — ABNORMAL HIGH (ref 70–99)
Glucose-Capillary: 164 mg/dL — ABNORMAL HIGH (ref 70–99)
Glucose-Capillary: 45 mg/dL — ABNORMAL LOW (ref 70–99)
Glucose-Capillary: 76 mg/dL (ref 70–99)
Glucose-Capillary: 53 mg/dL — ABNORMAL LOW (ref 70–99)
Glucose-Capillary: 62 mg/dL — ABNORMAL LOW (ref 70–99)
Glucose-Capillary: 58 mg/dL — ABNORMAL LOW (ref 70–99)
Glucose-Capillary: 65 mg/dL — ABNORMAL LOW (ref 70–99)
Glucose-Capillary: 90 mg/dL (ref 70–99)

## 2018-10-11 MED ORDER — SODIUM CHLORIDE 0.9 % IV SOLN
INTRAVENOUS | Status: DC
  Administered 2018-10-11 – 2018-10-12 (×3): via INTRAVENOUS

## 2018-10-11 MED ORDER — GLUCOSE-VITAMIN C 4-6 GM-MG PO CHEW
1.0000 | CHEWABLE_TABLET | Freq: Once | ORAL | Status: AC
  Administered 2018-10-11: 20:00:00 1 via ORAL
  Filled 2018-10-11: qty 1

## 2018-10-11 MED ORDER — NYSTATIN 100000 UNIT/ML MT SUSP
5.0000 mL | Freq: Four times a day (QID) | OROMUCOSAL | Status: DC
  Administered 2018-10-11 – 2018-10-17 (×24): 500000 [IU] via ORAL
  Filled 2018-10-11 (×22): qty 5

## 2018-10-11 MED ORDER — PANTOPRAZOLE SODIUM 40 MG IV SOLR
40.0000 mg | Freq: Two times a day (BID) | INTRAVENOUS | Status: DC
  Administered 2018-10-11 – 2018-10-16 (×11): 40 mg via INTRAVENOUS
  Filled 2018-10-11 (×11): qty 40

## 2018-10-11 MED ORDER — FLUCONAZOLE 100MG IVPB
100.0000 mg | INTRAVENOUS | Status: DC
  Administered 2018-10-11 – 2018-10-12 (×2): 100 mg via INTRAVENOUS
  Filled 2018-10-11 (×3): qty 50

## 2018-10-11 NOTE — Progress Notes (Addendum)
Patient given 1 glucose tab due to FSBS 62 after 16oz of juice, a [place] sandwich, and 3 gram crackers 3hrs prior when FSBS was 45. Pt is asymptomatic, will continue to monitor.   Pt FSBS at 2141 was 65, patients dinner tray was then delivered from dining. After eating 50% of dinner current FSBS is now 90. Will continue to monitor.

## 2018-10-11 NOTE — Progress Notes (Addendum)
Pt FSBS at 2230 was 61, patient given 8oz soda, Patient unable to finish the soda, recheck showed FSBS decreased to 53. 1 glucose tab given instead of gel per patient request. Patient FSBS increased to 72. Pt A&Ox4, denied symptoms of hypoglycemia but states he took his insulin this morning but has not eaten anything all day. Will continue to monitor.

## 2018-10-11 NOTE — Progress Notes (Signed)
Patient ID: Willie [name], male   DOB: [birth year], 78 y.o.   MRN: [id number]  [facility] PROGRESS NOTE  GREGOR DERSHEM WUJ:[id number] DOB: [birth year] DOA: 10/10/2018 PCP: [name], MD  HPI/Subjective: Patient states he feels okay.  He states that he is having some trouble swallowing solid foods.  This is been going on for the last 3 to 4 days.  He states that this happens every time he does get some chemotherapy.  He states that every time he gets chemo he feels some nauseousness and that his throat is tighter.  Patient requesting a soft diet.  Last night had some acid reflux.  Objective: Vitals:   10/10/18 2124 10/11/18 0437  BP: (!) 144/86 140/68  Pulse: 70 72  Resp: 18 16  Temp: 98.5 F (36.9 C) 98.3 F (36.8 C)  SpO2: 100% 100%    Intake/Output Summary (Last 24 hours) at 10/11/2018 1329 Last data filed at 10/11/2018 1012 Gross per 24 hour  Intake 240 ml  Output -  Net 240 ml   Filed Weights   10/10/18 1454 10/10/18 2124  Weight: 70 kg 68.6 kg    ROS: Review of Systems  Constitutional: Negative for chills and fever.  Eyes: Negative for blurred vision.  Respiratory: Negative for cough and shortness of breath.   Cardiovascular: Negative for chest pain.  Gastrointestinal: Positive for abdominal pain and nausea. Negative for constipation, diarrhea and vomiting.  Genitourinary: Negative for dysuria.  Musculoskeletal: Negative for joint pain.  Neurological: Negative for dizziness and headaches.   Exam: Physical Exam  Constitutional: He is oriented to person, place, and time.  HENT:  Nose: No mucosal edema.  Mouth/Throat: No oropharyngeal exudate or posterior oropharyngeal edema.  Eyes: Pupils are equal, round, and reactive to light. Conjunctivae, EOM and lids are normal.  Neck: No JVD present. Carotid bruit is not present. No edema present. No thyroid mass and no thyromegaly present.  Cardiovascular: S1 normal and S2 normal. Exam reveals no gallop.  No  murmur heard. Pulses:      Dorsalis pedis pulses are 2+ on the right side and 2+ on the left side.  Respiratory: No respiratory distress. He has no wheezes. He has no rhonchi. He has no rales.  GI: Soft. Bowel sounds are normal. There is no abdominal tenderness.  Musculoskeletal:     Right ankle: He exhibits no swelling.     Left ankle: He exhibits no swelling.  Lymphadenopathy:    He has no cervical adenopathy.  Neurological: He is alert and oriented to person, place, and time. No cranial nerve deficit.  Skin: Skin is warm. No rash noted. Nails show no clubbing.  Psychiatric: He has a normal mood and affect.      Data Reviewed: Basic Metabolic Panel: Recent Labs  Lab 10/05/18 0805 10/10/18 1455 10/11/18 0542  NA 136 134* 137  K 3.6 4.5 3.6  CL 108 105 110  CO2 21* 19* 22  GLUCOSE 146* 293* 168*  BUN 9 23 14   CREATININE 1.19 1.37* 0.94  CALCIUM 8.6* 8.7* 7.7*   Liver Function Tests: Recent Labs  Lab 10/05/18 0805 10/10/18 1455  AST 16 27  ALT 13 13  ALKPHOS 83 177*  BILITOT 0.4 1.3*  PROT 6.2* 6.6  ALBUMIN 3.3* 3.7   Recent Labs  Lab 10/10/18 1455  LIPASE 21   CBC: Recent Labs  Lab 10/05/18 0805 10/10/18 1455 10/11/18 0542  WBC 9.3 21.7* 6.3  NEUTROABS 5.1  --   --  HGB 10.4* 11.6* 9.7*  HCT 31.2* 34.5* 29.5*  MCV 89.1 88.0 89.4  PLT 117* 129* 84*    CBG: Recent Labs  Lab 10/10/18 2230 10/10/18 2305 10/10/18 2343 10/11/18 0742 10/11/18 1208  GLUCAP 61* 53* 72 168* 164*    Recent Results (from the past 240 hour(s))  SARS Coronavirus 2 [facility] order, Performed in [facility] hospital lab) Nasopharyngeal Nasopharyngeal Swab     Status: None   Collection Time: 10/10/18  4:52 PM   Specimen: Nasopharyngeal Swab  Result Value Ref Range Status   SARS Coronavirus 2 NEGATIVE NEGATIVE Final    Comment: (NOTE) If result is NEGATIVE SARS-CoV-2 target nucleic acids are NOT DETECTED. The SARS-CoV-2 RNA is generally detectable in upper and lower   respiratory specimens during the acute phase of infection. The lowest  concentration of SARS-CoV-2 viral copies this assay can detect is 250  copies / mL. A negative result does not preclude SARS-CoV-2 infection  and should not be used as the sole basis for treatment or other  patient management decisions.  A negative result may occur with  improper specimen collection / handling, submission of specimen other  than nasopharyngeal swab, presence of viral mutation(s) within the  areas targeted by this assay, and inadequate number of viral copies  (<250 copies / mL). A negative result must be combined with clinical  observations, patient history, and epidemiological information. If result is POSITIVE SARS-CoV-2 target nucleic acids are DETECTED. The SARS-CoV-2 RNA is generally detectable in upper and lower  respiratory specimens dur ing the acute phase of infection.  Positive  results are indicative of active infection with SARS-CoV-2.  Clinical  correlation with patient history and other diagnostic information is  necessary to determine patient infection status.  Positive results do  not rule out bacterial infection or co-infection with other viruses. If result is PRESUMPTIVE POSTIVE SARS-CoV-2 nucleic acids MAY BE PRESENT.   A presumptive positive result was obtained on the submitted specimen  and confirmed on repeat testing.  While 2019 novel coronavirus  (SARS-CoV-2) nucleic acids may be present in the submitted sample  additional confirmatory testing may be necessary for epidemiological  and / or clinical management purposes  to differentiate between  SARS-CoV-2 and other Sarbecovirus currently known to infect humans.  If clinically indicated additional testing with an alternate test  methodology [phone]) is advised. The SARS-CoV-2 RNA is generally  detectable in upper and lower respiratory sp ecimens during the acute  phase of infection. The expected result is Negative. Fact  Sheet for Patients:  StrictlyIdeas.no Fact Sheet for Healthcare Providers: BankingDealers.co.za This test is not yet approved or cleared by the [place] FDA and has been authorized for detection and/or diagnosis of SARS-CoV-2 by FDA under an Emergency Use Authorization (EUA).  This EUA will remain in effect (meaning this test can be used) for the duration of the COVID-19 declaration under Section 564(b)(1) of the Act, 21 U.S.C. section 360bbb-3(b)(1), unless the authorization is terminated or revoked sooner. Performed at [facility], [address] [place], [place] [address]   Culture, blood (routine x 2)     Status: None (Preliminary result)   Collection Time: 10/10/18  8:19 PM   Specimen: BLOOD  Result Value Ref Range Status   Specimen Description BLOOD RIGHT WRIST  Final   Special Requests   Final    BOTTLES DRAWN AEROBIC AND ANAEROBIC Blood Culture results may not be optimal due to an inadequate volume of blood received in culture  bottles   Culture   Final    NO GROWTH < 12 HOURS Performed at [facility], [address] [place], [place] [address]    Report Status PENDING  Incomplete  Culture, blood (routine x 2)     Status: None (Preliminary result)   Collection Time: 10/10/18  8:19 PM   Specimen: BLOOD  Result Value Ref Range Status   Specimen Description BLOOD LEFT ANTECUBITAL  Final   Special Requests   Final    BOTTLES DRAWN AEROBIC AND ANAEROBIC Blood Culture adequate volume   Culture   Final    NO GROWTH < 12 HOURS Performed at [facility], [address], [place], [place] [address]    Report Status PENDING  Incomplete     Studies: Ct Abdomen Pelvis W Contrast  Result Date: 10/10/2018 CLINICAL DATA:  Decreased appetite, nausea. History of colon cancer. EXAM: CT ABDOMEN AND PELVIS WITH CONTRAST TECHNIQUE: Multidetector CT imaging of the abdomen and pelvis was performed using the  standard protocol following bolus administration of intravenous contrast. CONTRAST:  127mL OMNIPAQUE IOHEXOL 300 MG/ML  SOLN COMPARISON:  07/23/2018 FINDINGS: Lower chest: Coronary artery calcifications. Heart is normal size. Lung bases clear. No effusions. Hepatobiliary: Diffuse low-density throughout the liver compatible with fatty infiltration. No focal abnormality. Gallbladder unremarkable. Pancreas: No focal abnormality or ductal dilatation. Spleen: No focal abnormality.  Normal size. Adrenals/Urinary Tract: Small cyst in the lower pole of the right kidney. No hydronephrosis. No suspicious renal or adrenal lesion. Urinary bladder decompressed. A portion of the left anterior bladder wall again extends into a left inguinal hernia, stable. Stomach/Bowel: Again noted is area of wall thickening within the rectum and sigmoid colon compatible with patient's known colorectal cancer. Previously seen 2nd rounded mass within the sigmoid colon not as well visualized on today's study. I suspect1 a soft tissue density on image 50 within the sigmoid colon corresponds to the previously seen mass, but is much smaller on today's study. Extensive colonic diverticulosis. Appendix is normal. Stomach and small bowel decompressed. Vascular/Lymphatic: Aortic atherosclerosis. No aneurysm. Again noted are small lymph nodes in the pelvis in the perirectal space, stable or slightly decreased in size. No new or enlarging lymph nodes. Reproductive: Post prostatectomy. Other: No free fluid or free air. Musculoskeletal: Degenerative changes in the lumbar spine. No aggressive osseous lesions. IMPRESSION: Again noted are areas of wall thickening in the recto sigmoid colon compatible with patient's known colorectal cancer. The previously seen 2nd rounded mass in the sigmoid colon not definitively seen or smaller on today's study when compared to prior study. Diffuse colonic diverticulosis. Small perirectal lymph nodes are stable or slightly  decreased in size. Mild fatty infiltration of the liver. Electronically Signed   By: Rolm Baptise M.D.   On: 10/10/2018 19:04   Dg Chest Port 1 View  Result Date: 10/10/2018 CLINICAL DATA:  Decreased appetite, nausea and mild abdominal pain x 3 days. Instructed to come to ER for dehydration. Hx of HTN, diabetes, CAD, colon cancer, port a cath insertion 09/02/2018. Patient states that he has 2 chemotherapy treatments remaining for colon cancer and will then move on to radiation tx. EXAM: PORTABLE CHEST 1 VIEW COMPARISON:  08/13/2018 FINDINGS: Cardiac silhouette is normal in size. No mediastinal or hilar masses. No evidence of adenopathy. Lungs are clear. Right anterior chest wall internal jugular Port-A-Cath is stable. Skeletal structures are grossly intact. IMPRESSION: No active disease. Electronically Signed   By: Lajean Manes M.D.   On: 10/10/2018 17:42  Scheduled Meds: . baclofen  5 mg Oral TID  . ezetimibe  10 mg Oral BH-q7a  . heparin  5,000 Units Subcutaneous Q8H  . insulin aspart  0-5 Units Subcutaneous QHS  . insulin aspart  0-9 Units Subcutaneous TID WC  . insulin aspart protamine- aspart  30 Units Subcutaneous BH-q7a  . lidocaine-prilocaine   Topical Once  . metoprolol succinate  50 mg Oral BH-q7a  . niacin  500 mg Oral QPM  . nystatin  5 mL Oral QID  . pantoprazole (PROTONIX) IV  40 mg Intravenous Q12H   Continuous Infusions: . sodium chloride 40 mL/hr at 10/11/18 1020  . cefTRIAXone (ROCEPHIN)  IV 1 g (10/11/18 0456)  . fluconazole (DIFLUCAN) IV 100 mg (10/11/18 1250)    Assessment/Plan:  1. Clinical sepsis with weakness, tachycardia leukocytosis and possible abnormal urine analysis.  Patient started on IV Rocephin.  Follow-up cultures.  Unclear if this is urinary infection or not.  Since patient also having trouble swallowing and was on nystatin swish and swallow for thrush I will add IV Diflucan just in case esophagitis. 2. Leukocytosis improved with IV fluids 3. Acute  kidney injury and dehydration improved with IV fluids 4. Difficulty with eating solid food after chemotherapy.  Will put on IV Protonix and IV Diflucan and monitor.  Will touch base with him tomorrow morning.  If this is no better I may get gastroenterology to see him for an endoscopy. 5. Colon cancer undergoing chemotherapy with Dr. Janese Banks.  Dose of chemotherapy may have to be changed because patient has symptoms after each chemo session. 6. Chemotherapy-induced thrombocytopenia  Code Status:     Code Status Orders  (From admission, onward)         Start     Ordered   10/10/18 2216  Do not attempt resuscitation (DNR)  Continuous    Question Answer Comment  In the event of cardiac or respiratory ARREST Do not call a "code blue"   In the event of cardiac or respiratory ARREST Do not perform Intubation, CPR, defibrillation or ACLS   In the event of cardiac or respiratory ARREST Use medication by any route, position, wound care, and other measures to relive pain and suffering. May use oxygen, suction and manual treatment of airway obstruction as needed for comfort.   Comments d/w pt in ER      10/10/18 2215        Code Status History    Date Active Date Inactive Code Status Order ID Comments User Context   10/31/2017 2305 11/01/2017 2102 Partial Code [id number]  Mayo, Pete Pelt, MD Inpatient   Advance Care Planning Activity    Advance Directive Documentation     Most Recent Value  Type of Advance Directive  Healthcare Power of Attorney, Living will, Out of facility DNR (pink MOST or yellow form)  Pre-existing out of facility DNR order (yellow form or pink MOST form)  Physician notified to receive inpatient order  "MOST" Form in Place?  -      Disposition Plan: Potential discharge tomorrow  Antibiotics:  Rocephin  Time spent: 28 minutes  [place]

## 2018-10-12 ENCOUNTER — Inpatient Hospital Stay: Payer: Medicare Other | Admitting: Anesthesiology

## 2018-10-12 ENCOUNTER — Encounter
Admission: EM | Disposition: A | Discharge status: Home Health | Payer: Self-pay | Source: Home / Self Care | Attending: Internal Medicine

## 2018-10-12 DIAGNOSIS — R1310 Dysphagia, unspecified: Secondary | ICD-10-CM

## 2018-10-12 DIAGNOSIS — R1110 Vomiting, unspecified: Secondary | ICD-10-CM

## 2018-10-12 DIAGNOSIS — R112 Nausea with vomiting, unspecified: Secondary | ICD-10-CM

## 2018-10-12 DIAGNOSIS — E43 Unspecified severe protein-calorie malnutrition: Secondary | ICD-10-CM | POA: Insufficient documentation

## 2018-10-12 HISTORY — PX: ESOPHAGOGASTRODUODENOSCOPY: SHX5428

## 2018-10-12 LAB — GLUCOSE, CAPILLARY
Glucose-Capillary: 200 mg/dL — ABNORMAL HIGH (ref 70–99)
Glucose-Capillary: 236 mg/dL — ABNORMAL HIGH (ref 70–99)
Glucose-Capillary: 198 mg/dL — ABNORMAL HIGH (ref 70–99)
Glucose-Capillary: 232 mg/dL — ABNORMAL HIGH (ref 70–99)
Glucose-Capillary: 272 mg/dL — ABNORMAL HIGH (ref 70–99)

## 2018-10-12 LAB — VITAMIN B12: Vitamin B-12: 2257 pg/mL — ABNORMAL HIGH (ref 180–914)

## 2018-10-12 LAB — IRON AND TIBC
Iron: 159 ug/dL (ref 45–182)
Saturation Ratios: 72 % — ABNORMAL HIGH (ref 17.9–39.5)
TIBC: 221 ug/dL — ABNORMAL LOW (ref 250–450)
UIBC: 62 ug/dL

## 2018-10-12 LAB — FOLATE: Folate: 33 ng/mL (ref >5.9)

## 2018-10-12 LAB — FERRITIN: Ferritin: 1199 ng/mL — ABNORMAL HIGH (ref 24–336)

## 2018-10-12 SURGERY — EGD (ESOPHAGOGASTRODUODENOSCOPY)
Anesthesia: General

## 2018-10-12 MED ORDER — LIDOCAINE HCL (CARDIAC) PF 100 MG/5ML IV SOSY
PREFILLED_SYRINGE | INTRAVENOUS | Status: DC | PRN
  Administered 2018-10-12: 50 mg via INTRAVENOUS

## 2018-10-12 MED ORDER — EZETIMIBE 10 MG PO TABS
10.0000 mg | ORAL_TABLET | Freq: Every day | ORAL | Status: DC
  Administered 2018-10-12 – 2018-10-17 (×5): 10 mg via ORAL
  Filled 2018-10-12 (×6): qty 1

## 2018-10-12 MED ORDER — PROPOFOL 10 MG/ML IV BOLUS
INTRAVENOUS | Status: AC
  Filled 2018-10-12: qty 20

## 2018-10-12 MED ORDER — PROPOFOL 10 MG/ML IV BOLUS
INTRAVENOUS | Status: DC | PRN
  Administered 2018-10-12: 10 mg via INTRAVENOUS
  Administered 2018-10-12: 90 mg via INTRAVENOUS

## 2018-10-12 MED ORDER — METOPROLOL SUCCINATE ER 50 MG PO TB24
50.0000 mg | ORAL_TABLET | Freq: Every day | ORAL | Status: DC
  Administered 2018-10-12 – 2018-10-17 (×6): 50 mg via ORAL
  Filled 2018-10-12 (×6): qty 1

## 2018-10-12 NOTE — Anesthesia Post-op Follow-up Note (Signed)
Anesthesia QCDR form completed.        

## 2018-10-12 NOTE — Progress Notes (Signed)
Initial Nutrition Assessment  DOCUMENTATION CODES:   Severe malnutrition in context of acute illness/injury  INTERVENTION:  Once diet advances provide Ensure Max Protein po BID, each supplement provides 150 kcal and 30 grams of protein. Patient prefers chocolate.  Discussed increased needs for calories and protein. Encouraged adequate intake of calories and protein from meals, snacks, and ONS.  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   Met with patient at bedside. He reports his appetite has been decreased the past 3 days. He reports that every time he receives chemo his appetite is decreased for 3-5 days. His first chemotherapy cycle was on 6/8, which aligns with his decreased appetite and weight loss. He is also currently having difficulty swallowing solids. He is NPO pending GI evaluation today. He reports that yesterday he was able to eat some softer foods such as mashed potatoes and soup. At home he drinks Boost Glucose Control 2 per day (190 kcal, 16 grams of protein per bottle). He is amenable to drinking ONS here to help meet calorie/protein needs once diet advanced.  Patient reports his UBW was 185 lbs. Did not see a weight that high in the chart. He was 81.2 kg on 08/13/2018. He is now 68.6 kg (151.24 lbs). He has lost  12.6 kg (15.5% body weight) over the past 2 months, which is significant for time frame.  Medications reviewed and include: Novolog 0-9 units TID, Novolog 0-5 units QHS, nystatin, pantoprazole, ceftriaxone, Diflucan.  Labs reviewed: CBG 53-236.  NUTRITION - FOCUSED PHYSICAL EXAM:    Most Recent Value  Orbital Region  Mild depletion  Upper Arm Region  Moderate depletion  Thoracic and Lumbar Region  Moderate depletion  Buccal Region  Mild depletion  Temple Region  Mild depletion  Clavicle Bone Region  Moderate depletion  Clavicle and Acromion Bone Region  Moderate depletion  Scapular Bone Region  Mild depletion  Dorsal Hand  Mild depletion  Patellar Region  Moderate depletion  Anterior Thigh Region  Moderate depletion  Posterior Calf Region  Severe depletion  Edema (RD Assessment)  None  Hair  Reviewed  Eyes  Reviewed  Mouth  Reviewed  Skin  Reviewed  Nails  Reviewed     Diet Order:   Diet Order            Diet NPO time specified Except for: Sips with Meds  Diet effective now             EDUCATION NEEDS:   Education needs have been addressed  Skin:  Skin Assessment: Reviewed RN Assessment  Last BM:  10/11/2018 per chart  Height:   Ht Readings from Last 1 Encounters:  10/10/18 '5\' 3"'  (1.6 m)   Weight:   Wt Readings from Last 1 Encounters:  10/10/18 68.6 kg   Ideal Body Weight:  56.4 kg  BMI:  Body mass index is  26.79 kg/m.  Estimated Nutritional Needs:   Kcal:  9971-8209  Protein:  90-100 grams  Fluid:  1.7-2 L/day  Willey Blade, MS, RD, LDN Office: [phone] Pager: [phone] After Hours/Weekend Pager: [phone]

## 2018-10-12 NOTE — Progress Notes (Signed)
Inpatient Diabetes Program Recommendations  AACE/ADA: New Consensus Statement on Inpatient Glycemic Control   Target Ranges:  Prepandial:   less than 140 mg/dL      Peak postprandial:   less than 180 mg/dL (1-2 hours)      Critically ill patients:  140 - 180 mg/dL   Results for Willie [name], Willie [name] (MRN [id number]) as of 10/12/2018 11:49  Ref. Range 10/11/2018 07:42 10/11/2018 12:08 10/11/2018 15:58 10/11/2018 16:29 10/11/2018 18:20 10/11/2018 18:48 10/11/2018 19:37 10/11/2018 20:41 10/11/2018 21:27 10/12/2018 07:44 10/12/2018 11:37  Glucose-Capillary Latest Ref Range: 70 - 99 mg/dL 168 (H)  Novolog 2 units 164 (H)  Novolog 2 units  70/30 30 units 45 (L) 76 53 (L) 62 (L) 58 (L) 65 (L) 90 200 (H) 236 (H)   Review of Glycemic Control  Diabetes history: DM2 Outpatient Diabetes medications: Humalog 75/25 30 units QAM Current orders for Inpatient glycemic control: Novolog 0-9 units TID with meals, Novolog 0-5 units QHS  Inpatient Diabetes Program Recommendations:   Insulin - Basal: Noted patient received 70/30 30 units at 12:47 pm on 10/11/18 and experienced several episodes of hypoglycemia on 10/11/18. Fasting glucose 200 mg/dl today and 236 mg/dl at 11:37 am today. If glucose continues to be consistently greater than 180 mg/dl with Novolog correction, please consider ordering Lantus 7 units Q24H.  Thanks, Barnie Alderman, RN, MSN, CDE Diabetes Coordinator Inpatient Diabetes Program [phone] (Team Pager from 8am to 5pm)

## 2018-10-12 NOTE — Anesthesia Postprocedure Evaluation (Signed)
Anesthesia Post Note  Patient: Willie [name]  Procedure(s) Performed: ESOPHAGOGASTRODUODENOSCOPY (EGD) (N/A )  Patient location during evaluation: Endoscopy Anesthesia Type: General Level of consciousness: awake and alert Pain management: pain level controlled Vital Signs Assessment: post-procedure vital signs reviewed and stable Respiratory status: spontaneous breathing and respiratory function stable Cardiovascular status: stable Anesthetic complications: no     Last Vitals:  Vitals:   10/12/18 1619 10/12/18 1627  BP: 112/60 (!) 144/55  Pulse:  (!) 57  Resp:  18  Temp:  36.9 C  SpO2:  100%    Last Pain:  Vitals:   10/12/18 1619  TempSrc:   PainSc: 0-No pain                 Willie [name]

## 2018-10-12 NOTE — Op Note (Signed)
[facility] Gastroenterology Patient Name: Willie [name] Procedure Date: 10/12/2018 3:41 PM MRN: [id number] Account #: [id number] Date of Birth: 03-09-1941 Admit Type: Outpatient Age: 78 Room: [facility] ENDO ROOM 2 Gender: Male Note Status: Finalized Procedure:            Upper GI endoscopy Indications:          Suspected gastro-esophageal reflux disease, Nausea with                        vomiting, Regurgitation Providers:            [name] MD, MD Medicines:            Monitored Anesthesia Care Complications:        No immediate complications. Estimated blood loss: None. Procedure:            Pre-Anesthesia Assessment:                       - Prior to the procedure, a History and Physical was                        performed, and patient medications and allergies were                        reviewed. The patient is competent. The risks and                        benefits of the procedure and the sedation options and                        risks were discussed with the patient. All questions                        were answered and informed consent was obtained.                        Patient identification and proposed procedure were                        verified by the physician, the nurse, the                        anesthesiologist, the anesthetist and the technician in                        the pre-procedure area in the procedure room in the                        endoscopy suite. Mental Status Examination: alert and                        oriented. Airway Examination: normal oropharyngeal                        airway and neck mobility. Respiratory Examination:                        clear to auscultation. CV Examination: normal.  Prophylactic Antibiotics: The patient does not require                        prophylactic antibiotics. Prior Anticoagulants: The                        patient has taken no previous anticoagulant or                         antiplatelet agents. ASA Grade Assessment: III - A                        patient with severe systemic disease. After reviewing                        the risks and benefits, the patient was deemed in                        satisfactory condition to undergo the procedure. The                        anesthesia plan was to use monitored anesthesia care                        (MAC). Immediately prior to administration of                        medications, the patient was re-assessed for adequacy                        to receive sedatives. The heart rate, respiratory rate,                        oxygen saturations, blood pressure, adequacy of                        pulmonary ventilation, and response to care were                        monitored throughout the procedure. The physical status                        of the patient was re-assessed after the procedure.                       After obtaining informed consent, the endoscope was                        passed under direct vision. Throughout the procedure,                        the patient's blood pressure, pulse, and oxygen                        saturations were monitored continuously. The Endoscope                        was introduced through the mouth, and advanced to the  second part of duodenum. The upper GI endoscopy was                        accomplished without difficulty. The patient tolerated                        the procedure fairly well. Findings:      The duodenal bulb and second portion of the duodenum were normal.      The entire examined stomach was normal.      The cardia and gastric fundus were normal on retroflexion.      LA Grade D (one or more mucosal breaks involving at least 75% of       esophageal circumference) esophagitis with no bleeding was found in the       lower third of the esophagus. Impression:           - Normal duodenal bulb and second portion of  the                        duodenum.                       - Normal stomach.                       - LA Grade D reflux, acute and erosive esophagitis.                       - No specimens collected. Recommendation:       - Return patient to hospital ward for ongoing care.                       - Follow an antireflux regimen.                       - Use Prilosec (omeprazole) 40 mg PO BID indefinitely.                       - Advance diet as tolerated today. Procedure Code(s):    --- Professional ---                       [phone], Esophagogastroduodenoscopy, flexible, transoral;                        diagnostic, including collection of specimen(s) by                        brushing or washing, when performed (separate procedure) Diagnosis Code(s):    --- Professional ---                       K21.0, Gastro-esophageal reflux disease with esophagitis                       K20.8, Other esophagitis                       R11.2, Nausea with vomiting, unspecified                       R11.10, Vomiting, unspecified CPT copyright 2019 American Medical Association. All rights reserved. The codes documented in  this report are preliminary and upon coder review may  be revised to meet current compliance requirements. Dr. Ulyess Mort [name] MD, MD 10/12/2018 3:57:10 PM This report has been signed electronically. Number of Addenda: 0 Note Initiated On: 10/12/2018 3:41 PM Estimated Blood Loss: Estimated blood loss: none.      [facility]

## 2018-10-12 NOTE — Anesthesia Preprocedure Evaluation (Signed)
Anesthesia Evaluation  Patient identified by MRN, date of birth, ID band Patient awake    Reviewed: Allergy & Precautions, NPO status , Patient's Chart, lab work & pertinent test results  History of Anesthesia Complications Negative for: history of anesthetic complications  Airway Mallampati: III       Dental   Pulmonary neg sleep apnea, neg COPD,           Cardiovascular hypertension, Pt. on medications and Pt. on home beta blockers + CAD and + Cardiac Stents  (-) CHF (-) dysrhythmias (-) Valvular Problems/Murmurs     Neuro/Psych neg Seizures    GI/Hepatic Neg liver ROS, neg GERD  ,  Endo/Other  diabetes, Type 2, Oral Hypoglycemic Agents  Renal/GU negative Renal ROS     Musculoskeletal   Abdominal   Peds  Hematology   Anesthesia Other Findings   Reproductive/Obstetrics                             Anesthesia Physical Anesthesia Plan  ASA: III and emergent  Anesthesia Plan: General   Post-op Pain Management:    Induction: Intravenous  PONV Risk Score and Plan: 1 and Midazolam and TIVA  Airway Management Planned: Nasal Cannula  Additional Equipment:   Intra-op Plan:   Post-operative Plan:   Informed Consent: I have reviewed the patients History and Physical, chart, labs and discussed the procedure including the risks, benefits and alternatives for the proposed anesthesia with the patient or authorized representative who has indicated his/her understanding and acceptance.       Plan Discussed with:   Anesthesia Plan Comments:         Anesthesia Quick Evaluation

## 2018-10-12 NOTE — Consult Note (Signed)
Willie Darby, MD [address]  [place]  [place], [place] [address]  Main: [phone]  Fax: [phone] Pager: [phone]   Consultation  Referring Provider:     No ref. provider found Primary Care Physician:  [name], MD Primary Gastroenterologist:  Dr. Alice Reichert         Reason for Consultation:     Regurgitation, vomiting, dysphagia  Date of Admission:  10/10/2018 Date of Consultation:  10/12/2018         HPI:   Willie [name] is a 78 y.o. 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  Patient presented to [facility] 2 days ago with generalized malaise, weakness, nausea, decreased p.o. intake.  He was found to have UTI and currently being treated for it.  His leukocytosis has resolved as of today.  However, overnight patient reports that he has was experiencing severe heartburn, regurgitation of food, also has difficulty swallowing which he notices it every time he receives chemotherapy and symptoms are worse during this cycle.  He started on IV Protonix and GI is consulted for further evaluation.  Patient is kept n.p.o.  He had gastric ulcer in the past, underwent EGD by Dr. Alice Reichert and healing was confirmed.  NSAIDs: None  Antiplts/Anticoagulants/Anti thrombotics: None  GI Procedures:  EGD and colonoscopy 07/15/2018 by Dr. Alice Reichert - Normal esophagus. - Normal stomach. - 2 cm hiatal hernia. - Normal examined duodenum. - No specimens collected.  - Two 4 to 7 mm polyps in the descending colon and in the transverse colon, removed with a cold snare. Resected and retrieved. - Likely malignant partially obstructing tumor in the mid sigmoid colon. Biopsied. Injected. - Malignant partially obstructing tumor in the recto-sigmoid colon. Biopsied. Injected. - Non-bleeding internal hemorrhoids. - The examination was otherwise normal.  DIAGNOSIS:  A. COLON POLYP, TRANSVERSE; COLD SNARE:  -  TUBULAR ADENOMA.  - NEGATIVE FOR HIGH-GRADE DYSPLASIA AND MALIGNANCY.   B. COLON POLYP, DESCENDING; COLD SNARE:  - TUBULAR ADENOMA.  - NEGATIVE FOR HIGH-GRADE DYSPLASIA AND MALIGNANCY.   C. COLON MASS, MID SIGMOID; COLD BIOPSY:  - HIGH-GRADE DYSPLASIA, AT LEAST, INVOLVING AN ADENOMATOUS LESION WITH  VILLOUS FEATURES.  - A MORE SERIOUS PROCESS IS NOT EXCLUDED.   D. COLON MASS, RECTOSIGMOID; COLD BIOPSY:  - INVASIVE COLORECTAL ADENOCARCINOMA, ULCERATED.    Past Medical History:  Diagnosis Date   Anemia    Cancer ([place])    Coronary artery disease    Diabetes mellitus without complication ([place])    History of hiatal hernia    Hypercholesterolemia    Hypertension     Past Surgical History:  Procedure Laterality Date   COLONOSCOPY WITH PROPOFOL N/A 07/15/2018   Procedure: COLONOSCOPY WITH PROPOFOL;  Surgeon: [name], [name], MD;  Location: [facility] ENDOSCOPY;  Service: Gastroenterology;  Laterality: N/A;   ESOPHAGOGASTRODUODENOSCOPY (EGD) WITH PROPOFOL N/A 07/15/2018   Procedure: ESOPHAGOGASTRODUODENOSCOPY (EGD) WITH PROPOFOL;  Surgeon: [name], [name], MD;  Location: [facility] ENDOSCOPY;  Service: Gastroenterology;  Laterality: N/A;   PORTACATH PLACEMENT N/A 08/13/2018   Procedure: INSERTION PORT-A-CATH;  Surgeon: [name], MD;  Location: [facility] ORS;  Service: General;  Laterality: N/A;   PROSTATE SURGERY      Current Facility-Administered Medications:    0.9 %  sodium chloride infusion, , Intravenous, Continuous, Wieting, Richard, MD, Last Rate: 40 mL/hr at 10/12/18 1529   [MAR Hold] acetaminophen (TYLENOL) tablet 650 mg, 650 mg, Oral, Q6H  PRN **OR** [MAR Hold] acetaminophen (TYLENOL) suppository 650 mg, 650 mg, Rectal, Q6H PRN, Fritzi Mandes, MD   [facility] Hold] cefTRIAXone (ROCEPHIN) 1 g in sodium chloride 0.9 % 100 mL IVPB, 1 g, Intravenous, Q24H, Fritzi Mandes, MD, Last Rate: 200 mL/hr at 10/12/18 0521, 1 g at 10/12/18 0521   [MAR Hold] ezetimibe (ZETIA) tablet 10 mg,  10 mg, Oral, Daily, Fritzi Mandes, MD, 10 mg at 10/12/18 0903   [MAR Hold] fluconazole (DIFLUCAN) IVPB 100 mg, 100 mg, Intravenous, Q24H, Wieting, Richard, MD, Last Rate: 50 mL/hr at 10/12/18 0916, 100 mg at 10/12/18 0916   [MAR Hold] heparin injection 5,000 Units, 5,000 Units, Subcutaneous, Q8H, Fritzi Mandes, MD, 5,000 Units at 10/12/18 1420   [MAR Hold] insulin aspart (novoLOG) injection 0-5 Units, 0-5 Units, Subcutaneous, QHS, Fritzi Mandes, MD   [facility] Hold] insulin aspart (novoLOG) injection 0-9 Units, 0-9 Units, Subcutaneous, TID WC, Fritzi Mandes, MD, 2 Units at 10/11/18 1247   [MAR Hold] lidocaine-prilocaine (EMLA) cream, , Topical, Once, Fritzi Mandes, MD   [facility] Hold] metoprolol succinate (TOPROL-XL) 24 hr tablet 50 mg, 50 mg, Oral, Daily, Fritzi Mandes, MD, 50 mg at 10/12/18 0904   [MAR Hold] nystatin (MYCOSTATIN) 100000 UNIT/ML suspension 500,000 Units, 5 mL, Oral, QID, Wieting, Richard, MD, 500,000 Units at 10/12/18 1420   [MAR Hold] ondansetron (ZOFRAN) tablet 4 mg, 4 mg, Oral, Q6H PRN, 4 mg at 10/11/18 0453 **OR** [MAR Hold] ondansetron (ZOFRAN) injection 4 mg, 4 mg, Intravenous, Q6H PRN, Fritzi Mandes, MD, 4 mg at 10/12/18 0517   [MAR Hold] ondansetron (ZOFRAN) tablet 8 mg, 8 mg, Oral, BID PRN, Fritzi Mandes, MD   [facility] Hold] oxyCODONE (Oxy IR/ROXICODONE) immediate release tablet 5 mg, 5 mg, Oral, Q6H PRN, Fritzi Mandes, MD   [facility] Hold] pantoprazole (PROTONIX) injection 40 mg, 40 mg, Intravenous, Q12H, Wieting, Richard, MD, 40 mg at 10/12/18 0904   [MAR Hold] polyethylene glycol (MIRALAX / GLYCOLAX) packet 17 g, 17 g, Oral, Daily PRN, Fritzi Mandes, MD   [facility] Hold] prochlorperazine (COMPAZINE) tablet 10 mg, 10 mg, Oral, Q6H PRN, Fritzi Mandes, MD   [MAR Hold] traZODone (DESYREL) tablet 50 mg, 50 mg, Oral, QHS PRN, Fritzi Mandes, MD, 50 mg at 10/10/18 2345    History reviewed. No pertinent family history.   Social History   Tobacco Use   Smoking status: Never Smoker   Smokeless tobacco:  Never Used  Substance Use Topics   Alcohol use: Not Currently   Drug use: Never    Allergies as of 10/10/2018   (No Known Allergies)    Review of Systems:    All systems reviewed and negative except where noted in HPI.   Physical Exam:  Vital signs in last 24 hours: Temp:  [96.5 F (35.8 C)-98.7 F (37.1 C)] 96.5 F (35.8 C) (08/03 1526) Pulse Rate:  [54-73] 63 (08/03 1526) Resp:  [16-18] 18 (08/03 1558) BP: (111-176)/(68-89) 111/86 (08/03 1558) SpO2:  [97 %-100 %] 97 % (08/03 1558) Last BM Date: 10/11/18(per pt) General:   Pleasant, cooperative in NAD Head:  Normocephalic and atraumatic. Eyes:   No icterus.   Conjunctiva pink. PERRLA. Ears:  Normal auditory acuity. Neck:  Supple; no masses or thyroidomegaly Lungs: Respirations even and unlabored. Lungs clear to auscultation bilaterally.   No wheezes, crackles, or rhonchi.  Heart:  Regular rate and rhythm;  Without murmur, clicks, rubs or gallops Abdomen:  Soft, nondistended, nontender. Normal bowel sounds. No appreciable masses or hepatomegaly.  No rebound or guarding.  Rectal:  Not  performed. Msk:  Symmetrical without gross deformities.  Strength generalized weakness Extremities:  Without edema, cyanosis or clubbing. Neurologic:  Alert and oriented x3;  grossly normal neurologically. Skin:  Intact without significant lesions or rashes. Psych:  Alert and cooperative. Normal affect.  LAB RESULTS: CBC Latest Ref Rng & Units 10/11/2018 10/10/2018 10/05/2018  WBC 4.0 - 10.5 K/uL 6.3 21.7(H) 9.3  Hemoglobin 13.0 - 17.0 g/dL 9.7(L) 11.6(L) 10.4(L)  Hematocrit 39.0 - 52.0 % 29.5(L) 34.5(L) 31.2(L)  Platelets 150 - 400 K/uL 84(L) 129(L) 117(L)    BMET BMP Latest Ref Rng & Units 10/11/2018 10/10/2018 10/05/2018  Glucose 70 - 99 mg/dL 168(H) 293(H) 146(H)  BUN 8 - 23 mg/dL '14 23 9  ' Creatinine 0.61 - 1.24 mg/dL 0.94 1.37(H) 1.19  Sodium 135 - 145 mmol/L 137 134(L) 136  Potassium 3.5 - 5.1 mmol/L 3.6 4.5 3.6  Chloride 98 - 111  mmol/L 110 105 108  CO2 22 - 32 mmol/L 22 19(L) 21(L)  Calcium 8.9 - 10.3 mg/dL 7.7(L) 8.7(L) 8.6(L)    LFT Hepatic Function Latest Ref Rng & Units 10/10/2018 10/05/2018 09/21/2018  Total Protein 6.5 - 8.1 g/dL 6.6 6.2(L) 6.7  Albumin 3.5 - 5.0 g/dL 3.7 3.3(L) 3.3(L)  AST 15 - 41 U/L '27 16 15  ' ALT 0 - 44 U/L '13 13 14  ' Alk Phosphatase 38 - 126 U/L 177(H) 83 89  Total Bilirubin 0.3 - 1.2 mg/dL 1.3(H) 0.4 0.4     STUDIES: Ct Abdomen Pelvis W Contrast  Result Date: 10/10/2018 CLINICAL DATA:  Decreased appetite, nausea. History of colon cancer. EXAM: CT ABDOMEN AND PELVIS WITH CONTRAST TECHNIQUE: Multidetector CT imaging of the abdomen and pelvis was performed using the standard protocol following bolus administration of intravenous contrast. CONTRAST:  131m OMNIPAQUE IOHEXOL 300 MG/ML  SOLN COMPARISON:  07/23/2018 FINDINGS: Lower chest: Coronary artery calcifications. Heart is normal size. Lung bases clear. No effusions. Hepatobiliary: Diffuse low-density throughout the liver compatible with fatty infiltration. No focal abnormality. Gallbladder unremarkable. Pancreas: No focal abnormality or ductal dilatation. Spleen: No focal abnormality.  Normal size. Adrenals/Urinary Tract: Small cyst in the lower pole of the right kidney. No hydronephrosis. No suspicious renal or adrenal lesion. Urinary bladder decompressed. A portion of the left anterior bladder wall again extends into a left inguinal hernia, stable. Stomach/Bowel: Again noted is area of wall thickening within the rectum and sigmoid colon compatible with patient's known colorectal cancer. Previously seen 2nd rounded mass within the sigmoid colon not as well visualized on today's study. I suspect1 a soft tissue density on image 50 within the sigmoid colon corresponds to the previously seen mass, but is much smaller on today's study. Extensive colonic diverticulosis. Appendix is normal. Stomach and small bowel decompressed. Vascular/Lymphatic: Aortic  atherosclerosis. No aneurysm. Again noted are small lymph nodes in the pelvis in the perirectal space, stable or slightly decreased in size. No new or enlarging lymph nodes. Reproductive: Post prostatectomy. Other: No free fluid or free air. Musculoskeletal: Degenerative changes in the lumbar spine. No aggressive osseous lesions. IMPRESSION: Again noted are areas of wall thickening in the recto sigmoid colon compatible with patient's known colorectal cancer. The previously seen 2nd rounded mass in the sigmoid colon not definitively seen or smaller on today's study when compared to prior study. Diffuse colonic diverticulosis. Small perirectal lymph nodes are stable or slightly decreased in size. Mild fatty infiltration of the liver. Electronically Signed   By: KRolm BaptiseM.D.   On: 10/10/2018 19:04   Dg Chest  Port 1 View  Result Date: 10/10/2018 CLINICAL DATA:  Decreased appetite, nausea and mild abdominal pain x 3 days. Instructed to come to ER for dehydration. Hx of HTN, diabetes, CAD, colon cancer, port a cath insertion 09/02/2018. Patient states that he has 2 chemotherapy treatments remaining for colon cancer and will then move on to radiation tx. EXAM: PORTABLE CHEST 1 VIEW COMPARISON:  08/13/2018 FINDINGS: Cardiac silhouette is normal in size. No mediastinal or hilar masses. No evidence of adenopathy. Lungs are clear. Right anterior chest wall internal jugular Port-A-Cath is stable. Skeletal structures are grossly intact. IMPRESSION: No active disease. Electronically Signed   By: Lajean Manes M.D.   On: 10/10/2018 17:42      Impression / Plan:   Willie [name] is a 78 y.o. male with stage III rectal adenocarcinoma on FOLFOX chemotherapy admitted with UTI, generalized malaise, weakness, GI consulted for regurgitation, heartburn, nausea and vomiting.  Differentials include it is esophagitis or Candida esophagitis or gastroparesis or peptic ulcer disease or secondary to UTI.  Given that he has been  having recurrent episodes every time he receives chemo and with history of gastric ulcer, I recommend upper endoscopy for further evaluation.  Has no evidence of H. pylori on previous EGD  Proceed with EGD today Continue IV Protonix N.p.o. Can also try sucralfate elixir 3-4 times daily  I have discussed alternative options, risks & benefits,  which include, but are not limited to, bleeding, infection, perforation,respiratory complication & drug reaction.  The patient agrees with this plan & written consent will be obtained.     Thank you for involving me in the care of this patient.      LOS: 2 days   Sherri Sear, MD  10/12/2018, 4:12 PM   Note: This dictation was prepared with Dragon dictation along with smaller phrase technology. Any transcriptional errors that result from this process are unintentional.

## 2018-10-12 NOTE — Progress Notes (Signed)
Patient ID: Willie [name], male   DOB: [birth year], 78 y.o.   MRN: [id number]  [facility] PROGRESS NOTE  Willie [name] CWC:[id number] DOB: 1941-01-29 DOA: 10/10/2018 PCP: Willie Hire, MD  HPI/Subjective: Patient states that he still feels nauseous this morning.  Did not feel great.  States he gets this way after chemotherapy.  He feels like that his throat closes up.  Objective: Vitals:   10/11/18 1935 10/12/18 0425  BP: (!) 176/68 (!) 165/89  Pulse: (!) 54 73  Resp: 16 16  Temp: 97.8 F (36.6 C) 98.7 F (37.1 C)  SpO2: 100% 100%    Filed Weights   10/10/18 1454 10/10/18 2124  Weight: 70 kg 68.6 kg    ROS: Review of Systems  Constitutional: Negative for chills and fever.  Eyes: Negative for blurred vision.  Respiratory: Negative for cough and shortness of breath.   Cardiovascular: Negative for chest pain.  Gastrointestinal: Positive for abdominal pain and nausea. Negative for constipation, diarrhea and vomiting.  Genitourinary: Negative for dysuria.  Musculoskeletal: Negative for joint pain.  Neurological: Negative for dizziness and headaches.   Exam: Physical Exam  Constitutional: He is oriented to person, place, and time.  HENT:  Nose: No mucosal edema.  Mouth/Throat: No oropharyngeal exudate or posterior oropharyngeal edema.  Eyes: Pupils are equal, round, and reactive to light. Conjunctivae, EOM and lids are normal.  Neck: No JVD present. Carotid bruit is not present. No edema present. No thyroid mass and no thyromegaly present.  Cardiovascular: S1 normal and S2 normal. Exam reveals no gallop.  No murmur heard. Pulses:      Dorsalis pedis pulses are 2+ on the right side and 2+ on the left side.  Respiratory: No respiratory distress. He has no wheezes. He has no rhonchi. He has no rales.  GI: Soft. Bowel sounds are normal. There is no abdominal tenderness.  Musculoskeletal:     Right ankle: He exhibits no swelling.     Left ankle: He exhibits no  swelling.  Lymphadenopathy:    He has no cervical adenopathy.  Neurological: He is alert and oriented to person, place, and time. No cranial nerve deficit.  Skin: Skin is warm. No rash noted. Nails show no clubbing.  Psychiatric: He has a normal mood and affect.      Data Reviewed: Basic Metabolic Panel: Recent Labs  Lab 10/10/18 1455 10/11/18 0542  NA 134* 137  K 4.5 3.6  CL 105 110  CO2 19* 22  GLUCOSE 293* 168*  BUN 23 14  CREATININE 1.37* 0.94  CALCIUM 8.7* 7.7*   Liver Function Tests: Recent Labs  Lab 10/10/18 1455  AST 27  ALT 13  ALKPHOS 177*  BILITOT 1.3*  PROT 6.6  ALBUMIN 3.7   Recent Labs  Lab 10/10/18 1455  LIPASE 21   CBC: Recent Labs  Lab 10/10/18 1455 10/11/18 0542  WBC 21.7* 6.3  HGB 11.6* 9.7*  HCT 34.5* 29.5*  MCV 88.0 89.4  PLT 129* 84*    CBG: Recent Labs  Lab 10/11/18 1937 10/11/18 2041 10/11/18 2127 10/12/18 0744 10/12/18 1137  GLUCAP 58* 65* 90 200* 236*    Recent Results (from the past 240 hour(s))  Urine culture     Status: Abnormal (Preliminary result)   Collection Time: 10/10/18  4:21 PM   Specimen: Urine, Random  Result Value Ref Range Status   Specimen Description   Final    URINE, RANDOM Performed at [facility], [address],  [place], [place] [address]    Special Requests   Final    NONE Performed at [facility], [address] [place], [place] [address]    Culture >=100,000 COLONIES/mL ESCHERICHIA COLI (A)  Final   Report Status PENDING  Incomplete  SARS Coronavirus 2 [facility] order, Performed in [facility] hospital lab) Nasopharyngeal Nasopharyngeal Swab     Status: None   Collection Time: 10/10/18  4:52 PM   Specimen: Nasopharyngeal Swab  Result Value Ref Range Status   SARS Coronavirus 2 NEGATIVE NEGATIVE Final    Comment: (NOTE) If result is NEGATIVE SARS-CoV-2 target nucleic acids are NOT DETECTED. The SARS-CoV-2 RNA is generally detectable in upper and lower   respiratory specimens during the acute phase of infection. The lowest  concentration of SARS-CoV-2 viral copies this assay can detect is 250  copies / mL. A negative result does not preclude SARS-CoV-2 infection  and should not be used as the sole basis for treatment or other  patient management decisions.  A negative result may occur with  improper specimen collection / handling, submission of specimen other  than nasopharyngeal swab, presence of viral mutation(s) within the  areas targeted by this assay, and inadequate number of viral copies  (<250 copies / mL). A negative result must be combined with clinical  observations, patient history, and epidemiological information. If result is POSITIVE SARS-CoV-2 target nucleic acids are DETECTED. The SARS-CoV-2 RNA is generally detectable in upper and lower  respiratory specimens dur ing the acute phase of infection.  Positive  results are indicative of active infection with SARS-CoV-2.  Clinical  correlation with patient history and other diagnostic information is  necessary to determine patient infection status.  Positive results do  not rule out bacterial infection or co-infection with other viruses. If result is PRESUMPTIVE POSTIVE SARS-CoV-2 nucleic acids MAY BE PRESENT.   A presumptive positive result was obtained on the submitted specimen  and confirmed on repeat testing.  While 2019 novel coronavirus  (SARS-CoV-2) nucleic acids may be present in the submitted sample  additional confirmatory testing may be necessary for epidemiological  and / or clinical management purposes  to differentiate between  SARS-CoV-2 and other Sarbecovirus currently known to infect humans.  If clinically indicated additional testing with an alternate test  methodology [phone]) is advised. The SARS-CoV-2 RNA is generally  detectable in upper and lower respiratory sp ecimens during the acute  phase of infection. The expected result is Negative. Fact  Sheet for Patients:  StrictlyIdeas.no Fact Sheet for Healthcare Providers: BankingDealers.co.za This test is not yet approved or cleared by the [place] FDA and has been authorized for detection and/or diagnosis of SARS-CoV-2 by FDA under an Emergency Use Authorization (EUA).  This EUA will remain in effect (meaning this test can be used) for the duration of the COVID-19 declaration under Section 564(b)(1) of the Act, 21 U.S.C. section 360bbb-3(b)(1), unless the authorization is terminated or revoked sooner. Performed at [facility], [address] [place], [place] [address]   Culture, blood (routine x 2)     Status: None (Preliminary result)   Collection Time: 10/10/18  8:19 PM   Specimen: BLOOD  Result Value Ref Range Status   Specimen Description BLOOD RIGHT WRIST  Final   Special Requests   Final    BOTTLES DRAWN AEROBIC AND ANAEROBIC Blood Culture results may not be optimal due to an inadequate volume of blood received in culture bottles   Culture   Final  NO GROWTH 2 DAYS Performed at [facility], [address] [place], [place] [address]    Report Status PENDING  Incomplete  Culture, blood (routine x 2)     Status: None (Preliminary result)   Collection Time: 10/10/18  8:19 PM   Specimen: BLOOD  Result Value Ref Range Status   Specimen Description BLOOD LEFT ANTECUBITAL  Final   Special Requests   Final    BOTTLES DRAWN AEROBIC AND ANAEROBIC Blood Culture adequate volume   Culture   Final    NO GROWTH 2 DAYS Performed at [facility], [address] [place], [place] [address]    Report Status PENDING  Incomplete     Studies: Ct Abdomen Pelvis W Contrast  Result Date: 10/10/2018 CLINICAL DATA:  Decreased appetite, nausea. History of colon cancer. EXAM: CT ABDOMEN AND PELVIS WITH CONTRAST TECHNIQUE: Multidetector CT imaging of the abdomen and pelvis was performed using the standard  protocol following bolus administration of intravenous contrast. CONTRAST:  134mL OMNIPAQUE IOHEXOL 300 MG/ML  SOLN COMPARISON:  07/23/2018 FINDINGS: Lower chest: Coronary artery calcifications. Heart is normal size. Lung bases clear. No effusions. Hepatobiliary: Diffuse low-density throughout the liver compatible with fatty infiltration. No focal abnormality. Gallbladder unremarkable. Pancreas: No focal abnormality or ductal dilatation. Spleen: No focal abnormality.  Normal size. Adrenals/Urinary Tract: Small cyst in the lower pole of the right kidney. No hydronephrosis. No suspicious renal or adrenal lesion. Urinary bladder decompressed. A portion of the left anterior bladder wall again extends into a left inguinal hernia, stable. Stomach/Bowel: Again noted is area of wall thickening within the rectum and sigmoid colon compatible with patient's known colorectal cancer. Previously seen 2nd rounded mass within the sigmoid colon not as well visualized on today's study. I suspect1 a soft tissue density on image 50 within the sigmoid colon corresponds to the previously seen mass, but is much smaller on today's study. Extensive colonic diverticulosis. Appendix is normal. Stomach and small bowel decompressed. Vascular/Lymphatic: Aortic atherosclerosis. No aneurysm. Again noted are small lymph nodes in the pelvis in the perirectal space, stable or slightly decreased in size. No new or enlarging lymph nodes. Reproductive: Post prostatectomy. Other: No free fluid or free air. Musculoskeletal: Degenerative changes in the lumbar spine. No aggressive osseous lesions. IMPRESSION: Again noted are areas of wall thickening in the recto sigmoid colon compatible with patient's known colorectal cancer. The previously seen 2nd rounded mass in the sigmoid colon not definitively seen or smaller on today's study when compared to prior study. Diffuse colonic diverticulosis. Small perirectal lymph nodes are stable or slightly decreased in  size. Mild fatty infiltration of the liver. Electronically Signed   By: Rolm Baptise M.D.   On: 10/10/2018 19:04   Dg Chest Port 1 View  Result Date: 10/10/2018 CLINICAL DATA:  Decreased appetite, nausea and mild abdominal pain x 3 days. Instructed to come to ER for dehydration. Hx of HTN, diabetes, CAD, colon cancer, port a cath insertion 09/02/2018. Patient states that he has 2 chemotherapy treatments remaining for colon cancer and will then move on to radiation tx. EXAM: PORTABLE CHEST 1 VIEW COMPARISON:  08/13/2018 FINDINGS: Cardiac silhouette is normal in size. No mediastinal or hilar masses. No evidence of adenopathy. Lungs are clear. Right anterior chest wall internal jugular Port-A-Cath is stable. Skeletal structures are grossly intact. IMPRESSION: No active disease. Electronically Signed   By: Lajean Manes M.D.   On: 10/10/2018 17:42    Scheduled Meds: . ezetimibe  10 mg Oral Daily  .  heparin  5,000 Units Subcutaneous Q8H  . insulin aspart  0-5 Units Subcutaneous QHS  . insulin aspart  0-9 Units Subcutaneous TID WC  . lidocaine-prilocaine   Topical Once  . metoprolol succinate  50 mg Oral Daily  . nystatin  5 mL Oral QID  . pantoprazole (PROTONIX) IV  40 mg Intravenous Q12H   Continuous Infusions: . sodium chloride 40 mL/hr at 10/11/18 1719  . cefTRIAXone (ROCEPHIN)  IV 1 g (10/12/18 0521)  . fluconazole (DIFLUCAN) IV 100 mg (10/12/18 0916)    Assessment/Plan:  1. Clinical sepsis with weakness, tachycardia leukocytosis and positive E. coli in the urine culture..  Patient started on IV Rocephin. 2. Persistent nausea and difficulty eating solid food especially after chemotherapy.  I placed on IV Protonix yesterday and IV Diflucan because he was on swish and swallow as outpatient.  GI consult for endoscopy.  Currently n.p.o. 3. Leukocytosis improved with IV fluids 4. Acute kidney injury and dehydration improved with IV fluids 5. Colon cancer undergoing chemotherapy with Dr. Janese Banks.   Dose of chemotherapy may have to be changed because patient has symptoms after each chemo session. 6. Chemotherapy-induced thrombocytopenia 7. Type 2 diabetes mellitus with hypoglycemia.  Holding medications at this time and just doing sliding scale up because he is n.p.o. all day today.  Need to see how much he eats prior to restarting meds.  Code Status:     Code Status Orders  (From admission, onward)         Start     Ordered   10/10/18 2216  Do not attempt resuscitation (DNR)  Continuous    Question Answer Comment  In the event of cardiac or respiratory ARREST Do not call a "code blue"   In the event of cardiac or respiratory ARREST Do not perform Intubation, CPR, defibrillation or ACLS   In the event of cardiac or respiratory ARREST Use medication by any route, position, wound care, and other measures to relive pain and suffering. May use oxygen, suction and manual treatment of airway obstruction as needed for comfort.   Comments d/w pt in ER      10/10/18 2215        Code Status History    Date Active Date Inactive Code Status Order ID Comments User Context   10/31/2017 2305 11/01/2017 2102 Partial Code [id number]  Mayo, Pete Pelt, MD Inpatient   Advance Care Planning Activity    Advance Directive Documentation     Most Recent Value  Type of Advance Directive  Healthcare Power of [place], Living will, Out of facility DNR (pink MOST or yellow form)  Pre-existing out of facility DNR order (yellow form or pink MOST form)  Physician notified to receive inpatient order  "MOST" Form in Place?  -      Disposition Plan: Must tolerate diet prior to disposition  Antibiotics:  Rocephin  Time spent: 29 minutes.  Case discussed with GI  Willie [name] [employer]

## 2018-10-12 NOTE — Transfer of Care (Signed)
Immediate Anesthesia Transfer of Care Note  Patient: GUIDO COMP  Procedure(s) Performed: ESOPHAGOGASTRODUODENOSCOPY (EGD) (N/A )  Patient Location: Endoscopy Unit  Anesthesia Type:General  Level of Consciousness: drowsy and patient cooperative  Airway & Oxygen Therapy: Patient Spontanous Breathing  Post-op Assessment: Report given to RN and Post -op Vital signs reviewed and stable  Post vital signs: Reviewed and stable  Last Vitals:  Vitals Value Taken Time  BP 111/56 10/12/18 1558  Temp    Pulse 68 10/12/18 1559  Resp 15 10/12/18 1559  SpO2 100 % 10/12/18 1559  Vitals shown include unvalidated device data.  Last Pain:  Vitals:   10/12/18 1526  TempSrc: Tympanic  PainSc:          Complications: No apparent anesthesia complications

## 2018-10-13 ENCOUNTER — Encounter: Payer: Self-pay | Admitting: Gastroenterology

## 2018-10-13 LAB — BASIC METABOLIC PANEL
Anion gap: 8 (ref 5–15)
BUN: 10 mg/dL (ref 8–23)
CO2: 18 mmol/L — ABNORMAL LOW (ref 22–32)
Calcium: 7.9 mg/dL — ABNORMAL LOW (ref 8.9–10.3)
Chloride: 110 mmol/L (ref 98–111)
Creatinine, Ser: 1.04 mg/dL (ref 0.61–1.24)
GFR calc Af Amer: >60 mL/min (ref >60)
GFR calc non Af Amer: >60 mL/min (ref >60)
Glucose, Bld: 197 mg/dL — ABNORMAL HIGH (ref 70–99)
Potassium: 3.4 mmol/L — ABNORMAL LOW (ref 3.5–5.1)
Sodium: 136 mmol/L (ref 135–145)

## 2018-10-13 LAB — URINE CULTURE: Culture: >=100000 — AB

## 2018-10-13 LAB — CBC
HCT: 30.6 % — ABNORMAL LOW (ref 39.0–52.0)
Hemoglobin: 10.4 g/dL — ABNORMAL LOW (ref 13.0–17.0)
MCH: 29.5 pg (ref 26.0–34.0)
MCHC: 34 g/dL (ref 30.0–36.0)
MCV: 86.7 fL (ref 80.0–100.0)
Platelets: 60 10*3/uL — ABNORMAL LOW (ref 150–400)
RBC: 3.53 MIL/uL — ABNORMAL LOW (ref 4.22–5.81)
RDW: 20 % — ABNORMAL HIGH (ref 11.5–15.5)
WBC: 1.8 10*3/uL — ABNORMAL LOW (ref 4.0–10.5)
nRBC: 0 % (ref 0.0–0.2)

## 2018-10-13 LAB — GLUCOSE, CAPILLARY
Glucose-Capillary: 173 mg/dL — ABNORMAL HIGH (ref 70–99)
Glucose-Capillary: 210 mg/dL — ABNORMAL HIGH (ref 70–99)
Glucose-Capillary: 185 mg/dL — ABNORMAL HIGH (ref 70–99)
Glucose-Capillary: 191 mg/dL — ABNORMAL HIGH (ref 70–99)

## 2018-10-13 MED ORDER — ENSURE MAX PROTEIN PO LIQD
11.0000 [oz_av] | Freq: Two times a day (BID) | ORAL | Status: DC
  Administered 2018-10-13 – 2018-10-17 (×7): 11 [oz_av] via ORAL
  Filled 2018-10-13: qty 330

## 2018-10-13 MED ORDER — TBO-FILGRASTIM 300 MCG/0.5ML ~~LOC~~ SOSY
300.0000 ug | PREFILLED_SYRINGE | Freq: Once | SUBCUTANEOUS | Status: AC
  Administered 2018-10-13: 15:00:00 300 ug via SUBCUTANEOUS
  Filled 2018-10-13: qty 0.5

## 2018-10-13 MED ORDER — POTASSIUM CHLORIDE IN NACL 20-0.9 MEQ/L-% IV SOLN
INTRAVENOUS | Status: DC
  Administered 2018-10-13 – 2018-10-14 (×2): via INTRAVENOUS
  Filled 2018-10-13 (×3): qty 1000

## 2018-10-13 MED ORDER — INSULIN GLARGINE 100 UNIT/ML ~~LOC~~ SOLN
7.0000 [IU] | Freq: Every day | SUBCUTANEOUS | Status: DC
  Administered 2018-10-13 – 2018-10-16 (×4): 7 [IU] via SUBCUTANEOUS
  Filled 2018-10-13 (×5): qty 0.07

## 2018-10-13 MED ORDER — PROMETHAZINE HCL 25 MG/ML IJ SOLN
12.5000 mg | Freq: Once | INTRAMUSCULAR | Status: AC
  Administered 2018-10-13: 18:00:00 12.5 mg via INTRAVENOUS
  Filled 2018-10-13: qty 1

## 2018-10-13 NOTE — TOC Initial Note (Signed)
Transition of Care [facility]) - Initial/Assessment Note    Patient Details  Name: Willie [name] MRN: [id number] Date of Birth: 08-03-1940  Transition [facility]) CM/SW Contact:    Shelbie Hutching, RN Phone Number: 10/13/2018, 11:51 AM  Clinical Narrative:                 Patient admitted with sepsis from UTI.  Patient has esophagitis and has been experiencing nausea and heartburn.  Patient is from home and lives alone.  Plan is for discharge tomorrow, patient would like home health to come out.  Patient offered choice of Medicare approved agencies.  Patient has no preference, referral given to [facility].  Patient's nephew will be arriving tomorrow and staying with the patient.  Patient does not know how long the nephew will be staying.  Patient is independent at baseline.  Patient is current with PCP.    Expected Discharge Plan: [place] Barriers to Discharge: Continued Medical Work up   Patient Goals and CMS Choice Patient states their goals for this hospitalization and ongoing recovery are:: Feel better and return home- no specific goal CMS Medicare.gov Compare Post Acute Care list provided to:: Patient Choice offered to / list presented to : Patient  Expected Discharge Plan and Services Expected Discharge Plan: [place] Choice: [place] arrangements for the past 2 months: Single Family Home                                      Prior Living Arrangements/Services Living arrangements for the past 2 months: Single Family Home Lives with:: Self Patient language and need for interpreter reviewed:: No Do you feel safe going back to the place where you live?: Yes      Need for Family Participation in Patient Care: Yes (Comment) Care giver support system in place?: Yes (comment)(has nephew and friends)   Criminal Activity/Legal Involvement Pertinent to Current Situation/Hospitalization: No - Comment  as needed  Activities of Daily Living Home Assistive Devices/Equipment: None ADL Screening (condition at time of admission) Patient's cognitive ability adequate to safely complete daily activities?: Yes Is the patient deaf or have difficulty hearing?: No Does the patient have difficulty seeing, even when wearing glasses/contacts?: No Does the patient have difficulty concentrating, remembering, or making decisions?: No Patient able to express need for assistance with ADLs?: Yes Does the patient have difficulty dressing or bathing?: No Independently performs ADLs?: Yes (appropriate for developmental age) Does the patient have difficulty walking or climbing stairs?: No Weakness of Legs: None Weakness of Arms/Hands: None  Permission Sought/Granted Permission sought to share information with : Case Manager, Other (comment) Permission granted to share information with : Yes, Verbal Permission Granted     Permission granted to share info w AGENCY: [place] of choice        Emotional Assessment Appearance:: Appears younger than stated age Attitude/Demeanor/Rapport: Engaged Affect (typically observed): Accepting Orientation: : Oriented to Self, Oriented to Place, Oriented to  Time, Oriented to Situation Alcohol / Substance Use: Not Applicable Psych Involvement: No (comment)  Admission diagnosis:  Weakness [R53.1] Generalized abdominal pain [R10.84] Urinary tract infection without hematuria, site unspecified [N39.0] Leukocytosis, unspecified type [D72.829] Patient Active Problem List   Diagnosis Date Noted  . Protein-calorie malnutrition, severe 10/12/2018  . Sepsis ([place]) 10/10/2018  . Hypercholesteremia  08/17/2018  . Goals of care, counseling/discussion 08/05/2018  . 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[place]) 08/05/2018  . 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 History of gastric ulcer 07/07/2018  . Ventricular tachycardia ([place]) 10/31/2017  . Benign prostatic  hyperplasia 09/16/2017  . Controlled type 2 diabetes mellitus without complication, with long-term current use of insulin ([place]) 09/16/2017  . Coronary artery disease involving native heart without angina pectoris 09/16/2017  . Essential hypertension 09/16/2017  . Onychomycosis due to dermatophyte 05/28/2013   PCP:  [name], MD Pharmacy:   [place], [place] [address] Phone: [phone] Fax: [phone]     Social Determinants of Health (SDOH) Interventions    Readmission Risk Interventions No flowsheet data found.

## 2018-10-13 NOTE — Progress Notes (Signed)
Patient ID: Willie [name], male   DOB: 08-02-1940, 78 y.o.   MRN: [id number]  [facility] PROGRESS NOTE  ARIZONA NORDQUIST FGH:[id number] DOB: 08/05/1940 DOA: 10/10/2018 PCP: [name], MD  HPI/Subjective: Patient feels nauseous.  Still having a little abdominal pain.  Patient feels a little bit better but still not eating very much.  He is drinking his coffee.  Objective: Vitals:   10/13/18 0439 10/13/18 1225  BP: (!) 137/59 132/75  Pulse: 69 87  Resp: 17 (!) 24  Temp: 98.2 F (36.8 C) 97.6 F (36.4 C)  SpO2: 100% 100%    Filed Weights   10/10/18 1454 10/10/18 2124  Weight: 70 kg 68.6 kg    ROS: Review of Systems  Constitutional: Negative for chills and fever.  Eyes: Negative for blurred vision.  Respiratory: Negative for cough, shortness of breath and wheezing.   Cardiovascular: Negative for chest pain.  Gastrointestinal: Positive for abdominal pain and nausea. Negative for constipation, diarrhea and vomiting.  Genitourinary: Negative for dysuria.  Musculoskeletal: Negative for joint pain.  Neurological: Negative for dizziness and headaches.   Exam: Physical Exam  Constitutional: He is oriented to person, place, and time.  HENT:  Nose: No mucosal edema.  Mouth/Throat: No oropharyngeal exudate or posterior oropharyngeal edema.  Eyes: Pupils are equal, round, and reactive to light. Conjunctivae, EOM and lids are normal.  Neck: No JVD present. Carotid bruit is not present. No edema present. No thyroid mass and no thyromegaly present.  Cardiovascular: S1 normal and S2 normal. Exam reveals no gallop.  No murmur heard. Pulses:      Dorsalis pedis pulses are 2+ on the right side and 2+ on the left side.  Respiratory: No respiratory distress. He has no wheezes. He has no rhonchi. He has no rales.  GI: Soft. Bowel sounds are normal. There is no abdominal tenderness.  Musculoskeletal:     Right ankle: He exhibits no swelling.     Left ankle: He exhibits no  swelling.  Lymphadenopathy:    He has no cervical adenopathy.  Neurological: He is alert and oriented to person, place, and time. No cranial nerve deficit.  Skin: Skin is warm. No rash noted. Nails show no clubbing.  Psychiatric: He has a normal mood and affect.      Data Reviewed: Basic Metabolic Panel: Recent Labs  Lab 10/10/18 1455 10/11/18 0542 10/13/18 0510  NA 134* 137 136  K 4.5 3.6 3.4*  CL 105 110 110  CO2 19* 22 18*  GLUCOSE 293* 168* 197*  BUN 23 14 10   CREATININE 1.37* 0.94 1.04  CALCIUM 8.7* 7.7* 7.9*   Liver Function Tests: Recent Labs  Lab 10/10/18 1455  AST 27  ALT 13  ALKPHOS 177*  BILITOT 1.3*  PROT 6.6  ALBUMIN 3.7   Recent Labs  Lab 10/10/18 1455  LIPASE 21   CBC: Recent Labs  Lab 10/10/18 1455 10/11/18 0542 10/13/18 0510  WBC 21.7* 6.3 1.8*  HGB 11.6* 9.7* 10.4*  HCT 34.5* 29.5* 30.6*  MCV 88.0 89.4 86.7  PLT 129* 84* 60*    CBG: Recent Labs  Lab 10/12/18 1535 10/12/18 1750 10/12/18 2104 10/13/18 0749 10/13/18 1141  GLUCAP 198* 232* 272* 173* 210*    Recent Results (from the past 240 hour(s))  Urine culture     Status: Abnormal   Collection Time: 10/10/18  4:21 PM   Specimen: Urine, Random  Result Value Ref Range Status   Specimen Description   Final  URINE, RANDOM Performed at [facility], [address] [place], [place] [address]    Special Requests   Final    NONE Performed at [facility], [place],  [address]    Culture >=100,000 COLONIES/mL ESCHERICHIA COLI (A)  Final   Report Status 10/13/2018 FINAL  Final   Organism ID, Bacteria ESCHERICHIA COLI (A)  Final      Susceptibility   Escherichia coli - MIC*    AMPICILLIN >=32 RESISTANT Resistant     CEFAZOLIN 8 SENSITIVE Sensitive     CEFTRIAXONE <=1 SENSITIVE Sensitive     CIPROFLOXACIN <=0.25 SENSITIVE Sensitive     GENTAMICIN <=1 SENSITIVE Sensitive     IMIPENEM <=0.25 SENSITIVE Sensitive     NITROFURANTOIN  <=16 SENSITIVE Sensitive     TRIMETH/SULFA <=20 SENSITIVE Sensitive     AMPICILLIN/SULBACTAM >=32 RESISTANT Resistant     PIP/TAZO <=4 SENSITIVE Sensitive     Extended ESBL NEGATIVE Sensitive     * >=100,000 COLONIES/mL ESCHERICHIA COLI  SARS Coronavirus 2 [facility] order, Performed in [place] hospital lab) Nasopharyngeal Nasopharyngeal Swab     Status: None   Collection Time: 10/10/18  4:52 PM   Specimen: Nasopharyngeal Swab  Result Value Ref Range Status   SARS Coronavirus 2 NEGATIVE NEGATIVE Final    Comment: (NOTE) If result is NEGATIVE SARS-CoV-2 target nucleic acids are NOT DETECTED. The SARS-CoV-2 RNA is generally detectable in upper and lower  respiratory specimens during the acute phase of infection. The lowest  concentration of SARS-CoV-2 viral copies this assay can detect is 250  copies / mL. A negative result does not preclude SARS-CoV-2 infection  and should not be used as the sole basis for treatment or other  patient management decisions.  A negative result may occur with  improper specimen collection / handling, submission of specimen other  than nasopharyngeal swab, presence of viral mutation(s) within the  areas targeted by this assay, and inadequate number of viral copies  (<250 copies / mL). A negative result must be combined with clinical  observations, patient history, and epidemiological information. If result is POSITIVE SARS-CoV-2 target nucleic acids are DETECTED. The SARS-CoV-2 RNA is generally detectable in upper and lower  respiratory specimens dur ing the acute phase of infection.  Positive  results are indicative of active infection with SARS-CoV-2.  Clinical  correlation with patient history and other diagnostic information is  necessary to determine patient infection status.  Positive results do  not rule out bacterial infection or co-infection with other viruses. If result is PRESUMPTIVE POSTIVE SARS-CoV-2 nucleic acids MAY BE PRESENT.   A  presumptive positive result was obtained on the submitted specimen  and confirmed on repeat testing.  While 2019 novel coronavirus  (SARS-CoV-2) nucleic acids may be present in the submitted sample  additional confirmatory testing may be necessary for epidemiological  and / or clinical management purposes  to differentiate between  SARS-CoV-2 and other Sarbecovirus currently known to infect humans.  If clinically indicated additional testing with an alternate test  methodology [phone]) is advised. The SARS-CoV-2 RNA is generally  detectable in upper and lower respiratory sp ecimens during the acute  phase of infection. The expected result is Negative. Fact Sheet for Patients:  StrictlyIdeas.no Fact Sheet for Healthcare Providers: BankingDealers.co.za This test is not yet approved or cleared by the [place] FDA and has been authorized for detection and/or diagnosis of SARS-CoV-2 by FDA under an Emergency Use Authorization (EUA).  This EUA  will remain in effect (meaning this test can be used) for the duration of the COVID-19 declaration under Section 564(b)(1) of the Act, 21 U.S.C. section 360bbb-3(b)(1), unless the authorization is terminated or revoked sooner. Performed at [facility], [address] [place], [place] [address]   Culture, blood (routine x 2)     Status: None (Preliminary result)   Collection Time: 10/10/18  8:19 PM   Specimen: BLOOD  Result Value Ref Range Status   Specimen Description BLOOD RIGHT WRIST  Final   Special Requests   Final    BOTTLES DRAWN AEROBIC AND ANAEROBIC Blood Culture results may not be optimal due to an inadequate volume of blood received in culture bottles   Culture   Final    NO GROWTH 3 DAYS Performed at [facility], [address], [place], [place] [address]    Report Status PENDING  Incomplete  Culture, blood (routine x 2)     Status: None (Preliminary result)    Collection Time: 10/10/18  8:19 PM   Specimen: BLOOD  Result Value Ref Range Status   Specimen Description BLOOD LEFT ANTECUBITAL  Final   Special Requests   Final    BOTTLES DRAWN AEROBIC AND ANAEROBIC Blood Culture adequate volume   Culture   Final    NO GROWTH 3 DAYS Performed at [facility], [address], [place], [place] [address]    Report Status PENDING  Incomplete     Studies: No results found.  Scheduled Meds: . ezetimibe  10 mg Oral Daily  . heparin  5,000 Units Subcutaneous Q8H  . insulin aspart  0-5 Units Subcutaneous QHS  . insulin aspart  0-9 Units Subcutaneous TID WC  . lidocaine-prilocaine   Topical Once  . metoprolol succinate  50 mg Oral Daily  . nystatin  5 mL Oral QID  . pantoprazole (PROTONIX) IV  40 mg Intravenous Q12H  . Ensure Max Protein  11 oz Oral BID BM  . Tbo-Filgrastim  300 mcg Subcutaneous Once   Continuous Infusions: . 0.9 % NaCl with KCl 20 mEq / L    . cefTRIAXone (ROCEPHIN)  IV 1 g (10/13/18 0454)    Assessment/Plan:  1. Clinical sepsis with weakness, tachycardia leukocytosis and positive E. coli in the urine culture.  Continue Rocephin while here and probably switch over to p.o. Keflex upon going home 2. Severe esophagitis with persistent nausea and difficulty eating solid food especially after chemotherapy.  Continue IV Protonix.  Discontinue IV Diflucan. 3. Leukopenia now.  Give a dose of Granix. 4. Acute kidney injury and dehydration improved with IV fluids 5. Colon cancer undergoing chemotherapy with Dr. Janese Banks.  Dose of chemotherapy may have to be changed because patient has symptoms after each chemo session. 6. Chemotherapy-induced thrombocytopenia.  Continue to monitor peer 7. Type 2 diabetes mellitus with hypoglycemia the other day.  Start low-dose Lantus 7 units nightly and continue sliding scale.  Code Status:     Code Status Orders  (From admission, onward)         Start     Ordered   10/10/18 2216  Do not  attempt resuscitation (DNR)  Continuous    Question Answer Comment  In the event of cardiac or respiratory ARREST Do not call a "code blue"   In the event of cardiac or respiratory ARREST Do not perform Intubation, CPR, defibrillation or ACLS   In the event of cardiac or respiratory ARREST Use medication by any route, position, wound  care, and other measures to relive pain and suffering. May use oxygen, suction and manual treatment of airway obstruction as needed for comfort.   Comments d/w pt in ER      10/10/18 2215        Code Status History    Date Active Date Inactive Code Status Order ID Comments User Context   10/31/2017 2305 11/01/2017 2102 Partial Code [id number]  Mayo, Pete Pelt, MD Inpatient   Advance Care Planning Activity    Advance Directive Documentation     Most Recent Value  Type of Advance Directive  Healthcare Power of Attorney, Living will, Out of facility DNR (pink MOST or yellow form)  Pre-existing out of facility DNR order (yellow form or pink MOST form)  Physician notified to receive inpatient order  "MOST" Form in Place?  -      Disposition Plan: Hopefully will be improved to go home by tomorrow.  Antibiotics:  Rocephin  Time spent: 27 minutes.   Patient declined me calling family.  Maricarmen Braziel [employer]

## 2018-10-14 ENCOUNTER — Inpatient Hospital Stay: Payer: Medicare Other

## 2018-10-14 LAB — BASIC METABOLIC PANEL
Anion gap: 6 (ref 5–15)
BUN: 12 mg/dL (ref 8–23)
CO2: 18 mmol/L — ABNORMAL LOW (ref 22–32)
Calcium: 7.9 mg/dL — ABNORMAL LOW (ref 8.9–10.3)
Chloride: 114 mmol/L — ABNORMAL HIGH (ref 98–111)
Creatinine, Ser: 1.15 mg/dL (ref 0.61–1.24)
GFR calc Af Amer: >60 mL/min (ref >60)
GFR calc non Af Amer: 60 mL/min — ABNORMAL LOW (ref >60)
Glucose, Bld: 175 mg/dL — ABNORMAL HIGH (ref 70–99)
Potassium: 3.1 mmol/L — ABNORMAL LOW (ref 3.5–5.1)
Sodium: 138 mmol/L (ref 135–145)

## 2018-10-14 LAB — GASTROINTESTINAL PANEL BY PCR, STOOL (REPLACES STOOL CULTURE)

## 2018-10-14 LAB — GLUCOSE, CAPILLARY
Glucose-Capillary: 170 mg/dL — ABNORMAL HIGH (ref 70–99)
Glucose-Capillary: 133 mg/dL — ABNORMAL HIGH (ref 70–99)
Glucose-Capillary: 131 mg/dL — ABNORMAL HIGH (ref 70–99)
Glucose-Capillary: 153 mg/dL — ABNORMAL HIGH (ref 70–99)

## 2018-10-14 LAB — CBC
HCT: 29.9 % — ABNORMAL LOW (ref 39.0–52.0)
Hemoglobin: 9.9 g/dL — ABNORMAL LOW (ref 13.0–17.0)
MCH: 29.1 pg (ref 26.0–34.0)
MCHC: 33.1 g/dL (ref 30.0–36.0)
MCV: 87.9 fL (ref 80.0–100.0)
Platelets: 50 10*3/uL — ABNORMAL LOW (ref 150–400)
RBC: 3.4 MIL/uL — ABNORMAL LOW (ref 4.22–5.81)
RDW: 20.1 % — ABNORMAL HIGH (ref 11.5–15.5)
WBC: 1.6 10*3/uL — ABNORMAL LOW (ref 4.0–10.5)
nRBC: 0 % (ref 0.0–0.2)

## 2018-10-14 MED ORDER — POLYETHYLENE GLYCOL 3350 17 G PO PACK
17.0000 g | PACK | Freq: Every day | ORAL | Status: DC
  Administered 2018-10-16: 17 g via ORAL
  Filled 2018-10-14 (×3): qty 1

## 2018-10-14 MED ORDER — POTASSIUM CHLORIDE 10 MEQ/100ML IV SOLN
10.0000 meq | INTRAVENOUS | Status: AC
  Administered 2018-10-14 (×2): 10 meq via INTRAVENOUS
  Filled 2018-10-14 (×2): qty 100

## 2018-10-14 NOTE — Plan of Care (Signed)

## 2018-10-14 NOTE — Care Management Important Message (Signed)
Important Message  Patient Details  Name: Willie [name] MRN: [id number] Date of Birth: December 18, 1940   Medicare Important Message Given:  Yes     Juliann Pulse A Janyra Barillas 10/14/2018, 1:58 PM

## 2018-10-14 NOTE — Progress Notes (Signed)
Patient ID: Willie [name], male   DOB: [birth year], 78 y.o.   MRN: [id number]  Patient ID: Willie [name], male   DOB: 09/07/1940, 78 y.o.   MRN: [id number]  [facility] PROGRESS NOTE  Willie [name]:[id number] DOB: [birth year] DOA: 10/10/2018 PCP: [name], MD  HPI/Subjective: Patient had a lot of nausea last night requiring more medications.  Still having some abdominal pain occasional diarrhea.  Objective: Vitals:   10/14/18 0400 10/14/18 1517  BP: 120/71 135/61  Pulse: 84 85  Resp: 15 20  Temp: 98.6 F (37 C) 99 F (37.2 C)  SpO2: 100% 100%    Filed Weights   10/10/18 1454 10/10/18 2124  Weight: 70 kg 68.6 kg    ROS: Review of Systems  Constitutional: Negative for chills and fever.  Eyes: Negative for blurred vision.  Respiratory: Negative for cough, shortness of breath and wheezing.   Cardiovascular: Negative for chest pain.  Gastrointestinal: Positive for abdominal pain, diarrhea and nausea. Negative for constipation and vomiting.  Genitourinary: Negative for dysuria.  Musculoskeletal: Negative for joint pain.  Neurological: Negative for dizziness and headaches.   Exam: Physical Exam  Constitutional: He is oriented to person, place, and time.  HENT:  Nose: No mucosal edema.  Mouth/Throat: No oropharyngeal exudate or posterior oropharyngeal edema.  Eyes: Pupils are equal, round, and reactive to light. Conjunctivae, EOM and lids are normal.  Neck: No JVD present. Carotid bruit is not present. No edema present. No thyroid mass and no thyromegaly present.  Cardiovascular: S1 normal and S2 normal. Exam reveals no gallop.  No murmur heard. Pulses:      Dorsalis pedis pulses are 2+ on the right side and 2+ on the left side.  Respiratory: No respiratory distress. He has no wheezes. He has no rhonchi. He has no rales.  GI: Soft. Bowel sounds are normal. There is no abdominal tenderness.  Musculoskeletal:     Right ankle: He exhibits no swelling.   Left ankle: He exhibits no swelling.  Lymphadenopathy:    He has no cervical adenopathy.  Neurological: He is alert and oriented to person, place, and time. No cranial nerve deficit.  Skin: Skin is warm. No rash noted. Nails show no clubbing.  Psychiatric: He has a normal mood and affect.      Data Reviewed: Basic Metabolic Panel: Recent Labs  Lab 10/10/18 1455 10/11/18 0542 10/13/18 0510 10/14/18 0418  NA 134* 137 136 138  K 4.5 3.6 3.4* 3.1*  CL 105 110 110 114*  CO2 19* 22 18* 18*  GLUCOSE 293* 168* 197* 175*  BUN 23 14 10 12   CREATININE 1.37* 0.94 1.04 1.15  CALCIUM 8.7* 7.7* 7.9* 7.9*   Liver Function Tests: Recent Labs  Lab 10/10/18 1455  AST 27  ALT 13  ALKPHOS 177*  BILITOT 1.3*  PROT 6.6  ALBUMIN 3.7   Recent Labs  Lab 10/10/18 1455  LIPASE 21   CBC: Recent Labs  Lab 10/10/18 1455 10/11/18 0542 10/13/18 0510 10/14/18 0418  WBC 21.7* 6.3 1.8* 1.6*  HGB 11.6* 9.7* 10.4* 9.9*  HCT 34.5* 29.5* 30.6* 29.9*  MCV 88.0 89.4 86.7 87.9  PLT 129* 84* 60* 50*    CBG: Recent Labs  Lab 10/13/18 1141 10/13/18 1641 10/13/18 2111 10/14/18 0848 10/14/18 1143  GLUCAP 210* 185* 191* 170* 133*    Recent Results (from the past 240 hour(s))  Urine culture     Status: Abnormal   Collection Time: 10/10/18  4:21 PM  Specimen: Urine, Random  Result Value Ref Range Status   Specimen Description   Final    URINE, RANDOM Performed at [facility], [address] [place], [place] [address]    Special Requests   Final    NONE Performed at [facility], [place], [place] [address]    Culture >=100,000 COLONIES/mL ESCHERICHIA COLI (A)  Final   Report Status 10/13/2018 FINAL  Final   Organism ID, Bacteria ESCHERICHIA COLI (A)  Final      Susceptibility   Escherichia coli - MIC*    AMPICILLIN >=32 RESISTANT Resistant     CEFAZOLIN 8 SENSITIVE Sensitive     CEFTRIAXONE <=1 SENSITIVE Sensitive     CIPROFLOXACIN <=0.25  SENSITIVE Sensitive     GENTAMICIN <=1 SENSITIVE Sensitive     IMIPENEM <=0.25 SENSITIVE Sensitive     NITROFURANTOIN <=16 SENSITIVE Sensitive     TRIMETH/SULFA <=20 SENSITIVE Sensitive     AMPICILLIN/SULBACTAM >=32 RESISTANT Resistant     PIP/TAZO <=4 SENSITIVE Sensitive     Extended ESBL NEGATIVE Sensitive     * >=100,000 COLONIES/mL ESCHERICHIA COLI  SARS Coronavirus 2 [facility] order, Performed in [facility] hospital lab) Nasopharyngeal Nasopharyngeal Swab     Status: None   Collection Time: 10/10/18  4:52 PM   Specimen: Nasopharyngeal Swab  Result Value Ref Range Status   SARS Coronavirus 2 NEGATIVE NEGATIVE Final    Comment: (NOTE) If result is NEGATIVE SARS-CoV-2 target nucleic acids are NOT DETECTED. The SARS-CoV-2 RNA is generally detectable in upper and lower  respiratory specimens during the acute phase of infection. The lowest  concentration of SARS-CoV-2 viral copies this assay can detect is 250  copies / mL. A negative result does not preclude SARS-CoV-2 infection  and should not be used as the sole basis for treatment or other  patient management decisions.  A negative result may occur with  improper specimen collection / handling, submission of specimen other  than nasopharyngeal swab, presence of viral mutation(s) within the  areas targeted by this assay, and inadequate number of viral copies  (<250 copies / mL). A negative result must be combined with clinical  observations, patient history, and epidemiological information. If result is POSITIVE SARS-CoV-2 target nucleic acids are DETECTED. The SARS-CoV-2 RNA is generally detectable in upper and lower  respiratory specimens dur ing the acute phase of infection.  Positive  results are indicative of active infection with SARS-CoV-2.  Clinical  correlation with patient history and other diagnostic information is  necessary to determine patient infection status.  Positive results do  not rule out bacterial infection  or co-infection with other viruses. If result is PRESUMPTIVE POSTIVE SARS-CoV-2 nucleic acids MAY BE PRESENT.   A presumptive positive result was obtained on the submitted specimen  and confirmed on repeat testing.  While 2019 novel coronavirus  (SARS-CoV-2) nucleic acids may be present in the submitted sample  additional confirmatory testing may be necessary for epidemiological  and / or clinical management purposes  to differentiate between  SARS-CoV-2 and other Sarbecovirus currently known to infect humans.  If clinically indicated additional testing with an alternate test  methodology [phone]) is advised. The SARS-CoV-2 RNA is generally  detectable in upper and lower respiratory sp ecimens during the acute  phase of infection. The expected result is Negative. Fact Sheet for Patients:  StrictlyIdeas.no Fact Sheet for Healthcare Providers: BankingDealers.co.za This test is not yet approved or cleared by the [place] FDA and has  been authorized for detection and/or diagnosis of SARS-CoV-2 by FDA under an Emergency Use Authorization (EUA).  This EUA will remain in effect (meaning this test can be used) for the duration of the COVID-19 declaration under Section 564(b)(1) of the Act, 21 U.S.C. section 360bbb-3(b)(1), unless the authorization is terminated or revoked sooner. Performed at [facility], [address] [place], [place] [address]   Culture, blood (routine x 2)     Status: None (Preliminary result)   Collection Time: 10/10/18  8:19 PM   Specimen: BLOOD  Result Value Ref Range Status   Specimen Description BLOOD RIGHT WRIST  Final   Special Requests   Final    BOTTLES DRAWN AEROBIC AND ANAEROBIC Blood Culture results may not be optimal due to an inadequate volume of blood received in culture bottles   Culture   Final    NO GROWTH 4 DAYS Performed at [facility], [address] [place], [place]  [address]    Report Status PENDING  Incomplete  Culture, blood (routine x 2)     Status: None (Preliminary result)   Collection Time: 10/10/18  8:19 PM   Specimen: BLOOD  Result Value Ref Range Status   Specimen Description BLOOD LEFT ANTECUBITAL  Final   Special Requests   Final    BOTTLES DRAWN AEROBIC AND ANAEROBIC Blood Culture adequate volume   Culture   Final    NO GROWTH 4 DAYS Performed at [facility], [address] [place], [place] [address]    Report Status PENDING  Incomplete     Studies: Dg Abd 2 Views  Result Date: 10/14/2018 CLINICAL DATA:  Colon cancer.  Lower abdominal pain. EXAM: ABDOMEN - 2 VIEW COMPARISON:  None. FINDINGS: Fluid levels are present in non-dilated loops of small bowel. There is no obstruction. No free air is present. IMPRESSION: Ileus pattern without for obstruction free air. Electronically Signed   By: San Morelle M.D.   On: 10/14/2018 09:38   US Abdomen Limited Ruq  Result Date: 10/14/2018 CLINICAL DATA:  Nausea and vomiting for 7 days. EXAM: ULTRASOUND ABDOMEN LIMITED RIGHT UPPER QUADRANT COMPARISON:  CT of the abdomen pelvis 10/10/2018 FINDINGS: Gallbladder: Gallbladder is contracted. Wall thickness is within normal limits. No sonographic Percell Miller sign is reported. Common bile duct: Diameter: 1 mm, within normal limits Liver: The liver is mildly echogenic. There is some loss of normal internal architecture. No discrete lesions are present. Portal vein is patent on color Doppler imaging with normal direction of blood flow towards the liver. Other: None. IMPRESSION: 1. Normal sonographic appearance of the gallbladder. No evidence for acute cholecystitis. 2. Liver is mildly echogenic, this likely reflects diffuse fatty infiltration as noted on the CT scan. Electronically Signed   By: San Morelle M.D.   On: 10/14/2018 10:06    Scheduled Meds: . ezetimibe  10 mg Oral Daily  . heparin  5,000 Units Subcutaneous Q8H  . insulin aspart   0-5 Units Subcutaneous QHS  . insulin aspart  0-9 Units Subcutaneous TID WC  . insulin glargine  7 Units Subcutaneous QHS  . lidocaine-prilocaine   Topical Once  . metoprolol succinate  50 mg Oral Daily  . nystatin  5 mL Oral QID  . pantoprazole (PROTONIX) IV  40 mg Intravenous Q12H  . polyethylene glycol  17 g Oral Daily  . Ensure Max Protein  11 oz Oral BID BM   Continuous Infusions: . 0.9 % NaCl with KCl 20 mEq / L 40  mL/hr at 10/14/18 1149  . cefTRIAXone (ROCEPHIN)  IV Stopped (10/14/18 1408)    Assessment/Plan:  1. Clinical sepsis with weakness, tachycardia leukocytosis and positive E. coli in the urine culture.  Continue Rocephin while here and probably switch over to p.o. Keflex upon going home 2. Severe esophagitis with persistent nausea and difficulty eating.  Continue IV Protonix.  Right upper quadrant ultrasound negative.  X-ray showing ileus.  Put on clear liquid diet today and then try to advance to solid food tomorrow.  Stool studies ordered 3. Leukopenia now.  Give a dose of Granix. 4. Acute kidney injury and dehydration improved with IV fluids 5. Colon cancer undergoing chemotherapy with Dr. Janese Banks.  Dose of chemotherapy may have to be changed because patient has symptoms after each chemo session. 6. Chemotherapy-induced thrombocytopenia.  Continue to monitor. 7. Type 2 diabetes mellitus with hypoglycemia the other day.  Start low-dose Lantus 7 units nightly and continue sliding scale.  Code Status:     Code Status Orders  (From admission, onward)         Start     Ordered   10/10/18 2216  Do not attempt resuscitation (DNR)  Continuous    Question Answer Comment  In the event of cardiac or respiratory ARREST Do not call a "code blue"   In the event of cardiac or respiratory ARREST Do not perform Intubation, CPR, defibrillation or ACLS   In the event of cardiac or respiratory ARREST Use medication by any route, position, wound care, and other measures to relive pain  and suffering. May use oxygen, suction and manual treatment of airway obstruction as needed for comfort.   Comments d/w pt in ER      10/10/18 2215        Code Status History    Date Active Date Inactive Code Status Order ID Comments User Context   10/31/2017 2305 11/01/2017 2102 Partial Code [id number]  Mayo, Pete Pelt, MD Inpatient   Advance Care Planning Activity    Advance Directive Documentation     Most Recent Value  Type of Advance Directive  Healthcare Power of Attorney, Living will, Out of facility DNR (pink MOST or yellow form)  Pre-existing out of facility DNR order (yellow form or pink MOST form)  Physician notified to receive inpatient order  "MOST" Form in Place?  -      Disposition Plan: Evaluate getting every day to see if he is feeling better to go home.  Antibiotics:  Rocephin  Time spent: 25 minutes.   Patient declined me calling family.  Kristoff Coonradt [employer]

## 2018-10-15 ENCOUNTER — Inpatient Hospital Stay: Payer: Medicare Other

## 2018-10-15 DIAGNOSIS — Z9221 Personal history of antineoplastic chemotherapy: Secondary | ICD-10-CM

## 2018-10-15 DIAGNOSIS — R5381 Other malaise: Secondary | ICD-10-CM

## 2018-10-15 DIAGNOSIS — K219 Gastro-esophageal reflux disease without esophagitis: Secondary | ICD-10-CM

## 2018-10-15 DIAGNOSIS — I10 Essential (primary) hypertension: Secondary | ICD-10-CM

## 2018-10-15 DIAGNOSIS — E7800 Pure hypercholesterolemia, unspecified: Secondary | ICD-10-CM

## 2018-10-15 DIAGNOSIS — R197 Diarrhea, unspecified: Secondary | ICD-10-CM

## 2018-10-15 DIAGNOSIS — Z794 Long term (current) use of insulin: Secondary | ICD-10-CM

## 2018-10-15 DIAGNOSIS — C20 Malignant neoplasm of rectum: Secondary | ICD-10-CM

## 2018-10-15 DIAGNOSIS — Z79899 Other long term (current) drug therapy: Secondary | ICD-10-CM

## 2018-10-15 DIAGNOSIS — E43 Unspecified severe protein-calorie malnutrition: Secondary | ICD-10-CM

## 2018-10-15 DIAGNOSIS — E119 Type 2 diabetes mellitus without complications: Secondary | ICD-10-CM

## 2018-10-15 DIAGNOSIS — R1013 Epigastric pain: Secondary | ICD-10-CM

## 2018-10-15 DIAGNOSIS — R5383 Other fatigue: Secondary | ICD-10-CM

## 2018-10-15 DIAGNOSIS — D61818 Other pancytopenia: Secondary | ICD-10-CM

## 2018-10-15 LAB — CULTURE, BLOOD (ROUTINE X 2)
Culture: NO GROWTH
Culture: NO GROWTH
Special Requests: ADEQUATE

## 2018-10-15 LAB — C DIFFICILE QUICK SCREEN W PCR REFLEX
C Diff antigen: POSITIVE — AB
C Diff toxin: NEGATIVE

## 2018-10-15 LAB — BASIC METABOLIC PANEL
Anion gap: 9 (ref 5–15)
BUN: 11 mg/dL (ref 8–23)
CO2: 18 mmol/L — ABNORMAL LOW (ref 22–32)
Calcium: 8.5 mg/dL — ABNORMAL LOW (ref 8.9–10.3)
Chloride: 117 mmol/L — ABNORMAL HIGH (ref 98–111)
Creatinine, Ser: 1.31 mg/dL — ABNORMAL HIGH (ref 0.61–1.24)
GFR calc Af Amer: 59 mL/min — ABNORMAL LOW (ref >60)
GFR calc non Af Amer: 51 mL/min — ABNORMAL LOW (ref >60)
Glucose, Bld: 114 mg/dL — ABNORMAL HIGH (ref 70–99)
Potassium: 3.1 mmol/L — ABNORMAL LOW (ref 3.5–5.1)
Sodium: 144 mmol/L (ref 135–145)

## 2018-10-15 LAB — CLOSTRIDIUM DIFFICILE BY PCR, REFLEXED: Toxigenic C. Difficile by PCR: NEGATIVE

## 2018-10-15 LAB — GLUCOSE, CAPILLARY
Glucose-Capillary: 102 mg/dL — ABNORMAL HIGH (ref 70–99)
Glucose-Capillary: 159 mg/dL — ABNORMAL HIGH (ref 70–99)
Glucose-Capillary: 148 mg/dL — ABNORMAL HIGH (ref 70–99)
Glucose-Capillary: 150 mg/dL — ABNORMAL HIGH (ref 70–99)

## 2018-10-15 LAB — CBC
HCT: 30.9 % — ABNORMAL LOW (ref 39.0–52.0)
Hemoglobin: 10.5 g/dL — ABNORMAL LOW (ref 13.0–17.0)
MCH: 29.3 pg (ref 26.0–34.0)
MCHC: 34 g/dL (ref 30.0–36.0)
MCV: 86.3 fL (ref 80.0–100.0)
Platelets: 55 10*3/uL — ABNORMAL LOW (ref 150–400)
RBC: 3.58 MIL/uL — ABNORMAL LOW (ref 4.22–5.81)
RDW: 20.2 % — ABNORMAL HIGH (ref 11.5–15.5)
WBC: 1.5 10*3/uL — ABNORMAL LOW (ref 4.0–10.5)
nRBC: 0 % (ref 0.0–0.2)

## 2018-10-15 LAB — MAGNESIUM: Magnesium: 1.9 mg/dL (ref 1.7–2.4)

## 2018-10-15 MED ORDER — PROMETHAZINE HCL 25 MG/ML IJ SOLN
12.5000 mg | Freq: Once | INTRAMUSCULAR | Status: AC
  Administered 2018-10-15: 12.5 mg via INTRAVENOUS
  Filled 2018-10-15: qty 1

## 2018-10-15 MED ORDER — SUCRALFATE 1 GM/10ML PO SUSP
1.0000 g | Freq: Three times a day (TID) | ORAL | Status: DC
  Administered 2018-10-15 – 2018-10-17 (×9): 1 g via ORAL
  Filled 2018-10-15 (×8): qty 10

## 2018-10-15 MED ORDER — LOPERAMIDE HCL 2 MG PO CAPS
4.0000 mg | ORAL_CAPSULE | Freq: Once | ORAL | Status: AC
  Administered 2018-10-15: 4 mg via ORAL
  Filled 2018-10-15: qty 2

## 2018-10-15 MED ORDER — SODIUM CHLORIDE 0.9 % IV BOLUS
500.0000 mL | Freq: Once | INTRAVENOUS | Status: AC
  Administered 2018-10-15: 10:00:00 500 mL via INTRAVENOUS

## 2018-10-15 MED ORDER — POTASSIUM CHLORIDE 10 MEQ/100ML IV SOLN
10.0000 meq | INTRAVENOUS | Status: AC
  Administered 2018-10-15 (×4): 10 meq via INTRAVENOUS
  Filled 2018-10-15 (×4): qty 100

## 2018-10-15 MED ORDER — PROMETHAZINE HCL 25 MG PO TABS
25.0000 mg | ORAL_TABLET | Freq: Four times a day (QID) | ORAL | Status: DC | PRN
  Administered 2018-10-15 – 2018-10-16 (×2): 25 mg via ORAL
  Filled 2018-10-15 (×3): qty 1

## 2018-10-15 MED ORDER — POTASSIUM CHLORIDE 20 MEQ PO PACK
40.0000 meq | PACK | Freq: Once | ORAL | Status: AC
  Administered 2018-10-15: 09:00:00 40 meq via ORAL
  Filled 2018-10-15: qty 2

## 2018-10-15 MED ORDER — SODIUM CHLORIDE 0.9% FLUSH: 10.0000 mL | INTRAVENOUS | Status: DC | PRN

## 2018-10-15 NOTE — Progress Notes (Signed)
Patient ID: Willie [name], male   DOB: [birth year], 78 y.o.   MRN: [id number]  [facility] PROGRESS NOTE  Willie [name] [id number]:[id number] DOB: [birth year] DOA: 10/10/2018 PCP: [name], MD  HPI/Subjective: Patient was feeling better when I saw him this morning.  Patient started vomiting after trying to eat.  Patient states that he had 4 bowel movements yesterday one was normal and 3 were liquid.  He thinks it is from the chemotherapy.  Still having some abdominal cramping.  Objective: Vitals:   10/14/18 1920 10/15/18 0444  BP: (!) 143/105 121/69  Pulse: 96 (!) 102  Resp: 15   Temp: 99 F (37.2 C) 100 F (37.8 C)  SpO2: 100% 100%    Filed Weights   10/10/18 1454 10/10/18 2124  Weight: 70 kg 68.6 kg    ROS: Review of Systems  Constitutional: Negative for chills and fever.  Eyes: Negative for blurred vision.  Respiratory: Negative for cough, shortness of breath and wheezing.   Cardiovascular: Negative for chest pain.  Gastrointestinal: Positive for abdominal pain, diarrhea, nausea and vomiting. Negative for constipation.  Genitourinary: Negative for dysuria.  Musculoskeletal: Negative for joint pain.  Neurological: Negative for dizziness and headaches.   Exam: Physical Exam  Constitutional: He is oriented to person, place, and time.  HENT:  Nose: No mucosal edema.  Mouth/Throat: No oropharyngeal exudate or posterior oropharyngeal edema.  Eyes: Pupils are equal, round, and reactive to light. Conjunctivae, EOM and lids are normal.  Neck: No JVD present. Carotid bruit is not present. No edema present. No thyroid mass and no thyromegaly present.  Cardiovascular: S1 normal and S2 normal. Exam reveals no gallop.  No murmur heard. Pulses:      Dorsalis pedis pulses are 2+ on the right side and 2+ on the left side.  Respiratory: No respiratory distress. He has no wheezes. He has no rhonchi. He has no rales.  GI: Soft. Bowel sounds are normal. There is abdominal  tenderness.  Musculoskeletal:     Right ankle: He exhibits no swelling.     Left ankle: He exhibits no swelling.  Lymphadenopathy:    He has no cervical adenopathy.  Neurological: He is alert and oriented to person, place, and time. No cranial nerve deficit.  Skin: Skin is warm. No rash noted. Nails show no clubbing.  Psychiatric: He has a normal mood and affect.      Data Reviewed: Basic Metabolic Panel: Recent Labs  Lab 10/10/18 1455 10/11/18 0542 10/13/18 0510 10/14/18 0418 10/15/18 0624  NA 134* 137 136 138 144  K 4.5 3.6 3.4* 3.1* 3.1*  CL 105 110 110 114* 117*  CO2 19* 22 18* 18* 18*  GLUCOSE 293* 168* 197* 175* 114*  BUN 23 14 10 12 11   CREATININE 1.37* 0.94 1.04 1.15 1.31*  CALCIUM 8.7* 7.7* 7.9* 7.9* 8.5*  MG  --   --   --   --  1.9   Liver Function Tests: Recent Labs  Lab 10/10/18 1455  AST 27  ALT 13  ALKPHOS 177*  BILITOT 1.3*  PROT 6.6  ALBUMIN 3.7   Recent Labs  Lab 10/10/18 1455  LIPASE 21   CBC: Recent Labs  Lab 10/10/18 1455 10/11/18 0542 10/13/18 0510 10/14/18 0418 10/15/18 0624  WBC 21.7* 6.3 1.8* 1.6* 1.5*  HGB 11.6* 9.7* 10.4* 9.9* 10.5*  HCT 34.5* 29.5* 30.6* 29.9* 30.9*  MCV 88.0 89.4 86.7 87.9 86.3  PLT 129* 84* 60* 50* 55*    CBG:  Recent Labs  Lab 10/14/18 1143 10/14/18 1635 10/14/18 2106 10/15/18 0737 10/15/18 1201  GLUCAP 133* 131* 153* 102* 159*    Recent Results (from the past 240 hour(s))  Urine culture     Status: Abnormal   Collection Time: 10/10/18  4:21 PM   Specimen: Urine, Random  Result Value Ref Range Status   Specimen Description   Final    URINE, RANDOM Performed at [facility], [address], [place], [place] [address]    Special Requests   Final    NONE Performed at [facility], [address] [place], [place] [address]    Culture >=100,000 COLONIES/mL ESCHERICHIA COLI (A)  Final   Report Status 10/13/2018 FINAL  Final   Organism ID, Bacteria ESCHERICHIA COLI (A)   Final      Susceptibility   Escherichia coli - MIC*    AMPICILLIN >=32 RESISTANT Resistant     CEFAZOLIN 8 SENSITIVE Sensitive     CEFTRIAXONE <=1 SENSITIVE Sensitive     CIPROFLOXACIN <=0.25 SENSITIVE Sensitive     GENTAMICIN <=1 SENSITIVE Sensitive     IMIPENEM <=0.25 SENSITIVE Sensitive     NITROFURANTOIN <=16 SENSITIVE Sensitive     TRIMETH/SULFA <=20 SENSITIVE Sensitive     AMPICILLIN/SULBACTAM >=32 RESISTANT Resistant     PIP/TAZO <=4 SENSITIVE Sensitive     Extended ESBL NEGATIVE Sensitive     * >=100,000 COLONIES/mL ESCHERICHIA COLI  SARS Coronavirus 2 [facility] order, Performed in [facility] hospital lab) Nasopharyngeal Nasopharyngeal Swab     Status: None   Collection Time: 10/10/18  4:52 PM   Specimen: Nasopharyngeal Swab  Result Value Ref Range Status   SARS Coronavirus 2 NEGATIVE NEGATIVE Final    Comment: (NOTE) If result is NEGATIVE SARS-CoV-2 target nucleic acids are NOT DETECTED. The SARS-CoV-2 RNA is generally detectable in upper and lower  respiratory specimens during the acute phase of infection. The lowest  concentration of SARS-CoV-2 viral copies this assay can detect is 250  copies / mL. A negative result does not preclude SARS-CoV-2 infection  and should not be used as the sole basis for treatment or other  patient management decisions.  A negative result may occur with  improper specimen collection / handling, submission of specimen other  than nasopharyngeal swab, presence of viral mutation(s) within the  areas targeted by this assay, and inadequate number of viral copies  (<250 copies / mL). A negative result must be combined with clinical  observations, patient history, and epidemiological information. If result is POSITIVE SARS-CoV-2 target nucleic acids are DETECTED. The SARS-CoV-2 RNA is generally detectable in upper and lower  respiratory specimens dur ing the acute phase of infection.  Positive  results are indicative of active infection with  SARS-CoV-2.  Clinical  correlation with patient history and other diagnostic information is  necessary to determine patient infection status.  Positive results do  not rule out bacterial infection or co-infection with other viruses. If result is PRESUMPTIVE POSTIVE SARS-CoV-2 nucleic acids MAY BE PRESENT.   A presumptive positive result was obtained on the submitted specimen  and confirmed on repeat testing.  While 2019 novel coronavirus  (SARS-CoV-2) nucleic acids may be present in the submitted sample  additional confirmatory testing may be necessary for epidemiological  and / or clinical management purposes  to differentiate between  SARS-CoV-2 and other Sarbecovirus currently known to infect humans.  If clinically indicated additional testing with an alternate test  methodology [phone]) is advised. The SARS-CoV-2 RNA  is generally  detectable in upper and lower respiratory sp ecimens during the acute  phase of infection. The expected result is Negative. Fact Sheet for Patients:  StrictlyIdeas.no Fact Sheet for Healthcare Providers: BankingDealers.co.za This test is not yet approved or cleared by the [place] FDA and has been authorized for detection and/or diagnosis of SARS-CoV-2 by FDA under an Emergency Use Authorization (EUA).  This EUA will remain in effect (meaning this test can be used) for the duration of the COVID-19 declaration under Section 564(b)(1) of the Act, 21 U.S.C. section 360bbb-3(b)(1), unless the authorization is terminated or revoked sooner. Performed at [facility], [address] [place], [place] [address]   Culture, blood (routine x 2)     Status: None   Collection Time: 10/10/18  8:19 PM   Specimen: BLOOD  Result Value Ref Range Status   Specimen Description BLOOD RIGHT WRIST  Final   Special Requests   Final    BOTTLES DRAWN AEROBIC AND ANAEROBIC Blood Culture results may not be optimal  due to an inadequate volume of blood received in culture bottles   Culture   Final    NO GROWTH 5 DAYS Performed at [facility], [address] [place], [place] [address]    Report Status 10/15/2018 FINAL  Final  Culture, blood (routine x 2)     Status: None   Collection Time: 10/10/18  8:19 PM   Specimen: BLOOD  Result Value Ref Range Status   Specimen Description BLOOD LEFT ANTECUBITAL  Final   Special Requests   Final    BOTTLES DRAWN AEROBIC AND ANAEROBIC Blood Culture adequate volume   Culture   Final    NO GROWTH 5 DAYS Performed at [facility], [address] [place], [place] [address]    Report Status 10/15/2018 FINAL  Final  Gastrointestinal Panel by PCR , Stool     Status: None   Collection Time: 10/14/18  8:51 PM   Specimen: Stool  Result Value Ref Range Status   Campylobacter species NOT DETECTED NOT DETECTED Final   Plesimonas shigelloides NOT DETECTED NOT DETECTED Final   Salmonella species NOT DETECTED NOT DETECTED Final   Yersinia enterocolitica NOT DETECTED NOT DETECTED Final   Vibrio species NOT DETECTED NOT DETECTED Final   Vibrio cholerae NOT DETECTED NOT DETECTED Final   Enteroaggregative E coli (EAEC) NOT DETECTED NOT DETECTED Final   Enteropathogenic E coli (EPEC) NOT DETECTED NOT DETECTED Final   Enterotoxigenic E coli (ETEC) NOT DETECTED NOT DETECTED Final   Shiga like toxin producing E coli (STEC) NOT DETECTED NOT DETECTED Final   Shigella/Enteroinvasive E coli (EIEC) NOT DETECTED NOT DETECTED Final   Cryptosporidium NOT DETECTED NOT DETECTED Final   Cyclospora cayetanensis NOT DETECTED NOT DETECTED Final   Entamoeba histolytica NOT DETECTED NOT DETECTED Final   Giardia lamblia NOT DETECTED NOT DETECTED Final   Adenovirus F40/41 NOT DETECTED NOT DETECTED Final   Astrovirus NOT DETECTED NOT DETECTED Final   Norovirus GI/GII NOT DETECTED NOT DETECTED Final   Rotavirus A NOT DETECTED NOT DETECTED Final   Sapovirus (I, II, IV, and  V) NOT DETECTED NOT DETECTED Final    Comment: Performed at [facility], [address], [place], [place] [address]     Studies: Dg Abd 2 Views  Result Date: 10/14/2018 CLINICAL DATA:  Colon cancer.  Lower abdominal pain. EXAM: ABDOMEN - 2 VIEW COMPARISON:  None. FINDINGS: Fluid levels are present in non-dilated loops of small bowel.  There is no obstruction. No free air is present. IMPRESSION: Ileus pattern without for obstruction free air. Electronically Signed   By: San Morelle M.D.   On: 10/14/2018 09:38   US Abdomen Limited Ruq  Result Date: 10/14/2018 CLINICAL DATA:  Nausea and vomiting for 7 days. EXAM: ULTRASOUND ABDOMEN LIMITED RIGHT UPPER QUADRANT COMPARISON:  CT of the abdomen pelvis 10/10/2018 FINDINGS: Gallbladder: Gallbladder is contracted. Wall thickness is within normal limits. No sonographic Percell Miller sign is reported. Common bile duct: Diameter: 1 mm, within normal limits Liver: The liver is mildly echogenic. There is some loss of normal internal architecture. No discrete lesions are present. Portal vein is patent on color Doppler imaging with normal direction of blood flow towards the liver. Other: None. IMPRESSION: 1. Normal sonographic appearance of the gallbladder. No evidence for acute cholecystitis. 2. Liver is mildly echogenic, this likely reflects diffuse fatty infiltration as noted on the CT scan. Electronically Signed   By: San Morelle M.D.   On: 10/14/2018 10:06    Scheduled Meds: . ezetimibe  10 mg Oral Daily  . heparin  5,000 Units Subcutaneous Q8H  . insulin aspart  0-5 Units Subcutaneous QHS  . insulin aspart  0-9 Units Subcutaneous TID WC  . insulin glargine  7 Units Subcutaneous QHS  . lidocaine-prilocaine   Topical Once  . metoprolol succinate  50 mg Oral Daily  . nystatin  5 mL Oral QID  . pantoprazole (PROTONIX) IV  40 mg Intravenous Q12H  . polyethylene glycol  17 g Oral Daily  . Ensure Max Protein  11 oz Oral BID BM  .  sucralfate  1 g Oral TID WC & HS   Continuous Infusions: . cefTRIAXone (ROCEPHIN)  IV 1 g (10/15/18 0455)  . potassium chloride      Assessment/Plan:  1. Severe esophagitis with persistent nausea and vomiting with difficulty eating.  Continue IV Protonix.  Right upper quadrant ultrasound negative.  X-ray showing ileus.  Trying to advance diet.  Had to switch Zofran over to Phenergan secondary to persistent nausea vomiting.  Added Carafate.  Since patient now having a fever, I will send off stool for C. difficile.  Other stool comprehensive panel negative. 2. Low-grade fever, leukopenia.  Stool comprehensive panel negative.  Send off stool for C. difficile.  Get a chest x-ray to see if he aspirated with his nausea vomiting.  Patient already on Rocephin. 3. Clinical sepsis secondary to UTI.  On Rocephin 4. Acute kidney injury and dehydration.  Since creatinine went up a little bit this morning I will give a fluid bolus. 5. Hypokalemia replace potassium and IV and I am not sure if he will take it orally at this point. 6. Colon cancer undergoing chemotherapy with Dr. Janese Banks.  Case discussed with Dr. Janese Banks. 7. Chemotherapy-induced thrombocytopenia.  Continue to monitor. 8. Type 2 diabetes mellitus with hypoglycemia the other day.  Continue low-dose Lantus 7 units nightly and continue sliding scale.  Code Status:     Code Status Orders  (From admission, onward)         Start     Ordered   10/10/18 2216  Do not attempt resuscitation (DNR)  Continuous    Question Answer Comment  In the event of cardiac or respiratory ARREST Do not call a "code blue"   In the event of cardiac or respiratory ARREST Do not perform Intubation, CPR, defibrillation or ACLS   In the event of cardiac or respiratory ARREST Use medication by any route,  position, wound care, and other measures to relive pain and suffering. May use oxygen, suction and manual treatment of airway obstruction as needed for comfort.   Comments  d/w pt in ER      10/10/18 2215        Code Status History    Date Active Date Inactive Code Status Order ID Comments User Context   10/31/2017 2305 11/01/2017 2102 Partial Code [id number]  Mayo, Pete Pelt, MD Inpatient   [facility] Care Planning Activity    [facility] Directive Documentation     Most Recent Value  Type of Advance Directive  Healthcare Power of Attorney, Living will, Out of facility DNR (pink MOST or yellow form)  Pre-existing out of facility DNR order (yellow form or pink MOST form)  Physician notified to receive inpatient order  "MOST" Form in Place?  -      Disposition Plan: Evaluate getting every day to see if he is feeling better to go home.  Antibiotics:  Rocephin  Time spent: 28 minutes.  Case discussed with Dr. Janese Banks oncology Patient declined me calling family.  Creedon Danielski [employer]

## 2018-10-15 NOTE — TOC Progression Note (Signed)
Transition of Care [facility]) - Progression Note    Patient Details  Name: Willie [name] MRN: [id number] Date of Birth: January 20, 1941  [facility] CM/SW Contact  Shelbie Hutching, RN Phone Number: 10/15/2018, 3:33 PM  Clinical Narrative:    Patient has been having nausea, vomiting, and diarrhea not controlled by medication.  If nausea can be controlled overnight and tolerate diet patient can discharge tomorrow.  Patient requested home health services and [facility] agreed to accept referral.    Expected Discharge Plan: [place] Barriers to Discharge: Continued Medical Work up  Expected Discharge Plan and Services Expected Discharge Plan: [place]: [place] arrangements for the past 2 months: Single Family Home                                       Social Determinants of Health (SDOH) Interventions    Readmission Risk Interventions No flowsheet data found.

## 2018-10-15 NOTE — Consult Note (Signed)
Hematology/Oncology Consult note [facility] [phone] [phone]  Patient Care Team: [name], MD as PCP - General (Internal Medicine) [name], RN as Registered Nurse   Name of the patient: Willie [name]  [id number]  11/16/1940    Reason for consult: rectal cancer   Requesting physician: DR. 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  He is status post 4 cycles of neoadjuvant FOLFOX chemotherapy given with onpro Neulasta support on 10/05/2018.  He was admitted to the hospital with symptoms of abdominal pain nausea and vomiting.  CT abdomen showed no acute allergy.  GI was consulted and patient went EGD which showed grade D severe reflux esophagitis.  Patient is currently on IV Protonix twice daily.  He has been having significant nausea and unable to tolerate much oral intake  Pain scale- 4   Review of systems- Review of Systems  Constitutional: Positive for malaise/fatigue. Negative for chills, fever and weight loss.  HENT: Negative for congestion, ear discharge and nosebleeds.   Eyes: Negative for blurred vision.  Respiratory: Negative for cough, hemoptysis, sputum production, shortness of breath and wheezing.   Cardiovascular: Negative for chest pain, palpitations, orthopnea and claudication.  Gastrointestinal: Positive for abdominal pain, nausea and vomiting. Negative for blood in stool, constipation, diarrhea, heartburn and melena.  Genitourinary: Negative for dysuria, flank pain, frequency, hematuria and urgency.  Musculoskeletal: Negative for back pain, joint pain and myalgias.  Skin: Negative for rash.  Neurological: Negative for dizziness, tingling, focal weakness, seizures, weakness and headaches.  Endo/Heme/Allergies: Does not bruise/bleed  easily.  Psychiatric/Behavioral: Negative for depression and suicidal ideas. The patient does not have insomnia.     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   History of gastric ulcer 07/07/2018   Ventricular tachycardia ([place]) 10/31/2017   Benign prostatic hyperplasia 09/16/2017   Controlled type 2 diabetes mellitus without complication, with long-term current use of insulin ([place]) 09/16/2017   Coronary artery disease involving native heart without angina pectoris 09/16/2017   Essential hypertension 09/16/2017   Onychomycosis due to dermatophyte 05/28/2013     Past Medical History:  Diagnosis Date   Anemia    Cancer ([place])    Coronary artery disease    Diabetes mellitus without complication ([place])    History of hiatal hernia    Hypercholesterolemia    Hypertension      Past Surgical History:  Procedure Laterality Date   COLONOSCOPY WITH PROPOFOL N/A 07/15/2018   Procedure: COLONOSCOPY WITH PROPOFOL;  Surgeon: [name], [name], MD;  Location: [facility] ENDOSCOPY;  Service: Gastroenterology;  Laterality: N/A;   ESOPHAGOGASTRODUODENOSCOPY N/A 10/12/2018   Procedure: ESOPHAGOGASTRODUODENOSCOPY (EGD);  Surgeon: [name], MD;  Location: [facility] ENDOSCOPY;  Service: Gastroenterology;  Laterality: N/A;   ESOPHAGOGASTRODUODENOSCOPY (EGD) WITH PROPOFOL N/A 07/15/2018   Procedure: ESOPHAGOGASTRODUODENOSCOPY (EGD) WITH PROPOFOL;  Surgeon: [name], [name], MD;  Location: [facility] ENDOSCOPY;  Service: Gastroenterology;  Laterality: N/A;   PORTACATH PLACEMENT N/A 08/13/2018   Procedure: INSERTION PORT-A-CATH;  Surgeon: [name], MD;  Location: [facility] ORS;  Service: General;   Laterality: N/A;  PROSTATE SURGERY      Social History   Socioeconomic History   Marital status: Widowed    Spouse name: Not on file   Number of children: 0   Years of education: Not on file   Highest education level: Not on file  Occupational History   Occupation: Pharmacist, hospital    Comment: Retired  Scientist, product/process development strain: Not hard at [employer]    Worry: Never true    Inability: Never true   Transportation needs    Medical: No    Non-medical: No  Tobacco Use   Smoking status: Never Smoker   Smokeless tobacco: Never Used  Substance and Sexual Activity   Alcohol use: Not Currently   Drug use: Never   Sexual activity: Not Currently  Lifestyle   Physical activity    Days per week: 2 days    Minutes per session: 30 min   Stress: Only a little  Relationships   Social connections    Talks on phone: More than three times a week    Gets together: More than three times a week    Attends religious service: Not on file    Active member of club or organization: Not on file    Attends meetings of clubs or organizations: Not on file    Relationship status: Widowed   Intimate partner violence    Fear of current or ex partner: No    Emotionally abused: No    Physically abused: No    Forced sexual activity: No  Other Topics Concern   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  His wife was a Camera operator and they traveled frequently.  Several nieces and nephews.  He is the youngest of his brothers and sisters and only surviving.     History reviewed. No pertinent family history.   Current Facility-Administered Medications:    acetaminophen (TYLENOL) tablet 650 mg, 650 mg, Oral, Q6H PRN **OR** acetaminophen (TYLENOL) suppository 650 mg, 650 mg, Rectal, Q6H PRN, Fritzi Mandes, MD   cefTRIAXone  (ROCEPHIN) 1 g in sodium chloride 0.9 % 100 mL IVPB, 1 g, Intravenous, Q24H, Wieting, Richard, MD, Stopped at 10/15/18 0745   ezetimibe (ZETIA) tablet 10 mg, 10 mg, Oral, Daily, Fritzi Mandes, MD, 10 mg at 10/14/18 1146   heparin injection 5,000 Units, 5,000 Units, Subcutaneous, Q8H, Fritzi Mandes, MD, 5,000 Units at 10/15/18 1340   insulin aspart (novoLOG) injection 0-5 Units, 0-5 Units, Subcutaneous, QHS, Patel, Sona, MD   insulin aspart (novoLOG) injection 0-9 Units, 0-9 Units, Subcutaneous, TID WC, Fritzi Mandes, MD, 2 Units at 10/15/18 1215   insulin glargine (LANTUS) injection 7 Units, 7 Units, Subcutaneous, QHS, Wieting, Richard, MD, 7 Units at 10/14/18 2142   lidocaine-prilocaine (EMLA) cream, , Topical, Once, Fritzi Mandes, MD   metoprolol succinate (TOPROL-XL) 24 hr tablet 50 mg, 50 mg, Oral, Daily, Fritzi Mandes, MD, 50 mg at 10/15/18 0932   nystatin (MYCOSTATIN) 100000 UNIT/ML suspension 500,000 Units, 5 mL, Oral, QID, Wieting, Richard, MD, 500,000 Units at 10/15/18 1341   ondansetron (ZOFRAN) tablet 8 mg, 8 mg, Oral, BID PRN, Fritzi Mandes, MD, 8 mg at 10/15/18 9147   oxyCODONE (Oxy IR/ROXICODONE) immediate release tablet 5 mg, 5 mg, Oral, Q6H PRN, Fritzi Mandes, MD   pantoprazole (PROTONIX) injection 40 mg, 40 mg, Intravenous, Q12H, Wieting, Richard,  MD, 40 mg at 10/15/18 0935   polyethylene glycol (MIRALAX / GLYCOLAX) packet 17 g, 17 g, Oral, Daily, Wieting, Richard, MD   potassium chloride 10 mEq in 100 mL IVPB, 10 mEq, Intravenous, Q1 Hr x 4, Wieting, Richard, MD, Last Rate: 100 mL/hr at 10/15/18 1341, 10 mEq at 10/15/18 1341   prochlorperazine (COMPAZINE) tablet 10 mg, 10 mg, Oral, Q6H PRN, Fritzi Mandes, MD   promethazine (PHENERGAN) tablet 25 mg, 25 mg, Oral, Q6H PRN, Leslye Peer, Richard, MD   protein supplement (ENSURE MAX) liquid, 11 oz, Oral, BID BM, Wieting, Richard, MD, 11 oz at 10/14/18 1408   sucralfate (CARAFATE) 1 GM/10ML suspension 1 g, 1 g, Oral, TID WC & HS, Wieting,  Richard, MD   traZODone (DESYREL) tablet 50 mg, 50 mg, Oral, QHS PRN, Fritzi Mandes, MD, 50 mg at 10/10/18 2345   Physical exam:  Vitals:   10/14/18 0400 10/14/18 1517 10/14/18 1920 10/15/18 0444  BP: 120/71 135/61 (!) 143/105 121/69  Pulse: 84 85 96 (!) 102  Resp: 15 20 15    Temp: 98.6 F (37 C) 99 F (37.2 C) 99 F (37.2 C) 100 F (37.8 C)  TempSrc: Oral Oral Oral Oral  SpO2: 100% 100% 100% 100%  Weight:      Height:       Physical Exam Constitutional:      Comments: Appears fatigued and in mild distress  HENT:     Head: Normocephalic and atraumatic.  Eyes:     Pupils: Pupils are equal, round, and reactive to light.  Neck:     Musculoskeletal: Normal range of motion.  Cardiovascular:     Rate and Rhythm: Normal rate and regular rhythm.     Heart sounds: Normal heart sounds.  Pulmonary:     Effort: Pulmonary effort is normal.     Breath sounds: Normal breath sounds.  Abdominal:     General: Bowel sounds are normal.     Palpations: Abdomen is soft.     Tenderness: There is abdominal tenderness in the epigastric area.  Skin:    General: Skin is warm and dry.  Neurological:     Mental Status: He is alert and oriented to person, place, and time.        CMP Latest Ref Rng & Units 10/15/2018  Glucose 70 - 99 mg/dL 114(H)  BUN 8 - 23 mg/dL 11  Creatinine 0.61 - 1.24 mg/dL 1.31(H)  Sodium 135 - 145 mmol/L 144  Potassium 3.5 - 5.1 mmol/L 3.1(L)  Chloride 98 - 111 mmol/L 117(H)  CO2 22 - 32 mmol/L 18(L)  Calcium 8.9 - 10.3 mg/dL 8.5(L)  Total Protein 6.5 - 8.1 g/dL -  Total Bilirubin 0.3 - 1.2 mg/dL -  Alkaline Phos 38 - 126 U/L -  AST 15 - 41 U/L -  ALT 0 - 44 U/L -   CBC Latest Ref Rng & Units 10/15/2018  WBC 4.0 - 10.5 K/uL 1.5(L)  Hemoglobin 13.0 - 17.0 g/dL 10.5(L)  Hematocrit 39.0 - 52.0 % 30.9(L)  Platelets 150 - 400 K/uL 55(L)    @IMAGES @  Dg Chest 2 View  Result Date: 10/15/2018 CLINICAL DATA:  Fever, history of malignancy, CAD, diabetes. Nonsmoker  EXAM: CHEST - 2 VIEW COMPARISON:  Chest radiograph October 10, 2018, chest CT Aug 07, 2018 FINDINGS: Subcutaneous tunneled port catheter overlies the right chest wall with the tip in the upper SVC. No consolidation, features of edema, pneumothorax, or effusion. Pulmonary vascularity is normally distributed. The cardiomediastinal contours are  unremarkable. No acute osseous or soft tissue abnormality. Degenerative changes are present in the and imaged spine and shoulders. IMPRESSION: No active cardiopulmonary disease. Electronically Signed   By: Lovena Le M.D.   On: 10/15/2018 13:52   Ct Abdomen Pelvis W Contrast  Result Date: 10/10/2018 CLINICAL DATA:  Decreased appetite, nausea. History of colon cancer. EXAM: CT ABDOMEN AND PELVIS WITH CONTRAST TECHNIQUE: Multidetector CT imaging of the abdomen and pelvis was performed using the standard protocol following bolus administration of intravenous contrast. CONTRAST:  151mL OMNIPAQUE IOHEXOL 300 MG/ML  SOLN COMPARISON:  07/23/2018 FINDINGS: Lower chest: Coronary artery calcifications. Heart is normal size. Lung bases clear. No effusions. Hepatobiliary: Diffuse low-density throughout the liver compatible with fatty infiltration. No focal abnormality. Gallbladder unremarkable. Pancreas: No focal abnormality or ductal dilatation. Spleen: No focal abnormality.  Normal size. Adrenals/Urinary Tract: Small cyst in the lower pole of the right kidney. No hydronephrosis. No suspicious renal or adrenal lesion. Urinary bladder decompressed. A portion of the left anterior bladder wall again extends into a left inguinal hernia, stable. Stomach/Bowel: Again noted is area of wall thickening within the rectum and sigmoid colon compatible with patient's known colorectal cancer. Previously seen 2nd rounded mass within the sigmoid colon not as well visualized on today's study. I suspect1 a soft tissue density on image 50 within the sigmoid colon corresponds to the previously seen mass,  but is much smaller on today's study. Extensive colonic diverticulosis. Appendix is normal. Stomach and small bowel decompressed. Vascular/Lymphatic: Aortic atherosclerosis. No aneurysm. Again noted are small lymph nodes in the pelvis in the perirectal space, stable or slightly decreased in size. No new or enlarging lymph nodes. Reproductive: Post prostatectomy. Other: No free fluid or free air. Musculoskeletal: Degenerative changes in the lumbar spine. No aggressive osseous lesions. IMPRESSION: Again noted are areas of wall thickening in the recto sigmoid colon compatible with patient's known colorectal cancer. The previously seen 2nd rounded mass in the sigmoid colon not definitively seen or smaller on today's study when compared to prior study. Diffuse colonic diverticulosis. Small perirectal lymph nodes are stable or slightly decreased in size. Mild fatty infiltration of the liver. Electronically Signed   By: Rolm Baptise M.D.   On: 10/10/2018 19:04   Dg Chest Port 1 View  Result Date: 10/10/2018 CLINICAL DATA:  Decreased appetite, nausea and mild abdominal pain x 3 days. Instructed to come to ER for dehydration. Hx of HTN, diabetes, CAD, colon cancer, port a cath insertion 09/02/2018. Patient states that he has 2 chemotherapy treatments remaining for colon cancer and will then move on to radiation tx. EXAM: PORTABLE CHEST 1 VIEW COMPARISON:  08/13/2018 FINDINGS: Cardiac silhouette is normal in size. No mediastinal or hilar masses. No evidence of adenopathy. Lungs are clear. Right anterior chest wall internal jugular Port-A-Cath is stable. Skeletal structures are grossly intact. IMPRESSION: No active disease. Electronically Signed   By: Lajean Manes M.D.   On: 10/10/2018 17:42   Dg Abd 2 Views  Result Date: 10/14/2018 CLINICAL DATA:  Colon cancer.  Lower abdominal pain. EXAM: ABDOMEN - 2 VIEW COMPARISON:  None. FINDINGS: Fluid levels are present in non-dilated loops of small bowel. There is no  obstruction. No free air is present. IMPRESSION: Ileus pattern without for obstruction free air. Electronically Signed   By: San Morelle M.D.   On: 10/14/2018 09:38   US Abdomen Limited Ruq  Result Date: 10/14/2018 CLINICAL DATA:  Nausea and vomiting for 7 days. EXAM: ULTRASOUND ABDOMEN LIMITED RIGHT UPPER  QUADRANT COMPARISON:  CT of the abdomen pelvis 10/10/2018 FINDINGS: Gallbladder: Gallbladder is contracted. Wall thickness is within normal limits. No sonographic Percell Miller sign is reported. Common bile duct: Diameter: 1 mm, within normal limits Liver: The liver is mildly echogenic. There is some loss of normal internal architecture. No discrete lesions are present. Portal vein is patent on color Doppler imaging with normal direction of blood flow towards the liver. Other: None. IMPRESSION: 1. Normal sonographic appearance of the gallbladder. No evidence for acute cholecystitis. 2. Liver is mildly echogenic, this likely reflects diffuse fatty infiltration as noted on the CT scan. Electronically Signed   By: San Morelle M.D.   On: 10/14/2018 10:06    Assessment and plan- Patient is a 78 y.o. male with stage IIIb rectal adenocarcinoma currently undergoing neoadjuvant chemoradiation for significant nausea vomiting and found to have acute severe reflux esophagitis  1.  Patient had a prior EGD in May 2020 which did not show any evidence of esophagitis and currently patient has severe grade D esophagitis in the lower one third of the esophagus clinically consistent with reflux esophagitis.  Patient is already on IV Protonix daily and still continue to have significant nausea until yesterday.  Patient said that he was feeling a little better today and did drink 1 can of Ensure.  Recommend adding sucralfate to Protonix and see if symptoms are better.  Also have patient follow a low fiber diet keep the head end of bed elevated.  If patient does not have significant calorie intake the next 1 or 2 days  we may have to consider supplemental feeding and it may be worthwhile to have dietician on board as well.  Continue PRN Zofran for nausea and if patient continues to have nausea tomorrow it may be worthwhile to add Zyprexa 10 mg at night and discontinue nausea medications which are not working for him.  2.  AKI: Resolved after IV hydration.  3. Diarrhea: C. difficile negative and CT abdomen did not show any acute pathology.  Possibly secondary to chemotherapy.  Continue to monitor  4. Pancytopenia: Patient's white count is down to 1.5 hemoglobin is stable around 10 and platelets are down to 55.  This is expected at around 10 days post chemotherapy.  Patient received Neulasta on 10/07/2018.  He is currently afebrile and therefore it is okay to monitor without giving him Neupogen  5. Rectal cancer: Chemotherapy will be on hold until acute issues resolve   Thank you for this kind referral and the opportunity to participate in the care of this  Patient   Visit Diagnosis 1. Generalized abdominal pain   2. Weakness   3. Urinary tract infection without hematuria, site unspecified   4. Leukocytosis, unspecified type   5. Nausea & vomiting   6. Fever     Dr. Randa Evens, MD, MPH [facility] [phone] 10/15/2018 10:21 PM

## 2018-10-16 ENCOUNTER — Inpatient Hospital Stay: Payer: Medicare Other

## 2018-10-16 LAB — BASIC METABOLIC PANEL
Anion gap: 8 (ref 5–15)
BUN: 16 mg/dL (ref 8–23)
CO2: 19 mmol/L — ABNORMAL LOW (ref 22–32)
Calcium: 8.3 mg/dL — ABNORMAL LOW (ref 8.9–10.3)
Chloride: 117 mmol/L — ABNORMAL HIGH (ref 98–111)
Creatinine, Ser: 1.28 mg/dL — ABNORMAL HIGH (ref 0.61–1.24)
GFR calc Af Amer: >60 mL/min (ref >60)
GFR calc non Af Amer: 53 mL/min — ABNORMAL LOW (ref >60)
Glucose, Bld: 123 mg/dL — ABNORMAL HIGH (ref 70–99)
Potassium: 3.2 mmol/L — ABNORMAL LOW (ref 3.5–5.1)
Sodium: 144 mmol/L (ref 135–145)

## 2018-10-16 LAB — GLUCOSE, CAPILLARY
Glucose-Capillary: 105 mg/dL — ABNORMAL HIGH (ref 70–99)
Glucose-Capillary: 137 mg/dL — ABNORMAL HIGH (ref 70–99)
Glucose-Capillary: 131 mg/dL — ABNORMAL HIGH (ref 70–99)
Glucose-Capillary: 99 mg/dL (ref 70–99)

## 2018-10-16 MED ORDER — ONDANSETRON HCL 4 MG/2ML IJ SOLN
4.0000 mg | Freq: Four times a day (QID) | INTRAMUSCULAR | Status: DC | PRN
  Administered 2018-10-16: 4 mg via INTRAVENOUS
  Filled 2018-10-16: qty 2

## 2018-10-16 MED ORDER — SODIUM CHLORIDE 0.9 % IV SOLN
INTRAVENOUS | Status: DC | PRN
  Administered 2018-10-16: 500 mL via INTRAVENOUS
  Administered 2018-10-17: 250 mL via INTRAVENOUS

## 2018-10-16 MED ORDER — PANTOPRAZOLE SODIUM 40 MG PO TBEC
40.0000 mg | DELAYED_RELEASE_TABLET | Freq: Two times a day (BID) | ORAL | Status: DC
  Administered 2018-10-16 – 2018-10-17 (×3): 40 mg via ORAL
  Filled 2018-10-16 (×3): qty 1

## 2018-10-16 MED ORDER — OLANZAPINE 10 MG PO TABS
10.0000 mg | ORAL_TABLET | Freq: Every day | ORAL | Status: DC
  Administered 2018-10-16: 10 mg via ORAL
  Filled 2018-10-16 (×2): qty 1

## 2018-10-16 MED ORDER — SODIUM CHLORIDE 0.9 % IV SOLN: 1.0000 g | INTRAVENOUS | Status: DC

## 2018-10-16 NOTE — Consult Note (Addendum)
[facility] SURGICAL CONSULTATION NOTE (initial) - cpt: 40086   HISTORY OF PRESENT ILLNESS (HPI):  78 y.o. male who was admitted to [facility] on 08/01 after presenting with complaints of nausea, emesis, abdominal pain, and poor PO intake. Work up in the ED revealed a leukocytosis to 21, UTI, and AKI secondary to dehydration and he was admitted to the medicine service. While admitted he was complaining of some dysphagia and was evaluated by GI and found to have esophagitis on EGD for which he is being managed with PPI.   Of note, the patient has a history of stage IIIb rectal adenocarcinoma for which he has been undergoing neoadjuvant chemotherapy. He is currently status post 4 cycles as of 07/27. Throughout the entire course of his chemotherapy he reports having issues with nausea and emesis. This morning on evaluation, he notes mild nausea but no emesis. He has tolerated a diet but reports "it doesn't taste good." He denied any abdominal distension. He continues to have bowel movements and reports having a BM earlier today. No other acute issues or complaints this morning.   Surgery is consulted by hospitalist physician Dr. Benjie Karvonen, MD in this context for evaluation and management of possible ileus.    PAST MEDICAL HISTORY (PMH):  Past Medical History:  Diagnosis Date  . Anemia   . Cancer ([place])   . Coronary artery disease   . Diabetes mellitus without complication ([place])   . History of hiatal hernia   . Hypercholesterolemia   . Hypertension      PAST SURGICAL HISTORY ([place]):  Past Surgical History:  Procedure Laterality Date  . COLONOSCOPY WITH PROPOFOL N/A 07/15/2018   Procedure: COLONOSCOPY WITH PROPOFOL;  Surgeon: [name], [name], MD;  Location: [facility] ENDOSCOPY;  Service: Gastroenterology;  Laterality: N/A;  . ESOPHAGOGASTRODUODENOSCOPY N/A 10/12/2018   Procedure: ESOPHAGOGASTRODUODENOSCOPY (EGD);  Surgeon: [name], MD;  Location: [facility] ENDOSCOPY;  Service:  Gastroenterology;  Laterality: N/A;  . ESOPHAGOGASTRODUODENOSCOPY (EGD) WITH PROPOFOL N/A 07/15/2018   Procedure: ESOPHAGOGASTRODUODENOSCOPY (EGD) WITH PROPOFOL;  Surgeon: [name], [name], MD;  Location: [facility] ENDOSCOPY;  Service: Gastroenterology;  Laterality: N/A;  . PORTACATH PLACEMENT N/A 08/13/2018   Procedure: INSERTION PORT-A-CATH;  Surgeon: [name], MD;  Location: [facility] ORS;  Service: General;  Laterality: N/A;  . PROSTATE SURGERY       MEDICATIONS:  Prior to Admission medications   Medication Sig Start Date End Date Taking? Authorizing Provider  dexamethasone (DECADRON) 4 MG tablet Take 2 tablets (8 mg total) by mouth daily. Start the day after chemotherapy for 2 days. Take with food. 08/05/18  Yes Sindy Guadeloupe, MD  ezetimibe (ZETIA) 10 MG tablet Take 10 mg by mouth every morning.    Yes [provider]  HUMALOG MIX 75/25 KWIKPEN (75-25) 100 UNIT/ML Kwikpen Inject 30 Units into the skin every morning.  08/02/17  Yes [provider]  lisinopril (PRINIVIL,ZESTRIL) 5 MG tablet Take 5 mg by mouth every morning.  08/02/17  Yes [provider]  metoprolol succinate (TOPROL-XL) 50 MG 24 hr tablet Take 50 mg by mouth every morning.  10/03/17  Yes [provider]  nystatin (MYCOSTATIN) 100000 UNIT/ML suspension TAKE 1 TEASPOONSFUL BY MOUTH 4 TIMES DAILY 09/14/18  Yes Sindy Guadeloupe, MD  ondansetron (ZOFRAN) 8 MG tablet Take 1 tablet (8 mg total) by mouth 2 (two) times daily as needed for refractory nausea / vomiting. Start on day 3 after chemotherapy. 08/05/18  Yes Sindy Guadeloupe, MD  oxyCODONE (OXY IR/ROXICODONE)  5 MG immediate release tablet Take 1 tablet (5 mg total) by mouth every 6 (six) hours as needed for severe pain. 08/17/18  Yes Sindy Guadeloupe, MD  pantoprazole (PROTONIX) 40 MG tablet Take 40 mg by mouth every morning.    Yes [provider]  traZODone (DESYREL) 50 MG tablet Take 50 mg by mouth at bedtime as needed for sleep.    Yes  [provider]  Baclofen 5 MG TABS Take 5 mg by mouth 3 (three) times daily. 09/25/18   Sindy Guadeloupe, MD  lidocaine-prilocaine (EMLA) cream Apply to affected area once 08/05/18   Sindy Guadeloupe, MD  niacin (NIASPAN) 500 MG CR tablet Take 500 mg by mouth every evening.  08/02/17   [provider]  prochlorperazine (COMPAZINE) 10 MG tablet Take 1 tablet (10 mg total) by mouth every 6 (six) hours as needed (Nausea or vomiting). 08/05/18   Sindy Guadeloupe, MD     ALLERGIES:  No Known Allergies   SOCIAL HISTORY:  Social History   Socioeconomic History  . Marital status: Widowed    Spouse name: Not on file  . Number of children: 0  . Years of education: Not on file  . Highest education level: Not on file  Occupational History  . Occupation: Pharmacist, hospital    Comment: Retired  Scientific laboratory technician  . Financial resource strain: Not hard at all  . Food insecurity    Worry: Never true    Inability: Never true  . Transportation needs    Medical: No    Non-medical: No  Tobacco Use  . Smoking status: Never Smoker  . Smokeless tobacco: Never Used  Substance and Sexual Activity  . Alcohol use: Not Currently  . Drug use: Never  . Sexual activity: Not Currently  Lifestyle  . Physical activity    Days per week: 2 days    Minutes per session: 30 min  . Stress: Only a little  Relationships  . Social connections    Talks on phone: More than three times a week    Gets together: More than three times a week    Attends religious service: Not on file    Active member of club or organization: Not on file    Attends meetings of clubs or organizations: Not on file    Relationship status: Widowed  . Intimate partner violence    Fear of current or ex partner: No    Emotionally abused: No    Physically abused: No    Forced sexual activity: No  Other Topics Concern  . Not on file  Social History Narrative   Patient is retired Chief Executive Officer.  He was widowed approximately 1 year ago (2019).  His wife was a Camera operator and they traveled frequently.  Several nieces and nephews.  He is the youngest of his brothers and sisters and only surviving.     FAMILY HISTORY:  History reviewed. No pertinent family history.    REVIEW OF SYSTEMS:  Review of Systems  Constitutional: Negative for chills and fever.  HENT: Negative for congestion and sore throat.   Respiratory: Negative for cough and shortness of breath.   Cardiovascular: Negative for chest pain and palpitations.  Gastrointestinal: Positive for nausea. Negative for abdominal pain, constipation, diarrhea and vomiting.  Genitourinary: Negative for dysuria and urgency.  Neurological: Negative for dizziness.  All other systems reviewed and are negative.   VITAL SIGNS:  Temp:  [  97.8 F (36.6 C)-99 F (37.2 C)] 98.4 F (36.9 C) (08/07 0344) Pulse Rate:  [68-88] 88 (08/07 0344) Resp:  [16-18] 16 (08/07 0344) BP: (137-166)/(61-74) 137/68 (08/07 0344) SpO2:  [100 %] 100 % (08/07 0344)     Height: 5\' 3"  (160 cm) Weight: 68.6 kg BMI (Calculated): 26.8   INTAKE/OUTPUT:  08/06 0701 - 08/07 0700 In: 585.1 [I.V.:0.1; IV Piggyback:585] Out: -   PHYSICAL EXAM:  Physical Exam Vitals signs and nursing note reviewed.  Constitutional:      General: He is not in acute distress.    Appearance: He is well-developed and normal weight. He is not ill-appearing.     Comments: Patient sitting up in bed eating breakfast  HENT:     Head: Normocephalic and atraumatic.  Cardiovascular:     Rate and Rhythm: Normal rate and regular rhythm.     Heart sounds: Normal heart sounds. No murmur. No friction rub. No gallop.   Pulmonary:     Effort: Pulmonary effort is normal. No respiratory distress.     Breath sounds: No wheezing.  Abdominal:     General: Abdomen is flat.     Palpations: Abdomen is soft.     Tenderness: There is no abdominal tenderness. There is no guarding or  rebound.  Genitourinary:    Comments: Deferred Skin:    General: Skin is warm and dry.     Coloration: Skin is not jaundiced or pale.  Neurological:     General: No focal deficit present.     Mental Status: He is alert and oriented to person, place, and time.  Psychiatric:        Mood and Affect: Mood normal.        Behavior: Behavior normal.      Labs:  CBC Latest Ref Rng & Units 10/15/2018 10/14/2018 10/13/2018  WBC 4.0 - 10.5 K/uL 1.5(L) 1.6(L) 1.8(L)  Hemoglobin 13.0 - 17.0 g/dL 10.5(L) 9.9(L) 10.4(L)  Hematocrit 39.0 - 52.0 % 30.9(L) 29.9(L) 30.6(L)  Platelets 150 - 400 K/uL 55(L) 50(L) 60(L)   CMP Latest Ref Rng & Units 10/16/2018 10/15/2018 10/14/2018  Glucose 70 - 99 mg/dL 123(H) 114(H) 175(H)  BUN 8 - 23 mg/dL 16 11 12   Creatinine 0.61 - 1.24 mg/dL 1.28(H) 1.31(H) 1.15  Sodium 135 - 145 mmol/L 144 144 138  Potassium 3.5 - 5.1 mmol/L 3.2(L) 3.1(L) 3.1(L)  Chloride 98 - 111 mmol/L 117(H) 117(H) 114(H)  CO2 22 - 32 mmol/L 19(L) 18(L) 18(L)  Calcium 8.9 - 10.3 mg/dL 8.3(L) 8.5(L) 7.9(L)  Total Protein 6.5 - 8.1 g/dL - - -  Total Bilirubin 0.3 - 1.2 mg/dL - - -  Alkaline Phos 38 - 126 U/L - - -  AST 15 - 41 U/L - - -  ALT 0 - 44 U/L - - -     Imaging studies:   KUB (10/16/2018) personally reviewed which does not appear unchanged compared to prior and is without free air, radiologist report reviewed:  IMPRESSION: Persistent nondilated loops of small large bowel again noted. This suggest mild adynamic ileus. No significant change from prior exam.   Assessment/Plan: (ICD-10's: R11.2) 77 y.o. male with nausea and intermittent emesis which likely is more attributable to his chemotherapy as the patient complained of nausea throughout his cycles rather than a true ileus, as he is having bowel function.    - Okay for diet as tolerated   - antiemetics prn  - monitor abdominal examination; on-going bowel function  - no acute  surgical intervention indicated  - appreciate GI /  Oncology recommendation   - further management per primary service    - No surgical issues identified; general surgery will sign off  All of the above findings and recommendations were discussed with the patient and the medical service, and all of patient's questions were answered to his expressed satisfaction.  Thank you for the opportunity to participate in this patient's care.   -- Edison Simon, PA-C [facility] 10/16/2018, 11:11 AM [phone] M-F: 7am - 4pm   I saw and evaluated the patient.  I agree with the above documentation, exam, and plan, which I have edited where appropriate. Fredirick Maudlin  11:25 AM

## 2018-10-16 NOTE — Care Management Important Message (Signed)
Important Message  Patient Details  Name: Willie [name] MRN: [id number] Date of Birth: Oct 07, 1940   Medicare Important Message Given:  Yes     Abdallah Hern A Lynley Killilea, RN 10/16/2018, 10:54 AM

## 2018-10-16 NOTE — Progress Notes (Signed)
Dr Benjie Karvonen aware that pt complaining of nausea, new order for zofran

## 2018-10-16 NOTE — Progress Notes (Signed)
PHARMACIST - PHYSICIAN COMMUNICATION  DR:   Benjie Karvonen  CONCERNING: IV to Oral Route Change Policy  RECOMMENDATION: This patient is receiving pantoprazole by the intravenous route.  Based on criteria approved by the Pharmacy and Therapeutics Committee, the intravenous medication(s) is/are being converted to the equivalent oral dose form(s).   DESCRIPTION: These criteria include:  The patient is eating (either orally or via tube) and/or has been taking other orally administered medications for a least 24 hours  The patient has no evidence of active gastrointestinal bleeding or impaired GI absorption (gastrectomy, short bowel, patient on TNA or NPO).  If you have questions about this conversion, please contact the Pharmacy Department  []   [phone] )  [facility] [x]   [phone] )  [facility] []   [phone] )  [facility] []   [phone] )  [facility] []   [phone] )  [place], PharmD, BCPS Clinical Pharmacist 10/16/2018 10:51 AM

## 2018-10-16 NOTE — Progress Notes (Signed)
Hematology/Oncology Consult note [facility]  [phone] [phone]  Patient Care Team: [name], MD as PCP - General (Internal Medicine) [name], RN as Registered Nurse   Name of the patient: Willie [name]  [id number]  1940/10/10   Date of visit: 10/16/18   Interval history- feels somewhat improved than yesterday but oral intake is still poor and still feels nauseous. He is moving his bowels normally  Review of systems- Review of Systems  Constitutional: Positive for malaise/fatigue. Negative for chills, fever and weight loss.  HENT: Negative for congestion, ear discharge and nosebleeds.   Eyes: Negative for blurred vision.  Respiratory: Negative for cough, hemoptysis, sputum production, shortness of breath and wheezing.   Cardiovascular: Negative for chest pain, palpitations, orthopnea and claudication.  Gastrointestinal: Positive for nausea. Negative for abdominal pain, blood in stool, constipation, diarrhea, heartburn, melena and vomiting.  Genitourinary: Negative for dysuria, flank pain, frequency, hematuria and urgency.  Musculoskeletal: Negative for back pain, joint pain and myalgias.  Skin: Negative for rash.  Neurological: Negative for dizziness, tingling, focal weakness, seizures, weakness and headaches.  Endo/Heme/Allergies: Does not bruise/bleed easily.  Psychiatric/Behavioral: Negative for depression and suicidal ideas. The patient does not have insomnia.       No Known Allergies   Past Medical History:  Diagnosis Date  . Anemia   . Cancer ([place])   . Coronary artery disease   . Diabetes mellitus without complication ([place])   . History of hiatal hernia   . Hypercholesterolemia   . Hypertension      Past Surgical History:  Procedure Laterality Date  . COLONOSCOPY WITH PROPOFOL N/A 07/15/2018   Procedure: COLONOSCOPY WITH PROPOFOL;  Surgeon: [name], [name], MD;  Location: [facility] ENDOSCOPY;   Service: Gastroenterology;  Laterality: N/A;  . ESOPHAGOGASTRODUODENOSCOPY N/A 10/12/2018   Procedure: ESOPHAGOGASTRODUODENOSCOPY (EGD);  Surgeon: [name], MD;  Location: [facility] ENDOSCOPY;  Service: Gastroenterology;  Laterality: N/A;  . ESOPHAGOGASTRODUODENOSCOPY (EGD) WITH PROPOFOL N/A 07/15/2018   Procedure: ESOPHAGOGASTRODUODENOSCOPY (EGD) WITH PROPOFOL;  Surgeon: [name], [name], MD;  Location: [facility] ENDOSCOPY;  Service: Gastroenterology;  Laterality: N/A;  . PORTACATH PLACEMENT N/A 08/13/2018   Procedure: INSERTION PORT-A-CATH;  Surgeon: [name], MD;  Location: [facility] ORS;  Service: General;  Laterality: N/A;  . PROSTATE SURGERY      Social History   Socioeconomic History  . Marital status: Widowed    Spouse name: Not on file  . Number of children: 0  . Years of education: Not on file  . Highest education level: Not on file  Occupational History  . Occupation: Pharmacist, hospital    Comment: Retired  Scientific laboratory technician  . Financial resource strain: Not hard at all  . Food insecurity    Worry: Never true    Inability: Never true  . Transportation needs    Medical: No    Non-medical: No  Tobacco Use  . Smoking status: Never Smoker  . Smokeless tobacco: Never Used  Substance and Sexual Activity  . Alcohol use: Not Currently  . Drug use: Never  . Sexual activity: Not Currently  Lifestyle  . Physical activity    Days per week: 2 days    Minutes per session: 30 min  . Stress: Only a little  Relationships  . Social connections    Talks on phone: More than three times a week    Gets together: More than three times a week    Attends religious service: Not on file  Active member of club or organization: Not on file    Attends meetings of clubs or organizations: Not on file    Relationship status: Widowed  . Intimate partner violence    Fear of current or ex partner: No    Emotionally abused: No    Physically abused: No    Forced sexual activity: No  Other Topics  Concern  . 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  His wife was a Camera operator and they traveled frequently.  Several nieces and nephews.  He is the youngest of his brothers and sisters and only surviving.    History reviewed. No pertinent family history.   Current Facility-Administered Medications:  .  0.9 %  sodium chloride infusion, , Intravenous, PRN, Loletha Grayer, MD, Stopped at 10/16/18 [phone] .  acetaminophen (TYLENOL) tablet 650 mg, 650 mg, Oral, Q6H PRN **OR** acetaminophen (TYLENOL) suppository 650 mg, 650 mg, Rectal, Q6H PRN, Fritzi Mandes, MD .  ezetimibe (ZETIA) tablet 10 mg, 10 mg, Oral, Daily, Fritzi Mandes, MD, 10 mg at 10/16/18 0805 .  heparin injection 5,000 Units, 5,000 Units, Subcutaneous, Q8H, Fritzi Mandes, MD, 5,000 Units at 10/16/18 1313 .  insulin aspart (novoLOG) injection 0-5 Units, 0-5 Units, Subcutaneous, QHS, Patel, Sona, MD .  insulin aspart (novoLOG) injection 0-9 Units, 0-9 Units, Subcutaneous, TID WC, Fritzi Mandes, MD, 1 Units at 10/16/18 1150 .  insulin glargine (LANTUS) injection 7 Units, 7 Units, Subcutaneous, QHS, Loletha Grayer, MD, 7 Units at 10/15/18 2021 .  lidocaine-prilocaine (EMLA) cream, , Topical, Once, Fritzi Mandes, MD .  metoprolol succinate (TOPROL-XL) 24 hr tablet 50 mg, 50 mg, Oral, Daily, Fritzi Mandes, MD, 50 mg at 10/16/18 0806 .  nystatin (MYCOSTATIN) 100000 UNIT/ML suspension 500,000 Units, 5 mL, Oral, QID, Loletha Grayer, MD, 500,000 Units at 10/16/18 1313 .  ondansetron (ZOFRAN) tablet 8 mg, 8 mg, Oral, BID PRN, Fritzi Mandes, MD, 8 mg at 10/15/18 0926 .  oxyCODONE (Oxy IR/ROXICODONE) immediate release tablet 5 mg, 5 mg, Oral, Q6H PRN, Fritzi Mandes, MD .  pantoprazole (PROTONIX) EC tablet 40 mg, 40 mg, Oral, BID AC, Shanlever, Charles M, RPH .  polyethylene glycol (MIRALAX / GLYCOLAX) packet 17 g, 17 g,  Oral, Daily, Loletha Grayer, MD, 17 g at 10/16/18 0805 .  prochlorperazine (COMPAZINE) tablet 10 mg, 10 mg, Oral, Q6H PRN, Fritzi Mandes, MD .  promethazine (PHENERGAN) tablet 25 mg, 25 mg, Oral, Q6H PRN, Loletha Grayer, MD, 25 mg at 10/16/18 0437 .  protein supplement (ENSURE MAX) liquid, 11 oz, Oral, BID BM, Loletha Grayer, MD, 11 oz at 10/16/18 1313 .  sodium chloride flush (NS) 0.9 % injection 10-40 mL, 10-40 mL, Intracatheter, PRN, Wieting, Richard, MD .  sucralfate (CARAFATE) 1 GM/10ML suspension 1 g, 1 g, Oral, TID WC & HS, Wieting, Richard, MD, 1 g at 10/16/18 1150 .  traZODone (DESYREL) tablet 50 mg, 50 mg, Oral, QHS PRN, Fritzi Mandes, MD, 50 mg at 10/10/18 2345  Physical exam:  Vitals:   10/15/18 1609 10/15/18 1950 10/16/18 0344 10/16/18 1435  BP: 138/61 (!) 166/74 137/68 132/64  Pulse: 68 75 88 81  Resp: 18 17 16 18   Temp: 97.8 F (36.6 C) 99 F (37.2 C) 98.4 F (36.9 C) 98.6 F (37 C)  TempSrc: Oral Oral Oral   SpO2: 100% 100% 100% 100%  Weight:      Height:  Physical Exam Constitutional:      Comments: Appears fatigued  HENT:     Head: Normocephalic and atraumatic.  Eyes:     Pupils: Pupils are equal, round, and reactive to light.  Neck:     Musculoskeletal: Normal range of motion.  Cardiovascular:     Rate and Rhythm: Normal rate and regular rhythm.     Heart sounds: Normal heart sounds.  Pulmonary:     Effort: Pulmonary effort is normal.     Breath sounds: Normal breath sounds.  Abdominal:     General: Bowel sounds are normal. There is no distension.     Palpations: Abdomen is soft.     Tenderness: There is no abdominal tenderness.  Skin:    General: Skin is warm and dry.  Neurological:     Mental Status: He is alert and oriented to person, place, and time.      CMP Latest Ref Rng & Units 10/16/2018  Glucose 70 - 99 mg/dL 123(H)  BUN 8 - 23 mg/dL 16  Creatinine 0.61 - 1.24 mg/dL 1.28(H)  Sodium 135 - 145 mmol/L 144  Potassium 3.5 - 5.1  mmol/L 3.2(L)  Chloride 98 - 111 mmol/L 117(H)  CO2 22 - 32 mmol/L 19(L)  Calcium 8.9 - 10.3 mg/dL 8.3(L)  Total Protein 6.5 - 8.1 g/dL -  Total Bilirubin 0.3 - 1.2 mg/dL -  Alkaline Phos 38 - 126 U/L -  AST 15 - 41 U/L -  ALT 0 - 44 U/L -   CBC Latest Ref Rng & Units 10/15/2018  WBC 4.0 - 10.5 K/uL 1.5(L)  Hemoglobin 13.0 - 17.0 g/dL 10.5(L)  Hematocrit 39.0 - 52.0 % 30.9(L)  Platelets 150 - 400 K/uL 55(L)    @IMAGES @  Dg Chest 2 View  Result Date: 10/15/2018 CLINICAL DATA:  Fever, history of malignancy, CAD, diabetes. Nonsmoker EXAM: CHEST - 2 VIEW COMPARISON:  Chest radiograph October 10, 2018, chest CT Aug 07, 2018 FINDINGS: Subcutaneous tunneled port catheter overlies the right chest wall with the tip in the upper SVC. No consolidation, features of edema, pneumothorax, or effusion. Pulmonary vascularity is normally distributed. The cardiomediastinal contours are unremarkable. No acute osseous or soft tissue abnormality. Degenerative changes are present in the and imaged spine and shoulders. IMPRESSION: No active cardiopulmonary disease. Electronically Signed   By: Lovena Le M.D.   On: 10/15/2018 13:52   Dg Abd 1 View  Result Date: 10/16/2018 CLINICAL DATA:  Ileus. EXAM: ABDOMEN - 1 VIEW COMPARISON:  10/14/2018. FINDINGS: Persistent nondilated loops of small and large bowel noted again suggesting mild adynamic ileus. No free air is identified. Degenerative change lumbar spine. Degenerative changes both hips. Surgical clips in the pelvis. IMPRESSION: Persistent nondilated loops of small large bowel again noted. This suggest mild adynamic ileus. No significant change from prior exam. Electronically Signed   By: [place]   On: 10/16/2018 07:48   Ct Abdomen Pelvis W Contrast  Result Date: 10/10/2018 CLINICAL DATA:  Decreased appetite, nausea. History of colon cancer. EXAM: CT ABDOMEN AND PELVIS WITH CONTRAST TECHNIQUE: Multidetector CT imaging of the abdomen and pelvis was performed  using the standard protocol following bolus administration of intravenous contrast. CONTRAST:  17mL OMNIPAQUE IOHEXOL 300 MG/ML  SOLN COMPARISON:  07/23/2018 FINDINGS: Lower chest: Coronary artery calcifications. Heart is normal size. Lung bases clear. No effusions. Hepatobiliary: Diffuse low-density throughout the liver compatible with fatty infiltration. No focal abnormality. Gallbladder unremarkable. Pancreas: No focal abnormality or ductal dilatation. Spleen: No focal abnormality.  Normal size. Adrenals/Urinary Tract: Small cyst in the lower pole of the right kidney. No hydronephrosis. No suspicious renal or adrenal lesion. Urinary bladder decompressed. A portion of the left anterior bladder wall again extends into a left inguinal hernia, stable. Stomach/Bowel: Again noted is area of wall thickening within the rectum and sigmoid colon compatible with patient's known colorectal cancer. Previously seen 2nd rounded mass within the sigmoid colon not as well visualized on today's study. I suspect1 a soft tissue density on image 50 within the sigmoid colon corresponds to the previously seen mass, but is much smaller on today's study. Extensive colonic diverticulosis. Appendix is normal. Stomach and small bowel decompressed. Vascular/Lymphatic: Aortic atherosclerosis. No aneurysm. Again noted are small lymph nodes in the pelvis in the perirectal space, stable or slightly decreased in size. No new or enlarging lymph nodes. Reproductive: Post prostatectomy. Other: No free fluid or free air. Musculoskeletal: Degenerative changes in the lumbar spine. No aggressive osseous lesions. IMPRESSION: Again noted are areas of wall thickening in the recto sigmoid colon compatible with patient's known colorectal cancer. The previously seen 2nd rounded mass in the sigmoid colon not definitively seen or smaller on today's study when compared to prior study. Diffuse colonic diverticulosis. Small perirectal lymph nodes are stable or  slightly decreased in size. Mild fatty infiltration of the liver. Electronically Signed   By: Rolm Baptise M.D.   On: 10/10/2018 19:04   Dg Chest Port 1 View  Result Date: 10/10/2018 CLINICAL DATA:  Decreased appetite, nausea and mild abdominal pain x 3 days. Instructed to come to ER for dehydration. Hx of HTN, diabetes, CAD, colon cancer, port a cath insertion 09/02/2018. Patient states that he has 2 chemotherapy treatments remaining for colon cancer and will then move on to radiation tx. EXAM: PORTABLE CHEST 1 VIEW COMPARISON:  08/13/2018 FINDINGS: Cardiac silhouette is normal in size. No mediastinal or hilar masses. No evidence of adenopathy. Lungs are clear. Right anterior chest wall internal jugular Port-A-Cath is stable. Skeletal structures are grossly intact. IMPRESSION: No active disease. Electronically Signed   By: Lajean Manes M.D.   On: 10/10/2018 17:42   Dg Abd 2 Views  Result Date: 10/14/2018 CLINICAL DATA:  Colon cancer.  Lower abdominal pain. EXAM: ABDOMEN - 2 VIEW COMPARISON:  None. FINDINGS: Fluid levels are present in non-dilated loops of small bowel. There is no obstruction. No free air is present. IMPRESSION: Ileus pattern without for obstruction free air. Electronically Signed   By: San Morelle M.D.   On: 10/14/2018 09:38   US Abdomen Limited Ruq  Result Date: 10/14/2018 CLINICAL DATA:  Nausea and vomiting for 7 days. EXAM: ULTRASOUND ABDOMEN LIMITED RIGHT UPPER QUADRANT COMPARISON:  CT of the abdomen pelvis 10/10/2018 FINDINGS: Gallbladder: Gallbladder is contracted. Wall thickness is within normal limits. No sonographic Percell Miller sign is reported. Common bile duct: Diameter: 1 mm, within normal limits Liver: The liver is mildly echogenic. There is some loss of normal internal architecture. No discrete lesions are present. Portal vein is patent on color Doppler imaging with normal direction of blood flow towards the liver. Other: None. IMPRESSION: 1. Normal sonographic  appearance of the gallbladder. No evidence for acute cholecystitis. 2. Liver is mildly echogenic, this likely reflects diffuse fatty infiltration as noted on the CT scan. Electronically Signed   By: San Morelle M.D.   On: 10/14/2018 10:06     Assessment and plan- Patient is a 78 y.o. male with stage III rectal adenocarcinoma currently undergoing neoadjuvant chemotherapy admitted for  severe nausea/vomiting secondary to acute reflux esophagitis  1. Acute reflex esophagitis: likely the cause of nausea/vomiting. Continue protonix bid and carafate. He states none of the nausea medications are helping him. Will keep prn zofran on board but d/c prn compazine. Add olanzapine 10 mg QHS  2. Pancytopenia: secondary to chemothearpy. He has received neulasta on 7/29. Counts shoukld improve in the next week or so  3. Rectal cancer- chemotherapy on hold until aI will f/u with him next week as scheduled   Visit Diagnosis 1. Generalized abdominal pain   2. Weakness   3. Urinary tract infection without hematuria, site unspecified   4. Leukocytosis, unspecified type   5. Nausea & vomiting   6. Fever   7. Ileus [facility])      Dr. Randa Evens, MD, MPH [facility] [phone] 10/16/2018 3:10 PM

## 2018-10-16 NOTE — Progress Notes (Signed)
[place] [facility] [place] NAME: Willie [name]    MR#:  [id number]  DATE OF BIRTH:  1940-07-27  SUBJECTIVE:  Patient doing better this am still with some nausea  REVIEW OF SYSTEMS:    Review of Systems  Constitutional: Negative for fever, chills weight loss HENT: Negative for ear pain, nosebleeds, congestion, facial swelling, rhinorrhea, neck pain, neck stiffness and ear discharge.   Respiratory: Negative for cough, shortness of breath, wheezing  Cardiovascular: Negative for chest pain, palpitations and leg swelling.  Gastrointestinal: Negative for heartburn, abdominal pain, vomiting, diarrhea or consitpation + Nausea  Genitourinary: Negative for dysuria, urgency, frequency, hematuria Musculoskeletal: Negative for back pain or joint pain Neurological: Negative for dizziness, seizures, syncope, focal weakness,  numbness and headaches.  Hematological: Does not bruise/bleed easily.  Psychiatric/Behavioral: Negative for hallucinations, confusion, dysphoric mood    Tolerating Diet:somewhat      DRUG ALLERGIES:  No Known Allergies  VITALS:  Blood pressure 137/68, pulse 88, temperature 98.4 F (36.9 C), temperature source Oral, resp. rate 16, height 5\' 3"  (1.6 m), weight 68.6 kg, SpO2 100 %.  PHYSICAL EXAMINATION:  Constitutional: Appears well-developed and well-nourished. No distress. HENT: Normocephalic. Marland Kitchen Oropharynx is clear and moist.  Eyes: Conjunctivae and EOM are normal. PERRLA, no scleral icterus.  Neck: Normal ROM. Neck supple. No JVD. No tracheal deviation. CVS: RRR, S1/S2 +, no murmurs, no gallops, no carotid bruit.  Pulmonary: Effort and breath sounds normal, no stridor, rhonchi, wheezes, rales.  Abdominal: Soft. BS +,  no distension, tenderness, rebound or guarding.  Musculoskeletal: Normal range of motion. No edema and no tenderness.  Neuro: Alert. CN 2-12 grossly intact. No focal deficits. Skin: Skin is warm and dry. No rash  noted. Psychiatric: Normal mood and affect.      LABORATORY PANEL:   CBC Recent Labs  Lab 10/15/18 0624  WBC 1.5*  HGB 10.5*  HCT 30.9*  PLT 55*   ------------------------------------------------------------------------------------------------------------------  Chemistries  Recent Labs  Lab 10/10/18 1455  10/15/18 0624 10/16/18 0813  NA 134*   < > 144 144  K 4.5   < > 3.1* 3.2*  CL 105   < > 117* 117*  CO2 19*   < > 18* 19*  GLUCOSE 293*   < > 114* 123*  BUN 23   < > 11 16  CREATININE 1.37*   < > 1.31* 1.28*  CALCIUM 8.7*   < > 8.5* 8.3*  MG  --   --  1.9  --   AST 27  --   --   --   ALT 13  --   --   --   ALKPHOS 177*  --   --   --   BILITOT 1.3*  --   --   --    < > = values in this interval not displayed.   ------------------------------------------------------------------------------------------------------------------  Cardiac Enzymes No results for input(s): TROPONINI in the last 168 hours. ------------------------------------------------------------------------------------------------------------------  RADIOLOGY:  Dg Chest 2 View  Result Date: 10/15/2018 CLINICAL DATA:  Fever, history of malignancy, CAD, diabetes. Nonsmoker EXAM: CHEST - 2 VIEW COMPARISON:  Chest radiograph October 10, 2018, chest CT Aug 07, 2018 FINDINGS: Subcutaneous tunneled port catheter overlies the right chest wall with the tip in the upper SVC. No consolidation, features of edema, pneumothorax, or effusion. Pulmonary vascularity is normally distributed. The cardiomediastinal contours are unremarkable. No acute osseous or soft tissue abnormality. Degenerative changes are present in the and imaged spine  and shoulders. IMPRESSION: No active cardiopulmonary disease. Electronically Signed   By: Lovena Le M.D.   On: 10/15/2018 13:52   Dg Abd 1 View  Result Date: 10/16/2018 CLINICAL DATA:  Ileus. EXAM: ABDOMEN - 1 VIEW COMPARISON:  10/14/2018. FINDINGS: Persistent nondilated loops of small  and large bowel noted again suggesting mild adynamic ileus. No free air is identified. Degenerative change lumbar spine. Degenerative changes both hips. Surgical clips in the pelvis. IMPRESSION: Persistent nondilated loops of small large bowel again noted. This suggest mild adynamic ileus. No significant change from prior exam. Electronically Signed   By: Marcello Moores  Register   On: 10/16/2018 07:48     ASSESSMENT AND PLAN:    78 y.o. male with stage IIIb rectal adenocarcinoma currently undergoing neoadjuvant chemoradiation for significant nausea vomiting and found to have acute severe reflux esophagitis   1. Nausea and vomiting due to acute severe esophagitis:His symptoms are improving. Continue PPI and carafate. Monitor overnight to assure if he is tolerating diet for 24 hours   2. AKI: Resolved with IVF  3. Sepsis with tachycardia and elevated WBC on admission from E coli UTI: Sepsis has resolved. Continue Rocephin.  Treat for total 7 days.  4.  Stage IIIb rectal adenocarcinoma: Patient is seen Dr. Janese Banks.  5.  Essential hypertension: Continue metoprolol  6.  Diabetes: Continue sliding scale with Lantus    Management plans discussed with the patient and he is in agreement.  CODE STATUS: dnr  TOTAL TIME TAKING CARE OF THIS PATIENT: 28 minutes.     POSSIBLE D/C tomorrow, DEPENDING ON CLINICAL CONDITION.   Bettey Costa M.D on 10/16/2018 at 9:54 AM  Between 7am to 6pm - Pager - [phone] After 6pm go to [email or web address] - password [email or web address] [facility]  Office  [phone]  CC: Primary care physician; Willie Hire, MD  Note: This dictation was prepared with Dragon dictation along with smaller phrase technology. Any transcriptional errors that result from this process are unintentional.

## 2018-10-17 LAB — BASIC METABOLIC PANEL
Anion gap: 8 (ref 5–15)
BUN: 18 mg/dL (ref 8–23)
CO2: 20 mmol/L — ABNORMAL LOW (ref 22–32)
Calcium: 8.2 mg/dL — ABNORMAL LOW (ref 8.9–10.3)
Chloride: 114 mmol/L — ABNORMAL HIGH (ref 98–111)
Creatinine, Ser: 1.17 mg/dL (ref 0.61–1.24)
GFR calc Af Amer: >60 mL/min (ref >60)
GFR calc non Af Amer: 59 mL/min — ABNORMAL LOW (ref >60)
Glucose, Bld: 104 mg/dL — ABNORMAL HIGH (ref 70–99)
Potassium: 2.8 mmol/L — ABNORMAL LOW (ref 3.5–5.1)
Sodium: 142 mmol/L (ref 135–145)

## 2018-10-17 LAB — CBC
HCT: 25.6 % — ABNORMAL LOW (ref 39.0–52.0)
Hemoglobin: 8.7 g/dL — ABNORMAL LOW (ref 13.0–17.0)
MCH: 29.8 pg (ref 26.0–34.0)
MCHC: 34 g/dL (ref 30.0–36.0)
MCV: 87.7 fL (ref 80.0–100.0)
Platelets: 78 10*3/uL — ABNORMAL LOW (ref 150–400)
RBC: 2.92 MIL/uL — ABNORMAL LOW (ref 4.22–5.81)
RDW: 21.2 % — ABNORMAL HIGH (ref 11.5–15.5)
WBC: 3.1 10*3/uL — ABNORMAL LOW (ref 4.0–10.5)
nRBC: 1 % — ABNORMAL HIGH (ref 0.0–0.2)

## 2018-10-17 LAB — GLUCOSE, CAPILLARY
Glucose-Capillary: 100 mg/dL — ABNORMAL HIGH (ref 70–99)
Glucose-Capillary: 100 mg/dL — ABNORMAL HIGH (ref 70–99)

## 2018-10-17 LAB — MAGNESIUM: Magnesium: 2 mg/dL (ref 1.7–2.4)

## 2018-10-17 LAB — POTASSIUM: Potassium: 3.4 mmol/L — ABNORMAL LOW (ref 3.5–5.1)

## 2018-10-17 MED ORDER — ONDANSETRON HCL 8 MG PO TABS: 8.0000 mg | ORAL_TABLET | Freq: Three times a day (TID) | ORAL | 1 refills | Status: DC | PRN

## 2018-10-17 MED ORDER — POTASSIUM CHLORIDE 10 MEQ/100ML IV SOLN
10.0000 meq | INTRAVENOUS | Status: AC
  Administered 2018-10-17 (×4): 10 meq via INTRAVENOUS
  Filled 2018-10-17 (×4): qty 100

## 2018-10-17 MED ORDER — POTASSIUM CHLORIDE 20 MEQ PO PACK
20.0000 meq | PACK | Freq: Once | ORAL | Status: AC
  Administered 2018-10-17: 08:00:00 20 meq via ORAL
  Filled 2018-10-17: qty 1

## 2018-10-17 MED ORDER — PANTOPRAZOLE SODIUM 40 MG PO TBEC: 40.0000 mg | DELAYED_RELEASE_TABLET | Freq: Two times a day (BID) | ORAL | 0 refills | Status: DC

## 2018-10-17 MED ORDER — SUCRALFATE 1 GM/10ML PO SUSP: 1.0000 g | Freq: Three times a day (TID) | ORAL | 0 refills | Status: DC

## 2018-10-17 MED ORDER — OLANZAPINE 10 MG PO TABS: 10.0000 mg | ORAL_TABLET | Freq: Every day | ORAL | 0 refills | Status: DC

## 2018-10-17 MED ORDER — HEPARIN SOD (PORK) LOCK FLUSH 100 UNIT/ML IV SOLN
500.0000 [IU] | INTRAVENOUS | Status: AC | PRN
  Administered 2018-10-17: 500 [IU]

## 2018-10-17 MED ORDER — HEPARIN SOD (PORK) LOCK FLUSH 100 UNIT/ML IV SOLN
INTRAVENOUS | Status: AC
  Filled 2018-10-17: qty 5

## 2018-10-17 MED ORDER — HUMALOG MIX 75/25 KWIKPEN (75-25) 100 UNIT/ML ~~LOC~~ SUPN: 4.0000 [IU] | PEN_INJECTOR | SUBCUTANEOUS | 11 refills | Status: DC

## 2018-10-17 MED ORDER — SODIUM CHLORIDE 0.9% FLUSH
10.0000 mL | INTRAVENOUS | Status: AC | PRN
  Administered 2018-10-17: 10 mL

## 2018-10-17 NOTE — Progress Notes (Signed)
Pt's ride [facility]; pt discharged via wheelchair by nursing to the [facility] entrance 

## 2018-10-17 NOTE — Progress Notes (Addendum)
PHARMACY CONSULT NOTE - FOLLOW UP  Pharmacy Consult for Electrolyte Monitoring and Replacement   Recent Labs: Potassium (mmol/L)  Date Value  10/17/2018 2.8 (L)   Magnesium (mg/dL)  Date Value  10/15/2018 1.9   Calcium (mg/dL)  Date Value  10/17/2018 8.2 (L)   Albumin (g/dL)  Date Value  10/10/2018 3.7   Sodium (mmol/L)  Date Value  10/17/2018 142   Assessment: Received consult to replace electrolytes (noted above) Magnesium 8/8 = 2.0  Goal of Therapy:  Target lytes WNL  Plan:  KCL 65mEq IV q1h x 4 doses (79mEq total) KCL 11mEq po x 1 Recheck serum K+ 1 hour after last dose of KCL IV  Willie [name] ,PharmD Clinical Pharmacist 10/17/2018 7:03 AM

## 2018-10-17 NOTE — TOC Transition Note (Signed)
Transition of Care [facility]) - CM/SW Discharge Note   Patient Details  Name: Willie [name] MRN: [id number] Date of Birth: 1941-03-11  [facility] CM/SW Contact:  Weston Anna, LCSW Phone Number: 10/17/2018, 10:46 AM   Clinical Narrative:     Referral completed for [facility]- all orders placed by MD. Corene Cornea with [facility] notified of patients DC- no DME needs at this time.    Barriers to Discharge: Continued Medical Work up   Patient Goals and CMS Choice Patient states their goals for this hospitalization and ongoing recovery are:: Feel better and return home- no specific goal CMS Medicare.gov Compare Post Acute Care list provided to:: Patient Choice offered to / list presented to : Patient  Discharge Placement                       Discharge Plan and Services     Post Acute Care Choice: Home Health                               Social Determinants of Health (SDOH) Interventions     Readmission Risk Interventions No flowsheet data found.

## 2018-10-17 NOTE — Plan of Care (Signed)

## 2018-10-17 NOTE — Progress Notes (Signed)
MD order received to discharge pt home with home health today; CM/SW previously established home health with [facility]; verbally reviewed AVS with pt, gave Rxs to pt; gave out of facility DNR to pt; gave handout on Potassium content of foods to pt; no questions voiced at this time; pt's discharge pending arrival of his ride home at the medical mall entrance

## 2018-10-17 NOTE — Discharge Summary (Signed)
[facility] NAME: Willie [name]    MR#:  [id number]  DATE OF BIRTH:  1941-03-06  DATE OF ADMISSION:  10/10/2018 ADMITTING PHYSICIAN: Fritzi Mandes, MD  DATE OF DISCHARGE: 10/17/2018  PRIMARY CARE PHYSICIAN: [name], MD    ADMISSION DIAGNOSIS:  Weakness [R53.1] Generalized abdominal pain [R10.84] Urinary tract infection without hematuria, site unspecified [N39.0] Leukocytosis, unspecified type [D72.829]  DISCHARGE DIAGNOSIS:  Active Problems:   Sepsis ([place])   Protein-calorie malnutrition, severe   SECONDARY DIAGNOSIS:   Past Medical History:  Diagnosis Date  . Anemia   . Cancer ([place])   . Coronary artery disease   . Diabetes mellitus without complication ([place])   . History of hiatal hernia   . Hypercholesterolemia   . Hypertension     HOSPITAL COURSE:   78 y.o.malewith stage IIIb rectal adenocarcinoma currently undergoing neoadjuvant chemoradiation for significant nausea vomiting and found to have acute severe reflux esophagitis   1. Nausea and vomiting due to acute severe esophagitis and chemotherapy: His symptoms have improved and is tolerating diet.   He was evaluated by GI for possible ileus seen on KUB.  Patient does not have ileus.  He is continue to have bowel movements.   He will continue PPI and carafate. Dr. Janese Banks has also recommending starting Zyprexa 10 mg at night.   2. AKI: Resolved with IVF  3. Sepsis with tachycardia and elevated WBC on admission from E coli UTI: Sepsis has resolved. Continue Rocephin.  Treat for total 7 days.  4.  Stage IIIb rectal adenocarcinoma: Patient is seen Dr. Janese Banks.  5.  Essential hypertension: Continue metoprolol  6.  Diabetes: He will continue on a lower dose of outpatient regimen. 7.  Hypokalemia: This was repleted prior to discharge. He should have follow-up BMP early next week. Hypokalemia was due to poor p.o. intake with vomiting  DISCHARGE CONDITIONS  AND DIET:   Stable for discharge diabetic diet  CONSULTS OBTAINED:  Treatment Team:  Sindy Guadeloupe, MD  DRUG ALLERGIES:  No Known Allergies  DISCHARGE MEDICATIONS:   Allergies as of 10/17/2018   No Known Allergies     Medication List    STOP taking these medications   Baclofen 5 MG Tabs   dexamethasone 4 MG tablet Commonly known as: DECADRON   lisinopril 5 MG tablet Commonly known as: ZESTRIL   niacin 500 MG CR tablet Commonly known as: NIASPAN     TAKE these medications   ezetimibe 10 MG tablet Commonly known as: ZETIA Take 10 mg by mouth every morning.   HumaLOG Mix 75/25 KwikPen (75-25) 100 UNIT/ML Kwikpen Generic drug: Insulin Lispro Prot & Lispro Inject 4 Units into the skin every morning. What changed: how much to take   lidocaine-prilocaine cream Commonly known as: EMLA Apply to affected area once   metoprolol succinate 50 MG 24 hr tablet Commonly known as: TOPROL-XL Take 50 mg by mouth every morning.   nystatin 100000 UNIT/ML suspension Commonly known as: MYCOSTATIN TAKE 1 TEASPOONSFUL BY MOUTH 4 TIMES DAILY   OLANZapine 10 MG tablet Commonly known as: ZYPREXA Take 1 tablet (10 mg total) by mouth at bedtime.   ondansetron 8 MG tablet Commonly known as: Zofran Take 1 tablet (8 mg total) by mouth every 8 (eight) hours as needed for nausea, vomiting or refractory nausea / vomiting. Start on day 3 after chemotherapy. What changed:   when to take this  reasons to take this  oxyCODONE 5 MG immediate release tablet Commonly known as: Oxy IR/ROXICODONE Take 1 tablet (5 mg total) by mouth every 6 (six) hours as needed for severe pain.   pantoprazole 40 MG tablet Commonly known as: PROTONIX Take 1 tablet (40 mg total) by mouth 2 (two) times daily before a meal. What changed: when to take this   prochlorperazine 10 MG tablet Commonly known as: COMPAZINE Take 1 tablet (10 mg total) by mouth every 6 (six) hours as needed (Nausea or vomiting).    sucralfate 1 GM/10ML suspension Commonly known as: CARAFATE Take 10 mLs (1 g total) by mouth 4 (four) times daily -  with meals and at bedtime.   traZODone 50 MG tablet Commonly known as: DESYREL Take 50 mg by mouth at bedtime as needed for sleep.         Today   CHIEF COMPLAINT:  Tolerating diet   VITAL SIGNS:  Blood pressure (!) 110/51, pulse 76, temperature 98.6 F (37 C), temperature source Oral, resp. rate 18, height 5\' 3"  (1.6 m), weight 68.6 kg, SpO2 96 %.   REVIEW OF SYSTEMS:  Review of Systems  Constitutional: Negative.  Negative for chills, fever and malaise/fatigue.  HENT: Negative.  Negative for ear discharge, ear pain, hearing loss, nosebleeds and sore throat.   Eyes: Negative.  Negative for blurred vision and pain.  Respiratory: Negative.  Negative for cough, hemoptysis, shortness of breath and wheezing.   Cardiovascular: Negative.  Negative for chest pain, palpitations and leg swelling.  Gastrointestinal: Negative.  Negative for abdominal pain, blood in stool, diarrhea, nausea and vomiting.  Genitourinary: Negative.  Negative for dysuria.  Musculoskeletal: Negative.  Negative for back pain.  Skin: Negative.   Neurological: Negative for dizziness, tremors, speech change, focal weakness, seizures and headaches.  Endo/Heme/Allergies: Negative.  Does not bruise/bleed easily.  Psychiatric/Behavioral: Negative.  Negative for depression, hallucinations and suicidal ideas.     PHYSICAL EXAMINATION:  GENERAL:  78 y.o.-year-old patient lying in the bed with no acute distress.  NECK:  Supple, no jugular venous distention. No thyroid enlargement, no tenderness.  LUNGS: Normal breath sounds bilaterally, no wheezing, rales,rhonchi  No use of accessory muscles of respiration.  CARDIOVASCULAR: S1, S2 normal. No murmurs, rubs, or gallops.  ABDOMEN: Soft, non-tender, non-distended. Bowel sounds present. No organomegaly or mass.  EXTREMITIES: No pedal edema, cyanosis, or  clubbing.  PSYCHIATRIC: The patient is alert and oriented x 3.  SKIN: No obvious rash, lesion, or ulcer.   DATA REVIEW:   CBC Recent Labs  Lab 10/17/18 0618  WBC 3.1*  HGB 8.7*  HCT 25.6*  PLT 78*    Chemistries  Recent Labs  Lab 10/10/18 1455  10/17/18 0618  NA 134*   < > 142  K 4.5   < > 2.8*  CL 105   < > 114*  CO2 19*   < > 20*  GLUCOSE 293*   < > 104*  BUN 23   < > 18  CREATININE 1.37*   < > 1.17  CALCIUM 8.7*   < > 8.2*  MG  --    < > 2.0  AST 27  --   --   ALT 13  --   --   ALKPHOS 177*  --   --   BILITOT 1.3*  --   --    < > = values in this interval not displayed.    Cardiac Enzymes No results for input(s): TROPONINI in the last 168 hours.  Microbiology  Results  @MICRORSLT48 @  RADIOLOGY:  Dg Chest 2 View  Result Date: 10/15/2018 CLINICAL DATA:  Fever, history of malignancy, CAD, diabetes. Nonsmoker EXAM: CHEST - 2 VIEW COMPARISON:  Chest radiograph October 10, 2018, chest CT Aug 07, 2018 FINDINGS: Subcutaneous tunneled port catheter overlies the right chest wall with the tip in the upper SVC. No consolidation, features of edema, pneumothorax, or effusion. Pulmonary vascularity is normally distributed. The cardiomediastinal contours are unremarkable. No acute osseous or soft tissue abnormality. Degenerative changes are present in the and imaged spine and shoulders. IMPRESSION: No active cardiopulmonary disease. Electronically Signed   By: Lovena Le M.D.   On: 10/15/2018 13:52   Dg Abd 1 View  Result Date: 10/16/2018 CLINICAL DATA:  Ileus. EXAM: ABDOMEN - 1 VIEW COMPARISON:  10/14/2018. FINDINGS: Persistent nondilated loops of small and large bowel noted again suggesting mild adynamic ileus. No free air is identified. Degenerative change lumbar spine. Degenerative changes both hips. Surgical clips in the pelvis. IMPRESSION: Persistent nondilated loops of small large bowel again noted. This suggest mild adynamic ileus. No significant change from prior exam.  Electronically Signed   By: Marcello Moores  Register   On: 10/16/2018 07:48      Allergies as of 10/17/2018   No Known Allergies     Medication List    STOP taking these medications   Baclofen 5 MG Tabs   dexamethasone 4 MG tablet Commonly known as: DECADRON   lisinopril 5 MG tablet Commonly known as: ZESTRIL   niacin 500 MG CR tablet Commonly known as: NIASPAN     TAKE these medications   ezetimibe 10 MG tablet Commonly known as: ZETIA Take 10 mg by mouth every morning.   HumaLOG Mix 75/25 KwikPen (75-25) 100 UNIT/ML Kwikpen Generic drug: Insulin Lispro Prot & Lispro Inject 4 Units into the skin every morning. What changed: how much to take   lidocaine-prilocaine cream Commonly known as: EMLA Apply to affected area once   metoprolol succinate 50 MG 24 hr tablet Commonly known as: TOPROL-XL Take 50 mg by mouth every morning.   nystatin 100000 UNIT/ML suspension Commonly known as: MYCOSTATIN TAKE 1 TEASPOONSFUL BY MOUTH 4 TIMES DAILY   OLANZapine 10 MG tablet Commonly known as: ZYPREXA Take 1 tablet (10 mg total) by mouth at bedtime.   ondansetron 8 MG tablet Commonly known as: Zofran Take 1 tablet (8 mg total) by mouth every 8 (eight) hours as needed for nausea, vomiting or refractory nausea / vomiting. Start on day 3 after chemotherapy. What changed:   when to take this  reasons to take this   oxyCODONE 5 MG immediate release tablet Commonly known as: Oxy IR/ROXICODONE Take 1 tablet (5 mg total) by mouth every 6 (six) hours as needed for severe pain.   pantoprazole 40 MG tablet Commonly known as: PROTONIX Take 1 tablet (40 mg total) by mouth 2 (two) times daily before a meal. What changed: when to take this   prochlorperazine 10 MG tablet Commonly known as: COMPAZINE Take 1 tablet (10 mg total) by mouth every 6 (six) hours as needed (Nausea or vomiting).   sucralfate 1 GM/10ML suspension Commonly known as: CARAFATE Take 10 mLs (1 g total) by mouth 4  (four) times daily -  with meals and at bedtime.   traZODone 50 MG tablet Commonly known as: DESYREL Take 50 mg by mouth at bedtime as needed for sleep.         Management plans discussed with the patient and he is  in agreement. Stable for discharge   Patient should follow up with oncology  CODE STATUS:     Code Status Orders  (From admission, onward)         Start     Ordered   10/10/18 2216  Do not attempt resuscitation (DNR)  Continuous    Question Answer Comment  In the event of cardiac or respiratory ARREST Do not call a "code blue"   In the event of cardiac or respiratory ARREST Do not perform Intubation, CPR, defibrillation or ACLS   In the event of cardiac or respiratory ARREST Use medication by any route, position, wound care, and other measures to relive pain and suffering. May use oxygen, suction and manual treatment of airway obstruction as needed for comfort.   Comments d/w pt in ER      10/10/18 2215        Code Status History    Date Active Date Inactive Code Status Order ID Comments User Context   10/31/2017 2305 11/01/2017 2102 Partial Code [id number]  Mayo, Pete Pelt, MD Inpatient   Advance Care Planning Activity    Advance Directive Documentation     Most Recent Value  Type of Advance Directive  Healthcare Power of Attorney, Living will, Out of facility DNR (pink MOST or yellow form)  Pre-existing out of facility DNR order (yellow form or pink MOST form)  Physician notified to receive inpatient order  "MOST" Form in Place?  -      TOTAL TIME TAKING CARE OF THIS PATIENT: 39 minutes.    Note: This dictation was prepared with Dragon dictation along with smaller phrase technology. Any transcriptional errors that result from this process are unintentional.  Bettey Costa M.D on 10/17/2018 at 9:24 AM  Between 7am to 6pm - Pager - [phone] After 6pm go to [email or web address] - password [email or web address] [facility]  Office   [phone]  CC: Primary care physician; [name], MD

## 2018-10-17 NOTE — Discharge Instructions (Signed)
-  [facility] for home health -see separate handout "Potassium Content of Foods"

## 2018-10-19 ENCOUNTER — Inpatient Hospital Stay (HOSPITAL_BASED_OUTPATIENT_CLINIC_OR_DEPARTMENT_OTHER): Payer: Medicare Other | Admitting: Oncology

## 2018-10-19 ENCOUNTER — Telehealth: Payer: Self-pay | Admitting: *Deleted

## 2018-10-19 ENCOUNTER — Other Ambulatory Visit: Payer: Self-pay

## 2018-10-19 ENCOUNTER — Other Ambulatory Visit: Payer: Self-pay | Admitting: *Deleted

## 2018-10-19 ENCOUNTER — Inpatient Hospital Stay: Payer: Medicare Other | Attending: Oncology

## 2018-10-19 ENCOUNTER — Inpatient Hospital Stay: Payer: Medicare Other

## 2018-10-19 ENCOUNTER — Encounter: Payer: Self-pay | Admitting: Oncology

## 2018-10-19 VITALS — BP 124/76 | HR 82 | Temp 95.7°F | Resp 16 | Ht 63.0 in | Wt 144.1 lb

## 2018-10-19 DIAGNOSIS — Z5111 Encounter for antineoplastic chemotherapy: Secondary | ICD-10-CM | POA: Diagnosis not present

## 2018-10-19 DIAGNOSIS — R112 Nausea with vomiting, unspecified: Secondary | ICD-10-CM | POA: Diagnosis not present

## 2018-10-19 DIAGNOSIS — Z79899 Other long term (current) drug therapy: Secondary | ICD-10-CM | POA: Diagnosis not present

## 2018-10-19 DIAGNOSIS — K2100 Gastro-esophageal reflux disease with esophagitis, without bleeding: Secondary | ICD-10-CM

## 2018-10-19 DIAGNOSIS — T451X5A Adverse effect of antineoplastic and immunosuppressive drugs, initial encounter: Secondary | ICD-10-CM | POA: Insufficient documentation

## 2018-10-19 DIAGNOSIS — D6481 Anemia due to antineoplastic chemotherapy: Secondary | ICD-10-CM | POA: Diagnosis not present

## 2018-10-19 DIAGNOSIS — C20 Malignant neoplasm of rectum: Secondary | ICD-10-CM

## 2018-10-19 DIAGNOSIS — I10 Essential (primary) hypertension: Secondary | ICD-10-CM | POA: Insufficient documentation

## 2018-10-19 DIAGNOSIS — G893 Neoplasm related pain (acute) (chronic): Secondary | ICD-10-CM | POA: Diagnosis not present

## 2018-10-19 DIAGNOSIS — N179 Acute kidney failure, unspecified: Secondary | ICD-10-CM | POA: Diagnosis not present

## 2018-10-19 DIAGNOSIS — Z95828 Presence of other vascular implants and grafts: Secondary | ICD-10-CM

## 2018-10-19 DIAGNOSIS — I2510 Atherosclerotic heart disease of native coronary artery without angina pectoris: Secondary | ICD-10-CM | POA: Diagnosis not present

## 2018-10-19 DIAGNOSIS — R5383 Other fatigue: Secondary | ICD-10-CM | POA: Diagnosis not present

## 2018-10-19 DIAGNOSIS — C19 Malignant neoplasm of rectosigmoid junction: Secondary | ICD-10-CM | POA: Insufficient documentation

## 2018-10-19 DIAGNOSIS — E876 Hypokalemia: Secondary | ICD-10-CM | POA: Diagnosis not present

## 2018-10-19 DIAGNOSIS — R5381 Other malaise: Secondary | ICD-10-CM | POA: Diagnosis not present

## 2018-10-19 DIAGNOSIS — E119 Type 2 diabetes mellitus without complications: Secondary | ICD-10-CM | POA: Insufficient documentation

## 2018-10-19 DIAGNOSIS — E785 Hyperlipidemia, unspecified: Secondary | ICD-10-CM | POA: Diagnosis not present

## 2018-10-19 LAB — CBC WITH DIFFERENTIAL/PLATELET
Abs Immature Granulocytes: 0.65 10*3/uL — ABNORMAL HIGH (ref 0.00–0.07)
Basophils Absolute: 0.1 10*3/uL (ref 0.0–0.1)
Basophils Relative: 1 %
Eosinophils Absolute: 0 10*3/uL (ref 0.0–0.5)
Eosinophils Relative: 0 %
HCT: 29.8 % — ABNORMAL LOW (ref 39.0–52.0)
Hemoglobin: 9.9 g/dL — ABNORMAL LOW (ref 13.0–17.0)
Immature Granulocytes: 10 %
Lymphocytes Relative: 30 %
Lymphs Abs: 1.9 10*3/uL (ref 0.7–4.0)
MCH: 29.6 pg (ref 26.0–34.0)
MCHC: 33.2 g/dL (ref 30.0–36.0)
MCV: 89.2 fL (ref 80.0–100.0)
Monocytes Absolute: 0.4 10*3/uL (ref 0.1–1.0)
Monocytes Relative: 7 %
Neutro Abs: 3.3 10*3/uL (ref 1.7–7.7)
Neutrophils Relative %: 52 %
Platelets: 142 10*3/uL — ABNORMAL LOW (ref 150–400)
RBC: 3.34 MIL/uL — ABNORMAL LOW (ref 4.22–5.81)
RDW: 22.5 % — ABNORMAL HIGH (ref 11.5–15.5)
Smear Review: NORMAL
WBC Morphology: ABNORMAL
WBC: 6.4 10*3/uL (ref 4.0–10.5)
nRBC: 0.8 % — ABNORMAL HIGH (ref 0.0–0.2)

## 2018-10-19 LAB — COMPREHENSIVE METABOLIC PANEL WITH GFR
ALT: 20 U/L (ref 0–44)
AST: 28 U/L (ref 15–41)
Albumin: 3.2 g/dL — ABNORMAL LOW (ref 3.5–5.0)
Alkaline Phosphatase: 72 U/L (ref 38–126)
Anion gap: 11 (ref 5–15)
BUN: 21 mg/dL (ref 8–23)
CO2: 18 mmol/L — ABNORMAL LOW (ref 22–32)
Calcium: 8.4 mg/dL — ABNORMAL LOW (ref 8.9–10.3)
Chloride: 111 mmol/L (ref 98–111)
Creatinine, Ser: 1.67 mg/dL — ABNORMAL HIGH (ref 0.61–1.24)
GFR calc Af Amer: 44 mL/min — ABNORMAL LOW
GFR calc non Af Amer: 38 mL/min — ABNORMAL LOW
Glucose, Bld: 148 mg/dL — ABNORMAL HIGH (ref 70–99)
Potassium: 3.2 mmol/L — ABNORMAL LOW (ref 3.5–5.1)
Sodium: 140 mmol/L (ref 135–145)
Total Bilirubin: 0.6 mg/dL (ref 0.3–1.2)
Total Protein: 6 g/dL — ABNORMAL LOW (ref 6.5–8.1)

## 2018-10-19 MED ORDER — OLANZAPINE 10 MG PO TABS: 10.0000 mg | ORAL_TABLET | Freq: Every day | ORAL | 0 refills | Status: DC

## 2018-10-19 MED ORDER — HEPARIN SOD (PORK) LOCK FLUSH 100 UNIT/ML IV SOLN
500.0000 [IU] | Freq: Once | INTRAVENOUS | Status: AC
  Administered 2018-10-19: 500 [IU] via INTRAVENOUS
  Filled 2018-10-19: qty 5

## 2018-10-19 MED ORDER — PANTOPRAZOLE SODIUM 40 MG PO TBEC: 40.0000 mg | DELAYED_RELEASE_TABLET | Freq: Two times a day (BID) | ORAL | 0 refills | Status: DC

## 2018-10-19 MED ORDER — PANTOPRAZOLE SODIUM 40 MG PO TBEC: 40.0000 mg | DELAYED_RELEASE_TABLET | Freq: Two times a day (BID) | ORAL | 0 refills | Status: AC

## 2018-10-19 MED ORDER — SODIUM CHLORIDE 0.9 % IV SOLN
Freq: Once | INTRAVENOUS | Status: AC
  Administered 2018-10-19: 11:00:00 via INTRAVENOUS
  Filled 2018-10-19: qty 1000

## 2018-10-19 MED ORDER — SUCRALFATE 1 GM/10ML PO SUSP: 1.0000 g | Freq: Three times a day (TID) | ORAL | 0 refills | Status: DC

## 2018-10-19 MED ORDER — SODIUM CHLORIDE 0.9% FLUSH
10.0000 mL | Freq: Once | INTRAVENOUS | Status: AC
  Filled 2018-10-19: qty 10

## 2018-10-19 MED ORDER — NYSTATIN 100000 UNIT/ML MT SUSP: 5.0000 mL | Freq: Four times a day (QID) | OROMUCOSAL | 1 refills | Status: DC

## 2018-10-19 NOTE — Progress Notes (Signed)
Nutrition Follow-up:  Patient with stage III rectal adenocarcinoma.  Patient receiving chemotherapy.  Recent hospital admission for severe esophagitis.    Spoke with patient and nephew via phone this pm.  Patient reports that his appetite is better.  Reports that he is drinking boost glucose control about 2 per day.  Reports that he has been tolerating soups.  Nephew also on phone and reports planning baked fish tonight for dinner.     Medications: reviewed, zyprexa added  Labs: reviewed  Anthropometrics:   Weight 144 lb 1.6 oz today decreased from 158 lb on 7/13  NUTRITION DIAGNOSIS: Inadequate oral intake decreased   INTERVENTION:  Reviewed examples of foods high in protein.   Discussed ways to increase calories and protein due to weight loss     MONITORING, EVALUATION, GOAL: Patient will consume adequate calories and protein to maintain weight during treatment   NEXT VISIT: Monday, August 17th during treatment  Markeda Narvaez B. Zenia Resides, [place], [place] Registered Dietitian [phone] (pager)

## 2018-10-19 NOTE — Progress Notes (Signed)
Just got out of hosp. And his meds were not sent to pharmacy. Need carafate,  Zyprexa, and nystatin. Feeling better after getting out of hosp.

## 2018-10-19 NOTE — Telephone Encounter (Signed)
Mendel Ryder with [facility] called asking for order approval for this patient for Skilled Nursing visits 2 week 2, 1 week 4. Please advise if you will approve and sign orders

## 2018-10-20 NOTE — Telephone Encounter (Signed)
VO called to [facility], left message on voice mail

## 2018-10-20 NOTE — Telephone Encounter (Signed)
Yes I approve and will sign

## 2018-10-25 NOTE — Progress Notes (Signed)
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 He was recently found to have iron deficiency anemia which led to a colonoscopy On 07/15/2018. Colonoscopy showed a villous partially obstructing medium-sized mass in the mid sigmoid colon. The mass was partially circumferential measuring 5 cm. There was another infiltrative partially obstructing medium-sized mass found in the rectosigmoid colon which also measured 5 cm. Pathology from the sigmoid colon mass showed high-grade dysplasia at least involving an adenomatous lesion with villous features. A more serious process is not excluded. Rectosigmoid mass biopsy showed invasive colorectal adenocarcinoma.  Patient was referred to Dr. [name] for surgical management. Patient had a CT abdomen and pelvis with contrast which showed irregular asymmetric mural thickening of the proximal rectum measuring over 6 to 7 cm. Potential local nodal metastases with several subcentimeter iliac lymph nodes and presacral lymph nodes. No evidence of metastatic disease outside the pelvis.  Patient also had MRI of the pelvis with and without contrast which showed tumor length 8.5 cm with extension through muscularis propria decreased C. There is diffuse involvement of muscularis propria. No extramural vascular invasion or tumor thrombus. Shortest distance of any tumor/note from mesorectal fascia 1 to  2 mm. No involvement of adjacent organs or pelvic sidewall. Mesorectal lymph nodes within perirectal fat including 6 mm node and a low sigmoid mesocolon node measuring 6 mm. Another 6 mm node within the low sigmoid mesocolon representing extremity rectal lymphadenopathy. T3N2 by MRI. Distance from tumor to internal anal sphincter is 6.2 cm.  Plan is to proceed with total neoadjuvant chemotherapy followed by definitive surgery.   Interval history- Patint was recently admitted to the hospital for severe nausea/vomiting found to have severe reflux esophagitis.  Currently he feels he is slowly getting better and oral intake is improving. Feels fatigued  ECOG PS- 1 Pain scale- 0   Review of systems- Review of Systems  Constitutional: Positive for malaise/fatigue. Negative for chills, fever and weight loss.  HENT: Negative for congestion, ear discharge and nosebleeds.   Eyes: Negative for blurred vision.  Respiratory: Negative for cough, hemoptysis, sputum production, shortness of breath and wheezing.   Cardiovascular: Negative for chest pain, palpitations, orthopnea and claudication.  Gastrointestinal: Positive for nausea. Negative for abdominal pain, blood in stool, constipation, diarrhea, heartburn, melena and vomiting.  Genitourinary: Negative for dysuria, flank pain, frequency, hematuria and urgency.  Musculoskeletal: Negative for back pain, joint pain and myalgias.  Skin: Negative for rash.  Neurological: Negative for dizziness, tingling, focal weakness, seizures, weakness and headaches.  Endo/Heme/Allergies: Does not bruise/bleed easily.  Psychiatric/Behavioral: Negative for depression and suicidal ideas. The patient does not have insomnia.       No Known Allergies   Past Medical History:  Diagnosis Date   Anemia    Cancer ([place])    Coronary artery disease    Diabetes mellitus without complication ([place])    History of hiatal hernia    Hypercholesterolemia     Hypertension      Past Surgical  History:  Procedure Laterality Date   COLONOSCOPY WITH PROPOFOL N/A 07/15/2018   Procedure: COLONOSCOPY WITH PROPOFOL;  Surgeon: [name], [name], MD;  Location: [facility] ENDOSCOPY;  Service: Gastroenterology;  Laterality: N/A;   ESOPHAGOGASTRODUODENOSCOPY N/A 10/12/2018   Procedure: ESOPHAGOGASTRODUODENOSCOPY (EGD);  Surgeon: [name], MD;  Location[facility] ENDOSCOPY;  Service: Gastroenterology;  Laterality: N/A;   ESOPHAGOGASTRODUODENOSCOPY (EGD) WITH PROPOFOL N/A 07/15/2018   Procedure: ESOPHAGOGASTRODUODENOSCOPY (EGD) WITH PROPOFOL;  Surgeon: [name], [name], MD;  Location: [facility] ENDOSCOPY;  Service: Gastroenterology;  Laterality: N/A;   PORTACATH PLACEMENT N/A 08/13/2018   Procedure: INSERTION PORT-A-CATH;  Surgeon: [name], MD;  Location: [facility] ORS;  Service: General;  Laterality: N/A;   PROSTATE SURGERY      Social History   Socioeconomic History   Marital status: Widowed    Spouse name: Not on file   Number of children: 0   Years of education: Not on file   Highest education level: Not on file  Occupational History   Occupation: Pharmacist, hospital    Comment: Retired  Scientist, product/process development strain: Not hard at [employer] insecurity    Worry: Never true    Inability: Never true   Transportation needs    Medical: No    Non-medical: No  Tobacco Use   Smoking status: Never Smoker   Smokeless tobacco: Never Used  Substance and Sexual Activity   Alcohol use: Not Currently   Drug use: Never   Sexual activity: Not Currently  Lifestyle   Physical activity    Days per week: 2 days    Minutes per session: 30 min   Stress: Only a little  Relationships   Social connections    Talks on phone: More than three times a week    Gets together: More than three times a week    Attends religious service: Not on file    Active member of club or organization: Not on file    Attends meetings of clubs or  organizations: Not on file    Relationship status: Widowed   Intimate partner violence    Fear of current or ex partner: No    Emotionally abused: No    Physically abused: No    Forced sexual activity: No  Other Topics Concern   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  His wife was a Camera operator and they traveled frequently.  Several nieces and nephews.  He is the youngest of his brothers and sisters and only surviving.    History reviewed. No pertinent family history.   Current Outpatient Medications:    ezetimibe (ZETIA) 10 MG tablet, Take 10 mg by mouth every morning. , Disp: , Rfl:    HUMALOG MIX 75/25 KWIKPEN (75-25) 100 UNIT/ML Kwikpen, Inject 4 Units into the skin every morning., Disp: 15 mL, Rfl: 11   lidocaine-prilocaine (EMLA) cream, Apply to affected area once, Disp: 30 g, Rfl: 3   metoprolol succinate (TOPROL-XL) 50 MG 24 hr tablet, Take 50 mg by mouth every morning. , Disp: , Rfl:    nystatin (MYCOSTATIN) 100000 UNIT/ML suspension, Take 5 mLs (500,000 Units total) by mouth 4 (four) times daily., Disp: 120 mL, Rfl: 1   ondansetron (ZOFRAN) 8 MG tablet, Take 1 tablet (8 mg total) by mouth every 8 (eight) hours as needed for nausea, vomiting or refractory nausea /  vomiting. Start on day 3 after chemotherapy., Disp: 30 tablet, Rfl: 1   oxyCODONE (OXY IR/ROXICODONE) 5 MG immediate release tablet, Take 1 tablet (5 mg total) by mouth every 6 (six) hours as needed for severe pain., Disp: 60 tablet, Rfl: 0   pantoprazole (PROTONIX) 40 MG tablet, Take 1 tablet (40 mg total) by mouth 2 (two) times daily before a meal., Disp: 60 tablet, Rfl: 0   prochlorperazine (COMPAZINE) 10 MG tablet, Take 1 tablet (10 mg total) by mouth every 6 (six) hours as needed (Nausea or vomiting)., Disp: 30 tablet, Rfl: 1   traZODone (DESYREL) 50 MG tablet, Take  50 mg by mouth at bedtime as needed for sleep. , Disp: , Rfl:    OLANZapine (ZYPREXA) 10 MG tablet, Take 1 tablet (10 mg total) by mouth at bedtime., Disp: 30 tablet, Rfl: 0   sucralfate (CARAFATE) 1 GM/10ML suspension, Take 10 mLs (1 g total) by mouth 4 (four) times daily -  with meals and at bedtime., Disp: 420 mL, Rfl: 0 No current facility-administered medications for this visit.   Facility-Administered Medications Ordered in Other Visits:    sodium chloride flush (NS) 0.9 % injection 10 mL, 10 mL, Intravenous, Once, Sindy Guadeloupe, MD  Physical exam:  Vitals:   10/19/18 0906  BP: 124/76  Pulse: 82  Resp: 16  Temp: (!) 95.7 F (35.4 C)  TempSrc: Tympanic  Weight: 144 lb 1.6 oz (65.4 kg)  Height: 5\' 3"  (1.6 m)   Physical Exam HENT:     Head: Normocephalic and atraumatic.     Mouth/Throat:     Mouth: Mucous membranes are moist.     Pharynx: Oropharynx is clear.  Eyes:     Pupils: Pupils are equal, round, and reactive to light.  Neck:     Musculoskeletal: Normal range of motion.  Cardiovascular:     Rate and Rhythm: Normal rate and regular rhythm.     Heart sounds: Normal heart sounds.  Pulmonary:     Effort: Pulmonary effort is normal.     Breath sounds: Normal breath sounds.  Abdominal:     General: Bowel sounds are normal.     Palpations: Abdomen is soft.  Skin:    General: Skin is warm and dry.  Neurological:     Mental Status: He is alert and oriented to person, place, and time.      CMP Latest Ref Rng & Units 10/19/2018  Glucose 70 - 99 mg/dL 148(H)  BUN 8 - 23 mg/dL 21  Creatinine 0.61 - 1.24 mg/dL 1.67(H)  Sodium 135 - 145 mmol/L 140  Potassium 3.5 - 5.1 mmol/L 3.2(L)  Chloride 98 - 111 mmol/L 111  CO2 22 - 32 mmol/L 18(L)  Calcium 8.9 - 10.3 mg/dL 8.4(L)  Total Protein 6.5 - 8.1 g/dL 6.0(L)  Total Bilirubin 0.3 - 1.2 mg/dL 0.6  Alkaline Phos 38 - 126 U/L 72  AST 15 - 41 U/L 28  ALT 0 - 44 U/L 20   CBC Latest Ref Rng & Units 10/19/2018  WBC 4.0  - 10.5 K/uL 6.4  Hemoglobin 13.0 - 17.0 g/dL 9.9(L)  Hematocrit 39.0 - 52.0 % 29.8(L)  Platelets 150 - 400 K/uL 142(L)    No images are attached to the encounter.  Dg Chest 2 View  Result Date: 10/15/2018 CLINICAL DATA:  Fever, history of malignancy, CAD, diabetes. Nonsmoker EXAM: CHEST - 2 VIEW COMPARISON:  Chest radiograph October 10, 2018, chest CT Aug 07, 2018 FINDINGS: Subcutaneous tunneled port  catheter overlies the right chest wall with the tip in the upper SVC. No consolidation, features of edema, pneumothorax, or effusion. Pulmonary vascularity is normally distributed. The cardiomediastinal contours are unremarkable. No acute osseous or soft tissue abnormality. Degenerative changes are present in the and imaged spine and shoulders. IMPRESSION: No active cardiopulmonary disease. Electronically Signed   By: Lovena Le M.D.   On: 10/15/2018 13:52   Dg Abd 1 View  Result Date: 10/16/2018 CLINICAL DATA:  Ileus. EXAM: ABDOMEN - 1 VIEW COMPARISON:  10/14/2018. FINDINGS: Persistent nondilated loops of small and large bowel noted again suggesting mild adynamic ileus. No free air is identified. Degenerative change lumbar spine. Degenerative changes both hips. Surgical clips in the pelvis. IMPRESSION: Persistent nondilated loops of small large bowel again noted. This suggest mild adynamic ileus. No significant change from prior exam. Electronically Signed   By: [place]   On: 10/16/2018 07:48   Ct Abdomen Pelvis W Contrast  Result Date: 10/10/2018 CLINICAL DATA:  Decreased appetite, nausea. History of colon cancer. EXAM: CT ABDOMEN AND PELVIS WITH CONTRAST TECHNIQUE: Multidetector CT imaging of the abdomen and pelvis was performed using the standard protocol following bolus administration of intravenous contrast. CONTRAST:  140mL OMNIPAQUE IOHEXOL 300 MG/ML  SOLN COMPARISON:  07/23/2018 FINDINGS: Lower chest: Coronary artery calcifications. Heart is normal size. Lung bases clear. No effusions.  Hepatobiliary: Diffuse low-density throughout the liver compatible with fatty infiltration. No focal abnormality. Gallbladder unremarkable. Pancreas: No focal abnormality or ductal dilatation. Spleen: No focal abnormality.  Normal size. Adrenals/Urinary Tract: Small cyst in the lower pole of the right kidney. No hydronephrosis. No suspicious renal or adrenal lesion. Urinary bladder decompressed. A portion of the left anterior bladder wall again extends into a left inguinal hernia, stable. Stomach/Bowel: Again noted is area of wall thickening within the rectum and sigmoid colon compatible with patient's known colorectal cancer. Previously seen 2nd rounded mass within the sigmoid colon not as well visualized on today's study. I suspect1 a soft tissue density on image 50 within the sigmoid colon corresponds to the previously seen mass, but is much smaller on today's study. Extensive colonic diverticulosis. Appendix is normal. Stomach and small bowel decompressed. Vascular/Lymphatic: Aortic atherosclerosis. No aneurysm. Again noted are small lymph nodes in the pelvis in the perirectal space, stable or slightly decreased in size. No new or enlarging lymph nodes. Reproductive: Post prostatectomy. Other: No free fluid or free air. Musculoskeletal: Degenerative changes in the lumbar spine. No aggressive osseous lesions. IMPRESSION: Again noted are areas of wall thickening in the recto sigmoid colon compatible with patient's known colorectal cancer. The previously seen 2nd rounded mass in the sigmoid colon not definitively seen or smaller on today's study when compared to prior study. Diffuse colonic diverticulosis. Small perirectal lymph nodes are stable or slightly decreased in size. Mild fatty infiltration of the liver. Electronically Signed   By: Rolm Baptise M.D.   On: 10/10/2018 19:04   Dg Chest Port 1 View  Result Date: 10/10/2018 CLINICAL DATA:  Decreased appetite, nausea and mild abdominal pain x 3 days.  Instructed to come to ER for dehydration. Hx of HTN, diabetes, CAD, colon cancer, port a cath insertion 09/02/2018. Patient states that he has 2 chemotherapy treatments remaining for colon cancer and will then move on to radiation tx. EXAM: PORTABLE CHEST 1 VIEW COMPARISON:  08/13/2018 FINDINGS: Cardiac silhouette is normal in size. No mediastinal or hilar masses. No evidence of adenopathy. Lungs are clear. Right anterior chest wall internal jugular Port-A-Cath  is stable. Skeletal structures are grossly intact. IMPRESSION: No active disease. Electronically Signed   By: Lajean Manes M.D.   On: 10/10/2018 17:42   Dg Abd 2 Views  Result Date: 10/14/2018 CLINICAL DATA:  Colon cancer.  Lower abdominal pain. EXAM: ABDOMEN - 2 VIEW COMPARISON:  None. FINDINGS: Fluid levels are present in non-dilated loops of small bowel. There is no obstruction. No free air is present. IMPRESSION: Ileus pattern without for obstruction free air. Electronically Signed   By: San Morelle M.D.   On: 10/14/2018 09:38   US Abdomen Limited Ruq  Result Date: 10/14/2018 CLINICAL DATA:  Nausea and vomiting for 7 days. EXAM: ULTRASOUND ABDOMEN LIMITED RIGHT UPPER QUADRANT COMPARISON:  CT of the abdomen pelvis 10/10/2018 FINDINGS: Gallbladder: Gallbladder is contracted. Wall thickness is within normal limits. No sonographic Percell Miller sign is reported. Common bile duct: Diameter: 1 mm, within normal limits Liver: The liver is mildly echogenic. There is some loss of normal internal architecture. No discrete lesions are present. Portal vein is patent on color Doppler imaging with normal direction of blood flow towards the liver. Other: None. IMPRESSION: 1. Normal sonographic appearance of the gallbladder. No evidence for acute cholecystitis. 2. Liver is mildly echogenic, this likely reflects diffuse fatty infiltration as noted on the CT scan. Electronically Signed   By: San Morelle M.D.   On: 10/14/2018 10:06     Assessment and  plan- Patient is a 78 y.o. male with stage III rectal adencarcinoma currently undergoing neoadjuvant chemotherapy. Chemo currently on hold due to severe reflux esophagitis. This is a post hospital discharge f/u  I will continue to hold chemo this week and allow patient to recover and improve his oral intake before restarting chemo next week.  He does have evidence of AKI with an elevated creatinine of 1.6 as well as hypokalemia and we will give him 1 L of IV fluids today along with 20 mEq of IV potassium.  Patient will continue taking Protonix and Carafate for his esophagitis.  I will consider restarting chemo next week and see him in 1 week's time with CBC with differential and CMP   Visit Diagnosis 1. 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[place])   2. Nausea & vomiting      Dr. Randa Evens, MD, MPH [facility] [phone] 10/25/2018 12:14 PM

## 2018-10-26 ENCOUNTER — Inpatient Hospital Stay: Payer: Medicare Other

## 2018-10-26 ENCOUNTER — Inpatient Hospital Stay (HOSPITAL_BASED_OUTPATIENT_CLINIC_OR_DEPARTMENT_OTHER): Payer: Medicare Other | Admitting: Oncology

## 2018-10-26 ENCOUNTER — Encounter: Payer: Self-pay | Admitting: Oncology

## 2018-10-26 ENCOUNTER — Other Ambulatory Visit: Payer: Self-pay

## 2018-10-26 VITALS — BP 119/76 | HR 66 | Temp 98.3°F | Resp 16 | Ht 64.0 in | Wt 149.1 lb

## 2018-10-26 DIAGNOSIS — C20 Malignant neoplasm of rectum: Secondary | ICD-10-CM

## 2018-10-26 DIAGNOSIS — Z5111 Encounter for antineoplastic chemotherapy: Secondary | ICD-10-CM

## 2018-10-26 DIAGNOSIS — K2100 Gastro-esophageal reflux disease with esophagitis, without bleeding: Secondary | ICD-10-CM

## 2018-10-26 DIAGNOSIS — E876 Hypokalemia: Secondary | ICD-10-CM

## 2018-10-26 DIAGNOSIS — C19 Malignant neoplasm of rectosigmoid junction: Secondary | ICD-10-CM | POA: Diagnosis not present

## 2018-10-26 LAB — CBC WITH DIFFERENTIAL/PLATELET
Abs Immature Granulocytes: 0.22 10*3/uL — ABNORMAL HIGH (ref 0.00–0.07)
Basophils Absolute: 0.1 10*3/uL (ref 0.0–0.1)
Basophils Relative: 1 %
Eosinophils Absolute: 0 10*3/uL (ref 0.0–0.5)
Eosinophils Relative: 0 %
HCT: 28.6 % — ABNORMAL LOW (ref 39.0–52.0)
Hemoglobin: 9.4 g/dL — ABNORMAL LOW (ref 13.0–17.0)
Immature Granulocytes: 3 %
Lymphocytes Relative: 32 %
Lymphs Abs: 2.1 10*3/uL (ref 0.7–4.0)
MCH: 29.5 pg (ref 26.0–34.0)
MCHC: 32.9 g/dL (ref 30.0–36.0)
MCV: 89.7 fL (ref 80.0–100.0)
Monocytes Absolute: 0.6 10*3/uL (ref 0.1–1.0)
Monocytes Relative: 10 %
Neutro Abs: 3.6 10*3/uL (ref 1.7–7.7)
Neutrophils Relative %: 54 %
Platelets: 236 10*3/uL (ref 150–400)
RBC: 3.19 MIL/uL — ABNORMAL LOW (ref 4.22–5.81)
RDW: 22.5 % — ABNORMAL HIGH (ref 11.5–15.5)
WBC: 6.6 10*3/uL (ref 4.0–10.5)
nRBC: 0.5 % — ABNORMAL HIGH (ref 0.0–0.2)

## 2018-10-26 LAB — COMPREHENSIVE METABOLIC PANEL
ALT: 19 U/L (ref 0–44)
AST: 25 U/L (ref 15–41)
Albumin: 2.9 g/dL — ABNORMAL LOW (ref 3.5–5.0)
Alkaline Phosphatase: 71 U/L (ref 38–126)
Anion gap: 7 (ref 5–15)
BUN: 11 mg/dL (ref 8–23)
CO2: 25 mmol/L (ref 22–32)
Calcium: 8.3 mg/dL — ABNORMAL LOW (ref 8.9–10.3)
Chloride: 107 mmol/L (ref 98–111)
Creatinine, Ser: 1.13 mg/dL (ref 0.61–1.24)
GFR calc Af Amer: >60 mL/min (ref >60)
GFR calc non Af Amer: >60 mL/min (ref >60)
Glucose, Bld: 148 mg/dL — ABNORMAL HIGH (ref 70–99)
Potassium: 2.8 mmol/L — ABNORMAL LOW (ref 3.5–5.1)
Sodium: 139 mmol/L (ref 135–145)
Total Bilirubin: 0.3 mg/dL (ref 0.3–1.2)
Total Protein: 5.5 g/dL — ABNORMAL LOW (ref 6.5–8.1)

## 2018-10-26 MED ORDER — SODIUM CHLORIDE 0.9 % IV SOLN
2400.0000 mg/m2 | INTRAVENOUS | Status: DC
  Administered 2018-10-26: 4550 mg via INTRAVENOUS
  Filled 2018-10-26: qty 91

## 2018-10-26 MED ORDER — DEXTROSE 5 % IV SOLN
Freq: Once | INTRAVENOUS | Status: AC
  Administered 2018-10-26: 10:00:00 via INTRAVENOUS
  Filled 2018-10-26: qty 250

## 2018-10-26 MED ORDER — PALONOSETRON HCL INJECTION 0.25 MG/5ML
0.2500 mg | Freq: Once | INTRAVENOUS | Status: AC
  Administered 2018-10-26: 10:00:00 0.25 mg via INTRAVENOUS
  Filled 2018-10-26: qty 5

## 2018-10-26 MED ORDER — SODIUM CHLORIDE 0.9% FLUSH
10.0000 mL | Freq: Once | INTRAVENOUS | Status: AC
  Administered 2018-10-26: 08:00:00 10 mL via INTRAVENOUS
  Filled 2018-10-26: qty 10

## 2018-10-26 MED ORDER — LEUCOVORIN CALCIUM INJECTION 350 MG
750.0000 mg | Freq: Once | INTRAVENOUS | Status: AC
  Administered 2018-10-26: 750 mg via INTRAVENOUS
  Filled 2018-10-26: qty 17.5

## 2018-10-26 MED ORDER — POTASSIUM CHLORIDE 20 MEQ PO PACK: 20.0000 meq | PACK | Freq: Every day | ORAL | 0 refills | Status: DC

## 2018-10-26 MED ORDER — OXALIPLATIN CHEMO INJECTION 100 MG/20ML
65.0000 mg/m2 | Freq: Once | INTRAVENOUS | Status: AC
  Administered 2018-10-26: 125 mg via INTRAVENOUS
  Filled 2018-10-26: qty 20

## 2018-10-26 MED ORDER — SODIUM CHLORIDE 0.9 % IV SOLN
Freq: Once | INTRAVENOUS | Status: AC
  Administered 2018-10-26: 09:00:00 via INTRAVENOUS
  Filled 2018-10-26: qty 1000

## 2018-10-26 MED ORDER — DEXAMETHASONE SODIUM PHOSPHATE 10 MG/ML IJ SOLN
10.0000 mg | Freq: Once | INTRAMUSCULAR | Status: AC
  Administered 2018-10-26: 10 mg via INTRAVENOUS
  Filled 2018-10-26: qty 1

## 2018-10-26 NOTE — Progress Notes (Signed)
Nutrition Follow-up:  Patient with stage III rectal adenocarcinoma.  Patient receiving chemotherapy.    Met with patient during infusion.  Patient reports that his appetite is better after hospital discharge and over the past 2-3 days really has been good.  Reports yesterday for breakfast ate steak, eggs, grits, small piece of bread and orange.  Ate light lunch but can't remember what he had.  Supper was salmon cakes, watermelon and baked beans.  Reports that he is drinking boost glucose control BID. Denies issues with reflux or nausea.    Reports that he is working with PT and strength is improving.     Medications: reviewed  Labs: K 2.8, glucose 148  Anthropometrics:   Weight 149 lb 1.6 oz increased from 144 lb on 8/10  NUTRITION DIAGNOSIS: Inadequate oral intake improving   INTERVENTION:  Reviewed foods high in calories and protein with patient.  Encouraged patient to continue boost glucose control at least BID.       MONITORING, EVALUATION, GOAL: Patient will consume adequate calories and protein to maintain weight during treatment.   NEXT VISIT: Monday, August 31, during infusion  Willie [name], [place], [place] Registered Dietitian [phone] (pager)

## 2018-10-26 NOTE — Addendum Note (Signed)
Addended by: Luella Cook on: 10/26/2018 12:02 PM   Modules accepted: Orders

## 2018-10-26 NOTE — Progress Notes (Signed)
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 He was recently found to have iron deficiency anemia which led to a colonoscopy On 07/15/2018. Colonoscopy showed a villous partially obstructing medium-sized mass in the mid sigmoid colon. The mass was partially circumferential measuring 5 cm. There was another infiltrative partially obstructing medium-sized mass found in the rectosigmoid colon which also measured 5 cm. Pathology from the sigmoid colon mass showed high-grade dysplasia at least involving an adenomatous lesion with villous features. A more serious process is not excluded. Rectosigmoid mass biopsy showed invasive colorectal adenocarcinoma.  Patient was referred to Dr. [name] for surgical management. Patient had a CT abdomen and pelvis with contrast which showed irregular asymmetric mural thickening of the proximal rectum measuring over 6 to 7 cm. Potential local nodal metastases with several subcentimeter iliac lymph nodes and presacral lymph nodes. No evidence of metastatic disease outside the pelvis.  Patient also had MRI of the pelvis with and without contrast which showed tumor length 8.5 cm with extension through muscularis propria decreased C. There is diffuse involvement of muscularis propria. No extramural vascular invasion or tumor thrombus. Shortest distance of  any tumor/note from mesorectal fascia 1 to 2 mm. No involvement of adjacent organs or pelvic sidewall. Mesorectal lymph nodes within perirectal fat including 6 mm node and a low sigmoid mesocolon node measuring 6 mm. Another 6 mm node within the low sigmoid mesocolon representing extremity rectal lymphadenopathy. T3N2 by MRI. Distance from tumor to internal anal sphincter is 6.2 cm.  Plan is to proceed with total neoadjuvant chemotherapy followed by definitive surgery.  Interval history-he has been feeling better over the last 2 weeks.  He says he is eating more and is able to cook for himself.  Denies any significant nausea vomiting or abdominal pain at this time  ECOG PS- 1 Pain scale- 0 Opioid associated constipation- no  Review of systems- Review of Systems  Constitutional: Negative for chills, fever, malaise/fatigue and weight loss.  HENT: Negative for congestion, ear discharge and nosebleeds.   Eyes: Negative for blurred vision.  Respiratory: Negative for cough, hemoptysis, sputum production, shortness of breath and wheezing.   Cardiovascular: Negative for chest pain, palpitations, orthopnea and claudication.  Gastrointestinal: Negative for abdominal pain, blood in stool, constipation, diarrhea, heartburn, melena, nausea and vomiting.  Genitourinary: Negative for dysuria, flank pain, frequency, hematuria and urgency.  Musculoskeletal: Negative for back pain, joint pain and myalgias.  Skin: Negative for rash.  Neurological: Negative for dizziness, tingling, focal weakness, seizures, weakness and headaches.  Endo/Heme/Allergies: Does not bruise/bleed easily.  Psychiatric/Behavioral: Negative for depression and suicidal ideas. The patient does not have insomnia.       No Known Allergies   Past Medical History:  Diagnosis Date   Anemia    Cancer ([place])    Coronary artery disease    Diabetes mellitus without complication ([place])    History of hiatal hernia     Hypercholesterolemia    Hypertension  Past Surgical History:  Procedure Laterality Date   COLONOSCOPY WITH PROPOFOL N/A 07/15/2018   Procedure: COLONOSCOPY WITH PROPOFOL;  Surgeon: [name], [name], MD;  Location: [facility] ENDOSCOPY;  Service: Gastroenterology;  Laterality: N/A;   ESOPHAGOGASTRODUODENOSCOPY N/A 10/12/2018   Procedure: ESOPHAGOGASTRODUODENOSCOPY (EGD);  Surgeon: [name], MD;  Location: [facility] ENDOSCOPY;  Service: Gastroenterology;  Laterality: N/A;   ESOPHAGOGASTRODUODENOSCOPY (EGD) WITH PROPOFOL N/A 07/15/2018   Procedure: ESOPHAGOGASTRODUODENOSCOPY (EGD) WITH PROPOFOL;  Surgeon: [name], [name], MD;  Location: [facility] ENDOSCOPY;  Service: Gastroenterology;  Laterality: N/A;   PORTACATH PLACEMENT N/A 08/13/2018   Procedure: INSERTION PORT-A-CATH;  Surgeon: [name], MD;  Location: [facility] ORS;  Service: General;  Laterality: N/A;   PROSTATE SURGERY      Social History   Socioeconomic History   Marital status: Widowed    Spouse name: Not on file   Number of children: 0   Years of education: Not on file   Highest education level: Not on file  Occupational History   Occupation: Pharmacist, hospital    Comment: Retired  Scientist, product/process development strain: Not hard at [employer]    Worry: Never true    Inability: Never true   Transportation needs    Medical: No    Non-medical: No  Tobacco Use   Smoking status: Never Smoker   Smokeless tobacco: Never Used  Substance and Sexual Activity   Alcohol use: Not Currently   Drug use: Never   Sexual activity: Not Currently  Lifestyle   Physical activity    Days per week: 2 days    Minutes per session: 30 min   Stress: Only a little  Relationships   Social connections    Talks on phone: More than three times a week    Gets together: More than three times a week    Attends religious service: Not on file    Active member of club or organization: Not on file    Attends  meetings of clubs or organizations: Not on file    Relationship status: Widowed   Intimate partner violence    Fear of current or ex partner: No    Emotionally abused: No    Physically abused: No    Forced sexual activity: No  Other Topics Concern   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  His wife was a Camera operator and they traveled frequently.  Several nieces and nephews.  He is the youngest of his brothers and sisters and only surviving.    No family history on file.   Current Outpatient Medications:    ezetimibe (ZETIA) 10 MG tablet, Take 10 mg by mouth every morning. , Disp: , Rfl:    HUMALOG MIX 75/25 KWIKPEN (75-25) 100 UNIT/ML Kwikpen, Inject 4 Units into the skin every morning., Disp: 15 mL, Rfl: 11   lidocaine-prilocaine (EMLA) cream, Apply to affected area once, Disp: 30 g, Rfl: 3   metoprolol succinate (TOPROL-XL) 50 MG 24 hr tablet, Take 50 mg by mouth every morning. , Disp: , Rfl:    nystatin (MYCOSTATIN) 100000 UNIT/ML suspension, Take 5 mLs (500,000 Units total) by mouth 4 (four) times daily., Disp: 120 mL, Rfl: 1   OLANZapine (ZYPREXA) 10 MG tablet, Take 1 tablet (10 mg total) by mouth at bedtime., Disp: 30 tablet, Rfl: 0   ondansetron (ZOFRAN) 8  MG tablet, Take 1 tablet (8 mg total) by mouth every 8 (eight) hours as needed for nausea, vomiting or refractory nausea / vomiting. Start on day 3 after chemotherapy., Disp: 30 tablet, Rfl: 1   oxyCODONE (OXY IR/ROXICODONE) 5 MG immediate release tablet, Take 1 tablet (5 mg total) by mouth every 6 (six) hours as needed for severe pain., Disp: 60 tablet, Rfl: 0   pantoprazole (PROTONIX) 40 MG tablet, Take 1 tablet (40 mg total) by mouth 2 (two) times daily before a meal., Disp: 60 tablet, Rfl: 0   prochlorperazine (COMPAZINE) 10 MG tablet, Take 1 tablet (10 mg total) by mouth every  6 (six) hours as needed (Nausea or vomiting)., Disp: 30 tablet, Rfl: 1   sucralfate (CARAFATE) 1 GM/10ML suspension, Take 10 mLs (1 g total) by mouth 4 (four) times daily -  with meals and at bedtime., Disp: 420 mL, Rfl: 0   traZODone (DESYREL) 50 MG tablet, Take 50 mg by mouth at bedtime as needed for sleep. , Disp: , Rfl:  No current facility-administered medications for this visit.   Facility-Administered Medications Ordered in Other Visits:    sodium chloride flush (NS) 0.9 % injection 10 mL, 10 mL, Intravenous, Once, Sindy Guadeloupe, MD  Physical exam:  Vitals:   10/26/18 0834  BP: 119/76  Pulse: 66  Resp: 16  Temp: 98.3 F (36.8 C)  TempSrc: Tympanic  Weight: 149 lb 1.6 oz (67.6 kg)  Height: 5\' 4"  (1.626 m)   Physical Exam HENT:     Head: Normocephalic and atraumatic.  Eyes:     Pupils: Pupils are equal, round, and reactive to light.  Neck:     Musculoskeletal: Normal range of motion.  Cardiovascular:     Rate and Rhythm: Normal rate and regular rhythm.     Heart sounds: Normal heart sounds.  Pulmonary:     Effort: Pulmonary effort is normal.     Breath sounds: Normal breath sounds.  Abdominal:     General: Bowel sounds are normal.     Palpations: Abdomen is soft.  Skin:    General: Skin is warm and dry.  Neurological:     Mental Status: He is alert and oriented to person, place, and time.      CMP Latest Ref Rng & Units 10/26/2018  Glucose 70 - 99 mg/dL 148(H)  BUN 8 - 23 mg/dL 11  Creatinine 0.61 - 1.24 mg/dL 1.13  Sodium 135 - 145 mmol/L 139  Potassium 3.5 - 5.1 mmol/L 2.8(L)  Chloride 98 - 111 mmol/L 107  CO2 22 - 32 mmol/L 25  Calcium 8.9 - 10.3 mg/dL 8.3(L)  Total Protein 6.5 - 8.1 g/dL 5.5(L)  Total Bilirubin 0.3 - 1.2 mg/dL 0.3  Alkaline Phos 38 - 126 U/L 71  AST 15 - 41 U/L 25  ALT 0 - 44 U/L 19   CBC Latest Ref Rng & Units 10/26/2018  WBC 4.0 - 10.5 K/uL 6.6  Hemoglobin 13.0 - 17.0 g/dL 9.4(L)  Hematocrit 39.0 - 52.0 % 28.6(L)  Platelets  150 - 400 K/uL 236    No images are attached to the encounter.  Dg Chest 2 View  Result Date: 10/15/2018 CLINICAL DATA:  Fever, history of malignancy, CAD, diabetes. Nonsmoker EXAM: CHEST - 2 VIEW COMPARISON:  Chest radiograph October 10, 2018, chest CT Aug 07, 2018 FINDINGS: Subcutaneous tunneled port catheter overlies the right chest wall with the tip in the upper SVC. No consolidation, features of edema, pneumothorax, or effusion. Pulmonary  vascularity is normally distributed. The cardiomediastinal contours are unremarkable. No acute osseous or soft tissue abnormality. Degenerative changes are present in the and imaged spine and shoulders. IMPRESSION: No active cardiopulmonary disease. Electronically Signed   By: Lovena Le M.D.   On: 10/15/2018 13:52   Dg Abd 1 View  Result Date: 10/16/2018 CLINICAL DATA:  Ileus. EXAM: ABDOMEN - 1 VIEW COMPARISON:  10/14/2018. FINDINGS: Persistent nondilated loops of small and large bowel noted again suggesting mild adynamic ileus. No free air is identified. Degenerative change lumbar spine. Degenerative changes both hips. Surgical clips in the pelvis. IMPRESSION: Persistent nondilated loops of small large bowel again noted. This suggest mild adynamic ileus. No significant change from prior exam. Electronically Signed   By: [place]   On: 10/16/2018 07:48   Ct Abdomen Pelvis W Contrast  Result Date: 10/10/2018 CLINICAL DATA:  Decreased appetite, nausea. History of colon cancer. EXAM: CT ABDOMEN AND PELVIS WITH CONTRAST TECHNIQUE: Multidetector CT imaging of the abdomen and pelvis was performed using the standard protocol following bolus administration of intravenous contrast. CONTRAST:  155mL OMNIPAQUE IOHEXOL 300 MG/ML  SOLN COMPARISON:  07/23/2018 FINDINGS: Lower chest: Coronary artery calcifications. Heart is normal size. Lung bases clear. No effusions. Hepatobiliary: Diffuse low-density throughout the liver compatible with fatty infiltration. No focal  abnormality. Gallbladder unremarkable. Pancreas: No focal abnormality or ductal dilatation. Spleen: No focal abnormality.  Normal size. Adrenals/Urinary Tract: Small cyst in the lower pole of the right kidney. No hydronephrosis. No suspicious renal or adrenal lesion. Urinary bladder decompressed. A portion of the left anterior bladder wall again extends into a left inguinal hernia, stable. Stomach/Bowel: Again noted is area of wall thickening within the rectum and sigmoid colon compatible with patient's known colorectal cancer. Previously seen 2nd rounded mass within the sigmoid colon not as well visualized on today's study. I suspect1 a soft tissue density on image 50 within the sigmoid colon corresponds to the previously seen mass, but is much smaller on today's study. Extensive colonic diverticulosis. Appendix is normal. Stomach and small bowel decompressed. Vascular/Lymphatic: Aortic atherosclerosis. No aneurysm. Again noted are small lymph nodes in the pelvis in the perirectal space, stable or slightly decreased in size. No new or enlarging lymph nodes. Reproductive: Post prostatectomy. Other: No free fluid or free air. Musculoskeletal: Degenerative changes in the lumbar spine. No aggressive osseous lesions. IMPRESSION: Again noted are areas of wall thickening in the recto sigmoid colon compatible with patient's known colorectal cancer. The previously seen 2nd rounded mass in the sigmoid colon not definitively seen or smaller on today's study when compared to prior study. Diffuse colonic diverticulosis. Small perirectal lymph nodes are stable or slightly decreased in size. Mild fatty infiltration of the liver. Electronically Signed   By: Rolm Baptise M.D.   On: 10/10/2018 19:04   Dg Chest Port 1 View  Result Date: 10/10/2018 CLINICAL DATA:  Decreased appetite, nausea and mild abdominal pain x 3 days. Instructed to come to ER for dehydration. Hx of HTN, diabetes, CAD, colon cancer, port a cath insertion  09/02/2018. Patient states that he has 2 chemotherapy treatments remaining for colon cancer and will then move on to radiation tx. EXAM: PORTABLE CHEST 1 VIEW COMPARISON:  08/13/2018 FINDINGS: Cardiac silhouette is normal in size. No mediastinal or hilar masses. No evidence of adenopathy. Lungs are clear. Right anterior chest wall internal jugular Port-A-Cath is stable. Skeletal structures are grossly intact. IMPRESSION: No active disease. Electronically Signed   By: Dedra Skeens.D.  On: 10/10/2018 17:42   Dg Abd 2 Views  Result Date: 10/14/2018 CLINICAL DATA:  Colon cancer.  Lower abdominal pain. EXAM: ABDOMEN - 2 VIEW COMPARISON:  None. FINDINGS: Fluid levels are present in non-dilated loops of small bowel. There is no obstruction. No free air is present. IMPRESSION: Ileus pattern without for obstruction free air. Electronically Signed   By: San Morelle M.D.   On: 10/14/2018 09:38   US Abdomen Limited Ruq  Result Date: 10/14/2018 CLINICAL DATA:  Nausea and vomiting for 7 days. EXAM: ULTRASOUND ABDOMEN LIMITED RIGHT UPPER QUADRANT COMPARISON:  CT of the abdomen pelvis 10/10/2018 FINDINGS: Gallbladder: Gallbladder is contracted. Wall thickness is within normal limits. No sonographic Percell Miller sign is reported. Common bile duct: Diameter: 1 mm, within normal limits Liver: The liver is mildly echogenic. There is some loss of normal internal architecture. No discrete lesions are present. Portal vein is patent on color Doppler imaging with normal direction of blood flow towards the liver. Other: None. IMPRESSION: 1. Normal sonographic appearance of the gallbladder. No evidence for acute cholecystitis. 2. Liver is mildly echogenic, this likely reflects diffuse fatty infiltration as noted on the CT scan. Electronically Signed   By: San Morelle M.D.   On: 10/14/2018 10:06     Assessment and plan- Patient is a 78 y.o. male with stage III rectal adencarcinoma currently undergoing neoadjuvant  chemotherapy.  He is here for on treatment assessment prior to cycle 5 of neoadjuvant FOLFOX chemotherapy  1.  Reflux esophagitis: Patient's chemotherapy has been on hold for the last 3 weeks due to his recent hospitalization from reflux esophagitis.  Patient will continue to take Protonix and Carafate  2.  Counts are okay to proceed with cycle 5 of neoadjuvant FOLFOX chemotherapy today he is getting oxaliplatin at reduced dose of 65 mg per metered square.  Given his significant nausea vomiting from chemotherapy which I think is also secondary to his reflux esophagitis, I am eliminating the 5-FU bolus at this time and he will only continue to get infusional 5-FU chemotherapy.  Patient does get Udenyca on day 3 of pump disconnect  3.  Hypokalemia: We will give him 20 mEq of IV potassium today along with 1 L of IV fluids and he will get the same on day 3 of pump disconnect.  Potassium tablets can worsen his esophagitis and I will therefore try to give him potassium liquid or powder for his hypokalemia  4.  AKI: Resolved  I will see him back in 2 weeks time with CBC with differential, CMP and CEA for cycle 6 of chemotherapy   Visit Diagnosis 1. 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[place])   2. Encounter for antineoplastic chemotherapy   3. Reflux esophagitis      Dr. Randa Evens, MD, MPH [facility] [phone] 10/26/2018 8:52 AM

## 2018-10-26 NOTE — Progress Notes (Signed)
Pt inc. Wt. Since last visit.

## 2018-10-27 ENCOUNTER — Other Ambulatory Visit: Payer: Self-pay | Admitting: *Deleted

## 2018-10-27 DIAGNOSIS — E876 Hypokalemia: Secondary | ICD-10-CM | POA: Insufficient documentation

## 2018-10-28 ENCOUNTER — Inpatient Hospital Stay: Payer: Medicare Other

## 2018-10-28 ENCOUNTER — Other Ambulatory Visit: Payer: Self-pay

## 2018-10-28 VITALS — BP 113/62 | HR 55 | Temp 96.2°F | Resp 24

## 2018-10-28 DIAGNOSIS — E876 Hypokalemia: Secondary | ICD-10-CM

## 2018-10-28 DIAGNOSIS — C20 Malignant neoplasm of rectum: Secondary | ICD-10-CM

## 2018-10-28 DIAGNOSIS — C19 Malignant neoplasm of rectosigmoid junction: Secondary | ICD-10-CM | POA: Diagnosis not present

## 2018-10-28 MED ORDER — HEPARIN SOD (PORK) LOCK FLUSH 100 UNIT/ML IV SOLN
500.0000 [IU] | Freq: Once | INTRAVENOUS | Status: AC | PRN
  Administered 2018-10-28: 500 [IU]
  Filled 2018-10-28: qty 5

## 2018-10-28 MED ORDER — PEGFILGRASTIM-CBQV 6 MG/0.6ML ~~LOC~~ SOSY
6.0000 mg | PREFILLED_SYRINGE | Freq: Once | SUBCUTANEOUS | Status: AC
  Administered 2018-10-28: 6 mg via SUBCUTANEOUS
  Filled 2018-10-28: qty 0.6

## 2018-10-28 MED ORDER — SODIUM CHLORIDE 0.9% FLUSH
10.0000 mL | INTRAVENOUS | Status: DC | PRN
  Filled 2018-10-28: qty 10

## 2018-10-28 MED ORDER — SODIUM CHLORIDE 0.9 % IV SOLN
Freq: Once | INTRAVENOUS | Status: AC
  Administered 2018-10-28: 11:00:00 via INTRAVENOUS
  Filled 2018-10-28: qty 1000

## 2018-11-06 ENCOUNTER — Other Ambulatory Visit: Payer: Self-pay | Admitting: Oncology

## 2018-11-06 DIAGNOSIS — C20 Malignant neoplasm of rectum: Secondary | ICD-10-CM

## 2018-11-09 ENCOUNTER — Encounter: Payer: Self-pay | Admitting: Oncology

## 2018-11-09 ENCOUNTER — Inpatient Hospital Stay: Payer: Medicare Other

## 2018-11-09 ENCOUNTER — Other Ambulatory Visit: Payer: Self-pay

## 2018-11-09 ENCOUNTER — Inpatient Hospital Stay (HOSPITAL_BASED_OUTPATIENT_CLINIC_OR_DEPARTMENT_OTHER): Payer: Medicare Other | Admitting: Oncology

## 2018-11-09 VITALS — BP 120/76 | HR 56 | Temp 95.1°F | Resp 16 | Ht 64.0 in | Wt 149.2 lb

## 2018-11-09 DIAGNOSIS — T451X5A Adverse effect of antineoplastic and immunosuppressive drugs, initial encounter: Secondary | ICD-10-CM

## 2018-11-09 DIAGNOSIS — K210 Gastro-esophageal reflux disease with esophagitis: Secondary | ICD-10-CM

## 2018-11-09 DIAGNOSIS — D6481 Anemia due to antineoplastic chemotherapy: Secondary | ICD-10-CM

## 2018-11-09 DIAGNOSIS — Z5111 Encounter for antineoplastic chemotherapy: Secondary | ICD-10-CM

## 2018-11-09 DIAGNOSIS — R5381 Other malaise: Secondary | ICD-10-CM

## 2018-11-09 DIAGNOSIS — E876 Hypokalemia: Secondary | ICD-10-CM

## 2018-11-09 DIAGNOSIS — C20 Malignant neoplasm of rectum: Secondary | ICD-10-CM

## 2018-11-09 DIAGNOSIS — G893 Neoplasm related pain (acute) (chronic): Secondary | ICD-10-CM

## 2018-11-09 DIAGNOSIS — Z95828 Presence of other vascular implants and grafts: Secondary | ICD-10-CM

## 2018-11-09 DIAGNOSIS — R5383 Other fatigue: Secondary | ICD-10-CM

## 2018-11-09 DIAGNOSIS — C19 Malignant neoplasm of rectosigmoid junction: Secondary | ICD-10-CM | POA: Diagnosis not present

## 2018-11-09 LAB — COMPREHENSIVE METABOLIC PANEL
ALT: 18 U/L (ref 0–44)
AST: 26 U/L (ref 15–41)
Albumin: 3.2 g/dL — ABNORMAL LOW (ref 3.5–5.0)
Alkaline Phosphatase: 116 U/L (ref 38–126)
Anion gap: 5 (ref 5–15)
BUN: 7 mg/dL — ABNORMAL LOW (ref 8–23)
CO2: 27 mmol/L (ref 22–32)
Calcium: 8.8 mg/dL — ABNORMAL LOW (ref 8.9–10.3)
Chloride: 109 mmol/L (ref 98–111)
Creatinine, Ser: 1.15 mg/dL (ref 0.61–1.24)
GFR calc Af Amer: >60 mL/min (ref >60)
GFR calc non Af Amer: 60 mL/min — ABNORMAL LOW (ref >60)
Glucose, Bld: 136 mg/dL — ABNORMAL HIGH (ref 70–99)
Potassium: 3.6 mmol/L (ref 3.5–5.1)
Sodium: 141 mmol/L (ref 135–145)
Total Bilirubin: 0.4 mg/dL (ref 0.3–1.2)
Total Protein: 6.1 g/dL — ABNORMAL LOW (ref 6.5–8.1)

## 2018-11-09 LAB — CBC WITH DIFFERENTIAL/PLATELET
Abs Immature Granulocytes: 0.81 10*3/uL — ABNORMAL HIGH (ref 0.00–0.07)
Basophils Absolute: 0.1 10*3/uL (ref 0.0–0.1)
Basophils Relative: 1 %
Eosinophils Absolute: 0.1 10*3/uL (ref 0.0–0.5)
Eosinophils Relative: 1 %
HCT: 30.4 % — ABNORMAL LOW (ref 39.0–52.0)
Hemoglobin: 9.9 g/dL — ABNORMAL LOW (ref 13.0–17.0)
Immature Granulocytes: 7 %
Lymphocytes Relative: 23 %
Lymphs Abs: 2.6 10*3/uL (ref 0.7–4.0)
MCH: 30.5 pg (ref 26.0–34.0)
MCHC: 32.6 g/dL (ref 30.0–36.0)
MCV: 93.5 fL (ref 80.0–100.0)
Monocytes Absolute: 0.9 10*3/uL (ref 0.1–1.0)
Monocytes Relative: 8 %
Neutro Abs: 7 10*3/uL (ref 1.7–7.7)
Neutrophils Relative %: 60 %
Platelets: 160 10*3/uL (ref 150–400)
RBC: 3.25 MIL/uL — ABNORMAL LOW (ref 4.22–5.81)
RDW: 23 % — ABNORMAL HIGH (ref 11.5–15.5)
WBC: 11.5 10*3/uL — ABNORMAL HIGH (ref 4.0–10.5)
nRBC: 0.9 % — ABNORMAL HIGH (ref 0.0–0.2)

## 2018-11-09 MED ORDER — DEXTROSE 5 % IV SOLN
Freq: Once | INTRAVENOUS | Status: AC
  Administered 2018-11-09: 09:00:00 via INTRAVENOUS
  Filled 2018-11-09: qty 250

## 2018-11-09 MED ORDER — SODIUM CHLORIDE 0.9 % IV SOLN
2400.0000 mg/m2 | INTRAVENOUS | Status: DC
  Administered 2018-11-09: 4550 mg via INTRAVENOUS
  Filled 2018-11-09: qty 91

## 2018-11-09 MED ORDER — PALONOSETRON HCL INJECTION 0.25 MG/5ML
0.2500 mg | Freq: Once | INTRAVENOUS | Status: AC
  Administered 2018-11-09: 0.25 mg via INTRAVENOUS
  Filled 2018-11-09: qty 5

## 2018-11-09 MED ORDER — SODIUM CHLORIDE 0.9% FLUSH
10.0000 mL | Freq: Once | INTRAVENOUS | Status: AC
  Filled 2018-11-09: qty 10

## 2018-11-09 MED ORDER — LEUCOVORIN CALCIUM INJECTION 350 MG
395.0000 mg/m2 | Freq: Once | INTRAVENOUS | Status: AC
  Administered 2018-11-09: 10:00:00 750 mg via INTRAVENOUS
  Filled 2018-11-09: qty 25

## 2018-11-09 MED ORDER — DEXAMETHASONE SODIUM PHOSPHATE 10 MG/ML IJ SOLN
10.0000 mg | Freq: Once | INTRAMUSCULAR | Status: AC
  Administered 2018-11-09: 09:00:00 10 mg via INTRAVENOUS
  Filled 2018-11-09: qty 1

## 2018-11-09 MED ORDER — OXALIPLATIN CHEMO INJECTION 100 MG/20ML
65.0000 mg/m2 | Freq: Once | INTRAVENOUS | Status: AC
  Administered 2018-11-09: 10:00:00 125 mg via INTRAVENOUS
  Filled 2018-11-09: qty 20

## 2018-11-09 MED ORDER — SODIUM CHLORIDE 0.9% FLUSH
10.0000 mL | Freq: Once | INTRAVENOUS | Status: AC
  Administered 2018-11-09: 10 mL via INTRAVENOUS
  Filled 2018-11-09: qty 10

## 2018-11-09 NOTE — Progress Notes (Signed)
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 He was recently found to have iron deficiency anemia which led to a colonoscopy On 07/15/2018. Colonoscopy showed a villous partially obstructing medium-sized mass in the mid sigmoid colon. The mass was partially circumferential measuring 5 cm. There was another infiltrative partially obstructing medium-sized mass found in the rectosigmoid colon which also measured 5 cm. Pathology from the sigmoid colon mass showed high-grade dysplasia at least involving an adenomatous lesion with villous features. A more serious process is not excluded. Rectosigmoid mass biopsy showed invasive colorectal adenocarcinoma.  Patient was referred to Dr. [name] for surgical management. Patient had a CT abdomen and pelvis with contrast which showed irregular asymmetric mural thickening of the proximal rectum measuring over 6 to 7 cm. Potential local nodal metastases with several subcentimeter iliac lymph nodes and presacral lymph nodes. No evidence of metastatic disease outside the pelvis.  Patient also had MRI of the pelvis with and without contrast which showed tumor length 8.5 cm with extension through muscularis propria decreased C. There is diffuse involvement of muscularis propria. No extramural vascular invasion or tumor thrombus. Shortest distance of  any tumor/note from mesorectal fascia 1 to 2 mm. No involvement of adjacent organs or pelvic sidewall. Mesorectal lymph nodes within perirectal fat including 6 mm node and a low sigmoid mesocolon node measuring 6 mm. Another 6 mm node within the low sigmoid mesocolon representing extremity rectal lymphadenopathy. T3N2 by MRI. Distance from tumor to internal anal sphincter is 6.2 cm.  Plan is to proceed with total neoadjuvant chemotherapy followed by definitive surgery.  Interval history-he is feeling well overall.  Denies any significant nausea vomiting or abdominal pain.  Oral intake has been good.  Rectal pain is well controlled  ECOG PS- 1 Pain scale- 0 Opioid associated constipation- no  Review of systems- Review of Systems  Constitutional: Positive for malaise/fatigue. Negative for chills, fever and weight loss.  HENT: Negative for congestion, ear discharge and nosebleeds.   Eyes: Negative for blurred vision.  Respiratory: Negative for cough, hemoptysis, sputum production, shortness of breath and wheezing.   Cardiovascular: Negative for chest pain, palpitations, orthopnea and claudication.  Gastrointestinal: Negative for abdominal pain, blood in stool, constipation, diarrhea, heartburn, melena, nausea and vomiting.  Genitourinary: Negative for dysuria, flank pain, frequency, hematuria and urgency.  Musculoskeletal: Negative for back pain, joint pain and myalgias.  Skin: Negative for rash.  Neurological: Negative for dizziness, tingling, focal weakness, seizures, weakness and headaches.  Endo/Heme/Allergies: Does not bruise/bleed easily.  Psychiatric/Behavioral: Negative for depression and suicidal ideas. The patient does not have insomnia.        No Known Allergies   Past Medical History:  Diagnosis Date   Anemia    Cancer ([place])    Coronary artery disease    Diabetes mellitus without complication ([place])    History of hiatal hernia    Hypercholesterolemia     Hypertension      Past Surgical History:  Procedure Laterality Date   COLONOSCOPY WITH PROPOFOL N/A 07/15/2018   Procedure: COLONOSCOPY WITH PROPOFOL;  Surgeon: [name], [name], MD;  Location: [facility] ENDOSCOPY;  Service: Gastroenterology;  Laterality: N/A;   ESOPHAGOGASTRODUODENOSCOPY N/A 10/12/2018   Procedure: ESOPHAGOGASTRODUODENOSCOPY (EGD);  Surgeon: [name], MD;  Location: [facility] ENDOSCOPY;  Service: Gastroenterology;  Laterality: N/A;   ESOPHAGOGASTRODUODENOSCOPY (EGD) WITH PROPOFOL N/A 07/15/2018   Procedure: ESOPHAGOGASTRODUODENOSCOPY (EGD) WITH PROPOFOL;  Surgeon: [name], [name], MD;  Location: [facility] ENDOSCOPY;  Service: Gastroenterology;  Laterality: N/A;   PORTACATH PLACEMENT N/A 08/13/2018   Procedure: INSERTION PORT-A-CATH;  Surgeon: [name], MD;  Location: [facility] ORS;  Service: General;  Laterality: N/A;   PROSTATE SURGERY      Social History   Socioeconomic History   Marital status: Widowed    Spouse name: Not on file   Number of children: 0   Years of education: Not on file   Highest education level: Not on file  Occupational History   Occupation: Pharmacist, hospital    Comment: Retired  Scientist, product/process development strain: Not hard at [employer] insecurity    Worry: Never true    Inability: Never true   Transportation needs    Medical: No    Non-medical: No  Tobacco Use   Smoking status: Never Smoker   Smokeless tobacco: Never Used  Substance and Sexual Activity   Alcohol use: Not Currently   Drug use: Never   Sexual activity: Not Currently  Lifestyle   Physical activity    Days per week: 2 days    Minutes per session: 30 min   Stress: Only a little  Relationships   Social connections    Talks on phone: More than three times a week    Gets together: More than three times a week    Attends religious service: Not on file    Active member of club or organization: Not on file    Attends meetings of clubs or  organizations: Not on file    Relationship status: Widowed   Intimate partner violence    Fear of current or ex partner: No    Emotionally abused: No    Physically abused: No    Forced sexual activity: No  Other Topics Concern   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  His wife was a Camera operator and they traveled frequently.  Several nieces and nephews.  He is the youngest of his brothers and sisters and only surviving.    No family history on file.   Current Outpatient Medications:    ezetimibe (ZETIA) 10 MG tablet, Take 10 mg by mouth every morning. , Disp: , Rfl:    HUMALOG MIX 75/25 KWIKPEN (75-25) 100 UNIT/ML Kwikpen, Inject 4 Units into the skin every morning., Disp: 15 mL, Rfl: 11   lidocaine-prilocaine (EMLA) cream, Apply to affected area once, Disp: 30 g, Rfl: 3   metoprolol succinate (TOPROL-XL) 50 MG 24 hr tablet, Take 50 mg by mouth every morning. , Disp: , Rfl:    nystatin (MYCOSTATIN) 100000 UNIT/ML suspension, Take 5 mLs (500,000 Units total) by mouth 4 (four) times daily., Disp: 120 mL, Rfl: 1   OLANZapine (ZYPREXA) 10 MG tablet, Take 1 tablet (10 mg total) by mouth at bedtime., Disp: 30 tablet, Rfl: 0   ondansetron (ZOFRAN) 8 MG tablet, TAKE 1  TABLET BY MOUTH TWICE DAILY AS NEEDED REFRACTORY NAUSEA/VOMITING START ON DAY 3 AFTER CHEMOTHERAPY, Disp: 30 tablet, Rfl: 1   oxyCODONE (OXY IR/ROXICODONE) 5 MG immediate release tablet, Take 1 tablet (5 mg total) by mouth every 6 (six) hours as needed for severe pain., Disp: 60 tablet, Rfl: 0   pantoprazole (PROTONIX) 40 MG tablet, Take 1 tablet (40 mg total) by mouth 2 (two) times daily before a meal., Disp: 60 tablet, Rfl: 0   potassium chloride (KLOR-CON) 20 MEQ packet, Take 20 mEq by mouth daily., Disp: 14 packet, Rfl: 0   prochlorperazine (COMPAZINE) 10 MG tablet, TAKE 1 TABLET  BY MOUTH EVERY 6 HOURS AS NEEDED NAUSEA AND VOMITING, Disp: 30 tablet, Rfl: 1   sucralfate (CARAFATE) 1 GM/10ML suspension, Take 10 mLs (1 g total) by mouth 4 (four) times daily -  with meals and at bedtime., Disp: 420 mL, Rfl: 0   traZODone (DESYREL) 50 MG tablet, Take 50 mg by mouth at bedtime as needed for sleep. , Disp: , Rfl:  No current facility-administered medications for this visit.   Facility-Administered Medications Ordered in Other Visits:    dexamethasone (DECADRON) injection 10 mg, 10 mg, Intravenous, Once, Sindy Guadeloupe, MD   fluorouracil (ADRUCIL) 4,550 mg in sodium chloride 0.9 % 59 mL chemo infusion, 2,400 mg/m2 (Treatment Plan Recorded), Intravenous, 1 day or 1 dose, Sindy Guadeloupe, MD   leucovorin 760 mg in dextrose 5 % 250 mL infusion, 400 mg/m2 (Treatment Plan Recorded), Intravenous, Once, Sindy Guadeloupe, MD   oxaliplatin (ELOXATIN) 125 mg in dextrose 5 % 500 mL chemo infusion, 65 mg/m2 (Treatment Plan Recorded), Intravenous, Once, Sindy Guadeloupe, MD   palonosetron (ALOXI) injection 0.25 mg, 0.25 mg, Intravenous, Once, Sindy Guadeloupe, MD   sodium chloride flush (NS) 0.9 % injection 10 mL, 10 mL, Intravenous, Once, Sindy Guadeloupe, MD   sodium chloride flush (NS) 0.9 % injection 10 mL, 10 mL, Intravenous, Once, Sindy Guadeloupe, MD  Physical exam:  Vitals:   11/09/18 0832  BP: 120/76  Pulse: (!) 56  Resp: 16  Temp: (!) 95.1 F (35.1 C)  TempSrc: Tympanic  Weight: 149 lb 3.2 oz (67.7 kg)  Height: 5\' 4"  (1.626 m)   Physical Exam Constitutional:      General: He is not in acute distress. HENT:     Head: Normocephalic and atraumatic.  Eyes:     Pupils: Pupils are equal, round, and reactive to light.  Neck:     Musculoskeletal: Normal range of motion.  Cardiovascular:     Rate and Rhythm: Normal rate and regular rhythm.     Heart sounds: Normal heart sounds.  Pulmonary:     Effort: Pulmonary effort is normal.     Breath sounds: Normal breath sounds.    Abdominal:     General: Bowel sounds are normal.     Palpations: Abdomen is soft.  Skin:    General: Skin is warm and dry.  Neurological:     Mental Status: He is alert and oriented to person, place, and time.      CMP Latest Ref Rng & Units 11/09/2018  Glucose 70 - 99 mg/dL 136(H)  BUN 8 - 23 mg/dL 7(L)  Creatinine 0.61 - 1.24 mg/dL 1.15  Sodium 135 - 145 mmol/L 141  Potassium 3.5 - 5.1 mmol/L 3.6  Chloride 98 - 111 mmol/L 109  CO2 22 - 32 mmol/L 27  Calcium 8.9 - 10.3 mg/dL 8.8(L)  Total Protein 6.5 -  8.1 g/dL 6.1(L)  Total Bilirubin 0.3 - 1.2 mg/dL 0.4  Alkaline Phos 38 - 126 U/L 116  AST 15 - 41 U/L 26  ALT 0 - 44 U/L 18   CBC Latest Ref Rng & Units 11/09/2018  WBC 4.0 - 10.5 K/uL 11.5(H)  Hemoglobin 13.0 - 17.0 g/dL 9.9(L)  Hematocrit 39.0 - 52.0 % 30.4(L)  Platelets 150 - 400 K/uL 160    No images are attached to the encounter.  Dg Chest 2 View  Result Date: 10/15/2018 CLINICAL DATA:  Fever, history of malignancy, CAD, diabetes. Nonsmoker EXAM: CHEST - 2 VIEW COMPARISON:  Chest radiograph October 10, 2018, chest CT Aug 07, 2018 FINDINGS: Subcutaneous tunneled port catheter overlies the right chest wall with the tip in the upper SVC. No consolidation, features of edema, pneumothorax, or effusion. Pulmonary vascularity is normally distributed. The cardiomediastinal contours are unremarkable. No acute osseous or soft tissue abnormality. Degenerative changes are present in the and imaged spine and shoulders. IMPRESSION: No active cardiopulmonary disease. Electronically Signed   By: Lovena Le M.D.   On: 10/15/2018 13:52   Dg Abd 1 View  Result Date: 10/16/2018 CLINICAL DATA:  Ileus. EXAM: ABDOMEN - 1 VIEW COMPARISON:  10/14/2018. FINDINGS: Persistent nondilated loops of small and large bowel noted again suggesting mild adynamic ileus. No free air is identified. Degenerative change lumbar spine. Degenerative changes both hips. Surgical clips in the pelvis. IMPRESSION:  Persistent nondilated loops of small large bowel again noted. This suggest mild adynamic ileus. No significant change from prior exam. Electronically Signed   By: [place]   On: 10/16/2018 07:48   Ct Abdomen Pelvis W Contrast  Result Date: 10/10/2018 CLINICAL DATA:  Decreased appetite, nausea. History of colon cancer. EXAM: CT ABDOMEN AND PELVIS WITH CONTRAST TECHNIQUE: Multidetector CT imaging of the abdomen and pelvis was performed using the standard protocol following bolus administration of intravenous contrast. CONTRAST:  136mL OMNIPAQUE IOHEXOL 300 MG/ML  SOLN COMPARISON:  07/23/2018 FINDINGS: Lower chest: Coronary artery calcifications. Heart is normal size. Lung bases clear. No effusions. Hepatobiliary: Diffuse low-density throughout the liver compatible with fatty infiltration. No focal abnormality. Gallbladder unremarkable. Pancreas: No focal abnormality or ductal dilatation. Spleen: No focal abnormality.  Normal size. Adrenals/Urinary Tract: Small cyst in the lower pole of the right kidney. No hydronephrosis. No suspicious renal or adrenal lesion. Urinary bladder decompressed. A portion of the left anterior bladder wall again extends into a left inguinal hernia, stable. Stomach/Bowel: Again noted is area of wall thickening within the rectum and sigmoid colon compatible with patient's known colorectal cancer. Previously seen 2nd rounded mass within the sigmoid colon not as well visualized on today's study. I suspect1 a soft tissue density on image 50 within the sigmoid colon corresponds to the previously seen mass, but is much smaller on today's study. Extensive colonic diverticulosis. Appendix is normal. Stomach and small bowel decompressed. Vascular/Lymphatic: Aortic atherosclerosis. No aneurysm. Again noted are small lymph nodes in the pelvis in the perirectal space, stable or slightly decreased in size. No new or enlarging lymph nodes. Reproductive: Post prostatectomy. Other: No free fluid  or free air. Musculoskeletal: Degenerative changes in the lumbar spine. No aggressive osseous lesions. IMPRESSION: Again noted are areas of wall thickening in the recto sigmoid colon compatible with patient's known colorectal cancer. The previously seen 2nd rounded mass in the sigmoid colon not definitively seen or smaller on today's study when compared to prior study. Diffuse colonic diverticulosis. Small perirectal lymph nodes are stable or slightly  decreased in size. Mild fatty infiltration of the liver. Electronically Signed   By: Rolm Baptise M.D.   On: 10/10/2018 19:04   Dg Chest Port 1 View  Result Date: 10/10/2018 CLINICAL DATA:  Decreased appetite, nausea and mild abdominal pain x 3 days. Instructed to come to ER for dehydration. Hx of HTN, diabetes, CAD, colon cancer, port a cath insertion 09/02/2018. Patient states that he has 2 chemotherapy treatments remaining for colon cancer and will then move on to radiation tx. EXAM: PORTABLE CHEST 1 VIEW COMPARISON:  08/13/2018 FINDINGS: Cardiac silhouette is normal in size. No mediastinal or hilar masses. No evidence of adenopathy. Lungs are clear. Right anterior chest wall internal jugular Port-A-Cath is stable. Skeletal structures are grossly intact. IMPRESSION: No active disease. Electronically Signed   By: Lajean Manes M.D.   On: 10/10/2018 17:42   Dg Abd 2 Views  Result Date: 10/14/2018 CLINICAL DATA:  Colon cancer.  Lower abdominal pain. EXAM: ABDOMEN - 2 VIEW COMPARISON:  None. FINDINGS: Fluid levels are present in non-dilated loops of small bowel. There is no obstruction. No free air is present. IMPRESSION: Ileus pattern without for obstruction free air. Electronically Signed   By: San Morelle M.D.   On: 10/14/2018 09:38   US Abdomen Limited Ruq  Result Date: 10/14/2018 CLINICAL DATA:  Nausea and vomiting for 7 days. EXAM: ULTRASOUND ABDOMEN LIMITED RIGHT UPPER QUADRANT COMPARISON:  CT of the abdomen pelvis 10/10/2018 FINDINGS:  Gallbladder: Gallbladder is contracted. Wall thickness is within normal limits. No sonographic Percell Miller sign is reported. Common bile duct: Diameter: 1 mm, within normal limits Liver: The liver is mildly echogenic. There is some loss of normal internal architecture. No discrete lesions are present. Portal vein is patent on color Doppler imaging with normal direction of blood flow towards the liver. Other: None. IMPRESSION: 1. Normal sonographic appearance of the gallbladder. No evidence for acute cholecystitis. 2. Liver is mildly echogenic, this likely reflects diffuse fatty infiltration as noted on the CT scan. Electronically Signed   By: San Morelle M.D.   On: 10/14/2018 10:06     Assessment and plan- Patient is a 78 y.o. male  with stage III rectal adencarcinoma currently undergoing neoadjuvant chemotherapy.   He is here for on treatment assessment prior to cycle 6 of neoadjuvant FOLFOX chemotherapy  Problems okay to proceed with cycle 6 of neoadjuvant FOLFOX chemotherapy today.  He will come back on day 3 for pump disconnect and received Udenyca on that day.  I will see him back in 2 weeks time with CBC with differential, CMP for cycle 7.  He then gets CT abdomen pelvis with contrast when he was admitted to the hospital for nausea vomiting and reflux esophagitis and at that time was found to have response to treatment.  I will therefore plan to repeat scans after 8 cycles  Reflux esophagitis: Continue Protonix and Carafate  Hypokalemia: Resolved  Chemo induced anemia: Mild continue to monitor   Visit Diagnosis 1. 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[place])   2. Encounter for antineoplastic chemotherapy   3. Antineoplastic chemotherapy induced anemia      Dr. Randa Evens, MD, MPH [facility] [id number] 11/09/2018 9:20 AM

## 2018-11-09 NOTE — Progress Notes (Signed)
Pt doing good, cold sensitivity goes away in several days, appetite good per pt. Wt. stable

## 2018-11-09 NOTE — Progress Notes (Signed)
Nutrition Follow-up:  Patient with stage III rectal adenocarcinoma.  Patient receiving chemotherapy.    Met with patient during infusion.  Patient reports that his appetite continues to be good after the first week of chemotherapy.  Reports that his nephew is a Art gallery manager and is preparing meals for him.  He eats breakfast and supper and snacks during the day.  Drinks 2 boost glucose shakes daily.  Denies issues with nausea, bowels are moving normally.      Medications: reviewed  Labs: glucose 136  Anthropometrics:   Weight is stable at 149 lb 3.2 oz    NUTRITION DIAGNOSIS: Inadequate oral intake improving   INTERVENTION:  Reviewed foods high in calories and protein with patient.   Encouraged boost glucose control TID    MONITORING, EVALUATION, GOAL: Patient will consume adequate calories and protein to prevent further weight loss   NEXT VISIT: Monday, Sept 14 during infusion  Lucia Mccreadie B. Zenia Resides, [place], [place] Registered Dietitian [phone] (pager)

## 2018-11-10 LAB — CEA: CEA: 2.4 ng/mL (ref 0.0–4.7)

## 2018-11-11 ENCOUNTER — Other Ambulatory Visit: Payer: Self-pay

## 2018-11-11 ENCOUNTER — Inpatient Hospital Stay: Payer: Medicare Other | Attending: Oncology

## 2018-11-11 ENCOUNTER — Ambulatory Visit: Payer: Medicare Other

## 2018-11-11 VITALS — BP 116/66 | HR 74 | Temp 97.0°F | Resp 18

## 2018-11-11 DIAGNOSIS — T451X5A Adverse effect of antineoplastic and immunosuppressive drugs, initial encounter: Secondary | ICD-10-CM | POA: Diagnosis not present

## 2018-11-11 DIAGNOSIS — R5383 Other fatigue: Secondary | ICD-10-CM | POA: Diagnosis not present

## 2018-11-11 DIAGNOSIS — E785 Hyperlipidemia, unspecified: Secondary | ICD-10-CM | POA: Insufficient documentation

## 2018-11-11 DIAGNOSIS — D6481 Anemia due to antineoplastic chemotherapy: Secondary | ICD-10-CM | POA: Insufficient documentation

## 2018-11-11 DIAGNOSIS — Z5111 Encounter for antineoplastic chemotherapy: Secondary | ICD-10-CM | POA: Diagnosis not present

## 2018-11-11 DIAGNOSIS — I2510 Atherosclerotic heart disease of native coronary artery without angina pectoris: Secondary | ICD-10-CM | POA: Insufficient documentation

## 2018-11-11 DIAGNOSIS — D696 Thrombocytopenia, unspecified: Secondary | ICD-10-CM | POA: Insufficient documentation

## 2018-11-11 DIAGNOSIS — E119 Type 2 diabetes mellitus without complications: Secondary | ICD-10-CM | POA: Insufficient documentation

## 2018-11-11 DIAGNOSIS — Z79899 Other long term (current) drug therapy: Secondary | ICD-10-CM | POA: Insufficient documentation

## 2018-11-11 DIAGNOSIS — Z7689 Persons encountering health services in other specified circumstances: Secondary | ICD-10-CM | POA: Diagnosis not present

## 2018-11-11 DIAGNOSIS — C20 Malignant neoplasm of rectum: Secondary | ICD-10-CM

## 2018-11-11 DIAGNOSIS — E876 Hypokalemia: Secondary | ICD-10-CM | POA: Diagnosis not present

## 2018-11-11 DIAGNOSIS — I10 Essential (primary) hypertension: Secondary | ICD-10-CM | POA: Insufficient documentation

## 2018-11-11 DIAGNOSIS — R5381 Other malaise: Secondary | ICD-10-CM | POA: Diagnosis not present

## 2018-11-11 DIAGNOSIS — Z23 Encounter for immunization: Secondary | ICD-10-CM | POA: Insufficient documentation

## 2018-11-11 DIAGNOSIS — K210 Gastro-esophageal reflux disease with esophagitis: Secondary | ICD-10-CM | POA: Insufficient documentation

## 2018-11-11 MED ORDER — HEPARIN SOD (PORK) LOCK FLUSH 100 UNIT/ML IV SOLN
500.0000 [IU] | Freq: Once | INTRAVENOUS | Status: AC | PRN
  Administered 2018-11-11: 500 [IU]
  Filled 2018-11-11: qty 5

## 2018-11-11 MED ORDER — PEGFILGRASTIM-CBQV 6 MG/0.6ML ~~LOC~~ SOSY
6.0000 mg | PREFILLED_SYRINGE | Freq: Once | SUBCUTANEOUS | Status: AC
  Administered 2018-11-11: 6 mg via SUBCUTANEOUS

## 2018-11-20 ENCOUNTER — Encounter: Payer: Self-pay | Admitting: Oncology

## 2018-11-20 ENCOUNTER — Other Ambulatory Visit: Payer: Self-pay

## 2018-11-20 NOTE — Progress Notes (Signed)
Called patient left message

## 2018-11-20 NOTE — Progress Notes (Signed)
Patient would like a refill on his Oxycodone, Olanzapine, Nystatin and Carafate. Patient stated that he had been doing well overall.

## 2018-11-23 ENCOUNTER — Inpatient Hospital Stay: Payer: Medicare Other

## 2018-11-23 ENCOUNTER — Other Ambulatory Visit: Payer: Self-pay

## 2018-11-23 ENCOUNTER — Inpatient Hospital Stay (HOSPITAL_BASED_OUTPATIENT_CLINIC_OR_DEPARTMENT_OTHER): Payer: Medicare Other | Admitting: Oncology

## 2018-11-23 ENCOUNTER — Other Ambulatory Visit: Payer: Self-pay | Admitting: *Deleted

## 2018-11-23 VITALS — BP 118/68 | HR 67 | Temp 98.2°F | Resp 16 | Ht 64.0 in | Wt 147.7 lb

## 2018-11-23 DIAGNOSIS — D6959 Other secondary thrombocytopenia: Secondary | ICD-10-CM | POA: Diagnosis not present

## 2018-11-23 DIAGNOSIS — Z5111 Encounter for antineoplastic chemotherapy: Secondary | ICD-10-CM

## 2018-11-23 DIAGNOSIS — E876 Hypokalemia: Secondary | ICD-10-CM

## 2018-11-23 DIAGNOSIS — T451X5A Adverse effect of antineoplastic and immunosuppressive drugs, initial encounter: Secondary | ICD-10-CM | POA: Diagnosis not present

## 2018-11-23 DIAGNOSIS — C20 Malignant neoplasm of rectum: Secondary | ICD-10-CM

## 2018-11-23 DIAGNOSIS — K2100 Gastro-esophageal reflux disease with esophagitis, without bleeding: Secondary | ICD-10-CM

## 2018-11-23 LAB — CBC WITH DIFFERENTIAL/PLATELET
Abs Immature Granulocytes: 0.07 10*3/uL (ref 0.00–0.07)
Basophils Absolute: 0.1 10*3/uL (ref 0.0–0.1)
Basophils Relative: 1 %
Eosinophils Absolute: 0.1 10*3/uL (ref 0.0–0.5)
Eosinophils Relative: 1 %
HCT: 30.9 % — ABNORMAL LOW (ref 39.0–52.0)
Hemoglobin: 10.1 g/dL — ABNORMAL LOW (ref 13.0–17.0)
Immature Granulocytes: 1 %
Lymphocytes Relative: 25 %
Lymphs Abs: 1.6 10*3/uL (ref 0.7–4.0)
MCH: 30.9 pg (ref 26.0–34.0)
MCHC: 32.7 g/dL (ref 30.0–36.0)
MCV: 94.5 fL (ref 80.0–100.0)
Monocytes Absolute: 0.5 10*3/uL (ref 0.1–1.0)
Monocytes Relative: 8 %
Neutro Abs: 4 10*3/uL (ref 1.7–7.7)
Neutrophils Relative %: 64 %
Platelets: 104 10*3/uL — ABNORMAL LOW (ref 150–400)
RBC: 3.27 MIL/uL — ABNORMAL LOW (ref 4.22–5.81)
RDW: 20.5 % — ABNORMAL HIGH (ref 11.5–15.5)
WBC: 6.3 10*3/uL (ref 4.0–10.5)
nRBC: 0 % (ref 0.0–0.2)

## 2018-11-23 LAB — COMPREHENSIVE METABOLIC PANEL
ALT: 15 U/L (ref 0–44)
AST: 22 U/L (ref 15–41)
Albumin: 3.3 g/dL — ABNORMAL LOW (ref 3.5–5.0)
Alkaline Phosphatase: 108 U/L (ref 38–126)
Anion gap: 6 (ref 5–15)
BUN: 9 mg/dL (ref 8–23)
CO2: 24 mmol/L (ref 22–32)
Calcium: 8.7 mg/dL — ABNORMAL LOW (ref 8.9–10.3)
Chloride: 110 mmol/L (ref 98–111)
Creatinine, Ser: 1.02 mg/dL (ref 0.61–1.24)
GFR calc Af Amer: >60 mL/min (ref >60)
GFR calc non Af Amer: >60 mL/min (ref >60)
Glucose, Bld: 135 mg/dL — ABNORMAL HIGH (ref 70–99)
Potassium: 3.2 mmol/L — ABNORMAL LOW (ref 3.5–5.1)
Sodium: 140 mmol/L (ref 135–145)
Total Bilirubin: 0.3 mg/dL (ref 0.3–1.2)
Total Protein: 6.2 g/dL — ABNORMAL LOW (ref 6.5–8.1)

## 2018-11-23 MED ORDER — DEXTROSE 5 % IV SOLN
Freq: Once | INTRAVENOUS | Status: AC
  Administered 2018-11-23: 09:00:00 via INTRAVENOUS
  Filled 2018-11-23: qty 250

## 2018-11-23 MED ORDER — SODIUM CHLORIDE 0.9% FLUSH
10.0000 mL | INTRAVENOUS | Status: DC | PRN
  Administered 2018-11-23: 14:00:00 10 mL
  Filled 2018-11-23: qty 10

## 2018-11-23 MED ORDER — OXALIPLATIN CHEMO INJECTION 100 MG/20ML
65.0000 mg/m2 | Freq: Once | INTRAVENOUS | Status: AC
  Administered 2018-11-23: 12:00:00 125 mg via INTRAVENOUS
  Filled 2018-11-23: qty 20

## 2018-11-23 MED ORDER — SODIUM CHLORIDE 0.9% FLUSH
10.0000 mL | Freq: Once | INTRAVENOUS | Status: AC
  Administered 2018-11-23: 08:00:00 10 mL via INTRAVENOUS
  Filled 2018-11-23: qty 10

## 2018-11-23 MED ORDER — SODIUM CHLORIDE 0.9 % IV SOLN
2400.0000 mg/m2 | INTRAVENOUS | Status: DC
  Administered 2018-11-23: 14:00:00 4550 mg via INTRAVENOUS
  Filled 2018-11-23: qty 91

## 2018-11-23 MED ORDER — LEUCOVORIN CALCIUM INJECTION 350 MG
395.0000 mg/m2 | Freq: Once | INTRAVENOUS | Status: AC
  Administered 2018-11-23: 750 mg via INTRAVENOUS
  Filled 2018-11-23: qty 25

## 2018-11-23 MED ORDER — DEXAMETHASONE SODIUM PHOSPHATE 10 MG/ML IJ SOLN
10.0000 mg | Freq: Once | INTRAMUSCULAR | Status: AC
  Administered 2018-11-23: 11:00:00 10 mg via INTRAVENOUS
  Filled 2018-11-23: qty 1

## 2018-11-23 MED ORDER — PALONOSETRON HCL INJECTION 0.25 MG/5ML
0.2500 mg | Freq: Once | INTRAVENOUS | Status: AC
  Administered 2018-11-23: 0.25 mg via INTRAVENOUS
  Filled 2018-11-23: qty 5

## 2018-11-23 MED ORDER — NYSTATIN 100000 UNIT/ML MT SUSP: 5.0000 mL | Freq: Four times a day (QID) | OROMUCOSAL | 1 refills | Status: DC

## 2018-11-23 MED ORDER — OLANZAPINE 10 MG PO TABS: 10.0000 mg | ORAL_TABLET | Freq: Every day | ORAL | 0 refills | Status: DC

## 2018-11-23 MED ORDER — SUCRALFATE 1 GM/10ML PO SUSP: 1.0000 g | Freq: Three times a day (TID) | ORAL | 0 refills | Status: DC

## 2018-11-23 MED ORDER — HEPARIN SOD (PORK) LOCK FLUSH 100 UNIT/ML IV SOLN: 500.0000 [IU] | Freq: Once | INTRAVENOUS | Status: DC

## 2018-11-23 MED ORDER — POTASSIUM CHLORIDE IN NACL 20-0.9 MEQ/L-% IV SOLN
INTRAVENOUS | Status: DC
  Administered 2018-11-23: 10:00:00 1000 mL via INTRAVENOUS
  Filled 2018-11-23 (×2): qty 1000

## 2018-11-23 MED ORDER — SODIUM CHLORIDE 0.9 % IV SOLN: Freq: Once | INTRAVENOUS | Status: DC

## 2018-11-23 MED ORDER — OXYCODONE HCL 5 MG PO TABS: 5.0000 mg | ORAL_TABLET | Freq: Four times a day (QID) | ORAL | 0 refills | Status: DC | PRN

## 2018-11-23 NOTE — Progress Notes (Signed)
Nutrition Follow-up:  Patient with stage III rectal adenocarcinoma.  Patient receiving chemotherapy.   Spoke with patient during infusion.  Patient reports that appetite continues to be good.  Doesn't understand why lost weight this time.  Reports that he continues to drink boost glucose control BID.  Reports that he has taste alterations.  Reports that he tries to eat 3 meals per day and snack in between.  LIkes to snack on fruit and jello    Medications: reviewed  Labs: reviewed  Anthropometrics:   Weight 147 lb 3.2 oz today from 149 lb 3.2 oz on 8/31   NUTRITION DIAGNOSIS:  Inadequate oral intake stable    INTERVENTION:  Discussed higher calorie, higher protein snacks that patient can eat to prevent weight from decreasing.  Encouraged boost glucose control TID Patient has contact information    MONITORING, EVALUATION, GOAL: Patient will consume adequate calories and protein to prevent further weight loss   NEXT VISIT: Monday, Sept 28 during infusion  Katrisha Segall B. Zenia Resides, [place], [place] Registered Dietitian [phone] (pager)

## 2018-11-23 NOTE — Progress Notes (Signed)
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 He was recently found to have iron deficiency anemia which led to a colonoscopy On 07/15/2018. Colonoscopy showed a villous partially obstructing medium-sized mass in the mid sigmoid colon. The mass was partially circumferential measuring 5 cm. There was another infiltrative partially obstructing medium-sized mass found in the rectosigmoid colon which also measured 5 cm. Pathology from the sigmoid colon mass showed high-grade dysplasia at least involving an adenomatous lesion with villous features. A more serious process is not excluded. Rectosigmoid mass biopsy showed invasive colorectal adenocarcinoma.  Patient was referred to Dr. [name] for surgical management. Patient had a CT abdomen and pelvis with contrast which showed irregular asymmetric mural thickening of the proximal rectum measuring over 6 to 7 cm. Potential local nodal metastases with several subcentimeter iliac lymph nodes and presacral lymph nodes. No evidence of metastatic disease outside the pelvis.  Patient also had MRI of the pelvis with and without contrast which showed tumor length 8.5 cm with extension through muscularis propria decreased C. There is diffuse involvement of muscularis propria. No extramural vascular invasion or tumor thrombus. Shortest distance of  any tumor/note from mesorectal fascia 1 to 2 mm. No involvement of adjacent organs or pelvic sidewall. Mesorectal lymph nodes within perirectal fat including 6 mm node and a low sigmoid mesocolon node measuring 6 mm. Another 6 mm node within the low sigmoid mesocolon representing extremity rectal lymphadenopathy. T3N2 by MRI. Distance from tumor to internal anal sphincter is 6.2 cm.  Plan is to proceed with total neoadjuvant chemotherapy followed by definitive surgery.  Interval history-he reports tolerating chemotherapy well.  He does have some ongoing fatigue.  Denies any nausea or vomiting.  He is able to swallow without having significant pain.  Rectal pain has improved.  He is still on oral oxycodone  ECOG PS- 1 Pain scale- 0 Opioid associated constipation- no  Review of systems- Review of Systems  Constitutional: Positive for malaise/fatigue. Negative for chills, fever and weight loss.  HENT: Negative for congestion, ear discharge and nosebleeds.   Eyes: Negative for blurred vision.  Respiratory: Negative for cough, hemoptysis, sputum production, shortness of breath and wheezing.   Cardiovascular: Negative for chest pain, palpitations, orthopnea and claudication.  Gastrointestinal: Negative for abdominal pain, blood in stool, constipation, diarrhea, heartburn, melena, nausea and vomiting.  Genitourinary: Negative for dysuria, flank pain, frequency, hematuria and urgency.  Musculoskeletal: Negative for back pain, joint pain and myalgias.  Skin: Negative for rash.  Neurological: Negative for dizziness, tingling, focal weakness, seizures, weakness and headaches.  Endo/Heme/Allergies: Does not bruise/bleed easily.  Psychiatric/Behavioral: Negative for depression and suicidal ideas. The patient does not have insomnia.        No Known Allergies   Past Medical History:  Diagnosis Date  . Anemia   . Cancer ([place])   . Coronary artery disease   . Diabetes mellitus without  complication ([place])   . History of hiatal hernia   .  Hypercholesterolemia   . Hypertension      Past Surgical History:  Procedure Laterality Date  . COLONOSCOPY WITH PROPOFOL N/A 07/15/2018   Procedure: COLONOSCOPY WITH PROPOFOL;  Surgeon: [name], [name], MD;  Location: [facility] ENDOSCOPY;  Service: Gastroenterology;  Laterality: N/A;  . ESOPHAGOGASTRODUODENOSCOPY N/A 10/12/2018   Procedure: ESOPHAGOGASTRODUODENOSCOPY (EGD);  Surgeon: [name], MD;  Location: [facility] ENDOSCOPY;  Service: Gastroenterology;  Laterality: N/A;  . ESOPHAGOGASTRODUODENOSCOPY (EGD) WITH PROPOFOL N/A 07/15/2018   Procedure: ESOPHAGOGASTRODUODENOSCOPY (EGD) WITH PROPOFOL;  Surgeon: [name], [name], MD;  Location: [facility] ENDOSCOPY;  Service: Gastroenterology;  Laterality: N/A;  . PORTACATH PLACEMENT N/A 08/13/2018   Procedure: INSERTION PORT-A-CATH;  Surgeon: [name], MD;  Location: [facility] ORS;  Service: General;  Laterality: N/A;  . PROSTATE SURGERY      Social History   Socioeconomic History  . Marital status: Widowed    Spouse name: Not on file  . Number of children: 0  . Years of education: Not on file  . Highest education level: Not on file  Occupational History  . Occupation: Pharmacist, hospital    Comment: Retired  Scientific laboratory technician  . Financial resource strain: Not hard at all  . Food insecurity    Worry: Never true    Inability: Never true  . Transportation needs    Medical: No    Non-medical: No  Tobacco Use  . Smoking status: Never Smoker  . Smokeless tobacco: Never Used  Substance and Sexual Activity  . Alcohol use: Not Currently  . Drug use: Never  . Sexual activity: Not Currently  Lifestyle  . Physical activity    Days per week: 2 days    Minutes per session: 30 min  . Stress: Only a little  Relationships  . Social connections    Talks on phone: More than three times a week    Gets together: More than three times a week    Attends religious service: Not on file    Active member of  club or organization: Not on file    Attends meetings of clubs or organizations: Not on file    Relationship status: Widowed  . Intimate partner violence    Fear of current or ex partner: No    Emotionally abused: No    Physically abused: No    Forced sexual activity: No  Other Topics Concern  . 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  His wife was a Camera operator and they traveled frequently.  Several nieces and nephews.  He is the youngest of his brothers and sisters and only surviving.    History reviewed. No pertinent family history.   Current Outpatient Medications:  .  baclofen (LIORESAL) 10 MG tablet, Take 1 tablet by mouth 1 day or 1 dose., Disp: , Rfl:  .  ezetimibe (ZETIA) 10 MG tablet, Take 10 mg by mouth every morning. , Disp: , Rfl:  .  HUMALOG MIX 75/25 KWIKPEN (75-25) 100 UNIT/ML Kwikpen, Inject 4 Units into the skin every morning., Disp: 15 mL, Rfl: 11 .  lidocaine-prilocaine (EMLA) cream, Apply to affected area once, Disp: 30 g, Rfl: 3 .  metoprolol succinate (TOPROL-XL) 50 MG 24 hr tablet, Take 1 tablet by mouth 1 day or 1 dose., Disp: , Rfl:  .  nystatin (MYCOSTATIN) 100000 UNIT/ML suspension, Take 5 mLs (500,000 Units total) by mouth 4 (four)  times daily., Disp: 120 mL, Rfl: 1 .  OLANZapine (ZYPREXA) 10 MG tablet, Take 1 tablet (10 mg total) by mouth at bedtime., Disp: 30 tablet, Rfl: 0 .  ondansetron (ZOFRAN) 8 MG tablet, TAKE 1 TABLET BY MOUTH TWICE DAILY AS NEEDED REFRACTORY NAUSEA/VOMITING START ON DAY 3 AFTER CHEMOTHERAPY, Disp: 30 tablet, Rfl: 1 .  pantoprazole (PROTONIX) 40 MG tablet, Take 1 tablet (40 mg total) by mouth 2 (two) times daily before a meal., Disp: 60 tablet, Rfl: 0 .  potassium chloride (KLOR-CON) 20 MEQ packet, Take 20 mEq by mouth daily., Disp: 14 packet, Rfl: 0 .  prochlorperazine (COMPAZINE) 10 MG tablet,  TAKE 1 TABLET BY MOUTH EVERY 6 HOURS AS NEEDED NAUSEA AND VOMITING, Disp: 30 tablet, Rfl: 1 .  sucralfate (CARAFATE) 1 GM/10ML suspension, Take 10 mLs (1 g total) by mouth 4 (four) times daily -  with meals and at bedtime., Disp: 420 mL, Rfl: 0 .  traZODone (DESYREL) 50 MG tablet, Take 50 mg by mouth at bedtime as needed for sleep. , Disp: , Rfl:  .  oxyCODONE (OXY IR/ROXICODONE) 5 MG immediate release tablet, Take 1 tablet (5 mg total) by mouth every 6 (six) hours as needed for severe pain., Disp: 60 tablet, Rfl: 0 No current facility-administered medications for this visit.   Facility-Administered Medications Ordered in Other Visits:  .  0.9 % NaCl with KCl 20 mEq/ L  infusion, , Intravenous, Continuous, Sindy Guadeloupe, MD, Stopped at 11/23/18 1104 .  fluorouracil (ADRUCIL) 4,550 mg in sodium chloride 0.9 % 59 mL chemo infusion, 2,400 mg/m2 (Treatment Plan Recorded), Intravenous, 1 day or 1 dose, Sindy Guadeloupe, MD, 4,550 mg at 11/23/18 1405 .  heparin lock flush 100 unit/mL, 500 Units, Intravenous, Once, Sindy Guadeloupe, MD .  sodium chloride flush (NS) 0.9 % injection 10 mL, 10 mL, Intravenous, Once, Sindy Guadeloupe, MD .  sodium chloride flush (NS) 0.9 % injection 10 mL, 10 mL, Intravenous, Once, Sindy Guadeloupe, MD .  sodium chloride flush (NS) 0.9 % injection 10 mL, 10 mL, Intracatheter, PRN, Sindy Guadeloupe, MD, 10 mL at 11/23/18 1405  Physical exam:  Vitals:   11/23/18 0838  BP: 118/68  Pulse: 67  Resp: 16  Temp: 98.2 F (36.8 C)  TempSrc: Oral  Weight: 147 lb 11.2 oz (67 kg)  Height: 5\' 4"  (1.626 m)   Physical Exam Constitutional:      General: He is not in acute distress. HENT:     Head: Normocephalic and atraumatic.  Eyes:     Pupils: Pupils are equal, round, and reactive to light.  Neck:     Musculoskeletal: Normal range of motion.  Cardiovascular:     Rate and Rhythm: Normal rate and regular rhythm.     Heart sounds: Normal heart sounds.  Pulmonary:     Effort:  Pulmonary effort is normal.     Breath sounds: Normal breath sounds.  Abdominal:     General: Bowel sounds are normal.     Palpations: Abdomen is soft.  Skin:    General: Skin is warm and dry.  Neurological:     Mental Status: He is alert and oriented to person, place, and time.      CMP Latest Ref Rng & Units 11/23/2018  Glucose 70 - 99 mg/dL 135(H)  BUN 8 - 23 mg/dL 9  Creatinine 0.61 - 1.24 mg/dL 1.02  Sodium 135 - 145 mmol/L 140  Potassium 3.5 - 5.1 mmol/L 3.2(L)  Chloride 98 - 111 mmol/L 110  CO2 22 - 32 mmol/L 24  Calcium 8.9 - 10.3 mg/dL 8.7(L)  Total Protein 6.5 - 8.1 g/dL 6.2(L)  Total Bilirubin 0.3 - 1.2 mg/dL 0.3  Alkaline Phos 38 - 126 U/L 108  AST 15 - 41 U/L 22  ALT 0 - 44 U/L 15   CBC Latest Ref Rng & Units 11/23/2018  WBC 4.0 - 10.5 K/uL 6.3  Hemoglobin 13.0 - 17.0 g/dL 10.1(L)  Hematocrit 39.0 - 52.0 % 30.9(L)  Platelets 150 - 400 K/uL 104(L)    Assessment and plan- Patient is a 78 y.o. male with stage III rectal adencarcinoma currently undergoing neoadjuvant chemotherapy. He is here for on treatment assessment prior to cycle 7 of neoadjuvant FOLFOX chemotherapy  Patient has mild thrombocytopenia but as his platelet counts are greater than 100 he will proceed with cycle 7 of neoadjuvant FOLFOX chemotherapy today.  He has been receiving only infusional 5-FU chemotherapy.  No bolus.  Also his oxaliplatin dose has been reduced to 65 mg per metered square.  I will see him back in 2 weeks time with CBC with differential, CMP for cycle 8 of neoadjuvant FOLFOX chemotherapy.  I will plan to get repeat CT chest abdomen and pelvis with contrast at that time and refer him to radiation oncology as well.  I will plan to give him infusional 5-FU chemotherapy along with radiation over Xeloda given his recent esophagitis which will make it harder for him to tolerate Xeloda  Reflux esophagitis: Continue Protonix and Carafate  Chemo-induced anemia: Stable continue to monitor    Visit Diagnosis 1. 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[place])   2. Reflux esophagitis   3. Encounter for antineoplastic chemotherapy   4. Chemotherapy-induced thrombocytopenia      Dr. Randa Evens, MD, MPH [facility] [phone] 11/23/2018 4:17 PM

## 2018-11-23 NOTE — Progress Notes (Signed)
Pt feels like he is eating better. Lost 1 1/2 lbs since last visit

## 2018-11-25 ENCOUNTER — Inpatient Hospital Stay: Payer: Medicare Other

## 2018-11-25 ENCOUNTER — Other Ambulatory Visit: Payer: Self-pay

## 2018-11-25 VITALS — BP 124/67 | HR 84 | Temp 98.1°F | Resp 18

## 2018-11-25 DIAGNOSIS — C20 Malignant neoplasm of rectum: Secondary | ICD-10-CM

## 2018-11-25 MED ORDER — HEPARIN SOD (PORK) LOCK FLUSH 100 UNIT/ML IV SOLN
500.0000 [IU] | Freq: Once | INTRAVENOUS | Status: AC | PRN
  Administered 2018-11-25: 15:00:00 500 [IU]
  Filled 2018-11-25: qty 5

## 2018-11-25 MED ORDER — PEGFILGRASTIM-CBQV 6 MG/0.6ML ~~LOC~~ SOSY
6.0000 mg | PREFILLED_SYRINGE | Freq: Once | SUBCUTANEOUS | Status: AC
  Administered 2018-11-25: 6 mg via SUBCUTANEOUS
  Filled 2018-11-25: qty 0.6

## 2018-11-25 MED ORDER — SODIUM CHLORIDE 0.9% FLUSH
10.0000 mL | INTRAVENOUS | Status: DC | PRN
  Administered 2018-11-25: 10 mL
  Filled 2018-11-25: qty 10

## 2018-12-04 ENCOUNTER — Other Ambulatory Visit: Payer: Self-pay

## 2018-12-04 ENCOUNTER — Encounter: Payer: Self-pay | Admitting: Oncology

## 2018-12-04 NOTE — Progress Notes (Signed)
Patient pre screened for office appointment, no questions or concerns today. 

## 2018-12-07 ENCOUNTER — Inpatient Hospital Stay: Payer: Medicare Other

## 2018-12-07 ENCOUNTER — Other Ambulatory Visit: Payer: Self-pay

## 2018-12-07 ENCOUNTER — Inpatient Hospital Stay (HOSPITAL_BASED_OUTPATIENT_CLINIC_OR_DEPARTMENT_OTHER): Payer: Medicare Other | Admitting: Oncology

## 2018-12-07 VITALS — BP 144/65 | HR 59 | Temp 98.1°F | Ht 64.0 in | Wt 147.0 lb

## 2018-12-07 DIAGNOSIS — E876 Hypokalemia: Secondary | ICD-10-CM

## 2018-12-07 DIAGNOSIS — D6959 Other secondary thrombocytopenia: Secondary | ICD-10-CM | POA: Diagnosis not present

## 2018-12-07 DIAGNOSIS — C20 Malignant neoplasm of rectum: Secondary | ICD-10-CM | POA: Diagnosis not present

## 2018-12-07 DIAGNOSIS — Z23 Encounter for immunization: Secondary | ICD-10-CM

## 2018-12-07 DIAGNOSIS — Z5111 Encounter for antineoplastic chemotherapy: Secondary | ICD-10-CM | POA: Diagnosis not present

## 2018-12-07 DIAGNOSIS — T451X5A Adverse effect of antineoplastic and immunosuppressive drugs, initial encounter: Secondary | ICD-10-CM

## 2018-12-07 LAB — CBC WITH DIFFERENTIAL/PLATELET
Abs Immature Granulocytes: 0.07 10*3/uL (ref 0.00–0.07)
Basophils Absolute: 0.1 10*3/uL (ref 0.0–0.1)
Basophils Relative: 1 %
Eosinophils Absolute: 0.1 10*3/uL (ref 0.0–0.5)
Eosinophils Relative: 1 %
HCT: 30.7 % — ABNORMAL LOW (ref 39.0–52.0)
Hemoglobin: 10 g/dL — ABNORMAL LOW (ref 13.0–17.0)
Immature Granulocytes: 1 %
Lymphocytes Relative: 25 %
Lymphs Abs: 1.4 10*3/uL (ref 0.7–4.0)
MCH: 31.1 pg (ref 26.0–34.0)
MCHC: 32.6 g/dL (ref 30.0–36.0)
MCV: 95.3 fL (ref 80.0–100.0)
Monocytes Absolute: 0.5 10*3/uL (ref 0.1–1.0)
Monocytes Relative: 8 %
Neutro Abs: 3.6 10*3/uL (ref 1.7–7.7)
Neutrophils Relative %: 64 %
Platelets: 91 10*3/uL — ABNORMAL LOW (ref 150–400)
RBC: 3.22 MIL/uL — ABNORMAL LOW (ref 4.22–5.81)
RDW: 17.5 % — ABNORMAL HIGH (ref 11.5–15.5)
WBC: 5.7 10*3/uL (ref 4.0–10.5)
nRBC: 0 % (ref 0.0–0.2)

## 2018-12-07 LAB — COMPREHENSIVE METABOLIC PANEL
ALT: 16 U/L (ref 0–44)
AST: 21 U/L (ref 15–41)
Albumin: 3.2 g/dL — ABNORMAL LOW (ref 3.5–5.0)
Alkaline Phosphatase: 100 U/L (ref 38–126)
Anion gap: 8 (ref 5–15)
BUN: 8 mg/dL (ref 8–23)
CO2: 26 mmol/L (ref 22–32)
Calcium: 8.8 mg/dL — ABNORMAL LOW (ref 8.9–10.3)
Chloride: 106 mmol/L (ref 98–111)
Creatinine, Ser: 0.89 mg/dL (ref 0.61–1.24)
GFR calc Af Amer: >60 mL/min (ref >60)
GFR calc non Af Amer: >60 mL/min (ref >60)
Glucose, Bld: 100 mg/dL — ABNORMAL HIGH (ref 70–99)
Potassium: 2.9 mmol/L — ABNORMAL LOW (ref 3.5–5.1)
Sodium: 140 mmol/L (ref 135–145)
Total Bilirubin: 0.3 mg/dL (ref 0.3–1.2)
Total Protein: 6.3 g/dL — ABNORMAL LOW (ref 6.5–8.1)

## 2018-12-07 MED ORDER — PALONOSETRON HCL INJECTION 0.25 MG/5ML
0.2500 mg | Freq: Once | INTRAVENOUS | Status: AC
  Administered 2018-12-07: 0.25 mg via INTRAVENOUS
  Filled 2018-12-07: qty 5

## 2018-12-07 MED ORDER — LEUCOVORIN CALCIUM INJECTION 350 MG
395.0000 mg/m2 | Freq: Once | INTRAVENOUS | Status: AC
  Administered 2018-12-07: 750 mg via INTRAVENOUS
  Filled 2018-12-07: qty 35

## 2018-12-07 MED ORDER — DEXAMETHASONE SODIUM PHOSPHATE 10 MG/ML IJ SOLN
10.0000 mg | Freq: Once | INTRAMUSCULAR | Status: AC
  Administered 2018-12-07: 10 mg via INTRAVENOUS
  Filled 2018-12-07: qty 1

## 2018-12-07 MED ORDER — DEXTROSE 5 % IV SOLN
Freq: Once | INTRAVENOUS | Status: AC
  Administered 2018-12-07: 09:00:00 via INTRAVENOUS
  Filled 2018-12-07: qty 250

## 2018-12-07 MED ORDER — SODIUM CHLORIDE 0.9 % IV SOLN
Freq: Once | INTRAVENOUS | Status: AC
  Administered 2018-12-07: 10:00:00 via INTRAVENOUS
  Filled 2018-12-07: qty 1000

## 2018-12-07 MED ORDER — SODIUM CHLORIDE 0.9 % IV SOLN
2400.0000 mg/m2 | INTRAVENOUS | Status: DC
  Administered 2018-12-07: 4550 mg via INTRAVENOUS
  Filled 2018-12-07: qty 91

## 2018-12-07 MED ORDER — OXALIPLATIN CHEMO INJECTION 100 MG/20ML
65.0000 mg/m2 | Freq: Once | INTRAVENOUS | Status: AC
  Administered 2018-12-07: 10:00:00 125 mg via INTRAVENOUS
  Filled 2018-12-07: qty 20

## 2018-12-07 MED ORDER — INFLUENZA VAC A&B SA ADJ QUAD 0.5 ML IM PRSY
0.5000 mL | PREFILLED_SYRINGE | Freq: Once | INTRAMUSCULAR | Status: AC
  Administered 2018-12-07: 0.5 mL via INTRAMUSCULAR
  Filled 2018-12-07: qty 0.5

## 2018-12-07 NOTE — Progress Notes (Signed)
Nutrition Follow-up:  Patient with stage III rectal adenocarcinoma.  Patient receiving chemotherapy.  Spoke with patient during infusion.  Reports that appetite is about the same. Trying to drink 3 boost shakes per day (sometimes original shake).     Medications: reviewed  Labs: K 2.9 (supplemented today), glucose 100  Anthropometrics:   Weight stable at 147 lb today same as on 9/14 but overall decreased.    NUTRITION DIAGNOSIS:  Inadequate oral intake stable   INTERVENTION:  Encouraged patient to continue to consume high calorie, high protein foods Continue oral nutrition supplements TID. Provided patient a shake during infusion as did not eat breakfast this am before coming to treatment    MONITORING, EVALUATION, GOAL: Patient will consume adequate calories and protein to prevent further weight loss   NEXT VISIT: October 19th during infusion  Akima Slaugh B. Zenia Resides, [place], [place] Registered Dietitian [phone] (pager)

## 2018-12-07 NOTE — Progress Notes (Signed)
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 He was recently found to have iron deficiency anemia which led to a colonoscopy On 07/15/2018. Colonoscopy showed a villous partially obstructing medium-sized mass in the mid sigmoid colon. The mass was partially circumferential measuring 5 cm. There was another infiltrative partially obstructing medium-sized mass found in the rectosigmoid colon which also measured 5 cm. Pathology from the sigmoid colon mass showed high-grade dysplasia at least involving an adenomatous lesion with villous features. A more serious process is not excluded. Rectosigmoid mass biopsy showed invasive colorectal adenocarcinoma.  Patient was referred to Dr. [name] for surgical management. Patient had a CT abdomen and pelvis with contrast which showed irregular asymmetric mural thickening of the proximal rectum measuring over 6 to 7 cm. Potential local nodal metastases with several subcentimeter iliac lymph nodes and presacral lymph nodes. No evidence of metastatic disease outside the pelvis.  Patient also had MRI of the pelvis with and without contrast which showed tumor length 8.5 cm with extension through muscularis propria decreased C. There is diffuse involvement of muscularis propria. No extramural vascular invasion or tumor thrombus. Shortest distance of  any tumor/note from mesorectal fascia 1 to 2 mm. No involvement of adjacent organs or pelvic sidewall. Mesorectal lymph nodes within perirectal fat including 6 mm node and a low sigmoid mesocolon node measuring 6 mm. Another 6 mm node within the low sigmoid mesocolon representing extremity rectal lymphadenopathy. T3N2 by MRI. Distance from tumor to internal anal sphincter is 6.2 cm.  Plan is to proceed with total neoadjuvant chemotherapy followed by definitive surgery.  Interval history-feels fatigued.  Has occasional epigastric pain which is well controlled.  Denies any nausea vomiting.  Appetite is good and weight has remained stable.  ECOG PS- 1 Pain scale- 0 Opioid associated constipation- no  Review of systems- Review of Systems  Constitutional: Positive for malaise/fatigue. Negative for chills, fever and weight loss.  HENT: Negative for congestion, ear discharge and nosebleeds.   Eyes: Negative for blurred vision.  Respiratory: Negative for cough, hemoptysis, sputum production, shortness of breath and wheezing.   Cardiovascular: Negative for chest pain, palpitations, orthopnea and claudication.  Gastrointestinal: Negative for abdominal pain, blood in stool, constipation, diarrhea, heartburn, melena, nausea and vomiting.  Genitourinary: Negative for dysuria, flank pain, frequency, hematuria and urgency.  Musculoskeletal: Negative for back pain, joint pain and myalgias.  Skin: Negative for rash.  Neurological: Negative for dizziness, tingling, focal weakness, seizures, weakness and headaches.  Endo/Heme/Allergies: Does not bruise/bleed easily.  Psychiatric/Behavioral: Negative for depression and suicidal ideas. The patient does not have insomnia.       No Known Allergies   Past Medical History:  Diagnosis Date   Anemia    Cancer ([place])    Coronary artery disease    Diabetes mellitus without complication ([place])    History of hiatal hernia    Hypercholesterolemia     Hypertension      Past Surgical History:  Procedure  Laterality Date   COLONOSCOPY WITH PROPOFOL N/A 07/15/2018   Procedure: COLONOSCOPY WITH PROPOFOL;  Surgeon: [name], [name], MD;  Location: [facility] ENDOSCOPY;  Service: Gastroenterology;  Laterality: N/A;   ESOPHAGOGASTRODUODENOSCOPY N/A 10/12/2018   Procedure: ESOPHAGOGASTRODUODENOSCOPY (EGD);  Surgeon: [name], MD;  Location: [facility] ENDOSCOPY;  Service: Gastroenterology;  Laterality: N/A;   ESOPHAGOGASTRODUODENOSCOPY (EGD) WITH PROPOFOL N/A 07/15/2018   Procedure: ESOPHAGOGASTRODUODENOSCOPY (EGD) WITH PROPOFOL;  Surgeon: [name], [name], MD;  Location: [facility] ENDOSCOPY;  Service: Gastroenterology;  Laterality: N/A;   PORTACATH PLACEMENT N/A 08/13/2018   Procedure: INSERTION PORT-A-CATH;  Surgeon: [name], MD;  Location: [facility] ORS;  Service: General;  Laterality: N/A;   PROSTATE SURGERY      Social History   Socioeconomic History   Marital status: Widowed    Spouse name: Not on file   Number of children: 0   Years of education: Not on file   Highest education level: Not on file  Occupational History   Occupation: Pharmacist, hospital    Comment: Retired  Scientist, product/process development strain: Not hard at [employer] insecurity    Worry: Never true    Inability: Never true   Transportation needs    Medical: No    Non-medical: No  Tobacco Use   Smoking status: Never Smoker   Smokeless tobacco: Never Used  Substance and Sexual Activity   Alcohol use: Not Currently   Drug use: Never   Sexual activity: Not Currently  Lifestyle   Physical activity    Days per week: 2 days    Minutes per session: 30 min   Stress: Only a little  Relationships   Social connections    Talks on phone: More than three times a week    Gets together: More than three times a week    Attends religious service: Not on file    Active member of club or organization: Not on file    Attends meetings of clubs or  organizations: Not on file    Relationship status: Widowed   Intimate partner violence    Fear of current or ex partner: No    Emotionally abused: No    Physically abused: No    Forced sexual activity: No  Other Topics Concern   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  His wife was a Camera operator and they traveled frequently.  Several nieces and nephews.  He is the youngest of his brothers and sisters and only surviving.    History reviewed. No pertinent family history.   Current Outpatient Medications:    baclofen (LIORESAL) 10 MG tablet, Take 1 tablet by mouth 1 day or 1 dose., Disp: , Rfl:    ezetimibe (ZETIA) 10 MG tablet, Take 10 mg by mouth every morning. , Disp: , Rfl:    HUMALOG MIX 75/25 KWIKPEN (75-25) 100 UNIT/ML Kwikpen, Inject 4 Units into the skin every morning., Disp: 15 mL, Rfl: 11   lidocaine-prilocaine (EMLA) cream, Apply to affected area once, Disp: 30 g, Rfl: 3   metoprolol succinate (TOPROL-XL) 50 MG 24 hr tablet, Take 1 tablet by mouth 1 day or 1 dose., Disp: , Rfl:    nystatin (MYCOSTATIN) 100000 UNIT/ML suspension, Take 5 mLs (500,000 Units total) by mouth 4 (four) times daily., Disp: 120 mL, Rfl: 1   OLANZapine (ZYPREXA) 10 MG tablet, Take  1 tablet (10 mg total) by mouth at bedtime., Disp: 30 tablet, Rfl: 0   ondansetron (ZOFRAN) 8 MG tablet, TAKE 1 TABLET BY MOUTH TWICE DAILY AS NEEDED REFRACTORY NAUSEA/VOMITING START ON DAY 3 AFTER CHEMOTHERAPY, Disp: 30 tablet, Rfl: 1   oxyCODONE (OXY IR/ROXICODONE) 5 MG immediate release tablet, Take 1 tablet (5 mg total) by mouth every 6 (six) hours as needed for severe pain., Disp: 60 tablet, Rfl: 0   pantoprazole (PROTONIX) 40 MG tablet, Take 1 tablet (40 mg total) by mouth 2 (two) times daily before a meal., Disp: 60 tablet, Rfl: 0   potassium chloride (KLOR-CON) 20 MEQ  packet, Take 20 mEq by mouth daily., Disp: 14 packet, Rfl: 0   prochlorperazine (COMPAZINE) 10 MG tablet, TAKE 1 TABLET BY MOUTH EVERY 6 HOURS AS NEEDED NAUSEA AND VOMITING, Disp: 30 tablet, Rfl: 1   sucralfate (CARAFATE) 1 GM/10ML suspension, Take 10 mLs (1 g total) by mouth 4 (four) times daily -  with meals and at bedtime., Disp: 420 mL, Rfl: 0   traZODone (DESYREL) 50 MG tablet, Take 50 mg by mouth at bedtime as needed for sleep. , Disp: , Rfl:  No current facility-administered medications for this visit.   Facility-Administered Medications Ordered in Other Visits:    sodium chloride flush (NS) 0.9 % injection 10 mL, 10 mL, Intravenous, Once, Sindy Guadeloupe, MD   sodium chloride flush (NS) 0.9 % injection 10 mL, 10 mL, Intravenous, Once, Sindy Guadeloupe, MD  Physical exam:  Vitals:   12/07/18 0822  BP: (!) 144/65  Pulse: (!) 59  Temp: 98.1 F (36.7 C)  Weight: 147 lb (66.7 kg)  Height: 5\' 4"  (1.626 m)   Physical Exam Constitutional:      General: He is not in acute distress. HENT:     Head: Normocephalic and atraumatic.  Eyes:     Pupils: Pupils are equal, round, and reactive to light.  Neck:     Musculoskeletal: Normal range of motion.  Cardiovascular:     Rate and Rhythm: Normal rate and regular rhythm.     Heart sounds: Normal heart sounds.  Pulmonary:     Effort: Pulmonary effort is normal.     Breath sounds: Normal breath sounds.  Abdominal:     General: Bowel sounds are normal.     Palpations: Abdomen is soft.  Skin:    General: Skin is warm and dry.  Neurological:     Mental Status: He is alert and oriented to person, place, and time.      CMP Latest Ref Rng & Units 12/07/2018  Glucose 70 - 99 mg/dL 100(H)  BUN 8 - 23 mg/dL 8  Creatinine 0.61 - 1.24 mg/dL 0.89  Sodium 135 - 145 mmol/L 140  Potassium 3.5 - 5.1 mmol/L 2.9(L)  Chloride 98 - 111 mmol/L 106  CO2 22 - 32 mmol/L 26  Calcium 8.9 - 10.3 mg/dL 8.8(L)  Total Protein 6.5 - 8.1 g/dL 6.3(L)    Total Bilirubin 0.3 - 1.2 mg/dL 0.3  Alkaline Phos 38 - 126 U/L 100  AST 15 - 41 U/L 21  ALT 0 - 44 U/L 16   CBC Latest Ref Rng & Units 12/07/2018  WBC 4.0 - 10.5 K/uL 5.7  Hemoglobin 13.0 - 17.0 g/dL 10.0(L)  Hematocrit 39.0 - 52.0 % 30.7(L)  Platelets 150 - 400 K/uL 91(L)     Assessment and plan- Patient is a 78 y.o. male with stage III rectal adencarcinoma currently undergoing neoadjuvant chemotherapy.  He is here for on treatment assessment prior to cycle 8 of neoadjuvant FOLFOX chemotherapy  Hemoglobin is stable around 10.  Platelet counts are mildly low at 91.  Since counts are still more than 75 okay to proceed with infusional 5-FU chemotherapy along with oxaliplatin at 65 mg per metered square.  He has not been getting bolus 5-FU.  He will come back on day 3 for pump disconnect and receive Udenyca on that day.  Repeat CT chest abdomen and pelvis with contrast in 1 week.  I will also send him to radiation oncology to proceed with neoadjuvant concurrent chemoradiation at that time.  Hopefully radiation to start soon in the next 2 to 3 weeks.  Patient will have problems tolerating oral Xeloda given his ongoing esophagitis and difficulty to swallow large pills.  It may also cause more nausea vomiting.  I will therefore proceed with infusional 5-FU chemotherapy day 1 to day 4 every [redacted] weeks along with radiation treatment.  Patient verbalized understanding  Hypokalemia.  Patient has been asked not to take oral potassium because of her severe esophagitis couple of months ago.  We will continue to replace his potassium with IV and he will get 40 mEq of IV potassium today over 2 hours.   Visit Diagnosis 1. 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[place])   2. Encounter for antineoplastic chemotherapy   3. Chemotherapy-induced thrombocytopenia   4. Hypokalemia      Dr. Randa Evens, MD, MPH [facility] [id number] 12/07/2018 8:57 AM

## 2018-12-09 ENCOUNTER — Other Ambulatory Visit: Payer: Self-pay

## 2018-12-09 ENCOUNTER — Inpatient Hospital Stay: Payer: Medicare Other

## 2018-12-09 VITALS — BP 131/74 | HR 70 | Temp 97.5°F | Resp 20

## 2018-12-09 DIAGNOSIS — C20 Malignant neoplasm of rectum: Secondary | ICD-10-CM | POA: Diagnosis not present

## 2018-12-09 MED ORDER — HEPARIN SOD (PORK) LOCK FLUSH 100 UNIT/ML IV SOLN
500.0000 [IU] | Freq: Once | INTRAVENOUS | Status: AC | PRN
  Administered 2018-12-09: 500 [IU]
  Filled 2018-12-09: qty 5

## 2018-12-09 MED ORDER — PEGFILGRASTIM-CBQV 6 MG/0.6ML ~~LOC~~ SOSY
6.0000 mg | PREFILLED_SYRINGE | Freq: Once | SUBCUTANEOUS | Status: AC
  Administered 2018-12-09: 6 mg via SUBCUTANEOUS
  Filled 2018-12-09: qty 0.6

## 2018-12-09 MED ORDER — SODIUM CHLORIDE 0.9% FLUSH
10.0000 mL | INTRAVENOUS | Status: DC | PRN
  Administered 2018-12-09: 10 mL
  Filled 2018-12-09: qty 10

## 2018-12-13 ENCOUNTER — Other Ambulatory Visit: Payer: Self-pay | Admitting: Oncology

## 2018-12-14 ENCOUNTER — Ambulatory Visit
Admission: RE | Admit: 2018-12-14 | Discharge: 2018-12-14 | Disposition: A | Discharge status: Home / Self Care | Payer: Medicare Other | Source: Ambulatory Visit | Attending: Oncology | Admitting: Oncology

## 2018-12-14 ENCOUNTER — Other Ambulatory Visit: Payer: Self-pay

## 2018-12-14 DIAGNOSIS — C20 Malignant neoplasm of rectum: Secondary | ICD-10-CM | POA: Insufficient documentation

## 2018-12-14 MED ORDER — IOHEXOL 300 MG/ML  SOLN
85.0000 mL | Freq: Once | INTRAMUSCULAR | Status: AC | PRN
  Administered 2018-12-14: 12:00:00 85 mL via INTRAVENOUS

## 2018-12-16 ENCOUNTER — Other Ambulatory Visit: Payer: Self-pay

## 2018-12-17 ENCOUNTER — Ambulatory Visit
Admission: RE | Admit: 2018-12-17 | Discharge: 2018-12-17 | Disposition: A | Discharge status: Home / Self Care | Payer: Medicare Other | Source: Ambulatory Visit | Attending: Radiation Oncology | Admitting: Radiation Oncology

## 2018-12-17 ENCOUNTER — Other Ambulatory Visit: Payer: Self-pay

## 2018-12-17 ENCOUNTER — Encounter: Payer: Self-pay | Admitting: Radiation Oncology

## 2018-12-17 VITALS — BP 142/71 | HR 69 | Temp 94.6°F | Wt 142.0 lb

## 2018-12-17 DIAGNOSIS — I10 Essential (primary) hypertension: Secondary | ICD-10-CM | POA: Diagnosis not present

## 2018-12-17 DIAGNOSIS — D509 Iron deficiency anemia, unspecified: Secondary | ICD-10-CM | POA: Diagnosis not present

## 2018-12-17 DIAGNOSIS — Z79899 Other long term (current) drug therapy: Secondary | ICD-10-CM | POA: Diagnosis not present

## 2018-12-17 DIAGNOSIS — I2510 Atherosclerotic heart disease of native coronary artery without angina pectoris: Secondary | ICD-10-CM | POA: Insufficient documentation

## 2018-12-17 DIAGNOSIS — K449 Diaphragmatic hernia without obstruction or gangrene: Secondary | ICD-10-CM | POA: Insufficient documentation

## 2018-12-17 DIAGNOSIS — C20 Malignant neoplasm of rectum: Secondary | ICD-10-CM | POA: Diagnosis present

## 2018-12-17 DIAGNOSIS — Z8546 Personal history of malignant neoplasm of prostate: Secondary | ICD-10-CM | POA: Insufficient documentation

## 2018-12-17 DIAGNOSIS — E7800 Pure hypercholesterolemia, unspecified: Secondary | ICD-10-CM | POA: Insufficient documentation

## 2018-12-17 DIAGNOSIS — E119 Type 2 diabetes mellitus without complications: Secondary | ICD-10-CM | POA: Insufficient documentation

## 2018-12-17 NOTE — Consult Note (Signed)
NEW PATIENT EVALUATION  Name: Willie [name]  MRN: [id number]  Date:   12/17/2018     DOB: September 06, 1940   This 78 y.o. male patient presents to the clinic for initial evaluation of stage IIIb (clinical T3 N2 M0) adenocarcinoma of the rectum.  REFERRING PHYSICIAN: [name], MD  CHIEF COMPLAINT:  Chief Complaint  Patient presents with  . Rectal Cancer    Initial Eval    DIAGNOSIS: The encounter diagnosis was 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[place]).   PREVIOUS INVESTIGATIONS:  MRI and CT scans reviewed Pathology report reviewed Clinical notes reviewed  HPI: Patient is an 78 year old male recently found in May 2020 to have iron deficiency anemia.  Underwent colonoscopy showing partially obstructing medium-sized mass in the mid sigmoid colon.  Mass measured proximally 5 cm and was circumferential.  Pathology of the sigmoid colon mass showed high-grade dysplasia involving an adenomatous lesion with villous features.  Rectosigmoid mass was positive for invasive adenocarcinoma of the rectum.  CT scan demonstrated thickening wall in the rectosigmoid colon compatible with patient's known colorectal cancer.  There were also small perirectal lymph nodes noted.  MRI of the pelvis was also performed showing a stage T3N2 adenocarcinoma of the rectum.  6.2 cm from the internal anal sphincter.  Tumor extended through the muscularis propria.  Patient is been seen by surgery as well as medical oncology.  Plan is to proceed with preoperative concurrent chemoradiation.  Patient is seen today is doing fairly well he states his bowel movements tend to be liquid.  He is having no rectal pain at this time and no discrete bright red blood per rectum.  PLANNED TREATMENT REGIMEN: Neoadjuvant chemoradiation  PAST MEDICAL HISTORY:  has a past medical history of Anemia, Coronary artery disease, Diabetes mellitus without complication ([place]), History of hiatal hernia, Hypercholesterolemia, Hypertension, Prostate cancer ([place]) (2000),  and 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[place]) (09/2018).    PAST SURGICAL HISTORY:  Past Surgical History:  Procedure Laterality Date  . COLONOSCOPY WITH PROPOFOL N/A 07/15/2018   Procedure: COLONOSCOPY WITH PROPOFOL;  Surgeon: [name], [name], MD;  Location: [facility] ENDOSCOPY;  Service: Gastroenterology;  Laterality: N/A;  . ESOPHAGOGASTRODUODENOSCOPY N/A 10/12/2018   Procedure: ESOPHAGOGASTRODUODENOSCOPY (EGD);  Surgeon: [name], MD;  [facility] ENDOSCOPY;  Service: Gastroenterology;  Laterality: N/A;  . ESOPHAGOGASTRODUODENOSCOPY (EGD) WITH PROPOFOL N/A 07/15/2018   Procedure: ESOPHAGOGASTRODUODENOSCOPY (EGD) WITH PROPOFOL;  Surgeon: [name], [name], MD;  Location: [facility] ENDOSCOPY;  Service: Gastroenterology;  Laterality: N/A;  . PORTACATH PLACEMENT N/A 08/13/2018   Procedure: INSERTION PORT-A-CATH;  Surgeon: [name], MD;  Location: [facility] ORS;  Service: General;  Laterality: N/A;  . PROSTATE SURGERY      FAMILY HISTORY: family history is not on file.  SOCIAL HISTORY:  reports that he has never smoked. He has never used smokeless tobacco. He reports previous alcohol use. He reports that he does not use drugs.  ALLERGIES: Patient has no known allergies.  MEDICATIONS:  Current Outpatient Medications  Medication Sig Dispense Refill  . baclofen (LIORESAL) 10 MG tablet Take 1 tablet by mouth 1 day or 1 dose.    . ezetimibe (ZETIA) 10 MG tablet Take 10 mg by mouth every morning.     Marland Kitchen HUMALOG MIX 75/25 KWIKPEN (75-25) 100 UNIT/ML Kwikpen Inject 4 Units into the skin every morning. 15 mL 11  . lidocaine-prilocaine (EMLA) cream Apply to affected area once 30 g 3  . metoprolol succinate (TOPROL-XL) 50 MG 24 hr tablet Take 1 tablet by mouth 1 day or  1 dose.    . nystatin (MYCOSTATIN) 100000 UNIT/ML suspension TAKE 1 TEASPOONSFUL BY MOUTH 4 TIMES DAILY 120 mL 1  . OLANZapine (ZYPREXA) 10 MG tablet Take 1 tablet (10 mg total) by mouth at bedtime. 30 tablet 0  . ondansetron (ZOFRAN) 8 MG tablet  TAKE 1 TABLET BY MOUTH TWICE DAILY AS NEEDED REFRACTORY NAUSEA/VOMITING START ON DAY 3 AFTER CHEMOTHERAPY 30 tablet 1  . oxyCODONE (OXY IR/ROXICODONE) 5 MG immediate release tablet Take 1 tablet (5 mg total) by mouth every 6 (six) hours as needed for severe pain. 60 tablet 0  . pantoprazole (PROTONIX) 40 MG tablet Take 1 tablet (40 mg total) by mouth 2 (two) times daily before a meal. 60 tablet 0  . potassium chloride (KLOR-CON) 20 MEQ packet Take 20 mEq by mouth daily. 14 packet 0  . prochlorperazine (COMPAZINE) 10 MG tablet TAKE 1 TABLET BY MOUTH EVERY 6 HOURS AS NEEDED NAUSEA AND VOMITING 30 tablet 1  . sucralfate (CARAFATE) 1 GM/10ML suspension Take 10 mLs (1 g total) by mouth 4 (four) times daily -  with meals and at bedtime. 420 mL 0  . traZODone (DESYREL) 50 MG tablet Take 50 mg by mouth at bedtime as needed for sleep.      No current facility-administered medications for this encounter.    Facility-Administered Medications Ordered in Other Encounters  Medication Dose Route Frequency Provider Last Rate Last Dose  . sodium chloride flush (NS) 0.9 % injection 10 mL  10 mL Intravenous Once Sindy Guadeloupe, MD      . sodium chloride flush (NS) 0.9 % injection 10 mL  10 mL Intravenous Once Sindy Guadeloupe, MD        ECOG PERFORMANCE STATUS:  1 - Symptomatic but completely ambulatory  REVIEW OF SYSTEMS: Patient denies any weight loss, fatigue, weakness, fever, chills or night sweats. Patient denies any loss of vision, blurred vision. Patient denies any ringing  of the ears or hearing loss. No irregular heartbeat. Patient denies heart murmur or history of fainting. Patient denies any chest pain or pain radiating to her upper extremities. Patient denies any shortness of breath, difficulty breathing at night, cough or hemoptysis. Patient denies any swelling in the lower legs. Patient denies any nausea vomiting, vomiting of blood, or coffee ground material in the vomitus. Patient denies any stomach  pain. Patient states has had normal bowel movements no significant constipation or diarrhea. Patient denies any dysuria, hematuria or significant nocturia. Patient denies any problems walking, swelling in the joints or loss of balance. Patient denies any skin changes, loss of hair or loss of weight. Patient denies any excessive worrying or anxiety or significant depression. Patient denies any problems with insomnia. Patient denies excessive thirst, polyuria, polydipsia. Patient denies any swollen glands, patient denies easy bruising or easy bleeding. Patient denies any recent infections, allergies or URI. Patient "s visual fields have not changed significantly in recent time.   PHYSICAL EXAM: BP (!) 142/71   Pulse 69   Temp (!) 94.6 F (34.8 C)   Wt 142 lb (64.4 kg)   BMI 24.37 kg/m  Well-developed well-nourished patient in NAD. HEENT reveals PERLA, EOMI, discs not visualized.  Oral cavity is clear. No oral mucosal lesions are identified. Neck is clear without evidence of cervical or supraclavicular adenopathy. Lungs are clear to A&P. Cardiac examination is essentially unremarkable with regular rate and rhythm without murmur rub or thrill. Abdomen is benign with no organomegaly or masses noted. Motor sensory and DTR  levels are equal and symmetric in the upper and lower extremities. Cranial nerves II through XII are grossly intact. Proprioception is intact. No peripheral adenopathy or edema is identified. No motor or sensory levels are noted. Crude visual fields are within normal range.  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  PLAN: At this time I have recommended radiation therapy along with chemotherapy in a neoadjuvant fashion.  We will plan on delivering 4500 cGy to his  whole pelvis boosting his lesion another 540 cGy.  Risks and benefits of treatment including increased diarrhea fatigue increased lower urinary tract symptoms skin reaction alteration of blood counts and skin reaction all were described in detail to the patient.  There will be extra effort by both professional staff as well as technical staff to coordinate and manage concurrent chemoradiation and ensuing side effects during his treatments. Patient comprehends my treatment plan well.  I have personally set up and ordered CT simulation for later this week.  I would like to take this opportunity to thank you for allowing me to participate in the care of your patient.Noreene Filbert, MD

## 2018-12-18 ENCOUNTER — Telehealth: Payer: Self-pay | Admitting: *Deleted

## 2018-12-18 ENCOUNTER — Other Ambulatory Visit: Payer: Self-pay

## 2018-12-18 MED ORDER — APIXABAN 5 MG PO TABS: 5.0000 mg | ORAL_TABLET | ORAL | 0 refills | Status: DC

## 2018-12-18 NOTE — Telephone Encounter (Signed)
I  Spoke to pt. And explained that on the ct scan that he had recently showed a small clot in the vessel Internal jugular. That is the same vessel the port was placed. Due to it being discovered Dr. Janese Banks felt that it is best that he go on a blood thinner and at first he takes the eliquis 2 tablets twice a day for 7 days and then 1 tablet twice a day every day after that.Dr. Janese Banks feels that he will need it for 3 months. Sometimes it can be expensive so if he needs help to please let me know. I gave him direct number. I have sent rx to his pharmacy. Also I told him if he bumps into things then he will bruise easily and if he falls and hits his head he should be checked out by ER. Also if he falls and hurts anything and has big bruising or swelling he should go to ER. He states that he has been on blood thinners before and knows precautions

## 2018-12-21 ENCOUNTER — Other Ambulatory Visit: Payer: Self-pay

## 2018-12-21 ENCOUNTER — Other Ambulatory Visit: Payer: Self-pay | Admitting: *Deleted

## 2018-12-21 ENCOUNTER — Ambulatory Visit
Admission: RE | Admit: 2018-12-21 | Discharge: 2018-12-21 | Disposition: A | Discharge status: Home / Self Care | Payer: Medicare Other | Source: Ambulatory Visit | Attending: Radiation Oncology | Admitting: Radiation Oncology

## 2018-12-21 ENCOUNTER — Telehealth: Payer: Self-pay | Admitting: *Deleted

## 2018-12-21 DIAGNOSIS — C20 Malignant neoplasm of rectum: Secondary | ICD-10-CM | POA: Diagnosis present

## 2018-12-21 DIAGNOSIS — Z510 Encounter for antineoplastic radiation therapy: Secondary | ICD-10-CM | POA: Insufficient documentation

## 2018-12-21 MED ORDER — RIVAROXABAN (XARELTO) VTE STARTER PACK (15 & 20 MG): ORAL_TABLET | ORAL | 0 refills | Status: DC

## 2018-12-21 NOTE — Telephone Encounter (Signed)
Called pt and the pharmacy. He was only given partial fill of 14 pills eliquis and was told by pharmacist that it just needed a PA and would not pay for more than 1 pill bid.  I said I would get him a sample drug. When I came to work today we have no samples of eliquis just xarelto.  When I went to do PA it was stating that insurance not cover eliquis. I had chat with someone on the website at cover my meds and they said it is not covered and he would have to have tried other meds on list before they would cover eliquis. So dr Janese Banks said to switch from eliquis to xarelto. Called pharmacy and told them, called pt and nephew and told them. The xarelto will be ready tom. Am . Pt will take eliquis in am tom. And start xarelto in pm of tom.

## 2018-12-23 DIAGNOSIS — Z510 Encounter for antineoplastic radiation therapy: Secondary | ICD-10-CM | POA: Diagnosis not present

## 2018-12-25 ENCOUNTER — Other Ambulatory Visit: Payer: Self-pay

## 2018-12-25 ENCOUNTER — Encounter: Payer: Self-pay | Admitting: Oncology

## 2018-12-25 NOTE — Progress Notes (Signed)
Pre-visit assessment completed prior to [place] appointment on 12/28/2018. No concerns at this time.

## 2018-12-28 ENCOUNTER — Inpatient Hospital Stay: Payer: Medicare Other

## 2018-12-28 ENCOUNTER — Other Ambulatory Visit: Payer: Self-pay

## 2018-12-28 ENCOUNTER — Ambulatory Visit
Admission: RE | Admit: 2018-12-28 | Discharge: 2018-12-28 | Disposition: A | Discharge status: Home / Self Care | Payer: Medicare Other | Source: Ambulatory Visit | Attending: Radiation Oncology | Admitting: Radiation Oncology

## 2018-12-28 ENCOUNTER — Inpatient Hospital Stay: Payer: Medicare Other | Attending: Oncology

## 2018-12-28 ENCOUNTER — Inpatient Hospital Stay (HOSPITAL_BASED_OUTPATIENT_CLINIC_OR_DEPARTMENT_OTHER): Payer: Medicare Other | Admitting: Oncology

## 2018-12-28 VITALS — BP 143/73 | HR 67 | Temp 95.9°F | Ht 64.0 in | Wt 148.0 lb

## 2018-12-28 DIAGNOSIS — E119 Type 2 diabetes mellitus without complications: Secondary | ICD-10-CM | POA: Insufficient documentation

## 2018-12-28 DIAGNOSIS — E876 Hypokalemia: Secondary | ICD-10-CM | POA: Diagnosis not present

## 2018-12-28 DIAGNOSIS — Z79899 Other long term (current) drug therapy: Secondary | ICD-10-CM | POA: Insufficient documentation

## 2018-12-28 DIAGNOSIS — E785 Hyperlipidemia, unspecified: Secondary | ICD-10-CM | POA: Insufficient documentation

## 2018-12-28 DIAGNOSIS — Z9079 Acquired absence of other genital organ(s): Secondary | ICD-10-CM | POA: Insufficient documentation

## 2018-12-28 DIAGNOSIS — Z510 Encounter for antineoplastic radiation therapy: Secondary | ICD-10-CM | POA: Diagnosis not present

## 2018-12-28 DIAGNOSIS — C20 Malignant neoplasm of rectum: Secondary | ICD-10-CM | POA: Insufficient documentation

## 2018-12-28 DIAGNOSIS — Z8546 Personal history of malignant neoplasm of prostate: Secondary | ICD-10-CM | POA: Insufficient documentation

## 2018-12-28 DIAGNOSIS — I10 Essential (primary) hypertension: Secondary | ICD-10-CM | POA: Insufficient documentation

## 2018-12-28 DIAGNOSIS — Z7901 Long term (current) use of anticoagulants: Secondary | ICD-10-CM | POA: Diagnosis not present

## 2018-12-28 DIAGNOSIS — Z5111 Encounter for antineoplastic chemotherapy: Secondary | ICD-10-CM

## 2018-12-28 DIAGNOSIS — E7800 Pure hypercholesterolemia, unspecified: Secondary | ICD-10-CM | POA: Insufficient documentation

## 2018-12-28 DIAGNOSIS — Z95828 Presence of other vascular implants and grafts: Secondary | ICD-10-CM

## 2018-12-28 LAB — CBC WITH DIFFERENTIAL/PLATELET
Abs Immature Granulocytes: 0.09 10*3/uL — ABNORMAL HIGH (ref 0.00–0.07)
Basophils Absolute: 0.1 10*3/uL (ref 0.0–0.1)
Basophils Relative: 1 %
Eosinophils Absolute: 0.1 10*3/uL (ref 0.0–0.5)
Eosinophils Relative: 1 %
HCT: 30.1 % — ABNORMAL LOW (ref 39.0–52.0)
Hemoglobin: 9.7 g/dL — ABNORMAL LOW (ref 13.0–17.0)
Immature Granulocytes: 2 %
Lymphocytes Relative: 30 %
Lymphs Abs: 1.5 10*3/uL (ref 0.7–4.0)
MCH: 31.9 pg (ref 26.0–34.0)
MCHC: 32.2 g/dL (ref 30.0–36.0)
MCV: 99 fL (ref 80.0–100.0)
Monocytes Absolute: 0.7 10*3/uL (ref 0.1–1.0)
Monocytes Relative: 13 %
Neutro Abs: 2.7 10*3/uL (ref 1.7–7.7)
Neutrophils Relative %: 53 %
Platelets: 196 10*3/uL (ref 150–400)
RBC: 3.04 MIL/uL — ABNORMAL LOW (ref 4.22–5.81)
RDW: 16.4 % — ABNORMAL HIGH (ref 11.5–15.5)
WBC: 5.1 10*3/uL (ref 4.0–10.5)
nRBC: 0 % (ref 0.0–0.2)

## 2018-12-28 LAB — COMPREHENSIVE METABOLIC PANEL
ALT: 30 U/L (ref 0–44)
AST: 32 U/L (ref 15–41)
Albumin: 3.2 g/dL — ABNORMAL LOW (ref 3.5–5.0)
Alkaline Phosphatase: 82 U/L (ref 38–126)
Anion gap: 6 (ref 5–15)
BUN: 11 mg/dL (ref 8–23)
CO2: 24 mmol/L (ref 22–32)
Calcium: 8.6 mg/dL — ABNORMAL LOW (ref 8.9–10.3)
Chloride: 109 mmol/L (ref 98–111)
Creatinine, Ser: 0.86 mg/dL (ref 0.61–1.24)
GFR calc Af Amer: >60 mL/min (ref >60)
GFR calc non Af Amer: >60 mL/min (ref >60)
Glucose, Bld: 146 mg/dL — ABNORMAL HIGH (ref 70–99)
Potassium: 3.7 mmol/L (ref 3.5–5.1)
Sodium: 139 mmol/L (ref 135–145)
Total Bilirubin: 0.2 mg/dL — ABNORMAL LOW (ref 0.3–1.2)
Total Protein: 6.3 g/dL — ABNORMAL LOW (ref 6.5–8.1)

## 2018-12-28 MED ORDER — SODIUM CHLORIDE 0.9% FLUSH
10.0000 mL | Freq: Once | INTRAVENOUS | Status: AC
  Administered 2018-12-28: 10 mL via INTRAVENOUS
  Filled 2018-12-28: qty 10

## 2018-12-28 MED ORDER — SODIUM CHLORIDE 0.9 % IV SOLN
1000.0000 mg/m2/d | INTRAVENOUS | Status: DC
  Administered 2018-12-28: 10:00:00 6950 mg via INTRAVENOUS
  Filled 2018-12-28: qty 100

## 2018-12-28 MED ORDER — SODIUM CHLORIDE 0.9% FLUSH
10.0000 mL | INTRAVENOUS | Status: DC | PRN
  Filled 2018-12-28: qty 10

## 2018-12-28 NOTE — Progress Notes (Signed)
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 He was recently found to have iron deficiency anemia which led to a colonoscopy On 07/15/2018. Colonoscopy showed a villous partially obstructing medium-sized mass in the mid sigmoid colon. The mass was partially circumferential measuring 5 cm. There was another infiltrative partially obstructing medium-sized mass found in the rectosigmoid colon which also measured 5 cm. Pathology from the sigmoid colon mass showed high-grade dysplasia at least involving an adenomatous lesion with villous features. A more serious process is not excluded. Rectosigmoid mass biopsy showed invasive colorectal adenocarcinoma.  Patient was referred to Dr. [name] for surgical management. Patient had a CT abdomen and pelvis with contrast which showed irregular asymmetric mural thickening of the proximal rectum measuring over 6 to 7 cm. Potential local nodal metastases with several subcentimeter iliac lymph nodes and presacral lymph nodes. No evidence of metastatic disease outside the pelvis.  Patient also had MRI of the pelvis with and without contrast which showed tumor length 8.5 cm with extension through muscularis propria decreased C. There is diffuse involvement of muscularis propria. No extramural vascular invasion or tumor thrombus.  Shortest distance of any tumor/note from mesorectal fascia 1 to 2 mm. No involvement of adjacent organs or pelvic sidewall. Mesorectal lymph nodes within perirectal fat including 6 mm node and a low sigmoid mesocolon node measuring 6 mm. Another 6 mm node within the low sigmoid mesocolon representing extremity rectal lymphadenopathy. T3N2 by MRI. Distance from tumor to internal anal sphincter is 6.2 cm.  Plan is to proceed with total neoadjuvant chemotherapy followed by definitive surgery.  Patient completed 8 cycles of neoadjuvant FOLFOX chemotherapy on 12/07/2018.  Interim scans showed similar-appearing irregular wall thickening of the rectum but no retroperitoneal pelvic adenopathy.  Interval history-he will start his first radiation today.  Overall he feels well today.  Denies any pain or nausea.  Appetite and weight have remained stable.  ECOG PS- 1 Pain scale- 0 Opioid associated constipation- no  Review of systems- Review of Systems  Constitutional: Negative for chills, fever, malaise/fatigue and weight loss.  HENT: Negative for congestion, ear discharge and nosebleeds.   Eyes: Negative for blurred vision.  Respiratory: Negative for cough, hemoptysis, sputum production, shortness of breath and wheezing.   Cardiovascular: Negative for chest pain, palpitations, orthopnea and claudication.  Gastrointestinal: Negative for abdominal pain, blood in stool, constipation, diarrhea, heartburn, melena, nausea and vomiting.  Genitourinary: Negative for dysuria, flank pain, frequency, hematuria and urgency.  Musculoskeletal: Negative for back pain, joint pain and myalgias.  Skin: Negative for rash.  Neurological: Negative for dizziness, tingling, focal weakness, seizures, weakness and headaches.  Endo/Heme/Allergies: Does not bruise/bleed easily.  Psychiatric/Behavioral: Negative for depression and suicidal ideas. The patient does not have insomnia.        No Known Allergies   Past  Medical History:  Diagnosis Date   Anemia    Coronary artery disease    Diabetes mellitus without complication ([place])  History of hiatal hernia    Hypercholesterolemia    Hypertension    Prostate cancer ([place]) 2000   Prostatectomy.    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[place]) 09/2018   Chemo tx's.      Past Surgical History:  Procedure Laterality Date   COLONOSCOPY WITH PROPOFOL N/A 07/15/2018   Procedure: COLONOSCOPY WITH PROPOFOL;  Surgeon: [name], [name], MD;  Location: [facility] ENDOSCOPY;  Service: Gastroenterology;  Laterality: N/A;   ESOPHAGOGASTRODUODENOSCOPY N/A 10/12/2018   Procedure: ESOPHAGOGASTRODUODENOSCOPY (EGD);  Surgeon: [name], MD;  Location: [facility] ENDOSCOPY;  Service: Gastroenterology;  Laterality: N/A;   ESOPHAGOGASTRODUODENOSCOPY (EGD) WITH PROPOFOL N/A 07/15/2018   Procedure: ESOPHAGOGASTRODUODENOSCOPY (EGD) WITH PROPOFOL;  Surgeon: [name], [name], MD;  Location: [facility] ENDOSCOPY;  Service: Gastroenterology;  Laterality: N/A;   PORTACATH PLACEMENT N/A 08/13/2018   Procedure: INSERTION PORT-A-CATH;  Surgeon: [name], MD;  Location: [facility] ORS;  Service: General;  Laterality: N/A;   PROSTATE SURGERY      Social History   Socioeconomic History   Marital status: Widowed    Spouse name: Not on file   Number of children: 0   Years of education: Not on file   Highest education level: Not on file  Occupational History   Occupation: Pharmacist, hospital    Comment: Retired  Scientist, product/process development strain: Not hard at [employer]    Worry: Never true    Inability: Never true   Transportation needs    Medical: No    Non-medical: No  Tobacco Use   Smoking status: Never Smoker   Smokeless tobacco: Never Used  Substance and Sexual Activity   Alcohol use: Not Currently   Drug use: Never   Sexual activity: Not Currently  Lifestyle   Physical activity    Days per week: 2 days    Minutes per session: 30 min   Stress: Only a  little  Relationships   Social connections    Talks on phone: More than three times a week    Gets together: More than three times a week    Attends religious service: Not on file    Active member of club or organization: Not on file    Attends meetings of clubs or organizations: Not on file    Relationship status: Widowed   Intimate partner violence    Fear of current or ex partner: No    Emotionally abused: No    Physically abused: No    Forced sexual activity: No  Other Topics Concern   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  His wife was a Camera operator and they traveled frequently.  Several nieces and nephews.  He is the youngest of his brothers and sisters and only surviving.    History reviewed. No pertinent family history.   Current Outpatient Medications:    apixaban (ELIQUIS) 5 MG TABS tablet, Take 1 tablet (5 mg total) by mouth as directed. 2 tablets bid x 7 days then 1 tablet bid for rest of prescription, Disp: 74 tablet, Rfl: 0   baclofen (LIORESAL) 10 MG tablet, Take 1 tablet by mouth 1 day or 1 dose., Disp: , Rfl:    ezetimibe (ZETIA) 10 MG tablet, Take 10 mg by mouth every morning. , Disp: , Rfl:    HUMALOG MIX 75/25 KWIKPEN (75-25) 100 UNIT/ML Kwikpen, Inject 4 Units into  the skin every morning., Disp: 15 mL, Rfl: 11   lidocaine-prilocaine (EMLA) cream, Apply to affected area once, Disp: 30 g, Rfl: 3   metoprolol succinate (TOPROL-XL) 50 MG 24 hr tablet, Take 1 tablet by mouth 1 day or 1 dose., Disp: , Rfl:    nystatin (MYCOSTATIN) 100000 UNIT/ML suspension, TAKE 1 TEASPOONSFUL BY MOUTH 4 TIMES DAILY, Disp: 120 mL, Rfl: 1   OLANZapine (ZYPREXA) 10 MG tablet, Take 1 tablet (10 mg total) by mouth at bedtime., Disp: 30 tablet, Rfl: 0   ondansetron (ZOFRAN) 8 MG tablet, TAKE 1 TABLET BY MOUTH TWICE DAILY AS NEEDED  REFRACTORY NAUSEA/VOMITING START ON DAY 3 AFTER CHEMOTHERAPY, Disp: 30 tablet, Rfl: 1   oxyCODONE (OXY IR/ROXICODONE) 5 MG immediate release tablet, Take 1 tablet (5 mg total) by mouth every 6 (six) hours as needed for severe pain., Disp: 60 tablet, Rfl: 0   pantoprazole (PROTONIX) 40 MG tablet, Take 1 tablet (40 mg total) by mouth 2 (two) times daily before a meal., Disp: 60 tablet, Rfl: 0   prochlorperazine (COMPAZINE) 10 MG tablet, TAKE 1 TABLET BY MOUTH EVERY 6 HOURS AS NEEDED NAUSEA AND VOMITING, Disp: 30 tablet, Rfl: 1   Rivaroxaban 15 & 20 MG TBPK, Follow package directions: Take one 15mg  tablet by mouth twice a day. On day 22, switch to one 20mg  tablet once a day. Take with food., Disp: 51 each, Rfl: 0   sucralfate (CARAFATE) 1 GM/10ML suspension, Take 10 mLs (1 g total) by mouth 4 (four) times daily -  with meals and at bedtime., Disp: 420 mL, Rfl: 0   traZODone (DESYREL) 50 MG tablet, Take 50 mg by mouth at bedtime as needed for sleep. , Disp: , Rfl:    potassium chloride (KLOR-CON) 20 MEQ packet, Take 20 mEq by mouth daily. (Patient not taking: Reported on 12/25/2018), Disp: 14 packet, Rfl: 0 No current facility-administered medications for this visit.   Facility-Administered Medications Ordered in Other Visits:    sodium chloride flush (NS) 0.9 % injection 10 mL, 10 mL, Intravenous, Once, Sindy Guadeloupe, MD   sodium chloride flush (NS) 0.9 % injection 10 mL, 10 mL, Intravenous, Once, Sindy Guadeloupe, MD  Physical exam:  Vitals:   12/28/18 0838  BP: (!) 143/73  Pulse: 67  Temp: (!) 95.9 F (35.5 C)  TempSrc: Tympanic  Weight: 148 lb (67.1 kg)  Height: 5\' 4"  (1.626 m)   Physical Exam HENT:     Head: Normocephalic and atraumatic.  Eyes:     Pupils: Pupils are equal, round, and reactive to light.  Neck:     Musculoskeletal: Normal range of motion.  Cardiovascular:     Rate and Rhythm: Normal rate and regular rhythm.     Heart sounds: Normal heart sounds.  Pulmonary:      Effort: Pulmonary effort is normal.     Breath sounds: Normal breath sounds.  Abdominal:     General: Bowel sounds are normal.     Palpations: Abdomen is soft.  Skin:    General: Skin is warm and dry.  Neurological:     Mental Status: He is alert and oriented to person, place, and time.      CMP Latest Ref Rng & Units 12/07/2018  Glucose 70 - 99 mg/dL 100(H)  BUN 8 - 23 mg/dL 8  Creatinine 0.61 - 1.24 mg/dL 0.89  Sodium 135 - 145 mmol/L 140  Potassium 3.5 - 5.1 mmol/L 2.9(L)  Chloride 98 - 111 mmol/L 106  CO2 22 - 32 mmol/L 26  Calcium 8.9 - 10.3 mg/dL 8.8(L)  Total Protein 6.5 - 8.1 g/dL 6.3(L)  Total Bilirubin 0.3 - 1.2 mg/dL 0.3  Alkaline Phos 38 - 126 U/L 100  AST 15 - 41 U/L 21  ALT 0 - 44 U/L 16   CBC Latest Ref Rng & Units 12/28/2018  WBC 4.0 - 10.5 K/uL 5.1  Hemoglobin 13.0 - 17.0 g/dL 9.7(L)  Hematocrit 39.0 - 52.0 % 30.1(L)  Platelets 150 - 400 K/uL 196    No images are attached to the encounter.  Ct Chest W Contrast  Addendum Date: 12/17/2018   ADDENDUM REPORT: 12/17/2018 13:45 ADDENDUM: Addendum for addition of impression: This was mentioned in the body of the original report however not listed in the impression. There is a small nonocclusive filling defect within the central aspect of the right internal jugular vein surrounding Port-A-Cath, favored to represent a small volume of pericatheter thrombus. These results were called by telephone on 12/17/2018 at 1:45 pm to provider [place] , who verbally acknowledged these results. Electronically Signed   By: Lovey Newcomer M.D.   On: 12/17/2018 13:45   Result Date: 12/17/2018 CLINICAL DATA:  Patient with history of rectal carcinoma. Staging exam. EXAM: CT CHEST, ABDOMEN, AND PELVIS WITH CONTRAST TECHNIQUE: Multidetector CT imaging of the chest, abdomen and pelvis was performed following the standard protocol during bolus administration of intravenous contrast. CONTRAST:  2mL OMNIPAQUE IOHEXOL 300 MG/ML  SOLN  COMPARISON:  CT chest 08/07/2018; CT abdomen pelvis 10/10/2018 FINDINGS: CT CHEST FINDINGS Cardiovascular: Right anterior chest wall Port-A-Cath is present with tip terminating in the superior vena cava. Within the central right internal jugular vein, surrounding the catheter, there is a small filling defect (image 4; series 2). Normal heart size. No pericardial effusion. Aorta and main pulmonary artery normal in caliber. Coronary arterial vascular calcifications. Mediastinum/Nodes: No enlarged axillary, mediastinal or hilar lymphadenopathy. Normal appearance of the esophagus. Lungs/Pleura: Central airways are patent. No large area pulmonary consolidation. No pleural effusion or pneumothorax. Stable 2 mm right upper lobe nodule (image 62; series 3). Musculoskeletal: Thoracic spine degenerative changes. No aggressive or acute appearing osseous lesions. CT ABDOMEN PELVIS FINDINGS Hepatobiliary: Subcentimeter too small to characterize low-attenuation lesion hepatic dome (image 47; series 2). Liver is normal in size and contour. Gallbladder is unremarkable. No intrahepatic or extrahepatic biliary ductal dilatation. Pancreas: Unremarkable Spleen: Unremarkable Adrenals/Urinary Tract: Normal adrenal glands. Kidneys enhance symmetrically with contrast. Right renal cyst. Urinary bladder is decompressed. Stomach/Bowel: Descending colonic diverticulosis. No CT evidence for acute diverticulitis. Normal appendix. Normal morphology of the stomach. No evidence for bowel obstruction. The known sigmoid colonic mass is not well visualized on current exam as the sigmoid colon is decompressed. Similar-appearing irregular masslike thickening involving the rectum (image 106-110; series 2). Vascular/Lymphatic: Normal caliber abdominal aorta. Peripheral calcified atherosclerotic plaque. No retroperitoneal lymphadenopathy. Stable 4 mm left perirectal node (image 106; series 4). Reproductive: Status post prostatectomy. Other: Right inguinal  hernia containing a portion of the urinary bladder, similar to prior. Musculoskeletal: Lower thoracic and lumbar spine degenerative changes. No aggressive or acute appearing osseous lesions. IMPRESSION: Similar-appearing irregular wall thickening of the rectum. Previously described sigmoid colonic lesion is not well visualized on current exam. No retroperitoneal or pelvic adenopathy. Colonic diverticulosis. Electronically Signed: By: Lovey Newcomer M.D. On: 12/14/2018 12:48   Ct Abdomen Pelvis W Contrast  Addendum Date: 12/17/2018   ADDENDUM REPORT: 12/17/2018 13:45 ADDENDUM: Addendum for addition of impression: This was mentioned in the  body of the original report however not listed in the impression. There is a small nonocclusive filling defect within the central aspect of the right internal jugular vein surrounding Port-A-Cath, favored to represent a small volume of pericatheter thrombus. These results were called by telephone on 12/17/2018 at 1:45 pm to provider [place] , who verbally acknowledged these results. Electronically Signed   By: Lovey Newcomer M.D.   On: 12/17/2018 13:45   Result Date: 12/17/2018 CLINICAL DATA:  Patient with history of rectal carcinoma. Staging exam. EXAM: CT CHEST, ABDOMEN, AND PELVIS WITH CONTRAST TECHNIQUE: Multidetector CT imaging of the chest, abdomen and pelvis was performed following the standard protocol during bolus administration of intravenous contrast. CONTRAST:  19mL OMNIPAQUE IOHEXOL 300 MG/ML  SOLN COMPARISON:  CT chest 08/07/2018; CT abdomen pelvis 10/10/2018 FINDINGS: CT CHEST FINDINGS Cardiovascular: Right anterior chest wall Port-A-Cath is present with tip terminating in the superior vena cava. Within the central right internal jugular vein, surrounding the catheter, there is a small filling defect (image 4; series 2). Normal heart size. No pericardial effusion. Aorta and main pulmonary artery normal in caliber. Coronary arterial vascular calcifications.  Mediastinum/Nodes: No enlarged axillary, mediastinal or hilar lymphadenopathy. Normal appearance of the esophagus. Lungs/Pleura: Central airways are patent. No large area pulmonary consolidation. No pleural effusion or pneumothorax. Stable 2 mm right upper lobe nodule (image 62; series 3). Musculoskeletal: Thoracic spine degenerative changes. No aggressive or acute appearing osseous lesions. CT ABDOMEN PELVIS FINDINGS Hepatobiliary: Subcentimeter too small to characterize low-attenuation lesion hepatic dome (image 47; series 2). Liver is normal in size and contour. Gallbladder is unremarkable. No intrahepatic or extrahepatic biliary ductal dilatation. Pancreas: Unremarkable Spleen: Unremarkable Adrenals/Urinary Tract: Normal adrenal glands. Kidneys enhance symmetrically with contrast. Right renal cyst. Urinary bladder is decompressed. Stomach/Bowel: Descending colonic diverticulosis. No CT evidence for acute diverticulitis. Normal appendix. Normal morphology of the stomach. No evidence for bowel obstruction. The known sigmoid colonic mass is not well visualized on current exam as the sigmoid colon is decompressed. Similar-appearing irregular masslike thickening involving the rectum (image 106-110; series 2). Vascular/Lymphatic: Normal caliber abdominal aorta. Peripheral calcified atherosclerotic plaque. No retroperitoneal lymphadenopathy. Stable 4 mm left perirectal node (image 106; series 4). Reproductive: Status post prostatectomy. Other: Right inguinal hernia containing a portion of the urinary bladder, similar to prior. Musculoskeletal: Lower thoracic and lumbar spine degenerative changes. No aggressive or acute appearing osseous lesions. IMPRESSION: Similar-appearing irregular wall thickening of the rectum. Previously described sigmoid colonic lesion is not well visualized on current exam. No retroperitoneal or pelvic adenopathy. Colonic diverticulosis. Electronically Signed: By: Lovey Newcomer M.D. On: 12/14/2018  12:48     Assessment and plan- Patient is a 78 y.o. male withstage III rectal adencarcinoma currently undergoing neoadjuvant chemotherapy.  He is s/p 8 cycles of neoadjuvant FOLFOX chemotherapy and here for on treatment assessment prior to day 1 of continuous infusional 5-FU chemotherapy  Given patient's age as well as history of severe esophagitis I did not think patient was a good candidate for Xeloda to be concurrently with radiation.  I would therefore like him to get continuous infusion 5-FU 1000 mg/m/day from day 1 to day 4 and pump disconnect on day 5..  Ideally you would have received continuous infusion without any break over the weekend.  However given patient's age, frailty and risk of cytopenias I would like him to get a break from chemotherapy over the weekend.  I am also dose reducing his 5-FU from 12 52,000 mg per metered square per day.  I will see him back in 1 week's time with CBC with differential, CMP for week 2 of infusional 5-FU  I have reviewed CT chest abdomen and pelvis images independently and discussed findings with the patient.  There is similar irregular thickening seen in the proximal rectum.  However previously noted nodal metastases is not seen on the present CT scan.  I will plan for him to finish concurrent chemoradiation and repeat his scans followed by assessment at [facility] to see if he would be a candidate for surgery at that time   Visit Diagnosis 1. 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[place])   2. Encounter for antineoplastic chemotherapy      Dr. Randa Evens, MD, MPH [facility] at [facility] [id number] 12/28/2018 8:38 AM

## 2018-12-28 NOTE — Progress Notes (Signed)
Nutrition Follow-up:  Patient with stage III rectal adenocarcinoma.  Patient receiving chemotherapy and starting radiation.    Met with patient during infusion this am. Patient reports appetite is still fairly good.  Reports that nephew is helping him with meals.  Reports that he continues to drink boost 2-3 times per day.     Medications: reviewed  Labs: glucose 146  Anthropometrics:   Weight 148 lb today stable from weight of 147 lb on 9/28.   NUTRITION DIAGNOSIS: Inadequate oral intake stable   INTERVENTION:  Continue oral nutrition supplement TID. Encouraged high calorie, high protein foods to maintain weight    MONITORING, EVALUATION, GOAL: Patient will consume adequate calories and protein to prevent further weight loss   NEXT VISIT: as needed  Bethene Hankinson B. Zenia Resides, [place], [place] Registered Dietitian [phone] (pager)

## 2018-12-29 ENCOUNTER — Other Ambulatory Visit: Payer: Self-pay

## 2018-12-29 ENCOUNTER — Ambulatory Visit
Admission: RE | Admit: 2018-12-29 | Discharge: 2018-12-29 | Disposition: A | Discharge status: Home / Self Care | Payer: Medicare Other | Source: Ambulatory Visit | Attending: Radiation Oncology | Admitting: Radiation Oncology

## 2018-12-29 DIAGNOSIS — Z510 Encounter for antineoplastic radiation therapy: Secondary | ICD-10-CM | POA: Diagnosis not present

## 2018-12-30 ENCOUNTER — Other Ambulatory Visit: Payer: Self-pay

## 2018-12-30 ENCOUNTER — Other Ambulatory Visit: Payer: Self-pay | Admitting: Oncology

## 2018-12-30 ENCOUNTER — Ambulatory Visit
Admission: RE | Admit: 2018-12-30 | Discharge: 2018-12-30 | Disposition: A | Discharge status: Home / Self Care | Payer: Medicare Other | Source: Ambulatory Visit | Attending: Radiation Oncology | Admitting: Radiation Oncology

## 2018-12-30 DIAGNOSIS — Z510 Encounter for antineoplastic radiation therapy: Secondary | ICD-10-CM | POA: Diagnosis not present

## 2018-12-30 MED ORDER — LEVOFLOXACIN 500 MG PO TABS: 500.0000 mg | ORAL_TABLET | Freq: Every day | ORAL | 0 refills | Status: DC

## 2018-12-30 NOTE — Progress Notes (Signed)
lvaqui

## 2018-12-31 ENCOUNTER — Inpatient Hospital Stay: Payer: Medicare Other | Admitting: Oncology

## 2018-12-31 ENCOUNTER — Inpatient Hospital Stay: Payer: Medicare Other

## 2018-12-31 ENCOUNTER — Encounter: Payer: Self-pay | Admitting: Oncology

## 2018-12-31 ENCOUNTER — Telehealth: Payer: Self-pay | Admitting: *Deleted

## 2018-12-31 ENCOUNTER — Ambulatory Visit
Admission: RE | Admit: 2018-12-31 | Discharge: 2018-12-31 | Disposition: A | Discharge status: Home / Self Care | Payer: Medicare Other | Source: Ambulatory Visit | Attending: Radiation Oncology | Admitting: Radiation Oncology

## 2018-12-31 ENCOUNTER — Other Ambulatory Visit: Payer: Self-pay

## 2018-12-31 DIAGNOSIS — Z510 Encounter for antineoplastic radiation therapy: Secondary | ICD-10-CM | POA: Diagnosis not present

## 2018-12-31 NOTE — Telephone Encounter (Signed)
Dr Janese Banks wanted to see pt and have labs and possible fluids and d/c pump tom. I have asked the radiation dept. to change his time to get treatment and they changed it to 9:45. Then he will have labs done peripheral and then see /rao and poss infusion and d/c pump. All of this explained to pt and he is agreeable to coming tom. He will go to radiation and then register for lab/ md and infusion.

## 2019-01-01 ENCOUNTER — Inpatient Hospital Stay: Payer: Medicare Other

## 2019-01-01 ENCOUNTER — Inpatient Hospital Stay (HOSPITAL_BASED_OUTPATIENT_CLINIC_OR_DEPARTMENT_OTHER): Payer: Medicare Other | Admitting: Oncology

## 2019-01-01 ENCOUNTER — Ambulatory Visit
Admission: RE | Admit: 2019-01-01 | Discharge: 2019-01-01 | Disposition: A | Discharge status: Home / Self Care | Payer: Medicare Other | Source: Ambulatory Visit | Attending: Radiation Oncology | Admitting: Radiation Oncology

## 2019-01-01 ENCOUNTER — Inpatient Hospital Stay: Payer: Medicare Other | Admitting: *Deleted

## 2019-01-01 ENCOUNTER — Other Ambulatory Visit: Payer: Self-pay

## 2019-01-01 VITALS — BP 132/77 | HR 71 | Temp 97.3°F | Ht 64.0 in | Wt 149.0 lb

## 2019-01-01 DIAGNOSIS — C20 Malignant neoplasm of rectum: Secondary | ICD-10-CM

## 2019-01-01 DIAGNOSIS — Z5111 Encounter for antineoplastic chemotherapy: Secondary | ICD-10-CM | POA: Diagnosis not present

## 2019-01-01 DIAGNOSIS — Z95828 Presence of other vascular implants and grafts: Secondary | ICD-10-CM

## 2019-01-01 DIAGNOSIS — Z510 Encounter for antineoplastic radiation therapy: Secondary | ICD-10-CM | POA: Diagnosis not present

## 2019-01-01 LAB — COMPREHENSIVE METABOLIC PANEL
ALT: 27 U/L (ref 0–44)
AST: 33 U/L (ref 15–41)
Albumin: 3.6 g/dL (ref 3.5–5.0)
Alkaline Phosphatase: 75 U/L (ref 38–126)
Anion gap: 8 (ref 5–15)
BUN: 13 mg/dL (ref 8–23)
CO2: 24 mmol/L (ref 22–32)
Calcium: 9 mg/dL (ref 8.9–10.3)
Chloride: 105 mmol/L (ref 98–111)
Creatinine, Ser: 0.91 mg/dL (ref 0.61–1.24)
GFR calc Af Amer: >60 mL/min (ref >60)
GFR calc non Af Amer: >60 mL/min (ref >60)
Glucose, Bld: 139 mg/dL — ABNORMAL HIGH (ref 70–99)
Potassium: 3.8 mmol/L (ref 3.5–5.1)
Sodium: 137 mmol/L (ref 135–145)
Total Bilirubin: 0.6 mg/dL (ref 0.3–1.2)
Total Protein: 6.3 g/dL — ABNORMAL LOW (ref 6.5–8.1)

## 2019-01-01 LAB — CBC WITH DIFFERENTIAL/PLATELET
Abs Immature Granulocytes: 0.02 10*3/uL (ref 0.00–0.07)
Basophils Absolute: 0 10*3/uL (ref 0.0–0.1)
Basophils Relative: 1 %
Eosinophils Absolute: 0 10*3/uL (ref 0.0–0.5)
Eosinophils Relative: 0 %
HCT: 32.3 % — ABNORMAL LOW (ref 39.0–52.0)
Hemoglobin: 10.4 g/dL — ABNORMAL LOW (ref 13.0–17.0)
Immature Granulocytes: 0 %
Lymphocytes Relative: 20 %
Lymphs Abs: 1 10*3/uL (ref 0.7–4.0)
MCH: 32.2 pg (ref 26.0–34.0)
MCHC: 32.2 g/dL (ref 30.0–36.0)
MCV: 100 fL (ref 80.0–100.0)
Monocytes Absolute: 0.2 10*3/uL (ref 0.1–1.0)
Monocytes Relative: 5 %
Neutro Abs: 3.7 10*3/uL (ref 1.7–7.7)
Neutrophils Relative %: 74 %
Platelets: 206 10*3/uL (ref 150–400)
RBC: 3.23 MIL/uL — ABNORMAL LOW (ref 4.22–5.81)
RDW: 15.8 % — ABNORMAL HIGH (ref 11.5–15.5)
WBC: 4.9 10*3/uL (ref 4.0–10.5)
nRBC: 0 % (ref 0.0–0.2)

## 2019-01-01 MED ORDER — SODIUM CHLORIDE 0.9% FLUSH
10.0000 mL | Freq: Once | INTRAVENOUS | Status: AC
  Administered 2019-01-01: 10 mL via INTRAVENOUS
  Filled 2019-01-01: qty 10

## 2019-01-01 MED ORDER — SODIUM CHLORIDE 0.9% FLUSH
10.0000 mL | INTRAVENOUS | Status: DC | PRN
  Filled 2019-01-01: qty 10

## 2019-01-01 MED ORDER — HEPARIN SOD (PORK) LOCK FLUSH 100 UNIT/ML IV SOLN
500.0000 [IU] | Freq: Once | INTRAVENOUS | Status: AC | PRN
  Administered 2019-01-01: 12:00:00 500 [IU]

## 2019-01-01 MED ORDER — SODIUM CHLORIDE 0.9 % IV SOLN
Freq: Once | INTRAVENOUS | Status: AC
  Administered 2019-01-01: 11:00:00 via INTRAVENOUS
  Filled 2019-01-01: qty 250

## 2019-01-03 NOTE — Progress Notes (Signed)
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 He was recently found to have iron deficiency anemia which led to a colonoscopy On 07/15/2018. Colonoscopy showed a villous partially obstructing medium-sized mass in the mid sigmoid colon. The mass was partially circumferential measuring 5 cm. There was another infiltrative partially obstructing medium-sized mass found in the rectosigmoid colon which also measured 5 cm. Pathology from the sigmoid colon mass showed high-grade dysplasia at least involving an adenomatous lesion with villous features. A more serious process is not excluded. Rectosigmoid mass biopsy showed invasive colorectal adenocarcinoma.  Patient was referred to Dr. [name] for surgical management. Patient had a CT abdomen and pelvis with contrast which showed irregular asymmetric mural thickening of the proximal rectum measuring over 6 to 7 cm. Potential local nodal metastases with several subcentimeter iliac lymph nodes and presacral lymph nodes. No evidence of metastatic disease outside the pelvis.  Patient also had MRI of the pelvis with and without contrast which showed tumor length 8.5 cm with extension through muscularis propria decreased C. There is diffuse involvement of muscularis propria. No extramural vascular invasion or tumor thrombus. Shortest distance of any tumor/note from mesorectal fascia 1 to 2  mm. No involvement of adjacent organs or pelvic sidewall. Mesorectal lymph nodes within perirectal fat including 6 mm node and a low sigmoid mesocolon node measuring 6 mm. Another 6 mm node within the low sigmoid mesocolon representing extremity rectal lymphadenopathy. T3N2 by MRI. Distance from tumor to internal anal sphincter is 6.2 cm.  Plan is to proceed with total neoadjuvant chemotherapy followed by definitive surgery.  Patient completed 8 cycles of neoadjuvant FOLFOX chemotherapy on 12/07/2018.  Interim scans showed similar-appearing irregular wall thickening of the rectum but no retroperitoneal pelvic adenopathy.   Interval history- feels fatigued. He is eating and drinking well. Denies any rectal pain or nausea/vomiting. RT is ongoing  ECOG PS- 1 Pain scale- 0   Review of systems- Review of Systems  Constitutional: Positive for malaise/fatigue. Negative for chills, fever and weight loss.  HENT: Negative for congestion, ear discharge and nosebleeds.   Eyes: Negative for blurred vision.  Respiratory: Negative for cough, hemoptysis, sputum production, shortness of breath and wheezing.   Cardiovascular: Negative for chest pain, palpitations, orthopnea and claudication.  Gastrointestinal: Negative for abdominal pain, blood in stool, constipation, diarrhea, heartburn, melena, nausea and vomiting.  Genitourinary: Negative for dysuria, flank pain, frequency, hematuria and urgency.  Musculoskeletal: Negative for back pain, joint pain and myalgias.  Skin: Negative for rash.  Neurological: Negative for dizziness, tingling, focal weakness, seizures, weakness and headaches.  Endo/Heme/Allergies: Does not bruise/bleed easily.  Psychiatric/Behavioral: Negative for depression and suicidal ideas. The patient does not have insomnia.       No Known Allergies   Past Medical History:  Diagnosis Date  . Anemia   . Coronary artery disease   . Diabetes mellitus without complication ([place])    . History of hiatal hernia   . Hypercholesterolemia   . Hypertension   .  Prostate cancer ([place]) 2000   Prostatectomy.   . 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[place]) 09/2018   Chemo tx's.      Past Surgical History:  Procedure Laterality Date  . COLONOSCOPY WITH PROPOFOL N/A 07/15/2018   Procedure: COLONOSCOPY WITH PROPOFOL;  Surgeon: [name], [name], MD;  Location: [facility] ENDOSCOPY;  Service: Gastroenterology;  Laterality: N/A;  . ESOPHAGOGASTRODUODENOSCOPY N/A 10/12/2018   Procedure: ESOPHAGOGASTRODUODENOSCOPY (EGD);  Surgeon: [name], MD;  Location: [facility] ENDOSCOPY;  Service: Gastroenterology;  Laterality: N/A;  . ESOPHAGOGASTRODUODENOSCOPY (EGD) WITH PROPOFOL N/A 07/15/2018   Procedure: ESOPHAGOGASTRODUODENOSCOPY (EGD) WITH PROPOFOL;  Surgeon: [name], [name], MD;  Location: [facility] ENDOSCOPY;  Service: Gastroenterology;  Laterality: N/A;  . PORTACATH PLACEMENT N/A 08/13/2018   Procedure: INSERTION PORT-A-CATH;  Surgeon: [name], MD;  Location: [facility] ORS;  Service: General;  Laterality: N/A;  . PROSTATE SURGERY      Social History   Socioeconomic History  . Marital status: Widowed    Spouse name: Not on file  . Number of children: 0  . Years of education: Not on file  . Highest education level: Not on file  Occupational History  . Occupation: Pharmacist, hospital    Comment: Retired  Scientific laboratory technician  . Financial resource strain: Not hard at all  . Food insecurity    Worry: Never true    Inability: Never true  . Transportation needs    Medical: No    Non-medical: No  Tobacco Use  . Smoking status: Never Smoker  . Smokeless tobacco: Never Used  Substance and Sexual Activity  . Alcohol use: Not Currently  . Drug use: Never  . Sexual activity: Not Currently  Lifestyle  . Physical activity    Days per week: 2 days    Minutes per session: 30 min  . Stress: Only a little  Relationships  . Social connections    Talks on phone: More than three times a week    Gets together: More than three  times a week    Attends religious service: Not on file    Active member of club or organization: Not on file    Attends meetings of clubs or organizations: Not on file    Relationship status: Widowed  . Intimate partner violence    Fear of current or ex partner: No    Emotionally abused: No    Physically abused: No    Forced sexual activity: No  Other Topics Concern  . 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  His wife was a Camera operator and they traveled frequently.  Several nieces and nephews.  He is the youngest of his brothers and sisters and only surviving.    History reviewed. No pertinent family history.   Current Outpatient Medications:  .  baclofen (LIORESAL) 10 MG tablet, Take 1 tablet by mouth 1 day or 1 dose., Disp: , Rfl:  .  ezetimibe (ZETIA) 10 MG tablet, Take 10 mg by mouth every morning. , Disp: , Rfl:  .  HUMALOG MIX 75/25 KWIKPEN (75-25) 100 UNIT/ML Kwikpen, Inject 4 Units into the skin every morning., Disp: 15 mL, Rfl: 11 .  levofloxacin (LEVAQUIN) 500 MG tablet, Take 1 tablet (500 mg total) by mouth daily., Disp: 30 tablet, Rfl: 0 .  metoprolol succinate (TOPROL-XL) 50 MG 24 hr tablet, Take 1 tablet by mouth 1 day or 1 dose., Disp: ,  Rfl:  .  nystatin (MYCOSTATIN) 100000 UNIT/ML suspension, TAKE 1 TEASPOONSFUL BY MOUTH 4 TIMES DAILY, Disp: 120 mL, Rfl: 1 .  OLANZapine (ZYPREXA) 10 MG tablet, Take 1 tablet (10 mg total) by mouth at bedtime., Disp: 30 tablet, Rfl: 0 .  oxyCODONE (OXY IR/ROXICODONE) 5 MG immediate release tablet, Take 1 tablet (5 mg total) by mouth every 6 (six) hours as needed for severe pain., Disp: 60 tablet, Rfl: 0 .  pantoprazole (PROTONIX) 40 MG tablet, Take 1 tablet (40 mg total) by mouth 2 (two) times daily before a meal., Disp: 60 tablet, Rfl: 0 .  potassium chloride (KLOR-CON) 20 MEQ packet, Take 20 mEq by  mouth daily., Disp: 14 packet, Rfl: 0 .  Rivaroxaban 15 & 20 MG TBPK, Follow package directions: Take one 15mg  tablet by mouth twice a day. On day 22, switch to one 20mg  tablet once a day. Take with food., Disp: 51 each, Rfl: 0 .  sucralfate (CARAFATE) 1 GM/10ML suspension, Take 10 mLs (1 g total) by mouth 4 (four) times daily -  with meals and at bedtime., Disp: 420 mL, Rfl: 0 .  traZODone (DESYREL) 50 MG tablet, Take 50 mg by mouth at bedtime as needed for sleep. , Disp: , Rfl:  No current facility-administered medications for this visit.   Facility-Administered Medications Ordered in Other Visits:  .  sodium chloride flush (NS) 0.9 % injection 10 mL, 10 mL, Intravenous, Once, Sindy Guadeloupe, MD .  sodium chloride flush (NS) 0.9 % injection 10 mL, 10 mL, Intravenous, Once, Sindy Guadeloupe, MD  Physical exam:  Vitals:   01/01/19 1008  BP: 132/77  Pulse: 71  Temp: (!) 97.3 F (36.3 C)  TempSrc: Tympanic  Weight: 149 lb (67.6 kg)  Height: 5\' 4"  (1.626 m)   Physical Exam Constitutional:      Comments: Elderly gentleman who appears mildly fatigued. No acute distress  HENT:     Head: Normocephalic and atraumatic.     Mouth/Throat:     Comments: No mucositis Eyes:     Pupils: Pupils are equal, round, and reactive to light.  Neck:     Musculoskeletal: Normal range of motion.  Cardiovascular:     Rate and Rhythm: Normal rate and regular rhythm.     Heart sounds: Normal heart sounds.  Pulmonary:     Effort: Pulmonary effort is normal.     Breath sounds: Normal breath sounds.  Abdominal:     General: Bowel sounds are normal.     Palpations: Abdomen is soft.  Skin:    General: Skin is warm and dry.  Neurological:     Mental Status: He is alert and oriented to person, place, and time.      CMP Latest Ref Rng & Units 01/01/2019  Glucose 70 - 99 mg/dL 139(H)  BUN 8 - 23 mg/dL 13  Creatinine 0.61 - 1.24 mg/dL 0.91  Sodium 135 - 145 mmol/L 137  Potassium 3.5 - 5.1 mmol/L 3.8   Chloride 98 - 111 mmol/L 105  CO2 22 - 32 mmol/L 24  Calcium 8.9 - 10.3 mg/dL 9.0  Total Protein 6.5 - 8.1 g/dL 6.3(L)  Total Bilirubin 0.3 - 1.2 mg/dL 0.6  Alkaline Phos 38 - 126 U/L 75  AST 15 - 41 U/L 33  ALT 0 - 44 U/L 27   CBC Latest Ref Rng & Units 01/01/2019  WBC 4.0 - 10.5 K/uL 4.9  Hemoglobin 13.0 - 17.0 g/dL 10.4(L)  Hematocrit 39.0 - 52.0 %  32.3(L)  Platelets 150 - 400 K/uL 206    No images are attached to the encounter.  Ct Chest W Contrast  Addendum Date: 12/17/2018   ADDENDUM REPORT: 12/17/2018 13:45 ADDENDUM: Addendum for addition of impression: This was mentioned in the body of the original report however not listed in the impression. There is a small nonocclusive filling defect within the central aspect of the right internal jugular vein surrounding Port-A-Cath, favored to represent a small volume of pericatheter thrombus. These results were called by telephone on 12/17/2018 at 1:45 pm to provider [place] , who verbally acknowledged these results. Electronically Signed   By: Lovey Newcomer M.D.   On: 12/17/2018 13:45   Result Date: 12/17/2018 CLINICAL DATA:  Patient with history of rectal carcinoma. Staging exam. EXAM: CT CHEST, ABDOMEN, AND PELVIS WITH CONTRAST TECHNIQUE: Multidetector CT imaging of the chest, abdomen and pelvis was performed following the standard protocol during bolus administration of intravenous contrast. CONTRAST:  22mL OMNIPAQUE IOHEXOL 300 MG/ML  SOLN COMPARISON:  CT chest 08/07/2018; CT abdomen pelvis 10/10/2018 FINDINGS: CT CHEST FINDINGS Cardiovascular: Right anterior chest wall Port-A-Cath is present with tip terminating in the superior vena cava. Within the central right internal jugular vein, surrounding the catheter, there is a small filling defect (image 4; series 2). Normal heart size. No pericardial effusion. Aorta and main pulmonary artery normal in caliber. Coronary arterial vascular calcifications. Mediastinum/Nodes: No enlarged axillary,  mediastinal or hilar lymphadenopathy. Normal appearance of the esophagus. Lungs/Pleura: Central airways are patent. No large area pulmonary consolidation. No pleural effusion or pneumothorax. Stable 2 mm right upper lobe nodule (image 62; series 3). Musculoskeletal: Thoracic spine degenerative changes. No aggressive or acute appearing osseous lesions. CT ABDOMEN PELVIS FINDINGS Hepatobiliary: Subcentimeter too small to characterize low-attenuation lesion hepatic dome (image 47; series 2). Liver is normal in size and contour. Gallbladder is unremarkable. No intrahepatic or extrahepatic biliary ductal dilatation. Pancreas: Unremarkable Spleen: Unremarkable Adrenals/Urinary Tract: Normal adrenal glands. Kidneys enhance symmetrically with contrast. Right renal cyst. Urinary bladder is decompressed. Stomach/Bowel: Descending colonic diverticulosis. No CT evidence for acute diverticulitis. Normal appendix. Normal morphology of the stomach. No evidence for bowel obstruction. The known sigmoid colonic mass is not well visualized on current exam as the sigmoid colon is decompressed. Similar-appearing irregular masslike thickening involving the rectum (image 106-110; series 2). Vascular/Lymphatic: Normal caliber abdominal aorta. Peripheral calcified atherosclerotic plaque. No retroperitoneal lymphadenopathy. Stable 4 mm left perirectal node (image 106; series 4). Reproductive: Status post prostatectomy. Other: Right inguinal hernia containing a portion of the urinary bladder, similar to prior. Musculoskeletal: Lower thoracic and lumbar spine degenerative changes. No aggressive or acute appearing osseous lesions. IMPRESSION: Similar-appearing irregular wall thickening of the rectum. Previously described sigmoid colonic lesion is not well visualized on current exam. No retroperitoneal or pelvic adenopathy. Colonic diverticulosis. Electronically Signed: By: Lovey Newcomer M.D. On: 12/14/2018 12:48   Ct Abdomen Pelvis W Contrast   Addendum Date: 12/17/2018   ADDENDUM REPORT: 12/17/2018 13:45 ADDENDUM: Addendum for addition of impression: This was mentioned in the body of the original report however not listed in the impression. There is a small nonocclusive filling defect within the central aspect of the right internal jugular vein surrounding Port-A-Cath, favored to represent a small volume of pericatheter thrombus. These results were called by telephone on 12/17/2018 at 1:45 pm to provider [place] , who verbally acknowledged these results. Electronically Signed   By: Lovey Newcomer M.D.   On: 12/17/2018 13:45   Result Date: 12/17/2018 CLINICAL  DATA:  Patient with history of rectal carcinoma. Staging exam. EXAM: CT CHEST, ABDOMEN, AND PELVIS WITH CONTRAST TECHNIQUE: Multidetector CT imaging of the chest, abdomen and pelvis was performed following the standard protocol during bolus administration of intravenous contrast. CONTRAST:  11mL OMNIPAQUE IOHEXOL 300 MG/ML  SOLN COMPARISON:  CT chest 08/07/2018; CT abdomen pelvis 10/10/2018 FINDINGS: CT CHEST FINDINGS Cardiovascular: Right anterior chest wall Port-A-Cath is present with tip terminating in the superior vena cava. Within the central right internal jugular vein, surrounding the catheter, there is a small filling defect (image 4; series 2). Normal heart size. No pericardial effusion. Aorta and main pulmonary artery normal in caliber. Coronary arterial vascular calcifications. Mediastinum/Nodes: No enlarged axillary, mediastinal or hilar lymphadenopathy. Normal appearance of the esophagus. Lungs/Pleura: Central airways are patent. No large area pulmonary consolidation. No pleural effusion or pneumothorax. Stable 2 mm right upper lobe nodule (image 62; series 3). Musculoskeletal: Thoracic spine degenerative changes. No aggressive or acute appearing osseous lesions. CT ABDOMEN PELVIS FINDINGS Hepatobiliary: Subcentimeter too small to characterize low-attenuation lesion hepatic dome (image  47; series 2). Liver is normal in size and contour. Gallbladder is unremarkable. No intrahepatic or extrahepatic biliary ductal dilatation. Pancreas: Unremarkable Spleen: Unremarkable Adrenals/Urinary Tract: Normal adrenal glands. Kidneys enhance symmetrically with contrast. Right renal cyst. Urinary bladder is decompressed. Stomach/Bowel: Descending colonic diverticulosis. No CT evidence for acute diverticulitis. Normal appendix. Normal morphology of the stomach. No evidence for bowel obstruction. The known sigmoid colonic mass is not well visualized on current exam as the sigmoid colon is decompressed. Similar-appearing irregular masslike thickening involving the rectum (image 106-110; series 2). Vascular/Lymphatic: Normal caliber abdominal aorta. Peripheral calcified atherosclerotic plaque. No retroperitoneal lymphadenopathy. Stable 4 mm left perirectal node (image 106; series 4). Reproductive: Status post prostatectomy. Other: Right inguinal hernia containing a portion of the urinary bladder, similar to prior. Musculoskeletal: Lower thoracic and lumbar spine degenerative changes. No aggressive or acute appearing osseous lesions. IMPRESSION: Similar-appearing irregular wall thickening of the rectum. Previously described sigmoid colonic lesion is not well visualized on current exam. No retroperitoneal or pelvic adenopathy. Colonic diverticulosis. Electronically Signed: By: Lovey Newcomer M.D. On: 12/14/2018 12:48     Assessment and plan- Patient is a 77 y.o. male withstage III rectal adencarcinoma currently undergoing neoadjuvant chemotherapy.  He is s/p 8 cycles of neoadjuvant FOLFOX chemotherapy. He is currently undergoing concurrent chemo/RT and is here for routine f/u of rectal cancer  Patient received continuous 81fu at 1000mg /meter square per day day 1- day 4 and will have his pump disconnected today. Since he is getting the higher dose of fu, he will receive this week 1 and week 5 of RT if his counts  hold up.  He is at risk of neutropenic fever and since he cannot get neulasta- I have started him on levaquin prophylaxis. I will give him 1L of fluids today and he will receive possible fluids twice weekly starting next week with weekly counts. I will see him back in 1 week with cbc with diff/ cmp.   Visit Diagnosis 1. Rectal cancer [facility])      Dr. Randa Evens, MD, MPH [facility] [id number] 01/03/2019 6:31 PM

## 2019-01-04 ENCOUNTER — Inpatient Hospital Stay: Payer: Medicare Other | Admitting: Oncology

## 2019-01-04 ENCOUNTER — Other Ambulatory Visit: Payer: Self-pay

## 2019-01-04 ENCOUNTER — Inpatient Hospital Stay: Payer: Medicare Other

## 2019-01-04 ENCOUNTER — Ambulatory Visit
Admission: RE | Admit: 2019-01-04 | Discharge: 2019-01-04 | Disposition: A | Discharge status: Home / Self Care | Payer: Medicare Other | Source: Ambulatory Visit | Attending: Radiation Oncology | Admitting: Radiation Oncology

## 2019-01-04 DIAGNOSIS — Z510 Encounter for antineoplastic radiation therapy: Secondary | ICD-10-CM | POA: Diagnosis not present

## 2019-01-05 ENCOUNTER — Ambulatory Visit
Admission: RE | Admit: 2019-01-05 | Discharge: 2019-01-05 | Disposition: A | Discharge status: Home / Self Care | Payer: Medicare Other | Source: Ambulatory Visit | Attending: Radiation Oncology | Admitting: Radiation Oncology

## 2019-01-05 ENCOUNTER — Inpatient Hospital Stay: Payer: Medicare Other

## 2019-01-05 ENCOUNTER — Other Ambulatory Visit: Payer: Self-pay | Admitting: *Deleted

## 2019-01-05 ENCOUNTER — Other Ambulatory Visit: Payer: Self-pay

## 2019-01-05 DIAGNOSIS — C20 Malignant neoplasm of rectum: Secondary | ICD-10-CM

## 2019-01-05 DIAGNOSIS — E876 Hypokalemia: Secondary | ICD-10-CM

## 2019-01-05 DIAGNOSIS — Z510 Encounter for antineoplastic radiation therapy: Secondary | ICD-10-CM | POA: Diagnosis not present

## 2019-01-05 DIAGNOSIS — Z5111 Encounter for antineoplastic chemotherapy: Secondary | ICD-10-CM | POA: Diagnosis not present

## 2019-01-05 LAB — COMPREHENSIVE METABOLIC PANEL
ALT: 15 U/L (ref 0–44)
AST: 18 U/L (ref 15–41)
Albumin: 3.5 g/dL (ref 3.5–5.0)
Alkaline Phosphatase: 65 U/L (ref 38–126)
Anion gap: 7 (ref 5–15)
BUN: 14 mg/dL (ref 8–23)
CO2: 23 mmol/L (ref 22–32)
Calcium: 8.6 mg/dL — ABNORMAL LOW (ref 8.9–10.3)
Chloride: 106 mmol/L (ref 98–111)
Creatinine, Ser: 0.94 mg/dL (ref 0.61–1.24)
GFR calc Af Amer: >60 mL/min (ref >60)
GFR calc non Af Amer: >60 mL/min (ref >60)
Glucose, Bld: 151 mg/dL — ABNORMAL HIGH (ref 70–99)
Potassium: 3.4 mmol/L — ABNORMAL LOW (ref 3.5–5.1)
Sodium: 136 mmol/L (ref 135–145)
Total Bilirubin: 0.6 mg/dL (ref 0.3–1.2)
Total Protein: 6.3 g/dL — ABNORMAL LOW (ref 6.5–8.1)

## 2019-01-05 LAB — CBC WITH DIFFERENTIAL/PLATELET
Abs Immature Granulocytes: 0.04 10*3/uL (ref 0.00–0.07)
Basophils Absolute: 0 10*3/uL (ref 0.0–0.1)
Basophils Relative: 1 %
Eosinophils Absolute: 0 10*3/uL (ref 0.0–0.5)
Eosinophils Relative: 1 %
HCT: 28.5 % — ABNORMAL LOW (ref 39.0–52.0)
Hemoglobin: 9.4 g/dL — ABNORMAL LOW (ref 13.0–17.0)
Immature Granulocytes: 1 %
Lymphocytes Relative: 24 %
Lymphs Abs: 0.7 10*3/uL (ref 0.7–4.0)
MCH: 32.3 pg (ref 26.0–34.0)
MCHC: 33 g/dL (ref 30.0–36.0)
MCV: 97.9 fL (ref 80.0–100.0)
Monocytes Absolute: 0.2 10*3/uL (ref 0.1–1.0)
Monocytes Relative: 8 %
Neutro Abs: 1.9 10*3/uL (ref 1.7–7.7)
Neutrophils Relative %: 65 %
Platelets: 186 10*3/uL (ref 150–400)
RBC: 2.91 MIL/uL — ABNORMAL LOW (ref 4.22–5.81)
RDW: 14.9 % (ref 11.5–15.5)
WBC: 3 10*3/uL — ABNORMAL LOW (ref 4.0–10.5)
nRBC: 0 % (ref 0.0–0.2)

## 2019-01-05 MED ORDER — SODIUM CHLORIDE 0.9 % IV SOLN
Freq: Once | INTRAVENOUS | Status: DC
  Filled 2019-01-05: qty 250

## 2019-01-05 MED ORDER — HEPARIN SOD (PORK) LOCK FLUSH 100 UNIT/ML IV SOLN
500.0000 [IU] | Freq: Once | INTRAVENOUS | Status: AC
  Administered 2019-01-05: 500 [IU] via INTRAVENOUS
  Filled 2019-01-05: qty 5

## 2019-01-05 MED ORDER — SODIUM CHLORIDE 0.9 % IV SOLN
Freq: Once | INTRAVENOUS | Status: AC
  Administered 2019-01-05: 10:00:00 via INTRAVENOUS
  Filled 2019-01-05: qty 1000

## 2019-01-06 ENCOUNTER — Other Ambulatory Visit: Payer: Self-pay | Admitting: *Deleted

## 2019-01-06 ENCOUNTER — Ambulatory Visit
Admission: RE | Admit: 2019-01-06 | Discharge: 2019-01-06 | Disposition: A | Discharge status: Home / Self Care | Payer: Medicare Other | Source: Ambulatory Visit | Attending: Radiation Oncology | Admitting: Radiation Oncology

## 2019-01-06 ENCOUNTER — Other Ambulatory Visit: Payer: Self-pay

## 2019-01-06 DIAGNOSIS — Z510 Encounter for antineoplastic radiation therapy: Secondary | ICD-10-CM | POA: Diagnosis not present

## 2019-01-06 MED ORDER — SUCRALFATE 1 G PO TABS: 1.0000 g | ORAL_TABLET | Freq: Two times a day (BID) | ORAL | 2 refills | Status: DC

## 2019-01-07 ENCOUNTER — Ambulatory Visit
Admission: RE | Admit: 2019-01-07 | Discharge: 2019-01-07 | Disposition: A | Discharge status: Home / Self Care | Payer: Medicare Other | Source: Ambulatory Visit | Attending: Radiation Oncology | Admitting: Radiation Oncology

## 2019-01-07 ENCOUNTER — Other Ambulatory Visit: Payer: Self-pay

## 2019-01-07 ENCOUNTER — Encounter: Payer: Self-pay | Admitting: Oncology

## 2019-01-07 DIAGNOSIS — Z510 Encounter for antineoplastic radiation therapy: Secondary | ICD-10-CM | POA: Diagnosis not present

## 2019-01-07 NOTE — Progress Notes (Signed)
Patient stated that he currently has mouth sores and even though he uses a mouth wash that Dr. Baruch Gouty prescribed, it is not helping much. Patient stated that it is hard to chew and swallow. Patient stated that even though he is hungry, it gets hard to eat due to his mouth sores.

## 2019-01-08 ENCOUNTER — Inpatient Hospital Stay: Payer: Medicare Other

## 2019-01-08 ENCOUNTER — Ambulatory Visit
Admission: RE | Admit: 2019-01-08 | Discharge: 2019-01-08 | Disposition: A | Discharge status: Home / Self Care | Payer: Medicare Other | Source: Ambulatory Visit | Attending: Radiation Oncology | Admitting: Radiation Oncology

## 2019-01-08 ENCOUNTER — Other Ambulatory Visit: Payer: Self-pay

## 2019-01-08 ENCOUNTER — Inpatient Hospital Stay (HOSPITAL_BASED_OUTPATIENT_CLINIC_OR_DEPARTMENT_OTHER): Payer: Medicare Other | Admitting: Oncology

## 2019-01-08 ENCOUNTER — Inpatient Hospital Stay: Payer: Medicare Other | Admitting: Oncology

## 2019-01-08 VITALS — BP 119/66 | HR 67 | Temp 97.3°F | Ht 64.0 in | Wt 145.0 lb

## 2019-01-08 DIAGNOSIS — C20 Malignant neoplasm of rectum: Secondary | ICD-10-CM | POA: Diagnosis not present

## 2019-01-08 DIAGNOSIS — Z5111 Encounter for antineoplastic chemotherapy: Secondary | ICD-10-CM | POA: Diagnosis not present

## 2019-01-08 DIAGNOSIS — Z510 Encounter for antineoplastic radiation therapy: Secondary | ICD-10-CM | POA: Diagnosis not present

## 2019-01-08 MED ORDER — MAGIC MOUTHWASH W/LIDOCAINE: 5.0000 mL | Freq: Four times a day (QID) | ORAL | 1 refills | Status: DC | PRN

## 2019-01-11 ENCOUNTER — Ambulatory Visit
Admission: RE | Admit: 2019-01-11 | Discharge: 2019-01-11 | Disposition: A | Discharge status: Home / Self Care | Payer: Medicare Other | Source: Ambulatory Visit | Attending: Radiation Oncology | Admitting: Radiation Oncology

## 2019-01-11 ENCOUNTER — Other Ambulatory Visit: Payer: Self-pay

## 2019-01-11 DIAGNOSIS — C20 Malignant neoplasm of rectum: Secondary | ICD-10-CM | POA: Diagnosis present

## 2019-01-11 DIAGNOSIS — Z510 Encounter for antineoplastic radiation therapy: Secondary | ICD-10-CM | POA: Diagnosis not present

## 2019-01-11 NOTE — Progress Notes (Signed)
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 He was recently found to have iron deficiency anemia which led to a colonoscopy On 07/15/2018. Colonoscopy showed a villous partially obstructing medium-sized mass in the mid sigmoid colon. The mass was partially circumferential measuring 5 cm. There was another infiltrative partially obstructing medium-sized mass found in the rectosigmoid colon which also measured 5 cm. Pathology from the sigmoid colon mass showed high-grade dysplasia at least involving an adenomatous lesion with villous features. A more serious process is not excluded. Rectosigmoid mass biopsy showed invasive colorectal adenocarcinoma.  Patient was referred to Dr. [name] for surgical management. Patient had a CT abdomen and pelvis with contrast which showed irregular asymmetric mural thickening of the proximal rectum measuring over 6 to 7 cm. Potential local nodal metastases with several subcentimeter iliac lymph nodes and presacral lymph nodes. No evidence of metastatic disease outside the pelvis.  Patient also had MRI of the pelvis with and without contrast which showed tumor length 8.5 cm with extension through muscularis propria decreased C. There is diffuse involvement of muscularis propria. No extramural vascular invasion or tumor thrombus. Shortest distance of any tumor/note from mesorectal fascia 1 to  2 mm. No involvement of adjacent organs or pelvic sidewall. Mesorectal lymph nodes within perirectal fat including 6 mm node and a low sigmoid mesocolon node measuring 6 mm. Another 6 mm node within the low sigmoid mesocolon representing extremity rectal lymphadenopathy. T3N2 by MRI. Distance from tumor to internal anal sphincter is 6.2 cm.  Plan is to proceed with total neoadjuvant chemotherapy followed by definitive surgery.Patient completed 8 cycles of neoadjuvant FOLFOX chemotherapy on 12/07/2018. Interim scans showed similar-appearing irregular wall thickening of the rectum but no retroperitoneal pelvic adenopathy.   Interval history-he has lost 3 pounds over the last 2 weeks.  Reports that he is trying to keep up with his oral intake.  There are days he does not feel like eating but there are days when he cooks for himself and needs a good meal.  Denies any nausea or vomiting.  Denies any mouth sores but has occasional pain during swallowing  ECOG PS- 1 Pain scale- 0   Review of systems- Review of Systems  Constitutional: Positive for malaise/fatigue and weight loss. Negative for chills and fever.  HENT: Negative for congestion, ear discharge and nosebleeds.   Eyes: Negative for blurred vision.  Respiratory: Negative for cough, hemoptysis, sputum production, shortness of breath and wheezing.   Cardiovascular: Negative for chest pain, palpitations, orthopnea and claudication.  Gastrointestinal: Negative for abdominal pain, blood in stool, constipation, diarrhea, heartburn, melena, nausea and vomiting.  Genitourinary: Negative for dysuria, flank pain, frequency, hematuria and urgency.  Musculoskeletal: Negative for back pain, joint pain and myalgias.  Skin: Negative for rash.  Neurological: Negative for dizziness, tingling, focal weakness, seizures, weakness and headaches.  Endo/Heme/Allergies: Does not bruise/bleed easily.  Psychiatric/Behavioral: Negative for depression and  suicidal ideas. The patient does not have insomnia.       No Known  Allergies   Past Medical History:  Diagnosis Date  . Anemia   . Coronary artery disease   . Diabetes mellitus without complication ([place])   . History of hiatal hernia   . Hypercholesterolemia   . Hypertension   . Prostate cancer ([place]) 2000   Prostatectomy.   . 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[place]) 09/2018   Chemo tx's.      Past Surgical History:  Procedure Laterality Date  . COLONOSCOPY WITH PROPOFOL N/A 07/15/2018   Procedure: COLONOSCOPY WITH PROPOFOL;  Surgeon: [name], [name], MD;  Location: [facility] ENDOSCOPY;  Service: Gastroenterology;  Laterality: N/A;  . ESOPHAGOGASTRODUODENOSCOPY N/A 10/12/2018   Procedure: ESOPHAGOGASTRODUODENOSCOPY (EGD);  Surgeon: [name], MD;  Location: [facility] ENDOSCOPY;  Service: Gastroenterology;  Laterality: N/A;  . ESOPHAGOGASTRODUODENOSCOPY (EGD) WITH PROPOFOL N/A 07/15/2018   Procedure: ESOPHAGOGASTRODUODENOSCOPY (EGD) WITH PROPOFOL;  Surgeon: [name], [name], MD;  Location: [facility] ENDOSCOPY;  Service: Gastroenterology;  Laterality: N/A;  . PORTACATH PLACEMENT N/A 08/13/2018   Procedure: INSERTION PORT-A-CATH;  Surgeon: [name], MD;  Location: [facility] ORS;  Service: General;  Laterality: N/A;  . PROSTATE SURGERY      Social History   Socioeconomic History  . Marital status: Widowed    Spouse name: Not on file  . Number of children: 0  . Years of education: Not on file  . Highest education level: Not on file  Occupational History  . Occupation: Pharmacist, hospital    Comment: Retired  Scientific laboratory technician  . Financial resource strain: Not hard at all  . Food insecurity    Worry: Never true    Inability: Never true  . Transportation needs    Medical: No    Non-medical: No  Tobacco Use  . Smoking status: Never Smoker  . Smokeless tobacco: Never Used  Substance and Sexual Activity  . Alcohol use: Not Currently  . Drug use: Never  . Sexual activity: Not Currently  Lifestyle  .  Physical activity    Days per week: 2 days    Minutes per session: 30 min  . Stress: Only a little  Relationships  . Social connections    Talks on phone: More than three times a week    Gets together: More than three times a week    Attends religious service: Not on file    Active member of club or organization: Not on file    Attends meetings of clubs or organizations: Not on file    Relationship status: Widowed  . Intimate partner violence    Fear of current or ex partner: No    Emotionally abused: No    Physically abused: No    Forced sexual activity: No  Other Topics Concern  . 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  His wife was a Camera operator and they traveled frequently.  Several nieces and nephews.  He is the youngest of his brothers and sisters and only surviving.    History reviewed. No pertinent family history.   Current Outpatient Medications:  .  baclofen (LIORESAL) 10 MG tablet, Take 1 tablet by mouth 1 day or 1 dose., Disp: , Rfl:  .  ezetimibe (ZETIA) 10 MG tablet, Take 10 mg by mouth every morning. , Disp: , Rfl:  .  HUMALOG MIX 75/25 KWIKPEN (75-25) 100 UNIT/ML Kwikpen, Inject 4 Units into the skin every morning., Disp: 15 mL, Rfl:  11 .  levofloxacin (LEVAQUIN) 500 MG tablet, Take 1 tablet (500 mg total) by mouth daily., Disp: 30 tablet, Rfl: 0 .  metoprolol succinate (TOPROL-XL) 50 MG 24 hr tablet, Take 1 tablet by mouth 1 day or 1 dose., Disp: , Rfl:  .  nystatin (MYCOSTATIN) 100000 UNIT/ML suspension, TAKE 1 TEASPOONSFUL BY MOUTH 4 TIMES DAILY, Disp: 120 mL, Rfl: 1 .  OLANZapine (ZYPREXA) 10 MG tablet, Take 1 tablet (10 mg total) by mouth at bedtime., Disp: 30 tablet, Rfl: 0 .  oxyCODONE (OXY IR/ROXICODONE) 5 MG immediate release tablet, Take 1 tablet (5 mg total) by mouth every 6 (six) hours as needed for severe pain.,  Disp: 60 tablet, Rfl: 0 .  pantoprazole (PROTONIX) 40 MG tablet, Take 1 tablet (40 mg total) by mouth 2 (two) times daily before a meal., Disp: 60 tablet, Rfl: 0 .  sucralfate (CARAFATE) 1 g tablet, Take 1 tablet (1 g total) by mouth 2 (two) times daily. Dissolve in 3-4 tbsp warm water, swish and swallow, Disp: 60 tablet, Rfl: 2 .  traZODone (DESYREL) 50 MG tablet, Take 50 mg by mouth at bedtime as needed for sleep. , Disp: , Rfl:  .  magic mouthwash w/lidocaine SOLN, Take 5 mLs by mouth 4 (four) times daily as needed for mouth pain., Disp: 480 mL, Rfl: 1 No current facility-administered medications for this visit.   Facility-Administered Medications Ordered in Other Visits:  .  sodium chloride flush (NS) 0.9 % injection 10 mL, 10 mL, Intravenous, Once, Sindy Guadeloupe, MD .  sodium chloride flush (NS) 0.9 % injection 10 mL, 10 mL, Intravenous, Once, Sindy Guadeloupe, MD  Physical exam:  Vitals:   01/08/19 0900  BP: 119/66  Pulse: 67  Temp: (!) 97.3 F (36.3 C)  TempSrc: Tympanic  Weight: 145 lb (65.8 kg)  Height: 5\' 4"  (1.626 m)   Physical Exam Constitutional:      General: He is not in acute distress. HENT:     Head: Normocephalic and atraumatic.     Mouth/Throat:     Mouth: Mucous membranes are moist.     Pharynx: Oropharynx is clear.  Eyes:     Pupils: Pupils are equal, round, and reactive to light.  Neck:     Musculoskeletal: Normal range of motion.  Cardiovascular:     Rate and Rhythm: Normal rate and regular rhythm.     Heart sounds: Normal heart sounds.  Pulmonary:     Effort: Pulmonary effort is normal.     Breath sounds: Normal breath sounds.  Abdominal:     General: Bowel sounds are normal.     Palpations: Abdomen is soft.  Skin:    General: Skin is warm and dry.  Neurological:     Mental Status: He is alert and oriented to person, place, and time.      CMP Latest Ref Rng & Units 01/05/2019  Glucose 70 - 99 mg/dL 151(H)  BUN 8 - 23 mg/dL 14  Creatinine  0.61 - 1.24 mg/dL 0.94  Sodium 135 - 145 mmol/L 136  Potassium 3.5 - 5.1 mmol/L 3.4(L)  Chloride 98 - 111 mmol/L 106  CO2 22 - 32 mmol/L 23  Calcium 8.9 - 10.3 mg/dL 8.6(L)  Total Protein 6.5 - 8.1 g/dL 6.3(L)  Total Bilirubin 0.3 - 1.2 mg/dL 0.6  Alkaline Phos 38 - 126 U/L 65  AST 15 - 41 U/L 18  ALT 0 - 44 U/L 15   CBC Latest Ref Rng & Units  01/05/2019  WBC 4.0 - 10.5 K/uL 3.0(L)  Hemoglobin 13.0 - 17.0 g/dL 9.4(L)  Hematocrit 39.0 - 52.0 % 28.5(L)  Platelets 150 - 400 K/uL 186    No images are attached to the encounter.  Ct Chest W Contrast  Addendum Date: 12/17/2018   ADDENDUM REPORT: 12/17/2018 13:45 ADDENDUM: Addendum for addition of impression: This was mentioned in the body of the original report however not listed in the impression. There is a small nonocclusive filling defect within the central aspect of the right internal jugular vein surrounding Port-A-Cath, favored to represent a small volume of pericatheter thrombus. These results were called by telephone on 12/17/2018 at 1:45 pm to provider Powers , who verbally acknowledged these results. Electronically Signed   By: Lovey Newcomer M.D.   On: 12/17/2018 13:45   Result Date: 12/17/2018 CLINICAL DATA:  Patient with history of rectal carcinoma. Staging exam. EXAM: CT CHEST, ABDOMEN, AND PELVIS WITH CONTRAST TECHNIQUE: Multidetector CT imaging of the chest, abdomen and pelvis was performed following the standard protocol during bolus administration of intravenous contrast. CONTRAST:  16mL OMNIPAQUE IOHEXOL 300 MG/ML  SOLN COMPARISON:  CT chest 08/07/2018; CT abdomen pelvis 10/10/2018 FINDINGS: CT CHEST FINDINGS Cardiovascular: Right anterior chest wall Port-A-Cath is present with tip terminating in the superior vena cava. Within the central right internal jugular vein, surrounding the catheter, there is a small filling defect (image 4; series 2). Normal heart size. No pericardial effusion. Aorta and main pulmonary artery normal  in caliber. Coronary arterial vascular calcifications. Mediastinum/Nodes: No enlarged axillary, mediastinal or hilar lymphadenopathy. Normal appearance of the esophagus. Lungs/Pleura: Central airways are patent. No large area pulmonary consolidation. No pleural effusion or pneumothorax. Stable 2 mm right upper lobe nodule (image 62; series 3). Musculoskeletal: Thoracic spine degenerative changes. No aggressive or acute appearing osseous lesions. CT ABDOMEN PELVIS FINDINGS Hepatobiliary: Subcentimeter too small to characterize low-attenuation lesion hepatic dome (image 47; series 2). Liver is normal in size and contour. Gallbladder is unremarkable. No intrahepatic or extrahepatic biliary ductal dilatation. Pancreas: Unremarkable Spleen: Unremarkable Adrenals/Urinary Tract: Normal adrenal glands. Kidneys enhance symmetrically with contrast. Right renal cyst. Urinary bladder is decompressed. Stomach/Bowel: Descending colonic diverticulosis. No CT evidence for acute diverticulitis. Normal appendix. Normal morphology of the stomach. No evidence for bowel obstruction. The known sigmoid colonic mass is not well visualized on current exam as the sigmoid colon is decompressed. Similar-appearing irregular masslike thickening involving the rectum (image 106-110; series 2). Vascular/Lymphatic: Normal caliber abdominal aorta. Peripheral calcified atherosclerotic plaque. No retroperitoneal lymphadenopathy. Stable 4 mm left perirectal node (image 106; series 4). Reproductive: Status post prostatectomy. Other: Right inguinal hernia containing a portion of the urinary bladder, similar to prior. Musculoskeletal: Lower thoracic and lumbar spine degenerative changes. No aggressive or acute appearing osseous lesions. IMPRESSION: Similar-appearing irregular wall thickening of the rectum. Previously described sigmoid colonic lesion is not well visualized on current exam. No retroperitoneal or pelvic adenopathy. Colonic diverticulosis.  Electronically Signed: By: Lovey Newcomer M.D. On: 12/14/2018 12:48   Ct Abdomen Pelvis W Contrast  Addendum Date: 12/17/2018   ADDENDUM REPORT: 12/17/2018 13:45 ADDENDUM: Addendum for addition of impression: This was mentioned in the body of the original report however not listed in the impression. There is a small nonocclusive filling defect within the central aspect of the right internal jugular vein surrounding Port-A-Cath, favored to represent a small volume of pericatheter thrombus. These results were called by telephone on 12/17/2018 at 1:45 pm to provider [facility] Nour Scalise , who verbally acknowledged these  results. Electronically Signed   By: Lovey Newcomer M.D.   On: 12/17/2018 13:45   Result Date: 12/17/2018 CLINICAL DATA:  Patient with history of rectal carcinoma. Staging exam. EXAM: CT CHEST, ABDOMEN, AND PELVIS WITH CONTRAST TECHNIQUE: Multidetector CT imaging of the chest, abdomen and pelvis was performed following the standard protocol during bolus administration of intravenous contrast. CONTRAST:  73mL OMNIPAQUE IOHEXOL 300 MG/ML  SOLN COMPARISON:  CT chest 08/07/2018; CT abdomen pelvis 10/10/2018 FINDINGS: CT CHEST FINDINGS Cardiovascular: Right anterior chest wall Port-A-Cath is present with tip terminating in the superior vena cava. Within the central right internal jugular vein, surrounding the catheter, there is a small filling defect (image 4; series 2). Normal heart size. No pericardial effusion. Aorta and main pulmonary artery normal in caliber. Coronary arterial vascular calcifications. Mediastinum/Nodes: No enlarged axillary, mediastinal or hilar lymphadenopathy. Normal appearance of the esophagus. Lungs/Pleura: Central airways are patent. No large area pulmonary consolidation. No pleural effusion or pneumothorax. Stable 2 mm right upper lobe nodule (image 62; series 3). Musculoskeletal: Thoracic spine degenerative changes. No aggressive or acute appearing osseous lesions. CT ABDOMEN PELVIS  FINDINGS Hepatobiliary: Subcentimeter too small to characterize low-attenuation lesion hepatic dome (image 47; series 2). Liver is normal in size and contour. Gallbladder is unremarkable. No intrahepatic or extrahepatic biliary ductal dilatation. Pancreas: Unremarkable Spleen: Unremarkable Adrenals/Urinary Tract: Normal adrenal glands. Kidneys enhance symmetrically with contrast. Right renal cyst. Urinary bladder is decompressed. Stomach/Bowel: Descending colonic diverticulosis. No CT evidence for acute diverticulitis. Normal appendix. Normal morphology of the stomach. No evidence for bowel obstruction. The known sigmoid colonic mass is not well visualized on current exam as the sigmoid colon is decompressed. Similar-appearing irregular masslike thickening involving the rectum (image 106-110; series 2). Vascular/Lymphatic: Normal caliber abdominal aorta. Peripheral calcified atherosclerotic plaque. No retroperitoneal lymphadenopathy. Stable 4 mm left perirectal node (image 106; series 4). Reproductive: Status post prostatectomy. Other: Right inguinal hernia containing a portion of the urinary bladder, similar to prior. Musculoskeletal: Lower thoracic and lumbar spine degenerative changes. No aggressive or acute appearing osseous lesions. IMPRESSION: Similar-appearing irregular wall thickening of the rectum. Previously described sigmoid colonic lesion is not well visualized on current exam. No retroperitoneal or pelvic adenopathy. Colonic diverticulosis. Electronically Signed: By: Lovey Newcomer M.D. On: 12/14/2018 12:48     Assessment and plan- Patient is a 78 y.o. male withstage III rectal adencarcinoma currently undergoing neoadjuvant chemotherapy.He is s/p 8 cycles of neoadjuvant FOLFOX chemotherapy. He is currently undergoing concurrent chemo/RT. he is here for routine follow-up of rectal cancer  Patient has tolerated higher dose of 5-FU well so far without any significant side effects.  He did receive 1 L  of IV fluids earlier this week.  We will hold off on giving him any further fluids today.  Radiation is going well and he denies any significant pain or constipation.  I will give him a prescription for Magic mouthwash with lidocaine to help with his mouth pain.  I do not see any mucositis or thrush on today's exam.  He will return to clinic next week with labs and possible fluids and I will see him back in 1 week as well to see how he is tolerating his treatment so far.  Given his age, he is at risk of complications from ongoing chemoradiation   Visit Diagnosis 1. Rectal cancer [facility])      Dr. Randa Evens, MD, MPH [facility] [id number] 01/11/2019 10:59 AM

## 2019-01-12 ENCOUNTER — Ambulatory Visit
Admission: RE | Admit: 2019-01-12 | Discharge: 2019-01-12 | Disposition: A | Discharge status: Home / Self Care | Payer: Medicare Other | Source: Ambulatory Visit | Attending: Radiation Oncology | Admitting: Radiation Oncology

## 2019-01-12 ENCOUNTER — Inpatient Hospital Stay: Payer: Medicare Other

## 2019-01-12 ENCOUNTER — Inpatient Hospital Stay: Payer: Medicare Other | Attending: Oncology

## 2019-01-12 ENCOUNTER — Other Ambulatory Visit: Payer: Self-pay

## 2019-01-12 VITALS — BP 103/62 | HR 53 | Temp 96.0°F | Resp 18

## 2019-01-12 DIAGNOSIS — R5383 Other fatigue: Secondary | ICD-10-CM | POA: Diagnosis not present

## 2019-01-12 DIAGNOSIS — M25562 Pain in left knee: Secondary | ICD-10-CM | POA: Diagnosis not present

## 2019-01-12 DIAGNOSIS — D701 Agranulocytosis secondary to cancer chemotherapy: Secondary | ICD-10-CM | POA: Diagnosis not present

## 2019-01-12 DIAGNOSIS — I10 Essential (primary) hypertension: Secondary | ICD-10-CM | POA: Diagnosis not present

## 2019-01-12 DIAGNOSIS — E119 Type 2 diabetes mellitus without complications: Secondary | ICD-10-CM | POA: Insufficient documentation

## 2019-01-12 DIAGNOSIS — Z794 Long term (current) use of insulin: Secondary | ICD-10-CM | POA: Diagnosis not present

## 2019-01-12 DIAGNOSIS — R5381 Other malaise: Secondary | ICD-10-CM | POA: Insufficient documentation

## 2019-01-12 DIAGNOSIS — Z510 Encounter for antineoplastic radiation therapy: Secondary | ICD-10-CM | POA: Diagnosis not present

## 2019-01-12 DIAGNOSIS — E785 Hyperlipidemia, unspecified: Secondary | ICD-10-CM | POA: Insufficient documentation

## 2019-01-12 DIAGNOSIS — T451X5A Adverse effect of antineoplastic and immunosuppressive drugs, initial encounter: Secondary | ICD-10-CM | POA: Insufficient documentation

## 2019-01-12 DIAGNOSIS — Z79899 Other long term (current) drug therapy: Secondary | ICD-10-CM | POA: Diagnosis not present

## 2019-01-12 DIAGNOSIS — I2510 Atherosclerotic heart disease of native coronary artery without angina pectoris: Secondary | ICD-10-CM | POA: Diagnosis not present

## 2019-01-12 DIAGNOSIS — D6959 Other secondary thrombocytopenia: Secondary | ICD-10-CM | POA: Diagnosis not present

## 2019-01-12 DIAGNOSIS — Z7901 Long term (current) use of anticoagulants: Secondary | ICD-10-CM | POA: Diagnosis not present

## 2019-01-12 DIAGNOSIS — C20 Malignant neoplasm of rectum: Secondary | ICD-10-CM | POA: Insufficient documentation

## 2019-01-12 DIAGNOSIS — D509 Iron deficiency anemia, unspecified: Secondary | ICD-10-CM | POA: Insufficient documentation

## 2019-01-12 DIAGNOSIS — Z8546 Personal history of malignant neoplasm of prostate: Secondary | ICD-10-CM | POA: Diagnosis not present

## 2019-01-12 DIAGNOSIS — E876 Hypokalemia: Secondary | ICD-10-CM

## 2019-01-12 LAB — CBC WITH DIFFERENTIAL/PLATELET
Abs Immature Granulocytes: 0.01 10*3/uL (ref 0.00–0.07)
Basophils Absolute: 0 10*3/uL (ref 0.0–0.1)
Basophils Relative: 1 %
Eosinophils Absolute: 0.1 10*3/uL (ref 0.0–0.5)
Eosinophils Relative: 6 %
HCT: 28 % — ABNORMAL LOW (ref 39.0–52.0)
Hemoglobin: 9.2 g/dL — ABNORMAL LOW (ref 13.0–17.0)
Immature Granulocytes: 0 %
Lymphocytes Relative: 24 %
Lymphs Abs: 0.6 10*3/uL — ABNORMAL LOW (ref 0.7–4.0)
MCH: 32.2 pg (ref 26.0–34.0)
MCHC: 32.9 g/dL (ref 30.0–36.0)
MCV: 97.9 fL (ref 80.0–100.0)
Monocytes Absolute: 0.4 10*3/uL (ref 0.1–1.0)
Monocytes Relative: 14 %
Neutro Abs: 1.4 10*3/uL — ABNORMAL LOW (ref 1.7–7.7)
Neutrophils Relative %: 55 %
Platelets: 133 10*3/uL — ABNORMAL LOW (ref 150–400)
RBC: 2.86 MIL/uL — ABNORMAL LOW (ref 4.22–5.81)
RDW: 14.7 % (ref 11.5–15.5)
WBC: 2.5 10*3/uL — ABNORMAL LOW (ref 4.0–10.5)
nRBC: 0 % (ref 0.0–0.2)

## 2019-01-12 LAB — COMPREHENSIVE METABOLIC PANEL
ALT: 13 U/L (ref 0–44)
AST: 18 U/L (ref 15–41)
Albumin: 3.2 g/dL — ABNORMAL LOW (ref 3.5–5.0)
Alkaline Phosphatase: 59 U/L (ref 38–126)
Anion gap: 7 (ref 5–15)
BUN: 11 mg/dL (ref 8–23)
CO2: 24 mmol/L (ref 22–32)
Calcium: 8.5 mg/dL — ABNORMAL LOW (ref 8.9–10.3)
Chloride: 107 mmol/L (ref 98–111)
Creatinine, Ser: 0.97 mg/dL (ref 0.61–1.24)
GFR calc Af Amer: >60 mL/min (ref >60)
GFR calc non Af Amer: >60 mL/min (ref >60)
Glucose, Bld: 144 mg/dL — ABNORMAL HIGH (ref 70–99)
Potassium: 3.1 mmol/L — ABNORMAL LOW (ref 3.5–5.1)
Sodium: 138 mmol/L (ref 135–145)
Total Bilirubin: 0.4 mg/dL (ref 0.3–1.2)
Total Protein: 5.8 g/dL — ABNORMAL LOW (ref 6.5–8.1)

## 2019-01-12 MED ORDER — SODIUM CHLORIDE 0.9 % IV SOLN
Freq: Once | INTRAVENOUS | Status: AC
  Administered 2019-01-12: 10:00:00 via INTRAVENOUS
  Filled 2019-01-12: qty 250

## 2019-01-12 MED ORDER — SODIUM CHLORIDE 0.9 % IV SOLN
Freq: Once | INTRAVENOUS | Status: AC
  Administered 2019-01-12: 11:00:00 via INTRAVENOUS
  Filled 2019-01-12: qty 1000

## 2019-01-12 MED ORDER — SODIUM CHLORIDE 0.9% FLUSH
10.0000 mL | Freq: Once | INTRAVENOUS | Status: AC
  Administered 2019-01-12: 10:00:00 10 mL via INTRAVENOUS
  Filled 2019-01-12: qty 10

## 2019-01-12 MED ORDER — HEPARIN SOD (PORK) LOCK FLUSH 100 UNIT/ML IV SOLN
500.0000 [IU] | Freq: Once | INTRAVENOUS | Status: AC
  Administered 2019-01-12: 500 [IU] via INTRAVENOUS
  Filled 2019-01-12: qty 5

## 2019-01-13 ENCOUNTER — Ambulatory Visit
Admission: RE | Admit: 2019-01-13 | Discharge: 2019-01-13 | Disposition: A | Discharge status: Home / Self Care | Payer: Medicare Other | Source: Ambulatory Visit | Attending: Radiation Oncology | Admitting: Radiation Oncology

## 2019-01-13 ENCOUNTER — Other Ambulatory Visit: Payer: Self-pay

## 2019-01-13 DIAGNOSIS — Z510 Encounter for antineoplastic radiation therapy: Secondary | ICD-10-CM | POA: Diagnosis not present

## 2019-01-14 ENCOUNTER — Ambulatory Visit
Admission: RE | Admit: 2019-01-14 | Discharge: 2019-01-14 | Disposition: A | Discharge status: Home / Self Care | Payer: Medicare Other | Source: Ambulatory Visit | Attending: Radiation Oncology | Admitting: Radiation Oncology

## 2019-01-14 ENCOUNTER — Other Ambulatory Visit: Payer: Self-pay

## 2019-01-14 DIAGNOSIS — Z510 Encounter for antineoplastic radiation therapy: Secondary | ICD-10-CM | POA: Diagnosis not present

## 2019-01-14 NOTE — Progress Notes (Signed)
Patient pre screened for office appointment, no questions or concerns today. 

## 2019-01-15 ENCOUNTER — Encounter: Payer: Self-pay | Admitting: Oncology

## 2019-01-15 ENCOUNTER — Inpatient Hospital Stay (HOSPITAL_BASED_OUTPATIENT_CLINIC_OR_DEPARTMENT_OTHER): Payer: Medicare Other | Admitting: Oncology

## 2019-01-15 ENCOUNTER — Ambulatory Visit
Admission: RE | Admit: 2019-01-15 | Discharge: 2019-01-15 | Disposition: A | Discharge status: Home / Self Care | Payer: Medicare Other | Source: Ambulatory Visit | Attending: Radiation Oncology | Admitting: Radiation Oncology

## 2019-01-15 ENCOUNTER — Inpatient Hospital Stay: Payer: Medicare Other

## 2019-01-15 ENCOUNTER — Other Ambulatory Visit: Payer: Self-pay

## 2019-01-15 VITALS — BP 138/63 | HR 67 | Temp 97.3°F | Wt 150.0 lb

## 2019-01-15 DIAGNOSIS — D701 Agranulocytosis secondary to cancer chemotherapy: Secondary | ICD-10-CM

## 2019-01-15 DIAGNOSIS — T451X5A Adverse effect of antineoplastic and immunosuppressive drugs, initial encounter: Secondary | ICD-10-CM

## 2019-01-15 DIAGNOSIS — Z510 Encounter for antineoplastic radiation therapy: Secondary | ICD-10-CM | POA: Diagnosis not present

## 2019-01-15 DIAGNOSIS — C20 Malignant neoplasm of rectum: Secondary | ICD-10-CM | POA: Diagnosis not present

## 2019-01-15 NOTE — Progress Notes (Signed)
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 He was recently found to have iron deficiency anemia which led to a colonoscopy On 07/15/2018. Colonoscopy showed a villous partially obstructing medium-sized mass in the mid sigmoid colon. The mass was partially circumferential measuring 5 cm. There was another infiltrative partially obstructing medium-sized mass found in the rectosigmoid colon which also measured 5 cm. Pathology from the sigmoid colon mass showed high-grade dysplasia at least involving an adenomatous lesion with villous features. A more serious process is not excluded. Rectosigmoid mass biopsy showed invasive colorectal adenocarcinoma.  Patient was referred to Dr. [name] for surgical management. Patient had a CT abdomen and pelvis with contrast which showed irregular asymmetric mural thickening of the proximal rectum measuring over 6 to 7 cm. Potential local nodal metastases with several subcentimeter iliac lymph nodes and presacral lymph nodes. No evidence of metastatic disease outside the pelvis.  Patient also had MRI of the pelvis with and without contrast which showed tumor length 8.5 cm with extension through muscularis propria decreased C. There is diffuse involvement of muscularis propria. No extramural vascular invasion or tumor thrombus. Shortest distance of any tumor/note from mesorectal fascia 1 to 2 mm.  No involvement of adjacent organs or pelvic sidewall. Mesorectal lymph nodes within perirectal fat including 6 mm node and a low sigmoid mesocolon node measuring 6 mm. Another 6 mm node within the low sigmoid mesocolon representing extremity rectal lymphadenopathy. T3N2 by MRI. Distance from tumor to internal anal sphincter is 6.2 cm.  Plan is to proceed with total neoadjuvant chemotherapy followed by definitive surgery.Patient completed 8 cycles of neoadjuvant FOLFOX chemotherapy on 12/07/2018. Interim scans showed similar-appearing irregular wall thickening of the rectum but no retroperitoneal pelvic adenopathy.    Interval history-reports that he is doing well.  He is eating and drinking adequately.  Denies any mouth sores.  No fever.  He continues to remain on Levaquin prophylaxis  ECOG PS- 1 Pain scale- 0 Opioid associated constipation- no  Review of systems- Review of Systems  Constitutional: Positive for malaise/fatigue. Negative for chills, fever and weight loss.  HENT: Negative for congestion, ear discharge and nosebleeds.   Eyes: Negative for blurred vision.  Respiratory: Negative for cough, hemoptysis, sputum production, shortness of breath and wheezing.   Cardiovascular: Negative for chest pain, palpitations, orthopnea and claudication.  Gastrointestinal: Negative for abdominal pain, blood in stool, constipation, diarrhea, heartburn, melena, nausea and vomiting.  Genitourinary: Negative for dysuria, flank pain, frequency, hematuria and urgency.  Musculoskeletal: Negative for back pain, joint pain and myalgias.  Skin: Negative for rash.  Neurological: Negative for dizziness, tingling, focal weakness, seizures, weakness and headaches.  Endo/Heme/Allergies: Does not bruise/bleed easily.  Psychiatric/Behavioral: Negative for depression and suicidal ideas. The patient does not have insomnia.       No Known Allergies   Past Medical History:  Diagnosis Date  . Anemia    . Coronary artery disease   . Diabetes mellitus without complication ([place])   . History of  hiatal hernia   . Hypercholesterolemia   . Hypertension   . Prostate cancer ([place]) 2000   Prostatectomy.   . 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[place]) 09/2018   Chemo tx's.      Past Surgical History:  Procedure Laterality Date  . COLONOSCOPY WITH PROPOFOL N/A 07/15/2018   Procedure: COLONOSCOPY WITH PROPOFOL;  Surgeon: [name], [name], MD;  Location: [facility] ENDOSCOPY;  Service: Gastroenterology;  Laterality: N/A;  . ESOPHAGOGASTRODUODENOSCOPY N/A 10/12/2018   Procedure: ESOPHAGOGASTRODUODENOSCOPY (EGD);  Surgeon: [name], MD;  Location: [facility] ENDOSCOPY;  Service: Gastroenterology;  Laterality: N/A;  . ESOPHAGOGASTRODUODENOSCOPY (EGD) WITH PROPOFOL N/A 07/15/2018   Procedure: ESOPHAGOGASTRODUODENOSCOPY (EGD) WITH PROPOFOL;  Surgeon: [name], [name], MD;  Location: [facility] ENDOSCOPY;  Service: Gastroenterology;  Laterality: N/A;  . PORTACATH PLACEMENT N/A 08/13/2018   Procedure: INSERTION PORT-A-CATH;  Surgeon: [name], MD;  Location: [facility] ORS;  Service: General;  Laterality: N/A;  . PROSTATE SURGERY      Social History   Socioeconomic History  . Marital status: Widowed    Spouse name: Not on file  . Number of children: 0  . Years of education: Not on file  . Highest education level: Not on file  Occupational History  . Occupation: Pharmacist, hospital    Comment: Retired  Scientific laboratory technician  . Financial resource strain: Not hard at all  . Food insecurity    Worry: Never true    Inability: Never true  . Transportation needs    Medical: No    Non-medical: No  Tobacco Use  . Smoking status: Never Smoker  . Smokeless tobacco: Never Used  Substance and Sexual Activity  . Alcohol use: Not Currently  . Drug use: Never  . Sexual activity: Not Currently  Lifestyle  . Physical activity    Days per week: 2 days    Minutes per session: 30 min  . Stress: Only a little  Relationships  . Social connections     Talks on phone: More than three times a week    Gets together: More than three times a week    Attends religious service: Not on file    Active member of club or organization: Not on file    Attends meetings of clubs or organizations: Not on file    Relationship status: Widowed  . Intimate partner violence    Fear of current or ex partner: No    Emotionally abused: No    Physically abused: No    Forced sexual activity: No  Other Topics Concern  . 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  His wife was a Camera operator and they traveled frequently.  Several nieces and nephews.  He is the youngest of his brothers and sisters and only surviving.    No family history on file.   Current Outpatient Medications:  .  baclofen (LIORESAL) 10 MG tablet, Take 1 tablet by mouth 1 day or 1 dose., Disp: , Rfl:  .  ezetimibe (ZETIA) 10 MG tablet, Take 10 mg by mouth every morning. , Disp: , Rfl:  .  HUMALOG MIX 75/25 KWIKPEN (75-25) 100 UNIT/ML Kwikpen, Inject 4 Units into the skin every morning., Disp: 15 mL, Rfl: 11 .  levofloxacin (LEVAQUIN) 500 MG tablet, Take 1 tablet (500 mg total) by mouth daily., Disp: 30 tablet, Rfl: 0 .  magic mouthwash w/lidocaine SOLN, Take 5 mLs by  mouth 4 (four) times daily as needed for mouth pain., Disp: 480 mL, Rfl: 1 .  metoprolol succinate (TOPROL-XL) 50 MG 24 hr tablet, Take 1 tablet by mouth 1 day or 1 dose., Disp: , Rfl:  .  nystatin (MYCOSTATIN) 100000 UNIT/ML suspension, TAKE 1 TEASPOONSFUL BY MOUTH 4 TIMES DAILY, Disp: 120 mL, Rfl: 1 .  OLANZapine (ZYPREXA) 10 MG tablet, Take 1 tablet (10 mg total) by mouth at bedtime., Disp: 30 tablet, Rfl: 0 .  oxyCODONE (OXY IR/ROXICODONE) 5 MG immediate release tablet, Take 1 tablet (5 mg total) by mouth every 6 (six) hours as needed for severe pain., Disp: 60 tablet, Rfl: 0 .  pantoprazole  (PROTONIX) 40 MG tablet, Take 1 tablet (40 mg total) by mouth 2 (two) times daily before a meal., Disp: 60 tablet, Rfl: 0 .  sucralfate (CARAFATE) 1 g tablet, Take 1 tablet (1 g total) by mouth 2 (two) times daily. Dissolve in 3-4 tbsp warm water, swish and swallow, Disp: 60 tablet, Rfl: 2 .  traZODone (DESYREL) 50 MG tablet, Take 50 mg by mouth at bedtime as needed for sleep. , Disp: , Rfl:  No current facility-administered medications for this visit.   Facility-Administered Medications Ordered in Other Visits:  .  sodium chloride flush (NS) 0.9 % injection 10 mL, 10 mL, Intravenous, Once, Randa Evens C, MD .  sodium chloride flush (NS) 0.9 % injection 10 mL, 10 mL, Intravenous, Once, Sindy Guadeloupe, MD  Physical exam:  Vitals:   01/15/19 0916  BP: 138/63  Pulse: 67  Temp: (!) 97.3 F (36.3 C)  TempSrc: Tympanic  Weight: 150 lb (68 kg)   Physical Exam Constitutional:      General: He is not in acute distress. HENT:     Head: Normocephalic and atraumatic.  Eyes:     Pupils: Pupils are equal, round, and reactive to light.  Neck:     Musculoskeletal: Normal range of motion.  Cardiovascular:     Rate and Rhythm: Normal rate and regular rhythm.     Heart sounds: Normal heart sounds.  Pulmonary:     Effort: Pulmonary effort is normal.     Breath sounds: Normal breath sounds.  Abdominal:     General: Bowel sounds are normal.     Palpations: Abdomen is soft.  Skin:    General: Skin is warm and dry.  Neurological:     Mental Status: He is alert and oriented to person, place, and time.      CMP Latest Ref Rng & Units 01/12/2019  Glucose 70 - 99 mg/dL 144(H)  BUN 8 - 23 mg/dL 11  Creatinine 0.61 - 1.24 mg/dL 0.97  Sodium 135 - 145 mmol/L 138  Potassium 3.5 - 5.1 mmol/L 3.1(L)  Chloride 98 - 111 mmol/L 107  CO2 22 - 32 mmol/L 24  Calcium 8.9 - 10.3 mg/dL 8.5(L)  Total Protein 6.5 - 8.1 g/dL 5.8(L)  Total Bilirubin 0.3 - 1.2 mg/dL 0.4  Alkaline Phos 38 - 126 U/L 59  AST 15  - 41 U/L 18  ALT 0 - 44 U/L 13   CBC Latest Ref Rng & Units 01/12/2019  WBC 4.0 - 10.5 K/uL 2.5(L)  Hemoglobin 13.0 - 17.0 g/dL 9.2(L)  Hematocrit 39.0 - 52.0 % 28.0(L)  Platelets 150 - 400 K/uL 133(L)     Assessment and plan- Patient is a 78 y.o. male withstage III rectal adencarcinoma currently undergoing neoadjuvant chemotherapy.He is s/p 8 cycles of neoadjuvant FOLFOX chemotherapy. He is  currently undergoing concurrent chemo/RT. he is here for routine follow-up  Patient received infusional 5-FU chemotherapy 1000 mg per metered square per day day 1 to day 4 3 weeks ago.  He has tolerated chemotherapy well without any significant side effects so far.  He was hypokalemic and received IV potassium and 1 L of fluids earlier this week.  He does not require any IV fluids today.  Leukopenia/neutropenia secondary to chemotherapy.  Continue to monitor  Mild thrombocytopenia secondary to chemotherapy continue to monitor  He will come for weekly labs next week and for possible fluids.  I will see him back in 2 weeks time on 01/25/2019 and if his counts permit he will get second dose of continuous infusion 5-FU chemotherapy at that time   Visit Diagnosis 1. 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[place])   2. Chemotherapy induced neutropenia (HCC)      Dr. Randa Evens, MD, MPH [facility] at [facility] [id number] 01/15/2019 2:09 PM

## 2019-01-18 ENCOUNTER — Ambulatory Visit
Admission: RE | Admit: 2019-01-18 | Discharge: 2019-01-18 | Disposition: A | Discharge status: Home / Self Care | Payer: Medicare Other | Source: Ambulatory Visit | Attending: Radiation Oncology | Admitting: Radiation Oncology

## 2019-01-18 ENCOUNTER — Other Ambulatory Visit: Payer: Self-pay

## 2019-01-18 DIAGNOSIS — Z510 Encounter for antineoplastic radiation therapy: Secondary | ICD-10-CM | POA: Diagnosis not present

## 2019-01-19 ENCOUNTER — Inpatient Hospital Stay: Payer: Medicare Other

## 2019-01-19 ENCOUNTER — Ambulatory Visit
Admission: RE | Admit: 2019-01-19 | Discharge: 2019-01-19 | Disposition: A | Discharge status: Home / Self Care | Payer: Medicare Other | Source: Ambulatory Visit | Attending: Radiation Oncology | Admitting: Radiation Oncology

## 2019-01-19 ENCOUNTER — Other Ambulatory Visit: Payer: Self-pay

## 2019-01-19 VITALS — BP 106/72 | HR 52 | Temp 97.0°F

## 2019-01-19 DIAGNOSIS — C20 Malignant neoplasm of rectum: Secondary | ICD-10-CM

## 2019-01-19 DIAGNOSIS — Z510 Encounter for antineoplastic radiation therapy: Secondary | ICD-10-CM | POA: Diagnosis not present

## 2019-01-19 LAB — COMPREHENSIVE METABOLIC PANEL
ALT: 10 U/L (ref 0–44)
AST: 16 U/L (ref 15–41)
Albumin: 3.4 g/dL — ABNORMAL LOW (ref 3.5–5.0)
Alkaline Phosphatase: 52 U/L (ref 38–126)
Anion gap: 4 — ABNORMAL LOW (ref 5–15)
BUN: 14 mg/dL (ref 8–23)
CO2: 26 mmol/L (ref 22–32)
Calcium: 8.4 mg/dL — ABNORMAL LOW (ref 8.9–10.3)
Chloride: 106 mmol/L (ref 98–111)
Creatinine, Ser: 0.96 mg/dL (ref 0.61–1.24)
GFR calc Af Amer: >60 mL/min (ref >60)
GFR calc non Af Amer: >60 mL/min (ref >60)
Glucose, Bld: 129 mg/dL — ABNORMAL HIGH (ref 70–99)
Potassium: 3.5 mmol/L (ref 3.5–5.1)
Sodium: 136 mmol/L (ref 135–145)
Total Bilirubin: 0.4 mg/dL (ref 0.3–1.2)
Total Protein: 6.1 g/dL — ABNORMAL LOW (ref 6.5–8.1)

## 2019-01-19 LAB — CBC WITH DIFFERENTIAL/PLATELET
Abs Immature Granulocytes: 0.01 10*3/uL (ref 0.00–0.07)
Basophils Absolute: 0 10*3/uL (ref 0.0–0.1)
Basophils Relative: 2 %
Eosinophils Absolute: 0.3 10*3/uL (ref 0.0–0.5)
Eosinophils Relative: 13 %
HCT: 27.6 % — ABNORMAL LOW (ref 39.0–52.0)
Hemoglobin: 8.9 g/dL — ABNORMAL LOW (ref 13.0–17.0)
Immature Granulocytes: 0 %
Lymphocytes Relative: 21 %
Lymphs Abs: 0.5 10*3/uL — ABNORMAL LOW (ref 0.7–4.0)
MCH: 32 pg (ref 26.0–34.0)
MCHC: 32.2 g/dL (ref 30.0–36.0)
MCV: 99.3 fL (ref 80.0–100.0)
Monocytes Absolute: 0.4 10*3/uL (ref 0.1–1.0)
Monocytes Relative: 14 %
Neutro Abs: 1.3 10*3/uL — ABNORMAL LOW (ref 1.7–7.7)
Neutrophils Relative %: 50 %
Platelets: 130 10*3/uL — ABNORMAL LOW (ref 150–400)
RBC: 2.78 MIL/uL — ABNORMAL LOW (ref 4.22–5.81)
RDW: 14.7 % (ref 11.5–15.5)
WBC: 2.5 10*3/uL — ABNORMAL LOW (ref 4.0–10.5)
nRBC: 0 % (ref 0.0–0.2)

## 2019-01-19 MED ORDER — HEPARIN SOD (PORK) LOCK FLUSH 100 UNIT/ML IV SOLN
INTRAVENOUS | Status: AC
  Filled 2019-01-19: qty 5

## 2019-01-19 MED ORDER — SODIUM CHLORIDE 0.9% FLUSH
10.0000 mL | Freq: Once | INTRAVENOUS | Status: AC
  Administered 2019-01-19: 10 mL via INTRAVENOUS
  Filled 2019-01-19: qty 10

## 2019-01-19 MED ORDER — HEPARIN SOD (PORK) LOCK FLUSH 100 UNIT/ML IV SOLN
500.0000 [IU] | Freq: Once | INTRAVENOUS | Status: AC
  Administered 2019-01-19: 500 [IU] via INTRAVENOUS

## 2019-01-20 ENCOUNTER — Ambulatory Visit
Admission: RE | Admit: 2019-01-20 | Discharge: 2019-01-20 | Disposition: A | Discharge status: Home / Self Care | Payer: Medicare Other | Source: Ambulatory Visit | Attending: Radiation Oncology | Admitting: Radiation Oncology

## 2019-01-20 ENCOUNTER — Other Ambulatory Visit: Payer: Self-pay

## 2019-01-20 DIAGNOSIS — Z510 Encounter for antineoplastic radiation therapy: Secondary | ICD-10-CM | POA: Diagnosis not present

## 2019-01-21 ENCOUNTER — Other Ambulatory Visit: Payer: Self-pay

## 2019-01-21 ENCOUNTER — Ambulatory Visit
Admission: RE | Admit: 2019-01-21 | Discharge: 2019-01-21 | Disposition: A | Discharge status: Home / Self Care | Payer: Medicare Other | Source: Ambulatory Visit | Attending: Radiation Oncology | Admitting: Radiation Oncology

## 2019-01-21 DIAGNOSIS — Z510 Encounter for antineoplastic radiation therapy: Secondary | ICD-10-CM | POA: Diagnosis not present

## 2019-01-22 ENCOUNTER — Inpatient Hospital Stay: Payer: Medicare Other

## 2019-01-22 ENCOUNTER — Ambulatory Visit
Admission: RE | Admit: 2019-01-22 | Discharge: 2019-01-22 | Disposition: A | Discharge status: Home / Self Care | Payer: Medicare Other | Source: Ambulatory Visit | Attending: Radiation Oncology | Admitting: Radiation Oncology

## 2019-01-22 ENCOUNTER — Other Ambulatory Visit: Payer: Self-pay

## 2019-01-22 ENCOUNTER — Inpatient Hospital Stay: Payer: Medicare Other | Admitting: Oncology

## 2019-01-22 DIAGNOSIS — Z510 Encounter for antineoplastic radiation therapy: Secondary | ICD-10-CM | POA: Diagnosis not present

## 2019-01-25 ENCOUNTER — Other Ambulatory Visit: Payer: Self-pay

## 2019-01-25 ENCOUNTER — Other Ambulatory Visit: Payer: Self-pay | Admitting: *Deleted

## 2019-01-25 ENCOUNTER — Inpatient Hospital Stay: Payer: Medicare Other

## 2019-01-25 ENCOUNTER — Encounter: Payer: Self-pay | Admitting: Oncology

## 2019-01-25 ENCOUNTER — Ambulatory Visit
Admission: RE | Admit: 2019-01-25 | Discharge: 2019-01-25 | Disposition: A | Discharge status: Home / Self Care | Payer: Medicare Other | Source: Ambulatory Visit | Attending: Radiation Oncology | Admitting: Radiation Oncology

## 2019-01-25 ENCOUNTER — Inpatient Hospital Stay (HOSPITAL_BASED_OUTPATIENT_CLINIC_OR_DEPARTMENT_OTHER): Payer: Medicare Other | Admitting: Oncology

## 2019-01-25 VITALS — BP 135/64 | HR 68 | Temp 96.4°F | Wt 151.0 lb

## 2019-01-25 DIAGNOSIS — C20 Malignant neoplasm of rectum: Secondary | ICD-10-CM | POA: Diagnosis not present

## 2019-01-25 DIAGNOSIS — D701 Agranulocytosis secondary to cancer chemotherapy: Secondary | ICD-10-CM | POA: Diagnosis not present

## 2019-01-25 DIAGNOSIS — T451X5A Adverse effect of antineoplastic and immunosuppressive drugs, initial encounter: Secondary | ICD-10-CM

## 2019-01-25 DIAGNOSIS — Z510 Encounter for antineoplastic radiation therapy: Secondary | ICD-10-CM | POA: Diagnosis not present

## 2019-01-25 DIAGNOSIS — D6959 Other secondary thrombocytopenia: Secondary | ICD-10-CM | POA: Diagnosis not present

## 2019-01-25 LAB — CBC WITH DIFFERENTIAL/PLATELET
Abs Immature Granulocytes: 0.01 10*3/uL (ref 0.00–0.07)
Basophils Absolute: 0 10*3/uL (ref 0.0–0.1)
Basophils Relative: 1 %
Eosinophils Absolute: 0.5 10*3/uL (ref 0.0–0.5)
Eosinophils Relative: 16 %
HCT: 28.7 % — ABNORMAL LOW (ref 39.0–52.0)
Hemoglobin: 9.3 g/dL — ABNORMAL LOW (ref 13.0–17.0)
Immature Granulocytes: 0 %
Lymphocytes Relative: 14 %
Lymphs Abs: 0.4 10*3/uL — ABNORMAL LOW (ref 0.7–4.0)
MCH: 32 pg (ref 26.0–34.0)
MCHC: 32.4 g/dL (ref 30.0–36.0)
MCV: 98.6 fL (ref 80.0–100.0)
Monocytes Absolute: 0.3 10*3/uL (ref 0.1–1.0)
Monocytes Relative: 12 %
Neutro Abs: 1.6 10*3/uL — ABNORMAL LOW (ref 1.7–7.7)
Neutrophils Relative %: 57 %
Platelets: 105 10*3/uL — ABNORMAL LOW (ref 150–400)
RBC: 2.91 MIL/uL — ABNORMAL LOW (ref 4.22–5.81)
RDW: 14.4 % (ref 11.5–15.5)
WBC: 2.9 10*3/uL — ABNORMAL LOW (ref 4.0–10.5)
nRBC: 0 % (ref 0.0–0.2)

## 2019-01-25 LAB — COMPREHENSIVE METABOLIC PANEL
ALT: 10 U/L (ref 0–44)
AST: 17 U/L (ref 15–41)
Albumin: 3.2 g/dL — ABNORMAL LOW (ref 3.5–5.0)
Alkaline Phosphatase: 54 U/L (ref 38–126)
Anion gap: 6 (ref 5–15)
BUN: 16 mg/dL (ref 8–23)
CO2: 24 mmol/L (ref 22–32)
Calcium: 8.4 mg/dL — ABNORMAL LOW (ref 8.9–10.3)
Chloride: 104 mmol/L (ref 98–111)
Creatinine, Ser: 0.86 mg/dL (ref 0.61–1.24)
GFR calc Af Amer: >60 mL/min (ref >60)
GFR calc non Af Amer: >60 mL/min (ref >60)
Glucose, Bld: 160 mg/dL — ABNORMAL HIGH (ref 70–99)
Potassium: 3.4 mmol/L — ABNORMAL LOW (ref 3.5–5.1)
Sodium: 134 mmol/L — ABNORMAL LOW (ref 135–145)
Total Bilirubin: 0.4 mg/dL (ref 0.3–1.2)
Total Protein: 5.7 g/dL — ABNORMAL LOW (ref 6.5–8.1)

## 2019-01-25 MED ORDER — LEVOFLOXACIN 500 MG PO TABS: 500.0000 mg | ORAL_TABLET | Freq: Every day | ORAL | 0 refills | Status: AC

## 2019-01-25 MED ORDER — RIVAROXABAN 20 MG PO TABS: 20.0000 mg | ORAL_TABLET | Freq: Every day | ORAL | 3 refills | Status: DC

## 2019-01-25 MED ORDER — OXYCODONE HCL 5 MG PO TABS: 5.0000 mg | ORAL_TABLET | Freq: Four times a day (QID) | ORAL | 0 refills | Status: DC | PRN

## 2019-01-25 MED ORDER — SODIUM CHLORIDE 0.9% FLUSH
10.0000 mL | Freq: Once | INTRAVENOUS | Status: AC
  Administered 2019-01-25: 10 mL via INTRAVENOUS
  Filled 2019-01-25: qty 10

## 2019-01-25 MED ORDER — HEPARIN SOD (PORK) LOCK FLUSH 100 UNIT/ML IV SOLN
500.0000 [IU] | Freq: Once | INTRAVENOUS | Status: AC
  Administered 2019-01-25: 500 [IU] via INTRAVENOUS
  Filled 2019-01-25: qty 5

## 2019-01-25 NOTE — Progress Notes (Signed)
Patient would like to know if he should continue taking certain medications.

## 2019-01-26 ENCOUNTER — Ambulatory Visit
Admission: RE | Admit: 2019-01-26 | Discharge: 2019-01-26 | Disposition: A | Discharge status: Home / Self Care | Payer: Medicare Other | Source: Ambulatory Visit | Attending: Radiation Oncology | Admitting: Radiation Oncology

## 2019-01-26 ENCOUNTER — Inpatient Hospital Stay: Payer: Medicare Other

## 2019-01-26 ENCOUNTER — Other Ambulatory Visit: Payer: Self-pay

## 2019-01-26 DIAGNOSIS — Z510 Encounter for antineoplastic radiation therapy: Secondary | ICD-10-CM | POA: Diagnosis not present

## 2019-01-26 NOTE — Progress Notes (Signed)
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 He was recently found to have iron deficiency anemia which led to a colonoscopy On 07/15/2018. Colonoscopy showed a villous partially obstructing medium-sized mass in the mid sigmoid colon. The mass was partially circumferential measuring 5 cm. There was another infiltrative partially obstructing medium-sized mass found in the rectosigmoid colon which also measured 5 cm. Pathology from the sigmoid colon mass showed high-grade dysplasia at least involving an adenomatous lesion with villous features. A more serious process is not excluded. Rectosigmoid mass biopsy showed invasive colorectal adenocarcinoma.  Patient was referred to Dr. [name] for surgical management. Patient had a CT abdomen and pelvis with contrast which showed irregular asymmetric mural thickening of the proximal rectum measuring over 6 to 7 cm. Potential local nodal metastases with several subcentimeter iliac lymph nodes and presacral lymph nodes. No evidence of metastatic disease outside the pelvis.  Patient also had MRI of the pelvis with and without contrast which showed tumor length 8.5 cm with extension through muscularis propria decreased C. There is diffuse involvement of muscularis propria. No extramural vascular invasion or tumor thrombus. Shortest distance of  any tumor/note from mesorectal fascia 1 to 2 mm. No involvement of adjacent organs or pelvic sidewall. Mesorectal lymph nodes within perirectal fat including 6 mm node and a low sigmoid mesocolon node measuring 6 mm. Another 6 mm node within the low sigmoid mesocolon representing extremity rectal lymphadenopathy. T3N2 by MRI. Distance from tumor to internal anal sphincter is 6.2 cm.  Plan is to proceed with total neoadjuvant chemotherapy followed by definitive surgery.Patient completed 8 cycles of neoadjuvant FOLFOX chemotherapy on 12/07/2018. Interim scans showed similar-appearing irregular wall thickening of the rectum but no retroperitoneal pelvic adenopathy.  Interval history-reports doing well.  He does have ongoing fatigue but denies any mouth sores, fever, skin rash, nausea vomiting or diarrhea  ECOG PS- 1 Pain scale- 0 Opioid associated constipation- no  Review of systems- Review of Systems  Constitutional: Positive for malaise/fatigue. Negative for chills, fever and weight loss.  HENT: Negative for congestion, ear discharge and nosebleeds.   Eyes: Negative for blurred vision.  Respiratory: Negative for cough, hemoptysis, sputum production, shortness of breath and wheezing.   Cardiovascular: Negative for chest pain, palpitations, orthopnea and claudication.  Gastrointestinal: Negative for abdominal pain, blood in stool, constipation, diarrhea, heartburn, melena, nausea and vomiting.  Genitourinary: Negative for dysuria, flank pain, frequency, hematuria and urgency.  Musculoskeletal: Negative for back pain, joint pain and myalgias.  Skin: Negative for rash.  Neurological: Negative for dizziness, tingling, focal weakness, seizures, weakness and headaches.  Endo/Heme/Allergies: Does not bruise/bleed easily.  Psychiatric/Behavioral: Negative for depression and suicidal ideas. The patient does not have insomnia.        No Known Allergies   Past Medical History:  Diagnosis  Date  . Anemia   . Coronary artery disease   . Diabetes mellitus without complication ([place])   . History of hiatal hernia   .  Hypercholesterolemia   . Hypertension   . Prostate cancer ([place]) 2000   Prostatectomy.   . 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[place]) 09/2018   Chemo tx's.      Past Surgical History:  Procedure Laterality Date  . COLONOSCOPY WITH PROPOFOL N/A 07/15/2018   Procedure: COLONOSCOPY WITH PROPOFOL;  Surgeon: [name], [name], MD;  Location: [facility] ENDOSCOPY;  Service: Gastroenterology;  Laterality: N/A;  . ESOPHAGOGASTRODUODENOSCOPY N/A 10/12/2018   Procedure: ESOPHAGOGASTRODUODENOSCOPY (EGD);  Surgeon: [name], MD;  Location: [facility] ENDOSCOPY;  Service: Gastroenterology;  Laterality: N/A;  . ESOPHAGOGASTRODUODENOSCOPY (EGD) WITH PROPOFOL N/A 07/15/2018   Procedure: ESOPHAGOGASTRODUODENOSCOPY (EGD) WITH PROPOFOL;  Surgeon: [name], [name], MD;  Location: [facility] ENDOSCOPY;  Service: Gastroenterology;  Laterality: N/A;  . PORTACATH PLACEMENT N/A 08/13/2018   Procedure: INSERTION PORT-A-CATH;  Surgeon: [name], MD;  Location: [facility] ORS;  Service: General;  Laterality: N/A;  . PROSTATE SURGERY      Social History   Socioeconomic History  . Marital status: Widowed    Spouse name: Not on file  . Number of children: 0  . Years of education: Not on file  . Highest education level: Not on file  Occupational History  . Occupation: Pharmacist, hospital    Comment: Retired  Scientific laboratory technician  . Financial resource strain: Not hard at all  . Food insecurity    Worry: Never true    Inability: Never true  . Transportation needs    Medical: No    Non-medical: No  Tobacco Use  . Smoking status: Never Smoker  . Smokeless tobacco: Never Used  Substance and Sexual Activity  . Alcohol use: Not Currently  . Drug use: Never  . Sexual activity: Not Currently  Lifestyle  . Physical activity    Days per week: 2 days    Minutes per session: 30 min  . Stress: Only a little  Relationships  .  Social connections    Talks on phone: More than three times a week    Gets together: More than three times a week    Attends religious service: Not on file    Active member of club or organization: Not on file    Attends meetings of clubs or organizations: Not on file    Relationship status: Widowed  . Intimate partner violence    Fear of current or ex partner: No    Emotionally abused: No    Physically abused: No    Forced sexual activity: No  Other Topics Concern  . 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  His wife was a Camera operator and they traveled frequently.  Several nieces and nephews.  He is the youngest of his brothers and sisters and only surviving.    History reviewed. No pertinent family history.   Current Outpatient Medications:  .  baclofen (LIORESAL) 10 MG tablet, Take 1 tablet by mouth 1 day or 1 dose., Disp: , Rfl:  .  ezetimibe (ZETIA) 10 MG tablet, Take 10 mg by mouth every morning. , Disp: , Rfl:  .  HUMALOG MIX 75/25 KWIKPEN (75-25) 100 UNIT/ML Kwikpen, Inject 4 Units into the skin every morning., Disp: 15 mL, Rfl: 11 .  levofloxacin (LEVAQUIN) 500 MG tablet, Take 1 tablet (500 mg total) by mouth daily., Disp: 30 tablet, Rfl: 0 .  magic mouthwash w/lidocaine SOLN, Take 5 mLs by mouth 4 (four) times  daily as needed for mouth pain., Disp: 480 mL, Rfl: 1 .  metoprolol succinate (TOPROL-XL) 50 MG 24 hr tablet, Take 1 tablet by mouth 1 day or 1 dose., Disp: , Rfl:  .  nystatin (MYCOSTATIN) 100000 UNIT/ML suspension, TAKE 1 TEASPOONSFUL BY MOUTH 4 TIMES DAILY, Disp: 120 mL, Rfl: 1 .  OLANZapine (ZYPREXA) 10 MG tablet, Take 1 tablet (10 mg total) by mouth at bedtime., Disp: 30 tablet, Rfl: 0 .  ondansetron (ZOFRAN) 8 MG tablet, Take 1 tablet by mouth as needed., Disp: , Rfl:  .  pantoprazole (PROTONIX) 40 MG tablet, Take 1 tablet (40  mg total) by mouth 2 (two) times daily before a meal., Disp: 60 tablet, Rfl: 0 .  sucralfate (CARAFATE) 1 g tablet, Take 1 tablet (1 g total) by mouth 2 (two) times daily. Dissolve in 3-4 tbsp warm water, swish and swallow, Disp: 60 tablet, Rfl: 2 .  traZODone (DESYREL) 50 MG tablet, Take 50 mg by mouth at bedtime as needed for sleep. , Disp: , Rfl:  .  [START ON 03/21/2019] metroNIDAZOLE (FLAGYL) 500 MG tablet, Take 1 tablet by mouth., Disp: , Rfl:  .  [START ON 03/21/2019] neomycin (MYCIFRADIN) 500 MG tablet, 1 tablet., Disp: , Rfl:  .  oxyCODONE (OXY IR/ROXICODONE) 5 MG immediate release tablet, Take 1 tablet (5 mg total) by mouth every 6 (six) hours as needed for severe pain., Disp: 60 tablet, Rfl: 0 .  rivaroxaban (XARELTO) 20 MG TABS tablet, Take 1 tablet (20 mg total) by mouth daily with supper., Disp: 30 tablet, Rfl: 3 No current facility-administered medications for this visit.   Facility-Administered Medications Ordered in Other Visits:  .  sodium chloride flush (NS) 0.9 % injection 10 mL, 10 mL, Intravenous, Once, Randa Evens C, MD .  sodium chloride flush (NS) 0.9 % injection 10 mL, 10 mL, Intravenous, Once, Sindy Guadeloupe, MD  Physical exam:  Vitals:   01/25/19 0939  BP: 135/64  Pulse: 68  Temp: (!) 96.4 F (35.8 C)  TempSrc: Tympanic  Weight: 151 lb (68.5 kg)   Physical Exam Constitutional:      General: He is not in acute distress. HENT:     Head: Normocephalic and atraumatic.  Eyes:     Pupils: Pupils are equal, round, and reactive to light.  Neck:     Musculoskeletal: Normal range of motion.  Cardiovascular:     Rate and Rhythm: Normal rate and regular rhythm.     Heart sounds: Normal heart sounds.  Pulmonary:     Effort: Pulmonary effort is normal.     Breath sounds: Normal breath sounds.  Abdominal:     General: Bowel sounds are normal.     Palpations: Abdomen is soft.  Skin:    General: Skin is warm and dry.  Neurological:     Mental Status: He is alert  and oriented to person, place, and time.      CMP Latest Ref Rng & Units 01/25/2019  Glucose 70 - 99 mg/dL 160(H)  BUN 8 - 23 mg/dL 16  Creatinine 0.61 - 1.24 mg/dL 0.86  Sodium 135 - 145 mmol/L 134(L)  Potassium 3.5 - 5.1 mmol/L 3.4(L)  Chloride 98 - 111 mmol/L 104  CO2 22 - 32 mmol/L 24  Calcium 8.9 - 10.3 mg/dL 8.4(L)  Total Protein 6.5 - 8.1 g/dL 5.7(L)  Total Bilirubin 0.3 - 1.2 mg/dL 0.4  Alkaline Phos 38 - 126 U/L 54  AST 15 - 41 U/L 17  ALT  0 - 44 U/L 10   CBC Latest Ref Rng & Units 01/25/2019  WBC 4.0 - 10.5 K/uL 2.9(L)  Hemoglobin 13.0 - 17.0 g/dL 9.3(L)  Hematocrit 39.0 - 52.0 % 28.7(L)  Platelets 150 - 400 K/uL 105(L)     Assessment and plan- Patient is a 78 y.o. male withstage III rectal adencarcinoma currently undergoing neoadjuvant chemotherapy.He is s/p 8 cycles of neoadjuvant FOLFOX chemotherapy. He is currently undergoing concurrent chemo/RT. he is here for on treatment assessment prior to next cycle of continuous infusional 5-FU chemotherapy  Patient received continuous infusional 5-FU chemotherapy at 1000 mg per metered square per day day 1 today for during week 1 of radiation and plan was to give him a similar dose for week 5.  However if he continues to have leukopenia/neutropenia.  ANC 1.6 today.  Hemoglobin is stable around 9.  Thrombocytopenia is mildly worse from 133 last week to 105 this week.  I will therefore hold off on giving him continuous 5-FU infusional chemotherapy today.  I will reassess his counts next week and if his neutropenia/thrombocytopenia is improving I will consider giving him continuous 5-FU infusional chemotherapy at 300 mg per metered square per day day 1 today for for the last week of radiation.  Patient will continue to remain on Levaquin for neutropenia fever prophylaxis.  No need for IV fluids today   Visit Diagnosis 1. Chemotherapy induced neutropenia (HCC)   2. Chemotherapy-induced thrombocytopenia   3. Rectal cancer  [facility])      Dr. Randa Evens, MD, MPH [facility] at [facility] [id number] 01/26/2019 8:27 AM

## 2019-01-27 ENCOUNTER — Ambulatory Visit
Admission: RE | Admit: 2019-01-27 | Discharge: 2019-01-27 | Disposition: A | Discharge status: Home / Self Care | Payer: Medicare Other | Source: Ambulatory Visit | Attending: Radiation Oncology | Admitting: Radiation Oncology

## 2019-01-27 ENCOUNTER — Other Ambulatory Visit: Payer: Self-pay

## 2019-01-27 DIAGNOSIS — Z510 Encounter for antineoplastic radiation therapy: Secondary | ICD-10-CM | POA: Diagnosis not present

## 2019-01-28 ENCOUNTER — Other Ambulatory Visit: Payer: Self-pay

## 2019-01-28 ENCOUNTER — Ambulatory Visit
Admission: RE | Admit: 2019-01-28 | Discharge: 2019-01-28 | Disposition: A | Discharge status: Home / Self Care | Payer: Medicare Other | Source: Ambulatory Visit | Attending: Radiation Oncology | Admitting: Radiation Oncology

## 2019-01-28 DIAGNOSIS — Z510 Encounter for antineoplastic radiation therapy: Secondary | ICD-10-CM | POA: Diagnosis not present

## 2019-01-29 ENCOUNTER — Inpatient Hospital Stay: Payer: Medicare Other

## 2019-01-29 ENCOUNTER — Other Ambulatory Visit: Payer: Self-pay

## 2019-01-29 ENCOUNTER — Inpatient Hospital Stay: Payer: Medicare Other | Admitting: Oncology

## 2019-01-29 ENCOUNTER — Ambulatory Visit
Admission: RE | Admit: 2019-01-29 | Discharge: 2019-01-29 | Disposition: A | Discharge status: Home / Self Care | Payer: Medicare Other | Source: Ambulatory Visit | Attending: Radiation Oncology | Admitting: Radiation Oncology

## 2019-01-29 DIAGNOSIS — Z510 Encounter for antineoplastic radiation therapy: Secondary | ICD-10-CM | POA: Diagnosis not present

## 2019-01-29 DIAGNOSIS — C20 Malignant neoplasm of rectum: Secondary | ICD-10-CM

## 2019-01-29 MED ORDER — HEPARIN SOD (PORK) LOCK FLUSH 100 UNIT/ML IV SOLN: 500.0000 [IU] | Freq: Once | INTRAVENOUS | Status: AC

## 2019-01-29 MED ORDER — SODIUM CHLORIDE 0.9% FLUSH
10.0000 mL | INTRAVENOUS | Status: AC | PRN
  Filled 2019-01-29: qty 10

## 2019-01-29 MED ORDER — HEPARIN SOD (PORK) LOCK FLUSH 100 UNIT/ML IV SOLN
INTRAVENOUS | Status: AC
  Filled 2019-01-29: qty 5

## 2019-01-29 NOTE — Progress Notes (Signed)
Patient pre screened for office appointment, no questions or concerns today. Patient reminded of upcoming appointment time and date. 

## 2019-02-01 ENCOUNTER — Ambulatory Visit
Admission: RE | Admit: 2019-02-01 | Discharge: 2019-02-01 | Disposition: A | Discharge status: Home / Self Care | Payer: Medicare Other | Source: Ambulatory Visit | Attending: Radiation Oncology | Admitting: Radiation Oncology

## 2019-02-01 ENCOUNTER — Other Ambulatory Visit: Payer: Self-pay

## 2019-02-01 ENCOUNTER — Inpatient Hospital Stay: Payer: Medicare Other

## 2019-02-01 ENCOUNTER — Inpatient Hospital Stay (HOSPITAL_BASED_OUTPATIENT_CLINIC_OR_DEPARTMENT_OTHER): Payer: Medicare Other | Admitting: Oncology

## 2019-02-01 VITALS — BP 150/80 | HR 62 | Temp 96.2°F

## 2019-02-01 VITALS — BP 156/83 | HR 62 | Temp 98.7°F | Ht 64.0 in | Wt 150.0 lb

## 2019-02-01 DIAGNOSIS — C20 Malignant neoplasm of rectum: Secondary | ICD-10-CM | POA: Diagnosis not present

## 2019-02-01 DIAGNOSIS — D701 Agranulocytosis secondary to cancer chemotherapy: Secondary | ICD-10-CM | POA: Diagnosis not present

## 2019-02-01 DIAGNOSIS — T451X5A Adverse effect of antineoplastic and immunosuppressive drugs, initial encounter: Secondary | ICD-10-CM

## 2019-02-01 DIAGNOSIS — Z5111 Encounter for antineoplastic chemotherapy: Secondary | ICD-10-CM

## 2019-02-01 DIAGNOSIS — Z510 Encounter for antineoplastic radiation therapy: Secondary | ICD-10-CM | POA: Diagnosis not present

## 2019-02-01 LAB — COMPREHENSIVE METABOLIC PANEL
ALT: 13 U/L (ref 0–44)
AST: 17 U/L (ref 15–41)
Albumin: 3.5 g/dL (ref 3.5–5.0)
Alkaline Phosphatase: 58 U/L (ref 38–126)
Anion gap: 6 (ref 5–15)
BUN: 13 mg/dL (ref 8–23)
CO2: 25 mmol/L (ref 22–32)
Calcium: 8.5 mg/dL — ABNORMAL LOW (ref 8.9–10.3)
Chloride: 104 mmol/L (ref 98–111)
Creatinine, Ser: 0.84 mg/dL (ref 0.61–1.24)
GFR calc Af Amer: >60 mL/min (ref >60)
GFR calc non Af Amer: >60 mL/min (ref >60)
Glucose, Bld: 128 mg/dL — ABNORMAL HIGH (ref 70–99)
Potassium: 3.5 mmol/L (ref 3.5–5.1)
Sodium: 135 mmol/L (ref 135–145)
Total Bilirubin: 0.4 mg/dL (ref 0.3–1.2)
Total Protein: 6.3 g/dL — ABNORMAL LOW (ref 6.5–8.1)

## 2019-02-01 LAB — CBC WITH DIFFERENTIAL/PLATELET
Abs Immature Granulocytes: 0.02 10*3/uL (ref 0.00–0.07)
Basophils Absolute: 0 10*3/uL (ref 0.0–0.1)
Basophils Relative: 1 %
Eosinophils Absolute: 0.2 10*3/uL (ref 0.0–0.5)
Eosinophils Relative: 8 %
HCT: 29.8 % — ABNORMAL LOW (ref 39.0–52.0)
Hemoglobin: 9.6 g/dL — ABNORMAL LOW (ref 13.0–17.0)
Immature Granulocytes: 1 %
Lymphocytes Relative: 13 %
Lymphs Abs: 0.3 10*3/uL — ABNORMAL LOW (ref 0.7–4.0)
MCH: 31.9 pg (ref 26.0–34.0)
MCHC: 32.2 g/dL (ref 30.0–36.0)
MCV: 99 fL (ref 80.0–100.0)
Monocytes Absolute: 0.3 10*3/uL (ref 0.1–1.0)
Monocytes Relative: 12 %
Neutro Abs: 1.6 10*3/uL — ABNORMAL LOW (ref 1.7–7.7)
Neutrophils Relative %: 65 %
Platelets: 113 10*3/uL — ABNORMAL LOW (ref 150–400)
RBC: 3.01 MIL/uL — ABNORMAL LOW (ref 4.22–5.81)
RDW: 13.9 % (ref 11.5–15.5)
WBC: 2.4 10*3/uL — ABNORMAL LOW (ref 4.0–10.5)
nRBC: 0 % (ref 0.0–0.2)

## 2019-02-01 MED ORDER — SODIUM CHLORIDE 0.9 % IV SOLN
225.0000 mg/m2/d | INTRAVENOUS | Status: DC
  Administered 2019-02-01: 1550 mg via INTRAVENOUS
  Filled 2019-02-01: qty 31

## 2019-02-01 MED ORDER — SODIUM CHLORIDE 0.9% FLUSH
10.0000 mL | Freq: Once | INTRAVENOUS | Status: AC
  Administered 2019-02-01: 10 mL via INTRAVENOUS
  Filled 2019-02-01: qty 10

## 2019-02-01 MED ORDER — HEPARIN SOD (PORK) LOCK FLUSH 100 UNIT/ML IV SOLN: 500.0000 [IU] | Freq: Once | INTRAVENOUS | Status: DC

## 2019-02-02 ENCOUNTER — Inpatient Hospital Stay: Payer: Medicare Other

## 2019-02-02 ENCOUNTER — Other Ambulatory Visit: Payer: Self-pay

## 2019-02-02 ENCOUNTER — Ambulatory Visit
Admission: RE | Admit: 2019-02-02 | Discharge: 2019-02-02 | Disposition: A | Discharge status: Home / Self Care | Payer: Medicare Other | Source: Ambulatory Visit | Attending: Radiation Oncology | Admitting: Radiation Oncology

## 2019-02-02 ENCOUNTER — Encounter: Payer: Self-pay | Admitting: Oncology

## 2019-02-02 ENCOUNTER — Ambulatory Visit: Payer: Medicare Other

## 2019-02-02 DIAGNOSIS — Z510 Encounter for antineoplastic radiation therapy: Secondary | ICD-10-CM | POA: Diagnosis not present

## 2019-02-02 NOTE — Progress Notes (Signed)
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 He was recently found to have iron deficiency anemia which led to a colonoscopy On 07/15/2018. Colonoscopy showed a villous partially obstructing medium-sized mass in the mid sigmoid colon. The mass was partially circumferential measuring 5 cm. There was another infiltrative partially obstructing medium-sized mass found in the rectosigmoid colon which also measured 5 cm. Pathology from the sigmoid colon mass showed high-grade dysplasia at least involving an adenomatous lesion with villous features. A more serious process is not excluded. Rectosigmoid mass biopsy showed invasive colorectal adenocarcinoma.  Patient was referred to Dr. [name] for surgical management. Patient had a CT abdomen and pelvis with contrast which showed irregular asymmetric mural thickening of the proximal rectum measuring over 6 to 7 cm. Potential local nodal metastases with several subcentimeter iliac lymph nodes and presacral lymph nodes. No evidence of metastatic disease outside the pelvis.  Patient also had MRI of the pelvis with and without contrast which showed tumor length 8.5 cm with extension through muscularis propria decreased C. There is diffuse involvement of muscularis propria. No extramural vascular invasion or tumor thrombus. Shortest distance of  any tumor/note from mesorectal fascia 1 to 2 mm. No involvement of adjacent organs or pelvic sidewall. Mesorectal lymph nodes within perirectal fat including 6 mm node and a low sigmoid mesocolon node measuring 6 mm. Another 6 mm node within the low sigmoid mesocolon representing extremity rectal lymphadenopathy. T3N2 by MRI. Distance from tumor to internal anal sphincter is 6.2 cm.  Plan is to proceed with total neoadjuvant chemotherapy followed by definitive surgery.Patient completed 8 cycles of neoadjuvant FOLFOX chemotherapy on 12/07/2018. Interim scans showed similar-appearing irregular wall thickening of the rectum but no retroperitoneal pelvic adenopathy  Interval history-reports feeling well.  Denies any fever, cough, shortness of breath.  Left knee pain is well controlled.  Denies any constipation diarrhea, nausea or vomiting  ECOG PS- 1 Pain scale- 0 Opioid associated constipation- no  Review of systems- Review of Systems  Constitutional: Positive for malaise/fatigue. Negative for chills, fever and weight loss.  HENT: Negative for congestion, ear discharge and nosebleeds.   Eyes: Negative for blurred vision.  Respiratory: Negative for cough, hemoptysis, sputum production, shortness of breath and wheezing.   Cardiovascular: Negative for chest pain, palpitations, orthopnea and claudication.  Gastrointestinal: Negative for abdominal pain, blood in stool, constipation, diarrhea, heartburn, melena, nausea and vomiting.  Genitourinary: Negative for dysuria, flank pain, frequency, hematuria and urgency.  Musculoskeletal: Negative for back pain, joint pain and myalgias.  Skin: Negative for rash.  Neurological: Negative for dizziness, tingling, focal weakness, seizures, weakness and headaches.  Endo/Heme/Allergies: Does not bruise/bleed easily.  Psychiatric/Behavioral: Negative for depression and suicidal ideas. The patient does not have insomnia.       No Known Allergies   Past  Medical History:  Diagnosis Date  . Anemia   . Coronary artery disease   . Diabetes mellitus without complication ([place])   . History  of hiatal hernia   . Hypercholesterolemia   . Hypertension   . Prostate cancer ([place]) 2000   Prostatectomy.   . 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[place]) 09/2018   Chemo tx's.      Past Surgical History:  Procedure Laterality Date  . COLONOSCOPY WITH PROPOFOL N/A 07/15/2018   Procedure: COLONOSCOPY WITH PROPOFOL;  Surgeon: [name], [name], MD;  Location: [facility] ENDOSCOPY;  Service: Gastroenterology;  Laterality: N/A;  . ESOPHAGOGASTRODUODENOSCOPY N/A 10/12/2018   Procedure: ESOPHAGOGASTRODUODENOSCOPY (EGD);  Surgeon: [name], MD;  Location: [facility] ENDOSCOPY;  Service: Gastroenterology;  Laterality: N/A;  . ESOPHAGOGASTRODUODENOSCOPY (EGD) WITH PROPOFOL N/A 07/15/2018   Procedure: ESOPHAGOGASTRODUODENOSCOPY (EGD) WITH PROPOFOL;  Surgeon: [name], [name], MD;  Location: [facility] ENDOSCOPY;  Service: Gastroenterology;  Laterality: N/A;  . PORTACATH PLACEMENT N/A 08/13/2018   Procedure: INSERTION PORT-A-CATH;  Surgeon: [name], MD;  Location: [facility] ORS;  Service: General;  Laterality: N/A;  . PROSTATE SURGERY      Social History   Socioeconomic History  . Marital status: Widowed    Spouse name: Not on file  . Number of children: 0  . Years of education: Not on file  . Highest education level: Not on file  Occupational History  . Occupation: Pharmacist, hospital    Comment: Retired  Scientific laboratory technician  . Financial resource strain: Not hard at all  . Food insecurity    Worry: Never true    Inability: Never true  . Transportation needs    Medical: No    Non-medical: No  Tobacco Use  . Smoking status: Never Smoker  . Smokeless tobacco: Never Used  Substance and Sexual Activity  . Alcohol use: Not Currently  . Drug use: Never  . Sexual activity: Not Currently  Lifestyle  . Physical activity    Days per week: 2 days    Minutes per session: 30 min  . Stress: Only a  little  Relationships  . Social connections    Talks on phone: More than three times a week    Gets together: More than three times a week    Attends religious service: Not on file    Active member of club or organization: Not on file    Attends meetings of clubs or organizations: Not on file    Relationship status: Widowed  . Intimate partner violence    Fear of current or ex partner: No    Emotionally abused: No    Physically abused: No    Forced sexual activity: No  Other Topics Concern  . 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  His wife was a Camera operator and they traveled frequently.  Several nieces and nephews.  He is the youngest of his brothers and sisters and only surviving.    No family history on file.   Current Outpatient Medications:  .  baclofen (LIORESAL) 10 MG tablet, Take 1 tablet by mouth 1 day or 1 dose., Disp: , Rfl:  .  ezetimibe (ZETIA) 10 MG tablet, Take 10 mg by mouth every morning. , Disp: , Rfl:  .  HUMALOG MIX 75/25 KWIKPEN (75-25) 100 UNIT/ML Kwikpen, Inject 4 Units into the skin every morning., Disp: 15 mL, Rfl: 11 .  levofloxacin (LEVAQUIN) 500 MG tablet, Take 1 tablet (500 mg total) by mouth daily., Disp: 30 tablet, Rfl: 0 .  magic mouthwash w/lidocaine SOLN, Take 5 mLs  by mouth 4 (four) times daily as needed for mouth pain., Disp: 480 mL, Rfl: 1 .  metoprolol succinate (TOPROL-XL) 50 MG 24 hr tablet, Take 1 tablet by mouth 1 day or 1 dose., Disp: , Rfl:  .  [START ON 03/21/2019] metroNIDAZOLE (FLAGYL) 500 MG tablet, Take 1 tablet by mouth., Disp: , Rfl:  .  [START ON 03/21/2019] neomycin (MYCIFRADIN) 500 MG tablet, 1 tablet., Disp: , Rfl:  .  nystatin (MYCOSTATIN) 100000 UNIT/ML suspension, TAKE 1 TEASPOONSFUL BY MOUTH 4 TIMES DAILY, Disp: 120 mL, Rfl: 1 .  OLANZapine (ZYPREXA) 10 MG tablet, Take 1 tablet (10 mg  total) by mouth at bedtime., Disp: 30 tablet, Rfl: 0 .  ondansetron (ZOFRAN) 8 MG tablet, Take 1 tablet by mouth as needed., Disp: , Rfl:  .  oxyCODONE (OXY IR/ROXICODONE) 5 MG immediate release tablet, Take 1 tablet (5 mg total) by mouth every 6 (six) hours as needed for severe pain., Disp: 60 tablet, Rfl: 0 .  pantoprazole (PROTONIX) 40 MG tablet, Take 1 tablet (40 mg total) by mouth 2 (two) times daily before a meal., Disp: 60 tablet, Rfl: 0 .  rivaroxaban (XARELTO) 20 MG TABS tablet, Take 1 tablet (20 mg total) by mouth daily with supper., Disp: 30 tablet, Rfl: 3 .  sucralfate (CARAFATE) 1 g tablet, Take 1 tablet (1 g total) by mouth 2 (two) times daily. Dissolve in 3-4 tbsp warm water, swish and swallow, Disp: 60 tablet, Rfl: 2 .  traZODone (DESYREL) 50 MG tablet, Take 50 mg by mouth at bedtime as needed for sleep. , Disp: , Rfl:  No current facility-administered medications for this visit.   Facility-Administered Medications Ordered in Other Visits:  .  heparin lock flush 100 unit/mL, 500 Units, Intravenous, Once, Sindy Guadeloupe, MD .  sodium chloride flush (NS) 0.9 % injection 10 mL, 10 mL, Intravenous, Once, Sindy Guadeloupe, MD .  sodium chloride flush (NS) 0.9 % injection 10 mL, 10 mL, Intravenous, Once, Sindy Guadeloupe, MD .  sodium chloride flush (NS) 0.9 % injection 10 mL, 10 mL, Intravenous, PRN, Sindy Guadeloupe, MD  Physical exam:  Vitals:   02/01/19 0946  BP: (!) 156/83  Pulse: 62  Temp: 98.7 F (37.1 C)  TempSrc: Tympanic  Weight: 150 lb (68 kg)  Height: 5\' 4"  (1.626 m)   Physical Exam Constitutional:      General: He is not in acute distress. HENT:     Head: Normocephalic and atraumatic.     Mouth/Throat:     Mouth: Mucous membranes are moist.     Pharynx: Oropharynx is clear.  Eyes:     Pupils: Pupils are equal, round, and reactive to light.  Neck:     Musculoskeletal: Normal range of motion.  Cardiovascular:     Rate and Rhythm: Normal rate and regular rhythm.      Heart sounds: Normal heart sounds.  Pulmonary:     Effort: Pulmonary effort is normal.     Breath sounds: Normal breath sounds.  Abdominal:     General: Bowel sounds are normal.     Palpations: Abdomen is soft.  Skin:    General: Skin is warm and dry.  Neurological:     Mental Status: He is alert and oriented to person, place, and time.      CMP Latest Ref Rng & Units 02/01/2019  Glucose 70 - 99 mg/dL 128(H)  BUN 8 - 23 mg/dL 13  Creatinine 0.61 - 1.24 mg/dL 0.84  Sodium 135 - 145 mmol/L 135  Potassium 3.5 - 5.1 mmol/L 3.5  Chloride 98 - 111 mmol/L 104  CO2 22 - 32 mmol/L 25  Calcium 8.9 - 10.3 mg/dL 8.5(L)  Total Protein 6.5 - 8.1 g/dL 6.3(L)  Total Bilirubin 0.3 - 1.2 mg/dL 0.4  Alkaline Phos 38 - 126 U/L 58  AST 15 - 41 U/L 17  ALT 0 - 44 U/L 13   CBC Latest Ref Rng & Units 02/01/2019  WBC 4.0 - 10.5 K/uL 2.4(L)  Hemoglobin 13.0 - 17.0 g/dL 9.6(L)  Hematocrit 39.0 - 52.0 % 29.8(L)  Platelets 150 - 400 K/uL 113(L)      Assessment and plan- Patient is a 78 y.o. male male withstage III rectal adencarcinoma currently undergoing neoadjuvant chemotherapy.He is s/p 8 cycles of neoadjuvant FOLFOX chemotherapy. He is currently undergoing concurrent chemo/RT.  he is here for on treatment assessment prior to next cycle of continuous infusional 5-FU chemotherapy  Patient received continuous 5-FU chemotherapy at 1000 mg per metered squared on week 1 of chemotherapy.  This is week 6 and he will be finishing chemotherapy in 1 week's time.  I plan to give him 5-FU chemotherapy at 250 mg per metered square per day day 1 to be performed.  This total dose will be infused over 7 days and patient will come back in 1 week's time for pump disconnect.  He does have chemo-induced neutropenia and his ANC is 1.6 today.  He cannot receive Neupogen or Neulasta since he is receiving concurrent radiation.  He is currently on Levaquin prophylaxis.  I will repeat his CBC with differential in 1  week's time and I will see him back in 2 weeks with CBC with differential, CMP for possible IV fluids   Visit Diagnosis 1. Encounter for antineoplastic chemotherapy   2. Chemotherapy induced neutropenia (HCC)   3. Rectal cancer [facility])      Dr. Randa Evens, MD, MPH [facility] [id number] 02/02/2019 10:03 AM

## 2019-02-03 ENCOUNTER — Other Ambulatory Visit: Payer: Self-pay

## 2019-02-03 ENCOUNTER — Ambulatory Visit
Admission: RE | Admit: 2019-02-03 | Discharge: 2019-02-03 | Disposition: A | Discharge status: Home / Self Care | Payer: Medicare Other | Source: Ambulatory Visit | Attending: Radiation Oncology | Admitting: Radiation Oncology

## 2019-02-03 DIAGNOSIS — Z510 Encounter for antineoplastic radiation therapy: Secondary | ICD-10-CM | POA: Diagnosis not present

## 2019-02-08 ENCOUNTER — Other Ambulatory Visit: Payer: Self-pay

## 2019-02-08 ENCOUNTER — Other Ambulatory Visit: Payer: Self-pay | Admitting: *Deleted

## 2019-02-08 ENCOUNTER — Ambulatory Visit
Admission: RE | Admit: 2019-02-08 | Discharge: 2019-02-08 | Disposition: A | Discharge status: Home / Self Care | Payer: Medicare Other | Source: Ambulatory Visit | Attending: Radiation Oncology | Admitting: Radiation Oncology

## 2019-02-08 ENCOUNTER — Telehealth: Payer: Self-pay | Admitting: *Deleted

## 2019-02-08 ENCOUNTER — Inpatient Hospital Stay: Payer: Medicare Other

## 2019-02-08 VITALS — BP 140/80 | HR 77 | Resp 18

## 2019-02-08 DIAGNOSIS — C20 Malignant neoplasm of rectum: Secondary | ICD-10-CM | POA: Diagnosis not present

## 2019-02-08 DIAGNOSIS — Z510 Encounter for antineoplastic radiation therapy: Secondary | ICD-10-CM | POA: Diagnosis not present

## 2019-02-08 LAB — CBC WITH DIFFERENTIAL/PLATELET
Abs Immature Granulocytes: 0.02 10*3/uL (ref 0.00–0.07)
Basophils Absolute: 0 10*3/uL (ref 0.0–0.1)
Basophils Relative: 0 %
Eosinophils Absolute: 0.3 10*3/uL (ref 0.0–0.5)
Eosinophils Relative: 12 %
HCT: 29.5 % — ABNORMAL LOW (ref 39.0–52.0)
Hemoglobin: 9.7 g/dL — ABNORMAL LOW (ref 13.0–17.0)
Immature Granulocytes: 1 %
Lymphocytes Relative: 15 %
Lymphs Abs: 0.4 10*3/uL — ABNORMAL LOW (ref 0.7–4.0)
MCH: 31.8 pg (ref 26.0–34.0)
MCHC: 32.9 g/dL (ref 30.0–36.0)
MCV: 96.7 fL (ref 80.0–100.0)
Monocytes Absolute: 0.3 10*3/uL (ref 0.1–1.0)
Monocytes Relative: 12 %
Neutro Abs: 1.4 10*3/uL — ABNORMAL LOW (ref 1.7–7.7)
Neutrophils Relative %: 60 %
Platelets: 134 10*3/uL — ABNORMAL LOW (ref 150–400)
RBC: 3.05 MIL/uL — ABNORMAL LOW (ref 4.22–5.81)
RDW: 13.7 % (ref 11.5–15.5)
WBC: 2.4 10*3/uL — ABNORMAL LOW (ref 4.0–10.5)
nRBC: 0 % (ref 0.0–0.2)

## 2019-02-08 LAB — COMPREHENSIVE METABOLIC PANEL
ALT: 12 U/L (ref 0–44)
AST: 16 U/L (ref 15–41)
Albumin: 3.5 g/dL (ref 3.5–5.0)
Alkaline Phosphatase: 55 U/L (ref 38–126)
Anion gap: 8 (ref 5–15)
BUN: 15 mg/dL (ref 8–23)
CO2: 23 mmol/L (ref 22–32)
Calcium: 8.5 mg/dL — ABNORMAL LOW (ref 8.9–10.3)
Chloride: 104 mmol/L (ref 98–111)
Creatinine, Ser: 0.93 mg/dL (ref 0.61–1.24)
GFR calc Af Amer: >60 mL/min (ref >60)
GFR calc non Af Amer: >60 mL/min (ref >60)
Glucose, Bld: 118 mg/dL — ABNORMAL HIGH (ref 70–99)
Potassium: 3.2 mmol/L — ABNORMAL LOW (ref 3.5–5.1)
Sodium: 135 mmol/L (ref 135–145)
Total Bilirubin: 0.4 mg/dL (ref 0.3–1.2)
Total Protein: 6.4 g/dL — ABNORMAL LOW (ref 6.5–8.1)

## 2019-02-08 MED ORDER — HEPARIN SOD (PORK) LOCK FLUSH 100 UNIT/ML IV SOLN
500.0000 [IU] | Freq: Once | INTRAVENOUS | Status: AC
  Administered 2019-02-08: 500 [IU] via INTRAVENOUS

## 2019-02-08 MED ORDER — POTASSIUM CHLORIDE 20 MEQ/15ML (10%) PO SOLN: 20.0000 meq | Freq: Every day | ORAL | 0 refills | Status: DC

## 2019-02-08 MED ORDER — HEPARIN SOD (PORK) LOCK FLUSH 100 UNIT/ML IV SOLN: 500.0000 [IU] | Freq: Once | INTRAVENOUS | Status: DC | PRN

## 2019-02-08 NOTE — Telephone Encounter (Signed)
Called pt and let him know that pt potassium was low today and dr Janese Banks wanted me to call in liquid potassium that would be better for his esophagitis. He will pick it up tom. From pharmacy

## 2019-02-09 ENCOUNTER — Inpatient Hospital Stay: Payer: Medicare Other

## 2019-02-12 ENCOUNTER — Inpatient Hospital Stay: Payer: Medicare Other | Admitting: Oncology

## 2019-02-12 ENCOUNTER — Inpatient Hospital Stay: Payer: Medicare Other

## 2019-02-15 ENCOUNTER — Inpatient Hospital Stay (HOSPITAL_BASED_OUTPATIENT_CLINIC_OR_DEPARTMENT_OTHER): Payer: Medicare Other | Admitting: Oncology

## 2019-02-15 ENCOUNTER — Inpatient Hospital Stay: Payer: Medicare Other

## 2019-02-15 ENCOUNTER — Other Ambulatory Visit: Payer: Self-pay

## 2019-02-15 ENCOUNTER — Inpatient Hospital Stay: Payer: Medicare Other | Attending: Oncology | Admitting: *Deleted

## 2019-02-15 ENCOUNTER — Encounter: Payer: Self-pay | Admitting: Oncology

## 2019-02-15 VITALS — BP 142/75 | HR 68 | Temp 96.7°F | Wt 153.3 lb

## 2019-02-15 DIAGNOSIS — E785 Hyperlipidemia, unspecified: Secondary | ICD-10-CM | POA: Insufficient documentation

## 2019-02-15 DIAGNOSIS — Z9221 Personal history of antineoplastic chemotherapy: Secondary | ICD-10-CM | POA: Diagnosis not present

## 2019-02-15 DIAGNOSIS — D701 Agranulocytosis secondary to cancer chemotherapy: Secondary | ICD-10-CM | POA: Diagnosis not present

## 2019-02-15 DIAGNOSIS — T451X5A Adverse effect of antineoplastic and immunosuppressive drugs, initial encounter: Secondary | ICD-10-CM

## 2019-02-15 DIAGNOSIS — I2510 Atherosclerotic heart disease of native coronary artery without angina pectoris: Secondary | ICD-10-CM | POA: Diagnosis not present

## 2019-02-15 DIAGNOSIS — Z923 Personal history of irradiation: Secondary | ICD-10-CM | POA: Insufficient documentation

## 2019-02-15 DIAGNOSIS — R5381 Other malaise: Secondary | ICD-10-CM | POA: Diagnosis not present

## 2019-02-15 DIAGNOSIS — I10 Essential (primary) hypertension: Secondary | ICD-10-CM | POA: Insufficient documentation

## 2019-02-15 DIAGNOSIS — E119 Type 2 diabetes mellitus without complications: Secondary | ICD-10-CM | POA: Insufficient documentation

## 2019-02-15 DIAGNOSIS — C20 Malignant neoplasm of rectum: Secondary | ICD-10-CM | POA: Diagnosis not present

## 2019-02-15 DIAGNOSIS — Z5111 Encounter for antineoplastic chemotherapy: Secondary | ICD-10-CM

## 2019-02-15 DIAGNOSIS — D509 Iron deficiency anemia, unspecified: Secondary | ICD-10-CM | POA: Diagnosis not present

## 2019-02-15 DIAGNOSIS — E7800 Pure hypercholesterolemia, unspecified: Secondary | ICD-10-CM | POA: Insufficient documentation

## 2019-02-15 DIAGNOSIS — R5383 Other fatigue: Secondary | ICD-10-CM | POA: Diagnosis not present

## 2019-02-15 DIAGNOSIS — Z7901 Long term (current) use of anticoagulants: Secondary | ICD-10-CM | POA: Insufficient documentation

## 2019-02-15 DIAGNOSIS — Z8546 Personal history of malignant neoplasm of prostate: Secondary | ICD-10-CM | POA: Insufficient documentation

## 2019-02-15 DIAGNOSIS — Z79899 Other long term (current) drug therapy: Secondary | ICD-10-CM | POA: Diagnosis not present

## 2019-02-15 DIAGNOSIS — Z95828 Presence of other vascular implants and grafts: Secondary | ICD-10-CM

## 2019-02-15 DIAGNOSIS — Z9079 Acquired absence of other genital organ(s): Secondary | ICD-10-CM | POA: Diagnosis not present

## 2019-02-15 LAB — CBC WITH DIFFERENTIAL/PLATELET
Abs Immature Granulocytes: 0.01 10*3/uL (ref 0.00–0.07)
Basophils Absolute: 0 10*3/uL (ref 0.0–0.1)
Basophils Relative: 1 %
Eosinophils Absolute: 0.2 10*3/uL (ref 0.0–0.5)
Eosinophils Relative: 8 %
HCT: 30.6 % — ABNORMAL LOW (ref 39.0–52.0)
Hemoglobin: 9.9 g/dL — ABNORMAL LOW (ref 13.0–17.0)
Immature Granulocytes: 1 %
Lymphocytes Relative: 16 %
Lymphs Abs: 0.3 10*3/uL — ABNORMAL LOW (ref 0.7–4.0)
MCH: 31.4 pg (ref 26.0–34.0)
MCHC: 32.4 g/dL (ref 30.0–36.0)
MCV: 97.1 fL (ref 80.0–100.0)
Monocytes Absolute: 0.3 10*3/uL (ref 0.1–1.0)
Monocytes Relative: 14 %
Neutro Abs: 1.3 10*3/uL — ABNORMAL LOW (ref 1.7–7.7)
Neutrophils Relative %: 60 %
Platelets: 143 10*3/uL — ABNORMAL LOW (ref 150–400)
RBC: 3.15 MIL/uL — ABNORMAL LOW (ref 4.22–5.81)
RDW: 13.6 % (ref 11.5–15.5)
WBC: 2.1 10*3/uL — ABNORMAL LOW (ref 4.0–10.5)
nRBC: 0 % (ref 0.0–0.2)

## 2019-02-15 LAB — COMPREHENSIVE METABOLIC PANEL
ALT: 13 U/L (ref 0–44)
AST: 18 U/L (ref 15–41)
Albumin: 3.4 g/dL — ABNORMAL LOW (ref 3.5–5.0)
Alkaline Phosphatase: 54 U/L (ref 38–126)
Anion gap: 7 (ref 5–15)
BUN: 15 mg/dL (ref 8–23)
CO2: 25 mmol/L (ref 22–32)
Calcium: 8.6 mg/dL — ABNORMAL LOW (ref 8.9–10.3)
Chloride: 104 mmol/L (ref 98–111)
Creatinine, Ser: 1.03 mg/dL (ref 0.61–1.24)
GFR calc Af Amer: >60 mL/min (ref >60)
GFR calc non Af Amer: >60 mL/min (ref >60)
Glucose, Bld: 108 mg/dL — ABNORMAL HIGH (ref 70–99)
Potassium: 3.7 mmol/L (ref 3.5–5.1)
Sodium: 136 mmol/L (ref 135–145)
Total Bilirubin: 0.6 mg/dL (ref 0.3–1.2)
Total Protein: 6.2 g/dL — ABNORMAL LOW (ref 6.5–8.1)

## 2019-02-15 MED ORDER — SODIUM CHLORIDE 0.9% FLUSH
10.0000 mL | Freq: Once | INTRAVENOUS | Status: AC
  Administered 2019-02-15: 13:00:00 10 mL via INTRAVENOUS
  Filled 2019-02-15: qty 10

## 2019-02-15 NOTE — Progress Notes (Signed)
Patient stated that he would like to know how long he will need to take his antibodies and potassium.

## 2019-02-16 ENCOUNTER — Inpatient Hospital Stay: Payer: Medicare Other

## 2019-02-17 NOTE — Progress Notes (Signed)
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 He was recently found to have iron deficiency anemia which led to a colonoscopy On 07/15/2018. Colonoscopy showed a villous partially obstructing medium-sized mass in the mid sigmoid colon. The mass was partially circumferential measuring 5 cm. There was another infiltrative partially obstructing medium-sized mass found in the rectosigmoid colon which also measured 5 cm. Pathology from the sigmoid colon mass showed high-grade dysplasia at least involving an adenomatous lesion with villous features. A more serious process is not excluded. Rectosigmoid mass biopsy showed invasive colorectal adenocarcinoma.  Patient was referred to Dr. [name] for surgical management. Patient had a CT abdomen and pelvis with contrast which showed irregular asymmetric mural thickening of the proximal rectum measuring over 6 to 7 cm. Potential local nodal metastases with several subcentimeter iliac lymph nodes and presacral lymph nodes. No evidence of metastatic disease outside the pelvis.  Patient also had MRI of the pelvis with and without contrast which showed tumor length 8.5 cm with extension through muscularis propria decreased C. There is diffuse involvement of muscularis propria. No extramural vascular invasion or tumor thrombus. Shortest distance of  any tumor/note from mesorectal fascia 1 to 2 mm. No involvement of adjacent organs or pelvic sidewall. Mesorectal lymph nodes within perirectal fat including 6 mm node and a low sigmoid mesocolon node measuring 6 mm. Another 6 mm node within the low sigmoid mesocolon representing extremity rectal lymphadenopathy. T3N2 by MRI. Distance from tumor to internal anal sphincter is 6.2 cm.  Plan is to proceed with total neoadjuvant chemotherapy followed by definitive surgery.Patient completed 8 cycles of neoadjuvant FOLFOX chemotherapy on 12/07/2018. Interim scans showed similar-appearing irregular wall thickening of the rectum but no retroperitoneal pelvic adenopathy.  Patient completed concurrent chemoradiation end of November 2020.   Interval history-currently patient reports feeling well and denies any complaints at this time.  Denies any fever, cough, nausea vomiting constipation or diarrhea.  Denies any mouth sores  ECOG PS- 1 Pain scale- 0  Review of systems- Review of Systems  Constitutional: Positive for malaise/fatigue. Negative for chills, fever and weight loss.  HENT: Negative for congestion, ear discharge and nosebleeds.   Eyes: Negative for blurred vision.  Respiratory: Negative for cough, hemoptysis, sputum production, shortness of breath and wheezing.   Cardiovascular: Negative for chest pain, palpitations, orthopnea and claudication.  Gastrointestinal: Negative for abdominal pain, blood in stool, constipation, diarrhea, heartburn, melena, nausea and vomiting.  Genitourinary: Negative for dysuria, flank pain, frequency, hematuria and urgency.  Musculoskeletal: Negative for back pain, joint pain and myalgias.  Skin: Negative for rash.  Neurological: Negative for dizziness, tingling, focal weakness, seizures, weakness and headaches.  Endo/Heme/Allergies: Does not bruise/bleed easily.  Psychiatric/Behavioral: Negative for depression and suicidal ideas. The patient does not have  insomnia.       No Known Allergies   Past Medical History:  Diagnosis Date  . Anemia   . Coronary artery disease   . Diabetes mellitus without complication (  [place])   . History of hiatal hernia   . Hypercholesterolemia   . Hypertension   . Prostate cancer ([place]) 2000   Prostatectomy.   . 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[place]) 09/2018   Chemo tx's.      Past Surgical History:  Procedure Laterality Date  . COLONOSCOPY WITH PROPOFOL N/A 07/15/2018   Procedure: COLONOSCOPY WITH PROPOFOL;  Surgeon: [name], [name], MD;  Location: [facility] ENDOSCOPY;  Service: Gastroenterology;  Laterality: N/A;  . ESOPHAGOGASTRODUODENOSCOPY N/A 10/12/2018   Procedure: ESOPHAGOGASTRODUODENOSCOPY (EGD);  Surgeon: [name], MD;  Location: [facility] ENDOSCOPY;  Service: Gastroenterology;  Laterality: N/A;  . ESOPHAGOGASTRODUODENOSCOPY (EGD) WITH PROPOFOL N/A 07/15/2018   Procedure: ESOPHAGOGASTRODUODENOSCOPY (EGD) WITH PROPOFOL;  Surgeon: [name], [name], MD;  Location: [facility] ENDOSCOPY;  Service: Gastroenterology;  Laterality: N/A;  . PORTACATH PLACEMENT N/A 08/13/2018   Procedure: INSERTION PORT-A-CATH;  Surgeon: [name], MD;  Location: [facility] ORS;  Service: General;  Laterality: N/A;  . PROSTATE SURGERY      Social History   Socioeconomic History  . Marital status: Widowed    Spouse name: Not on file  . Number of children: 0  . Years of education: Not on file  . Highest education level: Not on file  Occupational History  . Occupation: Pharmacist, hospital    Comment: Retired  Scientific laboratory technician  . Financial resource strain: Not hard at all  . Food insecurity    Worry: Never true    Inability: Never true  . Transportation needs    Medical: No    Non-medical: No  Tobacco Use  . Smoking status: Never Smoker  . Smokeless tobacco: Never Used  Substance and Sexual Activity  . Alcohol use: Not Currently  . Drug use: Never  . Sexual activity: Not Currently  Lifestyle  . Physical activity    Days per week: 2 days     Minutes per session: 30 min  . Stress: Only a little  Relationships  . Social connections    Talks on phone: More than three times a week    Gets together: More than three times a week    Attends religious service: Not on file    Active member of club or organization: Not on file    Attends meetings of clubs or organizations: Not on file    Relationship status: Widowed  . Intimate partner violence    Fear of current or ex partner: No    Emotionally abused: No    Physically abused: No    Forced sexual activity: No  Other Topics Concern  . 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  His wife was a Camera operator and they traveled frequently.  Several nieces and nephews.  He is the youngest of his brothers and sisters and only surviving.    History reviewed. No pertinent family history.   Current Outpatient Medications:  .  baclofen (LIORESAL) 10 MG tablet, Take 1 tablet by mouth 1 day or 1 dose., Disp: , Rfl:  .  ezetimibe (ZETIA) 10 MG tablet, Take 10 mg by mouth every morning. , Disp: , Rfl:  .  HUMALOG MIX 75/25 KWIKPEN (75-25) 100 UNIT/ML Kwikpen, Inject 4 Units into the skin every morning., Disp: 15 mL, Rfl: 11 .  levofloxacin (LEVAQUIN) 500 MG tablet, Take 1 tablet (500 mg total) by mouth daily., Disp: 30 tablet, Rfl: 0 .  magic  mouthwash w/lidocaine SOLN, Take 5 mLs by mouth 4 (four) times daily as needed for mouth pain., Disp: 480 mL, Rfl: 1 .  metoprolol succinate (TOPROL-XL) 50 MG 24 hr tablet, Take 1 tablet by mouth 1 day or 1 dose., Disp: , Rfl:  .  [START ON 03/21/2019] metroNIDAZOLE (FLAGYL) 500 MG tablet, Take 1 tablet by mouth., Disp: , Rfl:  .  [START ON 03/21/2019] neomycin (MYCIFRADIN) 500 MG tablet, 1 tablet., Disp: , Rfl:  .  nystatin (MYCOSTATIN) 100000 UNIT/ML suspension, TAKE 1 TEASPOONSFUL BY MOUTH 4 TIMES DAILY, Disp: 120 mL, Rfl:  1 .  OLANZapine (ZYPREXA) 10 MG tablet, Take 1 tablet (10 mg total) by mouth at bedtime., Disp: 30 tablet, Rfl: 0 .  ondansetron (ZOFRAN) 8 MG tablet, Take 1 tablet by mouth as needed., Disp: , Rfl:  .  oxyCODONE (OXY IR/ROXICODONE) 5 MG immediate release tablet, Take 1 tablet (5 mg total) by mouth every 6 (six) hours as needed for severe pain., Disp: 60 tablet, Rfl: 0 .  pantoprazole (PROTONIX) 40 MG tablet, Take 1 tablet (40 mg total) by mouth 2 (two) times daily before a meal., Disp: 60 tablet, Rfl: 0 .  potassium chloride 20 MEQ/15ML (10%) SOLN, Take 15 mLs (20 mEq total) by mouth daily., Disp: 315 mL, Rfl: 0 .  rivaroxaban (XARELTO) 20 MG TABS tablet, Take 1 tablet (20 mg total) by mouth daily with supper., Disp: 30 tablet, Rfl: 3 .  sucralfate (CARAFATE) 1 g tablet, Take 1 tablet (1 g total) by mouth 2 (two) times daily. Dissolve in 3-4 tbsp warm water, swish and swallow, Disp: 60 tablet, Rfl: 2 .  traZODone (DESYREL) 50 MG tablet, Take 50 mg by mouth at bedtime as needed for sleep. , Disp: , Rfl:  No current facility-administered medications for this visit.   Facility-Administered Medications Ordered in Other Visits:  .  heparin lock flush 100 unit/mL, 500 Units, Intravenous, Once, Sindy Guadeloupe, MD .  sodium chloride flush (NS) 0.9 % injection 10 mL, 10 mL, Intravenous, Once, Sindy Guadeloupe, MD .  sodium chloride flush (NS) 0.9 % injection 10 mL, 10 mL, Intravenous, Once, Sindy Guadeloupe, MD .  sodium chloride flush (NS) 0.9 % injection 10 mL, 10 mL, Intravenous, PRN, Sindy Guadeloupe, MD  Physical exam:  Vitals:   02/15/19 1309  BP: (!) 142/75  Pulse: 68  Temp: (!) 96.7 F (35.9 C)  TempSrc: Tympanic  Weight: 153 lb 4.8 oz (69.5 kg)   Physical Exam Constitutional:      General: He is not in acute distress. HENT:     Head: Normocephalic and atraumatic.     Mouth/Throat:     Mouth: Mucous membranes are moist.     Pharynx: Oropharynx is clear.  Eyes:     Pupils: Pupils are  equal, round, and reactive to light.  Neck:     Musculoskeletal: Normal range of motion.  Cardiovascular:     Rate and Rhythm: Normal rate and regular rhythm.     Heart sounds: Normal heart sounds.  Pulmonary:     Effort: Pulmonary effort is normal.     Breath sounds: Normal breath sounds.  Abdominal:     General: Bowel sounds are normal.     Palpations: Abdomen is soft.  Skin:    General: Skin is warm and dry.  Neurological:     Mental Status: He is alert and oriented to person, place, and time.      CMP Latest Ref Rng &  Units 02/15/2019  Glucose 70 - 99 mg/dL 108(H)  BUN 8 - 23 mg/dL 15  Creatinine 0.61 - 1.24 mg/dL 1.03  Sodium 135 - 145 mmol/L 136  Potassium 3.5 - 5.1 mmol/L 3.7  Chloride 98 - 111 mmol/L 104  CO2 22 - 32 mmol/L 25  Calcium 8.9 - 10.3 mg/dL 8.6(L)  Total Protein 6.5 - 8.1 g/dL 6.2(L)  Total Bilirubin 0.3 - 1.2 mg/dL 0.6  Alkaline Phos 38 - 126 U/L 54  AST 15 - 41 U/L 18  ALT 0 - 44 U/L 13   CBC Latest Ref Rng & Units 02/15/2019  WBC 4.0 - 10.5 K/uL 2.1(L)  Hemoglobin 13.0 - 17.0 g/dL 9.9(L)  Hematocrit 39.0 - 52.0 % 30.6(L)  Platelets 150 - 400 K/uL 143(L)     Assessment and plan- Patient is a 78 y.o. male withstage III rectal adencarcinoma currently undergoing neoadjuvant chemotherapy.He is s/p 8 cycles of neoadjuvant FOLFOX chemotherapy.  He is s/p concurrent chemoradiation with continuous infusional 5-FU chemotherapy and here for toxicity check upon completion of chemoradiation  Patient has completed 4 months of FOLFOX chemotherapy followed by concurrent chemoradiation completing his total neoadjuvant treatment for rectal cancer.    He does have chemo-induced neutropenia which is likely to resolve in the next few weeks.  Hold off on Neupogen and he can stop his Levaquin prophylaxis at this time.  Anemia and thrombocytopenia also likely secondary to chemotherapy and is likely to improve in due course.  Patient will be seen by Dr. Sheryn Bison at [facility]  and will be scheduled to proceed with resection on 03/23/2019.  Defer decision for repeat scans to [facility].  He can also get his port taken out at the time of surgery or after surgery by Dr. [name]  I will see him back on 03/11/2019 with CBC with differential and CMP  Patient was previously on Xarelto for clot around his port site. He was reporting some concerns of rectal bleeding last week. I did stop his xarelto and his port will come out next month   Visit Diagnosis 1. 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[place])   2. Chemotherapy induced neutropenia ([facility])      Dr. Randa Evens, MD, MPH [facility] [id number] 02/17/2019 9:09 AM

## 2019-02-19 ENCOUNTER — Inpatient Hospital Stay: Payer: Medicare Other | Admitting: Oncology

## 2019-02-19 ENCOUNTER — Inpatient Hospital Stay: Payer: Medicare Other

## 2019-02-23 ENCOUNTER — Inpatient Hospital Stay: Payer: Medicare Other

## 2019-02-26 ENCOUNTER — Inpatient Hospital Stay: Payer: Medicare Other

## 2019-02-26 ENCOUNTER — Inpatient Hospital Stay: Payer: Medicare Other | Admitting: Oncology

## 2019-03-02 ENCOUNTER — Inpatient Hospital Stay: Payer: Medicare Other

## 2019-03-09 ENCOUNTER — Inpatient Hospital Stay: Payer: Medicare Other

## 2019-03-10 ENCOUNTER — Other Ambulatory Visit: Payer: Self-pay

## 2019-03-10 ENCOUNTER — Encounter: Payer: Self-pay | Admitting: Oncology

## 2019-03-10 NOTE — Progress Notes (Signed)
Patient stated that his surgery at [facility] for a colectomy will be on 03/23/2019 by Dr. Sheryn Bison. Patient wanted to know when he could have his port-a-cath removed.

## 2019-03-11 ENCOUNTER — Inpatient Hospital Stay (HOSPITAL_BASED_OUTPATIENT_CLINIC_OR_DEPARTMENT_OTHER): Payer: Medicare Other | Admitting: Oncology

## 2019-03-11 ENCOUNTER — Other Ambulatory Visit: Payer: Self-pay | Admitting: *Deleted

## 2019-03-11 ENCOUNTER — Telehealth: Payer: Self-pay | Admitting: *Deleted

## 2019-03-11 ENCOUNTER — Other Ambulatory Visit: Payer: Self-pay

## 2019-03-11 ENCOUNTER — Inpatient Hospital Stay: Payer: Medicare Other

## 2019-03-11 DIAGNOSIS — D701 Agranulocytosis secondary to cancer chemotherapy: Secondary | ICD-10-CM

## 2019-03-11 DIAGNOSIS — C20 Malignant neoplasm of rectum: Secondary | ICD-10-CM

## 2019-03-11 DIAGNOSIS — T451X5A Adverse effect of antineoplastic and immunosuppressive drugs, initial encounter: Secondary | ICD-10-CM

## 2019-03-11 LAB — COMPREHENSIVE METABOLIC PANEL
ALT: 15 U/L (ref 0–44)
AST: 18 U/L (ref 15–41)
Albumin: 3.6 g/dL (ref 3.5–5.0)
Alkaline Phosphatase: 55 U/L (ref 38–126)
Anion gap: 9 (ref 5–15)
BUN: 15 mg/dL (ref 8–23)
CO2: 24 mmol/L (ref 22–32)
Calcium: 8.7 mg/dL — ABNORMAL LOW (ref 8.9–10.3)
Chloride: 105 mmol/L (ref 98–111)
Creatinine, Ser: 1.01 mg/dL (ref 0.61–1.24)
GFR calc Af Amer: >60 mL/min (ref >60)
GFR calc non Af Amer: >60 mL/min (ref >60)
Glucose, Bld: 120 mg/dL — ABNORMAL HIGH (ref 70–99)
Potassium: 3.7 mmol/L (ref 3.5–5.1)
Sodium: 138 mmol/L (ref 135–145)
Total Bilirubin: 0.4 mg/dL (ref 0.3–1.2)
Total Protein: 6.6 g/dL (ref 6.5–8.1)

## 2019-03-11 LAB — CBC WITH DIFFERENTIAL/PLATELET
Abs Immature Granulocytes: 0 10*3/uL (ref 0.00–0.07)
Basophils Absolute: 0 10*3/uL (ref 0.0–0.1)
Basophils Relative: 2 %
Eosinophils Absolute: 0.2 10*3/uL (ref 0.0–0.5)
Eosinophils Relative: 7 %
HCT: 33.7 % — ABNORMAL LOW (ref 39.0–52.0)
Hemoglobin: 10.8 g/dL — ABNORMAL LOW (ref 13.0–17.0)
Immature Granulocytes: 0 %
Lymphocytes Relative: 20 %
Lymphs Abs: 0.5 10*3/uL — ABNORMAL LOW (ref 0.7–4.0)
MCH: 31.2 pg (ref 26.0–34.0)
MCHC: 32 g/dL (ref 30.0–36.0)
MCV: 97.4 fL (ref 80.0–100.0)
Monocytes Absolute: 0.3 10*3/uL (ref 0.1–1.0)
Monocytes Relative: 11 %
Neutro Abs: 1.5 10*3/uL — ABNORMAL LOW (ref 1.7–7.7)
Neutrophils Relative %: 60 %
Platelets: 152 10*3/uL (ref 150–400)
RBC: 3.46 MIL/uL — ABNORMAL LOW (ref 4.22–5.81)
RDW: 13.4 % (ref 11.5–15.5)
WBC: 2.4 10*3/uL — ABNORMAL LOW (ref 4.0–10.5)
nRBC: 0 % (ref 0.0–0.2)

## 2019-03-11 NOTE — Progress Notes (Signed)
I connected with Willie [name] on 03/11/19 at  2:00 PM EST by video enabled telemedicine visit and verified that I am speaking with the correct person using two identifiers.   I discussed the limitations, risks, security and privacy concerns of performing an evaluation and management service by telemedicine and the availability of in-person appointments. I also discussed with the patient that there may be a patient responsible charge related to this service. The patient expressed understanding and agreed to proceed.  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 He was recently found to have iron deficiency anemia which led to a colonoscopy On 07/15/2018. Colonoscopy showed a villous partially obstructing medium-sized mass in the mid sigmoid colon. The mass was partially circumferential measuring 5 cm. There was another infiltrative partially obstructing medium-sized mass found in the rectosigmoid colon which also measured 5 cm. Pathology from the sigmoid colon mass showed high-grade dysplasia at least involving an adenomatous lesion with villous features. A more serious process is not excluded. Rectosigmoid mass biopsy showed invasive colorectal adenocarcinoma.  Patient was referred to Dr. [name] for surgical management. Patient had a CT abdomen and pelvis with contrast which showed irregular asymmetric mural thickening of the proximal rectum measuring over 6 to 7 cm. Potential local nodal metastases with several subcentimeter iliac lymph nodes and presacral lymph nodes. No  evidence of metastatic disease outside the pelvis.  Patient also had MRI of the pelvis with and without contrast which showed tumor length 8.5 cm with extension through muscularis propria decreased C. There is diffuse involvement of muscularis propria. No extramural vascular invasion or tumor thrombus. Shortest distance of any tumor/note from mesorectal fascia 1 to 2 mm. No involvement of adjacent organs or pelvic sidewall. Mesorectal lymph nodes within perirectal fat including 6 mm node and a low sigmoid mesocolon node measuring 6 mm. Another 6 mm node within the low sigmoid mesocolon representing extremity rectal lymphadenopathy. T3N2 by MRI. Distance from tumor to internal anal sphincter is 6.2 cm.  Plan is to proceed with total neoadjuvant chemotherapy followed by definitive surgery.Patient completed 8 cycles of neoadjuvant FOLFOX chemotherapy on 12/07/2018. Interim scans showed similar-appearing irregular wall thickening of the rectum but no retroperitoneal pelvic adenopathy.  Patient completed concurrent chemoradiation end of November 2020.   Interval history: Overall he feels well and denies any complaints at this time.   Review of Systems  Constitutional: Negative for chills, fever, malaise/fatigue and weight loss.  HENT: Negative for congestion, ear discharge and nosebleeds.   Eyes: Negative for blurred vision.  Respiratory: Negative for cough, hemoptysis, sputum production, shortness of breath and wheezing.   Cardiovascular: Negative for chest pain, palpitations, orthopnea and claudication.  Gastrointestinal: Negative for abdominal pain, blood in stool, constipation, diarrhea, heartburn, melena, nausea and vomiting.  Genitourinary: Negative for dysuria, flank pain, frequency, hematuria and urgency.  Musculoskeletal: Negative for back pain, joint pain and myalgias.  Skin: Negative for rash.  Neurological: Negative for dizziness, tingling, focal weakness, seizures, weakness  and headaches.  Endo/Heme/Allergies: Does not bruise/bleed easily.  Psychiatric/Behavioral: Negative for depression and suicidal ideas. The patient does not have insomnia.     No Known  Allergies  Past Medical History:  Diagnosis Date  . Anemia   . Coronary artery disease   . Diabetes mellitus without complication ([place])   . History of hiatal hernia   . Hypercholesterolemia   . Hypertension   . Prostate cancer ([place]) 2000   Prostatectomy.   . 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[place]) 09/2018   Chemo tx's.     Past Surgical History:  Procedure Laterality Date  . COLONOSCOPY WITH PROPOFOL N/A 07/15/2018   Procedure: COLONOSCOPY WITH PROPOFOL;  Surgeon: [name], [name], MD;  Location: [facility] ENDOSCOPY;  Service: Gastroenterology;  Laterality: N/A;  . ESOPHAGOGASTRODUODENOSCOPY N/A 10/12/2018   Procedure: ESOPHAGOGASTRODUODENOSCOPY (EGD);  Surgeon: [name], MD;  Location: [facility] ENDOSCOPY;  Service: Gastroenterology;  Laterality: N/A;  . ESOPHAGOGASTRODUODENOSCOPY (EGD) WITH PROPOFOL N/A 07/15/2018   Procedure: ESOPHAGOGASTRODUODENOSCOPY (EGD) WITH PROPOFOL;  Surgeon: [name], [name], MD;  Location: [facility] ENDOSCOPY;  Service: Gastroenterology;  Laterality: N/A;  . PORTACATH PLACEMENT N/A 08/13/2018   Procedure: INSERTION PORT-A-CATH;  Surgeon: [name], MD;  Location: [facility] ORS;  Service: General;  Laterality: N/A;  . PROSTATE SURGERY      Social History   Socioeconomic History  . Marital status: Widowed    Spouse name: Not on file  . Number of children: 0  . Years of education: Not on file  . Highest education level: Not on file  Occupational History  . Occupation: Pharmacist, hospital    Comment: Retired  Tobacco Use  . Smoking status: Never Smoker  . Smokeless tobacco: Never Used  Substance and Sexual Activity  . Alcohol use: Not Currently  . Drug use: Never  . Sexual activity: Not Currently  Other Topics Concern  . Not on file  Social History Narrative   Patient is retired Programmer, multimedia.  He was widowed approximately 1 year ago (2019).  His wife was a Camera operator and they traveled frequently.  Several nieces and nephews.  He is the youngest of his brothers and sisters and only surviving.   Social Determinants of Health   Financial Resource Strain:   . Difficulty of Paying Living Expenses: Not on file  Food Insecurity:   . Worried About Charity fundraiser in the Last Year: Not on file  . Ran Out of Food in the Last Year: Not on file  Transportation Needs:   . Lack of Transportation (Medical): Not on file  . Lack of Transportation (Non-Medical): Not on file  Physical Activity:   . Days of Exercise per Week: Not on file  . Minutes of Exercise per Session: Not on file  Stress:   . Feeling of Stress : Not on file  Social Connections: Unknown  . Frequency of Communication with Friends and Family: More than three times a week  . Frequency of Social Gatherings with Friends and Family: More than three times a week  . Attends Religious Services: Not on file  . Active Member of Clubs or Organizations: Not on file  . Attends Archivist Meetings: Not on file  . Marital Status: Not on file  Intimate Partner Violence: Not At Risk  . Fear of Current or Ex-Partner: No  . Emotionally Abused: No  . Physically Abused: No  . Sexually Abused: No    History reviewed. No pertinent family history.   Current Outpatient Medications:  .  baclofen (LIORESAL) 10 MG tablet, Take 1 tablet by mouth 1 day or 1 dose., Disp: , Rfl:  .  ezetimibe (ZETIA) 10 MG tablet, Take 10 mg by mouth every morning. , Disp: , Rfl:  .  HUMALOG MIX 75/25 KWIKPEN (75-25) 100 UNIT/ML Kwikpen, Inject 4 Units into the skin every morning., Disp: 15 mL, Rfl: 11 .  magic mouthwash w/lidocaine SOLN, Take 5 mLs by mouth 4 (four) times daily as needed for mouth pain., Disp: 480 mL, Rfl: 1 .  metoprolol succinate (TOPROL-XL) 50 MG 24 hr tablet, Take  1 tablet by mouth 1 day or 1 dose., Disp: , Rfl:  .  [START ON 03/21/2019] neomycin (MYCIFRADIN) 500 MG tablet, 1 tablet., Disp: , Rfl:  .  nystatin (MYCOSTATIN) 100000 UNIT/ML suspension, TAKE 1 TEASPOONSFUL BY MOUTH 4 TIMES DAILY, Disp: 120 mL, Rfl: 1 .  OLANZapine (ZYPREXA) 10 MG tablet, Take 1 tablet (10 mg total) by mouth at bedtime., Disp: 30 tablet, Rfl: 0 .  oxyCODONE (OXY IR/ROXICODONE) 5 MG immediate release tablet, Take 1 tablet (5 mg total) by mouth every 6 (six) hours as needed for severe pain., Disp: 60 tablet, Rfl: 0 .  pantoprazole (PROTONIX) 40 MG tablet, Take 1 tablet (40 mg total) by mouth 2 (two) times daily before a meal., Disp: 60 tablet, Rfl: 0 .  potassium chloride 20 MEQ/15ML (10%) SOLN, Take 15 mLs (20 mEq total) by mouth daily., Disp: 315 mL, Rfl: 0 .  rivaroxaban (XARELTO) 20 MG TABS tablet, Take 1 tablet (20 mg total) by mouth daily with supper., Disp: 30 tablet, Rfl: 3 .  sucralfate (CARAFATE) 1 g tablet, Take 1 tablet (1 g total) by mouth 2 (two) times daily. Dissolve in 3-4 tbsp warm water, swish and swallow, Disp: 60 tablet, Rfl: 2 .  traZODone (DESYREL) 50 MG tablet, Take 50 mg by mouth at bedtime as needed for sleep. , Disp: , Rfl:  .  [START ON 03/21/2019] metroNIDAZOLE (FLAGYL) 500 MG tablet, Take 1 tablet by mouth., Disp: , Rfl:  .  ondansetron (ZOFRAN) 8 MG tablet, Take 1 tablet by mouth as needed., Disp: , Rfl:  No current facility-administered medications for this visit.  Facility-Administered Medications Ordered in Other Visits:  .  heparin lock flush 100 unit/mL, 500 Units, Intravenous, Once, Sindy Guadeloupe, MD .  sodium chloride flush (NS) 0.9 % injection 10 mL, 10 mL, Intravenous, Once, Sindy Guadeloupe, MD .  sodium chloride flush (NS) 0.9 % injection 10 mL, 10 mL, Intravenous, Once, Sindy Guadeloupe, MD .  sodium chloride flush (NS) 0.9 % injection 10 mL, 10 mL, Intravenous, PRN, Sindy Guadeloupe, MD  No results found.  No images are attached to the  encounter.   CMP Latest Ref Rng & Units 03/11/2019  Glucose 70 - 99 mg/dL 120(H)  BUN 8 - 23 mg/dL 15  Creatinine 0.61 - 1.24 mg/dL 1.01  Sodium 135 - 145 mmol/L 138  Potassium 3.5 - 5.1 mmol/L 3.7  Chloride 98 - 111 mmol/L 105  CO2 22 - 32 mmol/L 24  Calcium 8.9 - 10.3 mg/dL 8.7(L)  Total Protein 6.5 - 8.1 g/dL 6.6  Total Bilirubin 0.3 - 1.2 mg/dL 0.4  Alkaline Phos 38 - 126 U/L 55  AST 15 - 41 U/L 18  ALT 0 - 44 U/L 15   CBC Latest Ref Rng & Units 03/11/2019  WBC 4.0 - 10.5 K/uL 2.4(L)  Hemoglobin 13.0 - 17.0 g/dL 10.8(L)  Hematocrit 39.0 - 52.0 % 33.7(L)  Platelets 150 - 400 K/uL 152     Observation/objective: Appears in no acute distress on video visit today.  Breathing is nonlabored  Assessment and plan: Patient is a 78 year old male with stage III rectal adenocarcinoma s/p total neoadjuvant treatment after completing 4 months of FOLFOX chemotherapy followed by concurrent chemoradiation which she completed last week of November 2020.  This is a routine follow-up visit  1.  Patient continues to be leukopenic/neutropenic about 1 month since his chemotherapy.  Suspect neutropenia secondary to chemo induced prolonged myelosuppression.  He has a tentative surgery scheduled on 03/23/2019.  I would therefore like him to get 2 or 3 doses of Neupogen if we can get his neutropenia improved prior to surgery.  I would therefore want him to get Neupogen or bio similar equivalent on 1/4 and 03/16/2019 and if his [place] remains less than 2 he will receive third dose on 03/17/2019.  2.  I will also repeat his CT chest abdomen pelvis with contrast which will be scheduled on 03/16/2019.  3.  Patient will get his port out at the time of surgery.  4.  Chemo-induced thrombocytopenia: Has resolved.  Continue to monitor  5.  I will tentatively see him back in 6 weeks time after surgery to discuss final pathology and further surveillance recommendations  Follow-up instructions: As above  I discussed the  assessment and treatment plan with the patient. The patient was provided an opportunity to ask questions and all were answered. The patient agreed with the plan and demonstrated an understanding of the instructions.   The patient was advised to call back or seek an in-person evaluation if the symptoms worsen or if the condition fails to improve as anticipated.   Visit Diagnosis: 1. 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[place])   2. Chemotherapy induced neutropenia ([facility])     Dr. Randa Evens, MD, MPH [facility] at [facility] [phone] 03/11/2019 3:06 PM

## 2019-03-11 NOTE — Telephone Encounter (Signed)
Called pt to let him know that due to the new insurance starting on 1/1. Our office will need to check benefits and because we will not get the approval that fast so we will cancel Monday and I will give him update. He is agreeable to this plan

## 2019-03-15 ENCOUNTER — Inpatient Hospital Stay: Payer: Medicare PPO

## 2019-03-16 ENCOUNTER — Inpatient Hospital Stay: Payer: Medicare PPO

## 2019-03-16 ENCOUNTER — Ambulatory Visit: Payer: Medicare PPO

## 2019-03-16 ENCOUNTER — Telehealth: Payer: Self-pay | Admitting: *Deleted

## 2019-03-16 NOTE — Telephone Encounter (Signed)
Called mr Bowmer to let him know that dr Janese Banks got a message from dr Sheryn Bison through our navigator here and dr Sheryn Bison sent message back that atb to protect pt is not good idea before surgery. The bowel is a dirty area and she would like his wbc count be a good number to go to surgery. It increase risk of infection and we may need to postpone the surgery. I told this to pt. And he has appt for preop tom at 11 am. Wants to know if he should go or not. I will check with Kristi and let them know that about the preop visit or not. I called Kristi and she said that pt needs to go to preop appt. Dr. Sheryn Bison wants to check with his labs and talk to the team that will do her surgery and determine if she feels good about staying with the 1/12 surgery date or does it need to be rescheduled. Also if he stays on the 1/12 date for surgery then it interferes with NPO status for scan. Pt . Will call me tomorrow after his appt with preop.

## 2019-03-17 ENCOUNTER — Telehealth: Payer: Self-pay | Admitting: *Deleted

## 2019-03-17 ENCOUNTER — Inpatient Hospital Stay: Payer: Medicare PPO

## 2019-03-17 ENCOUNTER — Encounter: Payer: Self-pay | Admitting: *Deleted

## 2019-03-17 NOTE — Telephone Encounter (Signed)
Called pt after he left me a message to call him. The pt go rescheduled to 3/3. The reason he was given was to increase wbc count for surgery. Also he wanted to know about whether he still needs to have ct on 03/22/2019. I will speak to Janese Banks and let him know tom.

## 2019-03-18 ENCOUNTER — Telehealth: Payer: Self-pay | Admitting: *Deleted

## 2019-03-18 NOTE — Telephone Encounter (Signed)
Called pt and told him that dr Janese Banks still wants him to go and have ct scan on 1/11. We will check his counts and if they go up we will get in touch with dr Sheryn Bison office to see if surgery can be moved up. Pt agreeable

## 2019-03-19 ENCOUNTER — Inpatient Hospital Stay: Payer: Medicare PPO

## 2019-03-19 ENCOUNTER — Inpatient Hospital Stay: Payer: Medicare PPO | Admitting: Oncology

## 2019-03-22 ENCOUNTER — Ambulatory Visit
Admission: RE | Admit: 2019-03-22 | Discharge: 2019-03-22 | Disposition: A | Discharge status: Home / Self Care | Payer: Medicare PPO | Source: Ambulatory Visit | Attending: Oncology | Admitting: Oncology

## 2019-03-22 ENCOUNTER — Other Ambulatory Visit: Payer: Self-pay

## 2019-03-22 DIAGNOSIS — C20 Malignant neoplasm of rectum: Secondary | ICD-10-CM | POA: Insufficient documentation

## 2019-03-22 MED ORDER — IOHEXOL 300 MG/ML  SOLN
85.0000 mL | Freq: Once | INTRAMUSCULAR | Status: AC | PRN
  Administered 2019-03-22: 85 mL via INTRAVENOUS

## 2019-03-23 ENCOUNTER — Inpatient Hospital Stay: Payer: Medicare PPO

## 2019-03-23 ENCOUNTER — Telehealth: Payer: Self-pay | Admitting: *Deleted

## 2019-03-23 DIAGNOSIS — D701 Agranulocytosis secondary to cancer chemotherapy: Secondary | ICD-10-CM

## 2019-03-23 NOTE — Telephone Encounter (Signed)
Pt called to ask if he can have covid vaccine. Dr. Janese Banks states that he can. She feels that the benefit of getting vaccination outweighs the poss. Side effects. Also we did call Dr. Sheryn Bison office and ask about if we got your wbc count to get better could she move up the surgery. She is booked and can't move the surgery date. Dr. Janese Banks wants pt to get labs checked and make sure the counts are coming up. IF not then when it gets closer to the surgey date we will check again and then if we need to do injections to help system we will. Pt is agreeable to the plan and will come in and get labs checked 03/29/2019 at 10:15.

## 2019-03-26 ENCOUNTER — Other Ambulatory Visit: Payer: Self-pay

## 2019-03-26 ENCOUNTER — Inpatient Hospital Stay: Payer: Medicare PPO

## 2019-03-26 ENCOUNTER — Inpatient Hospital Stay: Payer: Medicare PPO | Admitting: Oncology

## 2019-03-29 ENCOUNTER — Other Ambulatory Visit: Payer: Self-pay

## 2019-03-29 ENCOUNTER — Inpatient Hospital Stay: Payer: Medicare PPO | Attending: Oncology

## 2019-03-29 DIAGNOSIS — D701 Agranulocytosis secondary to cancer chemotherapy: Secondary | ICD-10-CM

## 2019-03-29 DIAGNOSIS — C20 Malignant neoplasm of rectum: Secondary | ICD-10-CM | POA: Insufficient documentation

## 2019-03-29 DIAGNOSIS — Z9221 Personal history of antineoplastic chemotherapy: Secondary | ICD-10-CM | POA: Diagnosis not present

## 2019-03-29 LAB — CBC WITH DIFFERENTIAL/PLATELET
Abs Immature Granulocytes: 0.01 10*3/uL (ref 0.00–0.07)
Basophils Absolute: 0 10*3/uL (ref 0.0–0.1)
Basophils Relative: 2 %
Eosinophils Absolute: 0.2 10*3/uL (ref 0.0–0.5)
Eosinophils Relative: 9 %
HCT: 35.4 % — ABNORMAL LOW (ref 39.0–52.0)
Hemoglobin: 11.1 g/dL — ABNORMAL LOW (ref 13.0–17.0)
Immature Granulocytes: 0 %
Lymphocytes Relative: 26 %
Lymphs Abs: 0.6 10*3/uL — ABNORMAL LOW (ref 0.7–4.0)
MCH: 29.9 pg (ref 26.0–34.0)
MCHC: 31.4 g/dL (ref 30.0–36.0)
MCV: 95.4 fL (ref 80.0–100.0)
Monocytes Absolute: 0.3 10*3/uL (ref 0.1–1.0)
Monocytes Relative: 13 %
Neutro Abs: 1.2 10*3/uL — ABNORMAL LOW (ref 1.7–7.7)
Neutrophils Relative %: 50 %
Platelets: 152 10*3/uL (ref 150–400)
RBC: 3.71 MIL/uL — ABNORMAL LOW (ref 4.22–5.81)
RDW: 13.2 % (ref 11.5–15.5)
WBC: 2.4 10*3/uL — ABNORMAL LOW (ref 4.0–10.5)
nRBC: 0 % (ref 0.0–0.2)

## 2019-03-30 ENCOUNTER — Inpatient Hospital Stay: Payer: Medicare PPO

## 2019-04-02 ENCOUNTER — Inpatient Hospital Stay: Payer: Medicare PPO | Admitting: Oncology

## 2019-04-02 ENCOUNTER — Encounter: Payer: Self-pay | Admitting: Radiation Oncology

## 2019-04-02 ENCOUNTER — Inpatient Hospital Stay: Payer: Medicare PPO

## 2019-04-02 ENCOUNTER — Other Ambulatory Visit: Payer: Self-pay

## 2019-04-02 NOTE — Progress Notes (Signed)
Patient pre screened for office appointment, no questions or concerns today. Patient reminded of upcoming appointment time and date. 

## 2019-04-05 ENCOUNTER — Ambulatory Visit
Admission: RE | Admit: 2019-04-05 | Discharge: 2019-04-05 | Disposition: A | Discharge status: Home / Self Care | Payer: Medicare PPO | Source: Ambulatory Visit | Attending: Radiation Oncology | Admitting: Radiation Oncology

## 2019-04-05 ENCOUNTER — Other Ambulatory Visit: Payer: Self-pay

## 2019-04-05 VITALS — BP 139/78 | HR 73 | Temp 94.8°F | Resp 16 | Wt 150.1 lb

## 2019-04-05 DIAGNOSIS — Z923 Personal history of irradiation: Secondary | ICD-10-CM | POA: Insufficient documentation

## 2019-04-05 DIAGNOSIS — Z9221 Personal history of antineoplastic chemotherapy: Secondary | ICD-10-CM | POA: Diagnosis not present

## 2019-04-05 DIAGNOSIS — C20 Malignant neoplasm of rectum: Secondary | ICD-10-CM | POA: Diagnosis present

## 2019-04-05 DIAGNOSIS — D701 Agranulocytosis secondary to cancer chemotherapy: Secondary | ICD-10-CM | POA: Diagnosis not present

## 2019-04-05 NOTE — Progress Notes (Signed)
Radiation Oncology Follow up Note  Name: Willie [name]   Date:   04/05/2019 MRN:  [id number] DOB: 1940/11/29    This 79 y.o. male presents to the clinic today for 1 month follow-up status post concurrent chemoradiation therapy in a neoadjuvant fashion for stage IIIb (T3 N2 M0) adenocarcinoma the rectum.  REFERRING PROVIDER: [name], MD  HPI: Patient is an 79 year old male now out 1 month having completed neoadjuvant chemoradiation for stage IIIb adenocarcinoma the rectum.  He is seen today in routine follow-up is doing fairly well..  He specifically [place] blood per rectum or significant pain on defecation.  He had a recent CT scan showing indistinct margins in the upper rectal tumor mildly reduced size of surrounding lymph nodes.  Patient has undergone total neoadjuvant chemotherapy including FOLFOX as well as concurrent chemoradiation.  He is seen surgery although his surgery has been postponed secondary to neutropenia secondary to chemo induced myelosuppression.  COMPLICATIONS OF TREATMENT: none  FOLLOW UP COMPLIANCE: keeps appointments   PHYSICAL EXAM:  BP 139/78   Pulse 73   Temp (!) 94.8 F (34.9 C)   Resp 16   Wt 150 lb 1.6 oz (68.1 kg)   SpO2 98%   BMI 25.76 kg/m  Well-developed well-nourished patient in NAD. HEENT reveals PERLA, EOMI, discs not visualized.  Oral cavity is clear. No oral mucosal lesions are identified. Neck is clear without evidence of cervical or supraclavicular adenopathy. Lungs are clear to A&P. Cardiac examination is essentially unremarkable with regular rate and rhythm without murmur rub or thrill. Abdomen is benign with no organomegaly or masses noted. Motor sensory and DTR levels are equal and symmetric in the upper and lower extremities. Cranial nerves II through XII are grossly intact. Proprioception is intact. No peripheral adenopathy or edema is identified. No motor or sensory levels are noted. Crude visual fields are within normal  range.  RADIOLOGY RESULTS: CT scan reviewed compatible with above-stated findings  PLAN: Present time patient is doing fairly well he is scheduled now for his surgery in March I will see him about a month after in follow-up.  He otherwise continues close follow-up care by medical oncology.  Patient knows to call with any concerns.  I would like to take this opportunity to thank you for allowing me to participate in the care of your patient.Noreene Filbert, MD

## 2019-04-06 ENCOUNTER — Inpatient Hospital Stay: Payer: Medicare PPO

## 2019-04-07 ENCOUNTER — Telehealth: Payer: Self-pay | Admitting: *Deleted

## 2019-04-07 NOTE — Telephone Encounter (Signed)
md wanted to have labs done every 2 weeks and I was calling to make new appt. He already has an appt 2/4 so we will just check labs on that day and he is agreeable to this.

## 2019-04-08 ENCOUNTER — Ambulatory Visit: Payer: Medicare PPO | Attending: Internal Medicine

## 2019-04-08 DIAGNOSIS — Z20822 Contact with and (suspected) exposure to covid-19: Secondary | ICD-10-CM

## 2019-04-09 ENCOUNTER — Inpatient Hospital Stay: Payer: Medicare PPO | Admitting: Oncology

## 2019-04-09 ENCOUNTER — Inpatient Hospital Stay: Payer: Medicare PPO

## 2019-04-09 LAB — NOVEL CORONAVIRUS, NAA: SARS-CoV-2, NAA: NOT DETECTED

## 2019-04-12 ENCOUNTER — Telehealth: Payer: Self-pay

## 2019-04-12 NOTE — Telephone Encounter (Signed)
Pt. Given COVID 19 results, verbalizes understanding. 

## 2019-04-13 ENCOUNTER — Inpatient Hospital Stay: Payer: Medicare PPO

## 2019-04-14 ENCOUNTER — Inpatient Hospital Stay: Payer: Medicare PPO | Attending: Oncology

## 2019-04-14 ENCOUNTER — Other Ambulatory Visit: Payer: Self-pay

## 2019-04-14 DIAGNOSIS — C20 Malignant neoplasm of rectum: Secondary | ICD-10-CM

## 2019-04-14 DIAGNOSIS — C19 Malignant neoplasm of rectosigmoid junction: Secondary | ICD-10-CM | POA: Insufficient documentation

## 2019-04-14 DIAGNOSIS — R5381 Other malaise: Secondary | ICD-10-CM | POA: Insufficient documentation

## 2019-04-14 DIAGNOSIS — R5383 Other fatigue: Secondary | ICD-10-CM | POA: Diagnosis not present

## 2019-04-14 DIAGNOSIS — I2510 Atherosclerotic heart disease of native coronary artery without angina pectoris: Secondary | ICD-10-CM | POA: Diagnosis not present

## 2019-04-14 DIAGNOSIS — Z452 Encounter for adjustment and management of vascular access device: Secondary | ICD-10-CM | POA: Insufficient documentation

## 2019-04-14 DIAGNOSIS — I10 Essential (primary) hypertension: Secondary | ICD-10-CM | POA: Insufficient documentation

## 2019-04-14 DIAGNOSIS — Z79899 Other long term (current) drug therapy: Secondary | ICD-10-CM | POA: Diagnosis not present

## 2019-04-14 DIAGNOSIS — E119 Type 2 diabetes mellitus without complications: Secondary | ICD-10-CM | POA: Insufficient documentation

## 2019-04-14 DIAGNOSIS — Z8546 Personal history of malignant neoplasm of prostate: Secondary | ICD-10-CM | POA: Diagnosis not present

## 2019-04-14 DIAGNOSIS — T451X5A Adverse effect of antineoplastic and immunosuppressive drugs, initial encounter: Secondary | ICD-10-CM | POA: Insufficient documentation

## 2019-04-14 DIAGNOSIS — E785 Hyperlipidemia, unspecified: Secondary | ICD-10-CM | POA: Insufficient documentation

## 2019-04-14 DIAGNOSIS — D701 Agranulocytosis secondary to cancer chemotherapy: Secondary | ICD-10-CM | POA: Insufficient documentation

## 2019-04-14 LAB — CBC WITH DIFFERENTIAL/PLATELET
Abs Immature Granulocytes: 0.01 10*3/uL (ref 0.00–0.07)
Basophils Absolute: 0 10*3/uL (ref 0.0–0.1)
Basophils Relative: 1 %
Eosinophils Absolute: 0.2 10*3/uL (ref 0.0–0.5)
Eosinophils Relative: 6 %
HCT: 37.3 % — ABNORMAL LOW (ref 39.0–52.0)
Hemoglobin: 12 g/dL — ABNORMAL LOW (ref 13.0–17.0)
Immature Granulocytes: 0 %
Lymphocytes Relative: 25 %
Lymphs Abs: 0.7 10*3/uL (ref 0.7–4.0)
MCH: 29.9 pg (ref 26.0–34.0)
MCHC: 32.2 g/dL (ref 30.0–36.0)
MCV: 92.8 fL (ref 80.0–100.0)
Monocytes Absolute: 0.3 10*3/uL (ref 0.1–1.0)
Monocytes Relative: 10 %
Neutro Abs: 1.5 10*3/uL — ABNORMAL LOW (ref 1.7–7.7)
Neutrophils Relative %: 58 %
Platelets: 166 10*3/uL (ref 150–400)
RBC: 4.02 MIL/uL — ABNORMAL LOW (ref 4.22–5.81)
RDW: 13.2 % (ref 11.5–15.5)
WBC: 2.6 10*3/uL — ABNORMAL LOW (ref 4.0–10.5)
nRBC: 0 % (ref 0.0–0.2)

## 2019-04-14 LAB — COMPREHENSIVE METABOLIC PANEL
ALT: 21 U/L (ref 0–44)
AST: 20 U/L (ref 15–41)
Albumin: 3.8 g/dL (ref 3.5–5.0)
Alkaline Phosphatase: 63 U/L (ref 38–126)
Anion gap: 9 (ref 5–15)
BUN: 20 mg/dL (ref 8–23)
CO2: 24 mmol/L (ref 22–32)
Calcium: 9.1 mg/dL (ref 8.9–10.3)
Chloride: 103 mmol/L (ref 98–111)
Creatinine, Ser: 1.13 mg/dL (ref 0.61–1.24)
GFR calc Af Amer: >60 mL/min (ref >60)
GFR calc non Af Amer: >60 mL/min (ref >60)
Glucose, Bld: 131 mg/dL — ABNORMAL HIGH (ref 70–99)
Potassium: 3.8 mmol/L (ref 3.5–5.1)
Sodium: 136 mmol/L (ref 135–145)
Total Bilirubin: 0.5 mg/dL (ref 0.3–1.2)
Total Protein: 6.9 g/dL (ref 6.5–8.1)

## 2019-04-15 ENCOUNTER — Inpatient Hospital Stay (HOSPITAL_BASED_OUTPATIENT_CLINIC_OR_DEPARTMENT_OTHER): Payer: Medicare PPO | Admitting: Oncology

## 2019-04-15 ENCOUNTER — Encounter: Payer: Self-pay | Admitting: Oncology

## 2019-04-15 ENCOUNTER — Other Ambulatory Visit: Payer: Medicare Other

## 2019-04-15 ENCOUNTER — Inpatient Hospital Stay: Payer: Medicare PPO | Admitting: Oncology

## 2019-04-15 ENCOUNTER — Other Ambulatory Visit: Payer: Self-pay

## 2019-04-15 DIAGNOSIS — C20 Malignant neoplasm of rectum: Secondary | ICD-10-CM | POA: Diagnosis not present

## 2019-04-15 DIAGNOSIS — D701 Agranulocytosis secondary to cancer chemotherapy: Secondary | ICD-10-CM | POA: Diagnosis not present

## 2019-04-15 DIAGNOSIS — T451X5A Adverse effect of antineoplastic and immunosuppressive drugs, initial encounter: Secondary | ICD-10-CM

## 2019-04-15 LAB — CEA: CEA: 2.1 ng/mL (ref 0.0–4.7)

## 2019-04-15 MED ORDER — OXYCODONE HCL 5 MG PO TABS: 5.0000 mg | ORAL_TABLET | Freq: Four times a day (QID) | ORAL | 0 refills | Status: DC | PRN

## 2019-04-15 NOTE — Progress Notes (Signed)
Patient would like to know if his mass had decreased. Patient would like a refill on his Oxycodone 5 MG.

## 2019-04-16 ENCOUNTER — Inpatient Hospital Stay: Payer: Medicare PPO | Admitting: Oncology

## 2019-04-16 ENCOUNTER — Inpatient Hospital Stay: Payer: Medicare PPO

## 2019-04-16 NOTE — Progress Notes (Signed)
I connected with Willie [name] on 04/16/19 at  9:00 AM EST by video enabled telemedicine visit and verified that I am speaking with the correct person using two identifiers.   I discussed the limitations, risks, security and privacy concerns of performing an evaluation and management service by telemedicine and the availability of in-person appointments. I also discussed with the patient that there may be a patient responsible charge related to this service. The patient expressed understanding and agreed to proceed.  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 He was recently found to have iron deficiency anemia which led to a colonoscopy On 07/15/2018. Colonoscopy showed a villous partially obstructing medium-sized mass in the mid sigmoid colon. The mass was partially circumferential measuring 5 cm. There was another infiltrative partially obstructing medium-sized mass found in the rectosigmoid colon which also measured 5 cm. Pathology from the sigmoid colon mass showed high-grade dysplasia at least involving an adenomatous lesion with villous features. A more serious process is not excluded. Rectosigmoid mass biopsy showed invasive colorectal adenocarcinoma.  Patient was referred to Dr. [name] for surgical management. Patient had a CT abdomen and pelvis with contrast which showed irregular asymmetric mural thickening of the proximal rectum measuring over 6 to 7 cm. Potential local nodal metastases with several subcentimeter iliac  lymph nodes and presacral lymph nodes. No evidence of metastatic disease outside the pelvis.  Patient also had MRI of the pelvis with and without contrast which showed tumor length 8.5 cm with extension through muscularis propria decreased C. There is diffuse involvement of muscularis propria. No extramural vascular invasion or tumor thrombus. Shortest distance of any tumor/note from mesorectal fascia 1 to 2 mm. No involvement of adjacent organs or pelvic sidewall. Mesorectal lymph nodes within perirectal fat including 6 mm node and a low sigmoid mesocolon node measuring 6 mm. Another 6 mm node within the low sigmoid mesocolon representing extremity rectal lymphadenopathy. T3N2 by MRI. Distance from tumor to internal anal sphincter is 6.2 cm.  Plan is to proceed with total neoadjuvant chemotherapy followed by definitive surgery.Patient completed 8 cycles of neoadjuvant FOLFOX chemotherapy on 12/07/2018. Interim scans showed similar-appearing irregular wall thickening of the rectum but no retroperitoneal pelvic adenopathy.Patient completed concurrent chemoradiation end of November 2020.    Interval history: He is doing well overall.  Denies any nausea.  Appetite has been good and he denies any difficulty swallowing from his prior esophagitis.  Still reports some minimal rectal pain but denies other complaints   Review of Systems  Constitutional: Positive for malaise/fatigue. Negative for chills, fever and weight loss.  HENT: Negative for congestion, ear discharge and nosebleeds.   Eyes: Negative for blurred vision.  Respiratory: Negative for cough, hemoptysis, sputum production, shortness of breath and wheezing.   Cardiovascular: Negative for chest pain, palpitations, orthopnea and claudication.  Gastrointestinal: Negative for abdominal pain, blood in stool, constipation, diarrhea, heartburn, melena, nausea and vomiting.  Genitourinary: Negative for dysuria, flank pain, frequency,  hematuria and urgency.  Musculoskeletal: Negative for back pain, joint pain and myalgias.  Skin: Negative for rash.  Neurological: Negative for dizziness, tingling, focal weakness, seizures, weakness  and headaches.  Endo/Heme/Allergies: Does not bruise/bleed easily.  Psychiatric/Behavioral: Negative for depression and suicidal ideas. The patient does not have insomnia.     No Known Allergies  Past Medical History:  Diagnosis Date  . Anemia   . Coronary artery disease   . Diabetes mellitus without complication ([place])   . History of hiatal hernia   . Hypercholesterolemia   . Hypertension   . Prostate cancer ([employer]) 2000   Prostatectomy.   . 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[place]) 09/2018   Chemo tx's.     Past Surgical History:  Procedure Laterality Date  . COLONOSCOPY WITH PROPOFOL N/A 07/15/2018   Procedure: COLONOSCOPY WITH PROPOFOL;  Surgeon: [name], [name], MD;  Location: [facility] ENDOSCOPY;  Service: Gastroenterology;  Laterality: N/A;  . ESOPHAGOGASTRODUODENOSCOPY N/A 10/12/2018   Procedure: ESOPHAGOGASTRODUODENOSCOPY (EGD);  Surgeon: [name], MD;  Location[facility] ENDOSCOPY;  Service: Gastroenterology;  Laterality: N/A;  . ESOPHAGOGASTRODUODENOSCOPY (EGD) WITH PROPOFOL N/A 07/15/2018   Procedure: ESOPHAGOGASTRODUODENOSCOPY (EGD) WITH PROPOFOL;  Surgeon: [name], [name], MD;  Location: [facility] ENDOSCOPY;  Service: Gastroenterology;  Laterality: N/A;  . PORTACATH PLACEMENT N/A 08/13/2018   Procedure: INSERTION PORT-A-CATH;  Surgeon: [name], MD;  Location: [facility] ORS;  Service: General;  Laterality: N/A;  . PROSTATE SURGERY      Social History   Socioeconomic History  . Marital status: Widowed    Spouse name: Not on file  . Number of children: 0  . Years of education: Not on file  . Highest education level: Not on file  Occupational History  . Occupation: Pharmacist, hospital    Comment: Retired  Tobacco Use  . Smoking status: Never Smoker  . Smokeless tobacco: Never Used  Substance  and Sexual Activity  . Alcohol use: Not Currently  . Drug use: Never  . Sexual activity: Not Currently  Other Topics Concern  . 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  His wife was a Camera operator and they traveled frequently.  Several nieces and nephews.  He is the youngest of his brothers and sisters and only surviving.   Social Determinants of Health   Financial Resource Strain:   . Difficulty of Paying Living Expenses: Not on file  Food Insecurity:   . Worried About Charity fundraiser in the Last Year: Not on file  . Ran Out of Food in the Last Year: Not on file  Transportation Needs:   . Lack of Transportation (Medical): Not on file  . Lack of Transportation (Non-Medical): Not on file  Physical Activity:   . Days of Exercise per Week: Not on file  . Minutes of Exercise per Session: Not on file  Stress:   . Feeling of Stress : Not on file  Social Connections: Unknown  . Frequency of Communication with Friends and Family: More than three times a week  . Frequency of Social Gatherings with Friends and Family: More than three times a week  . Attends Religious Services: Not on file  . Active Member of Clubs or Organizations: Not on file  . Attends Archivist Meetings: Not on file  . Marital Status: Not on file  Intimate Partner Violence: Not At Risk  . Fear of Current or Ex-Partner: No  . Emotionally Abused: No  . Physically Abused: No  . Sexually Abused: No    History reviewed. No pertinent family history.  Current Outpatient Medications:  .  ezetimibe (ZETIA) 10 MG tablet, Take 10 mg by mouth every morning. , Disp: , Rfl:  .  HUMALOG MIX 75/25 KWIKPEN (75-25) 100 UNIT/ML Kwikpen, Inject 4 Units into the skin every morning., Disp: 15 mL, Rfl: 11 .  magic mouthwash w/lidocaine SOLN, Take 5 mLs by mouth 4 (four) times daily  as needed for mouth pain., Disp: 480 mL, Rfl: 1 .  metoprolol succinate (TOPROL-XL) 50 MG 24 hr tablet, Take 1 tablet by mouth 1 day or 1 dose., Disp: , Rfl:  .  pantoprazole (PROTONIX) 40 MG tablet, Take 1 tablet (40 mg total) by mouth 2 (two) times daily before a meal. (Patient taking differently: Take 40 mg by mouth daily. ), Disp: 60 tablet, Rfl: 0 .  traZODone (DESYREL) 50 MG tablet, Take 50 mg by mouth at bedtime as needed for sleep. , Disp: , Rfl:  .  oxyCODONE (OXY IR/ROXICODONE) 5 MG immediate release tablet, Take 1 tablet (5 mg total) by mouth every 6 (six) hours as needed for severe pain., Disp: 60 tablet, Rfl: 0 No current facility-administered medications for this visit.  Facility-Administered Medications Ordered in Other Visits:  .  heparin lock flush 100 unit/mL, 500 Units, Intravenous, Once, Sindy Guadeloupe, MD .  sodium chloride flush (NS) 0.9 % injection 10 mL, 10 mL, Intravenous, Once, Sindy Guadeloupe, MD .  sodium chloride flush (NS) 0.9 % injection 10 mL, 10 mL, Intravenous, Once, Sindy Guadeloupe, MD .  sodium chloride flush (NS) 0.9 % injection 10 mL, 10 mL, Intravenous, PRN, Sindy Guadeloupe, MD  CT Chest W Contrast  Result Date: 03/22/2019 CLINICAL DATA:  Invasive rectosigmoid colorectal adenocarcinoma, villous features in a separate sigmoid colon mass. Status post neoadjuvant chemotherapy, concurrent chemo radiation completed November 2020. EXAM: CT CHEST, ABDOMEN, AND PELVIS WITH CONTRAST TECHNIQUE: Multidetector CT imaging of the chest, abdomen and pelvis was performed following the standard protocol during bolus administration of intravenous contrast. CONTRAST:  9mL OMNIPAQUE IOHEXOL 300 MG/ML  SOLN COMPARISON:  Multiple exams, including 12/14/2018 FINDINGS: CT CHEST FINDINGS Cardiovascular: Port-A-Cath noted. Mild reduction in size of the small filling defect around the proximal aspect of the jugular portion of the catheter on images 2 through 4 of series 2, likely  representing small amount of fibrin sheath or pericatheter chronic thrombus. Atherosclerotic calcification of the left anterior descending coronary artery and of the descending thoracic aorta. Mediastinum/Nodes: Unremarkable Lungs/Pleura: Stable 2 mm right upper lobe pulmonary nodule on image 55/3. Stable 2 mm right middle lobe nodule on image 80/3. Musculoskeletal: Multilevel bridging thoracic spondylosis. CT ABDOMEN PELVIS FINDINGS Hepatobiliary: Previous subtle hypodense lesion along the dome of the left hepatic lobe is not well seen today. No specific lesion identified. Contracted gallbladder. Pancreas: Unremarkable Spleen: Unremarkable Adrenals/Urinary Tract: Adrenal glands normal. Fluid density 1.8 by 1.5 cm right kidney lower pole cystic lesion may have an internal septation but no definite internal solid enhancing components, and no change from earliest available comparison of 07/23/2018. A Bosniak classification cannot be assigned due to the lack of precontrast images, but benign etiology favored. A part of the urinary bladder extends into the indirect right inguinal hernia. Stomach/Bowel: Widespread colonic diverticulosis most concentrated in the descending and proximal sigmoid colon. Wall thickening in indistinct colon wall at the rectosigmoid junction for example images 100-102 of series 2. A perirectal/presacral lymph node measures 0.4 cm in short axis on image 91/2, previously 0.6 cm. Other perirectal lymph nodes likewise appear slightly smaller. Regional  perirectal/presacral edema likely represents response to therapy. Vascular/Lymphatic: Aortoiliac atherosclerotic vascular disease. Small perirectal lymph nodes are mildly reduced in size compared to previous. Reproductive: Prostatectomy. Other: No supplemental non-categorized findings. Musculoskeletal: Degenerative arthropathy of both hips including degenerative subcortical cystic lesions. Spurring of the sacroiliac joints anteriorly. Grade 1  degenerative anterolisthesis at L4-5 with suspected impingement at the L4-5 level. As noted above, there is an indirect right inguinal hernia containing portion of the urinary bladder. IMPRESSION: 1. Mildly indistinct margins of the upper rectal tumor, with mildly reduced size of surrounding lymph nodes. Mild perirectal/presacral edema, likely a response to therapy. 2. Reduced size of small filling defect around the proximal aspect of the jugular portion of the Port-A-Cath on images 2 through 4 of series 2, likely a small amount of fibrin sheath or pericatheter chronic thrombus. 3. Other imaging findings of potential clinical significance: Coronary atherosclerosis. Stable small (2 mm in diameter) right upper lobe and right middle lobe pulmonary nodules. Prostatectomy. Right kidney lower pole cyst. A part of the urinary bladder extends into the indirect right inguinal hernia. Degenerative arthropathy of both hips. Grade 1 degenerative anterolisthesis at L4-5 with suspected impingement at L4-5 due to spondylosis and degenerative disc disease. Aortic Atherosclerosis (ICD10-I70.0). Electronically Signed   By: Van Clines M.D.   On: 03/22/2019 09:35   CT Abdomen Pelvis W Contrast  Result Date: 03/22/2019 CLINICAL DATA:  Invasive rectosigmoid colorectal adenocarcinoma, villous features in a separate sigmoid colon mass. Status post neoadjuvant chemotherapy, concurrent chemo radiation completed November 2020. EXAM: CT CHEST, ABDOMEN, AND PELVIS WITH CONTRAST TECHNIQUE: Multidetector CT imaging of the chest, abdomen and pelvis was performed following the standard protocol during bolus administration of intravenous contrast. CONTRAST:  66mL OMNIPAQUE IOHEXOL 300 MG/ML  SOLN COMPARISON:  Multiple exams, including 12/14/2018 FINDINGS: CT CHEST FINDINGS Cardiovascular: Port-A-Cath noted. Mild reduction in size of the small filling defect around the proximal aspect of the jugular portion of the catheter on images 2  through 4 of series 2, likely representing small amount of fibrin sheath or pericatheter chronic thrombus. Atherosclerotic calcification of the left anterior descending coronary artery and of the descending thoracic aorta. Mediastinum/Nodes: Unremarkable Lungs/Pleura: Stable 2 mm right upper lobe pulmonary nodule on image 55/3. Stable 2 mm right middle lobe nodule on image 80/3. Musculoskeletal: Multilevel bridging thoracic spondylosis. CT ABDOMEN PELVIS FINDINGS Hepatobiliary: Previous subtle hypodense lesion along the dome of the left hepatic lobe is not well seen today. No specific lesion identified. Contracted gallbladder. Pancreas: Unremarkable Spleen: Unremarkable Adrenals/Urinary Tract: Adrenal glands normal. Fluid density 1.8 by 1.5 cm right kidney lower pole cystic lesion may have an internal septation but no definite internal solid enhancing components, and no change from earliest available comparison of 07/23/2018. A Bosniak classification cannot be assigned due to the lack of precontrast images, but benign etiology favored. A part of the urinary bladder extends into the indirect right inguinal hernia. Stomach/Bowel: Widespread colonic diverticulosis most concentrated in the descending and proximal sigmoid colon. Wall thickening in indistinct colon wall at the rectosigmoid junction for example images 100-102 of series 2. A perirectal/presacral lymph node measures 0.4 cm in short axis on image 91/2, previously 0.6 cm. Other perirectal lymph nodes likewise appear slightly smaller. Regional perirectal/presacral edema likely represents response to therapy. Vascular/Lymphatic: Aortoiliac atherosclerotic vascular disease. Small perirectal lymph nodes are mildly reduced in size compared to previous. Reproductive: Prostatectomy. Other: No supplemental non-categorized findings. Musculoskeletal: Degenerative arthropathy of both hips including degenerative subcortical cystic lesions. Spurring of the sacroiliac  joints  anteriorly. Grade 1 degenerative anterolisthesis at L4-5 with suspected impingement at the L4-5 level. As noted above, there is an indirect right inguinal hernia containing portion of the urinary bladder. IMPRESSION: 1. Mildly indistinct margins of the upper rectal tumor, with mildly reduced size of surrounding lymph nodes. Mild perirectal/presacral edema, likely a response to therapy. 2. Reduced size of small filling defect around the proximal aspect of the jugular portion of the Port-A-Cath on images 2 through 4 of series 2, likely a small amount of fibrin sheath or pericatheter chronic thrombus. 3. Other imaging findings of potential clinical significance: Coronary atherosclerosis. Stable small (2 mm in diameter) right upper lobe and right middle lobe pulmonary nodules. Prostatectomy. Right kidney lower pole cyst. A part of the urinary bladder extends into the indirect right inguinal hernia. Degenerative arthropathy of both hips. Grade 1 degenerative anterolisthesis at L4-5 with suspected impingement at L4-5 due to spondylosis and degenerative disc disease. Aortic Atherosclerosis (ICD10-I70.0). Electronically Signed   By: Van Clines M.D.   On: 03/22/2019 09:35    No images are attached to the encounter.   CMP Latest Ref Rng & Units 04/14/2019  Glucose 70 - 99 mg/dL 131(H)  BUN 8 - 23 mg/dL 20  Creatinine 0.61 - 1.24 mg/dL 1.13  Sodium 135 - 145 mmol/L 136  Potassium 3.5 - 5.1 mmol/L 3.8  Chloride 98 - 111 mmol/L 103  CO2 22 - 32 mmol/L 24  Calcium 8.9 - 10.3 mg/dL 9.1  Total Protein 6.5 - 8.1 g/dL 6.9  Total Bilirubin 0.3 - 1.2 mg/dL 0.5  Alkaline Phos 38 - 126 U/L 63  AST 15 - 41 U/L 20  ALT 0 - 44 U/L 21   CBC Latest Ref Rng & Units 04/14/2019  WBC 4.0 - 10.5 K/uL 2.6(L)  Hemoglobin 13.0 - 17.0 g/dL 12.0(L)  Hematocrit 39.0 - 52.0 % 37.3(L)  Platelets 150 - 400 K/uL 166     Observation/objective: Appears in no acute distress of a video visit today.  Breathing is  nonlabored  Assessment and plan:Patient is a 79 year old male with stage III rectal adenocarcinoma s/p total neoadjuvant treatment after completing 4 months of FOLFOX chemotherapy followed by concurrent chemoradiation which she completed last week of November 2020.  This is a routine follow-up visit for ongoing chemo-induced neutropenia  On reviewing patient's CBC from October to present patient's hemoglobin is improved from 9-12.  Also his platelet counts have improved from 130s to 166.  He still has ongoing neutropenia but if he look at the trends after November 2020 his neutrophil counts have improved to 1.5 today and I suspect it will get even better in the upcoming weeks.  He still has 1 month to go for surgery.  I will plan to check his repeat CBC with differential on 05/03/2019.   Follow-up instructions: CBC with differential, CMP and CEA on 05/03/2019.  I will see him on 06/09/2019 with CBC with differential after his surgery to discuss further surveillance.  I discussed the assessment and treatment plan with the patient. The patient was provided an opportunity to ask questions and all were answered. The patient agreed with the plan and demonstrated an understanding of the instructions.   The patient was advised to call back or seek an in-person evaluation if the symptoms worsen or if the condition fails to improve as anticipated.   Visit Diagnosis: 1. 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[place])   2. Chemotherapy induced neutropenia ([facility])     Dr. Randa Evens, MD, MPH [place] at [facility]  [facility] Tel- [phone] 04/16/2019 12:33 PM

## 2019-04-20 ENCOUNTER — Inpatient Hospital Stay: Payer: Medicare PPO

## 2019-04-20 ENCOUNTER — Telehealth: Payer: Self-pay

## 2019-04-20 ENCOUNTER — Other Ambulatory Visit: Payer: Self-pay | Admitting: Oncology

## 2019-04-20 MED ORDER — GABAPENTIN 300 MG PO CAPS: 300.0000 mg | ORAL_CAPSULE | Freq: Three times a day (TID) | ORAL | 2 refills | Status: DC

## 2019-04-20 NOTE — Telephone Encounter (Signed)
I will send a prescription for cymbalta starting at 30 mg and increase to 60 mg after 1 week

## 2019-04-20 NOTE — Telephone Encounter (Signed)
Patient called wanting to know what he could take for neuropathy on his fingers and toes. Patient stated that he had been suffering from neuropathy for about two weeks. I told him that I would ask Dr. Janese Banks and then call him back.  Dr. Janese Banks, can you please advise.

## 2019-04-20 NOTE — Telephone Encounter (Signed)
Per Dr. Janese Banks she stated that she would send patient a prescription for his neuropathy sent to his pharmacy. Patient was notified of Dr. Elroy Channel recommendation. In the meantime, patient also asked if he could do acupuncture as recommended by his chiropractor.  I then asked Dr. Janese Banks on secure chat and she stated that he must definitely do acupuncture. Patient agreed with the treatment plan with his neuropathy and the fact that he could start doing acupuncture. Patient had no further questions.

## 2019-04-20 NOTE — Progress Notes (Signed)
cymbalta 

## 2019-04-23 ENCOUNTER — Inpatient Hospital Stay: Payer: Medicare PPO

## 2019-04-23 ENCOUNTER — Inpatient Hospital Stay: Payer: Medicare PPO | Admitting: Oncology

## 2019-04-27 ENCOUNTER — Inpatient Hospital Stay: Payer: Medicare PPO

## 2019-04-30 ENCOUNTER — Inpatient Hospital Stay: Payer: Medicare PPO

## 2019-04-30 ENCOUNTER — Inpatient Hospital Stay: Payer: Medicare PPO | Admitting: Oncology

## 2019-05-03 ENCOUNTER — Other Ambulatory Visit: Payer: Self-pay

## 2019-05-03 ENCOUNTER — Inpatient Hospital Stay: Payer: Medicare PPO

## 2019-05-03 DIAGNOSIS — C19 Malignant neoplasm of rectosigmoid junction: Secondary | ICD-10-CM | POA: Diagnosis not present

## 2019-05-03 DIAGNOSIS — Z95828 Presence of other vascular implants and grafts: Secondary | ICD-10-CM

## 2019-05-03 DIAGNOSIS — C20 Malignant neoplasm of rectum: Secondary | ICD-10-CM

## 2019-05-03 LAB — CBC WITH DIFFERENTIAL/PLATELET
Abs Immature Granulocytes: 0.02 10*3/uL (ref 0.00–0.07)
Basophils Absolute: 0 10*3/uL (ref 0.0–0.1)
Basophils Relative: 1 %
Eosinophils Absolute: 0.2 10*3/uL (ref 0.0–0.5)
Eosinophils Relative: 6 %
HCT: 33.6 % — ABNORMAL LOW (ref 39.0–52.0)
Hemoglobin: 10.7 g/dL — ABNORMAL LOW (ref 13.0–17.0)
Immature Granulocytes: 1 %
Lymphocytes Relative: 23 %
Lymphs Abs: 0.6 10*3/uL — ABNORMAL LOW (ref 0.7–4.0)
MCH: 29.6 pg (ref 26.0–34.0)
MCHC: 31.8 g/dL (ref 30.0–36.0)
MCV: 92.8 fL (ref 80.0–100.0)
Monocytes Absolute: 0.3 10*3/uL (ref 0.1–1.0)
Monocytes Relative: 11 %
Neutro Abs: 1.7 10*3/uL (ref 1.7–7.7)
Neutrophils Relative %: 58 %
Platelets: 155 10*3/uL (ref 150–400)
RBC: 3.62 MIL/uL — ABNORMAL LOW (ref 4.22–5.81)
RDW: 13.9 % (ref 11.5–15.5)
WBC: 2.9 10*3/uL — ABNORMAL LOW (ref 4.0–10.5)
nRBC: 0 % (ref 0.0–0.2)

## 2019-05-03 LAB — COMPREHENSIVE METABOLIC PANEL
ALT: 19 U/L (ref 0–44)
AST: 19 U/L (ref 15–41)
Albumin: 3.5 g/dL (ref 3.5–5.0)
Alkaline Phosphatase: 62 U/L (ref 38–126)
Anion gap: 7 (ref 5–15)
BUN: 21 mg/dL (ref 8–23)
CO2: 27 mmol/L (ref 22–32)
Calcium: 8.6 mg/dL — ABNORMAL LOW (ref 8.9–10.3)
Chloride: 103 mmol/L (ref 98–111)
Creatinine, Ser: 0.96 mg/dL (ref 0.61–1.24)
GFR calc Af Amer: >60 mL/min (ref >60)
GFR calc non Af Amer: >60 mL/min (ref >60)
Glucose, Bld: 216 mg/dL — ABNORMAL HIGH (ref 70–99)
Potassium: 4.1 mmol/L (ref 3.5–5.1)
Sodium: 137 mmol/L (ref 135–145)
Total Bilirubin: 0.3 mg/dL (ref 0.3–1.2)
Total Protein: 6.3 g/dL — ABNORMAL LOW (ref 6.5–8.1)

## 2019-05-03 MED ORDER — HEPARIN SOD (PORK) LOCK FLUSH 100 UNIT/ML IV SOLN
500.0000 [IU] | Freq: Once | INTRAVENOUS | Status: AC
  Administered 2019-05-03: 14:00:00 500 [IU] via INTRAVENOUS
  Filled 2019-05-03: qty 5

## 2019-05-03 MED ORDER — SODIUM CHLORIDE 0.9% FLUSH
10.0000 mL | Freq: Once | INTRAVENOUS | Status: AC
  Administered 2019-05-03: 14:00:00 10 mL via INTRAVENOUS
  Filled 2019-05-03: qty 10

## 2019-05-04 ENCOUNTER — Inpatient Hospital Stay: Payer: Medicare PPO

## 2019-05-04 ENCOUNTER — Telehealth: Payer: Self-pay

## 2019-05-04 LAB — CEA: CEA: 1.7 ng/mL (ref 0.0–4.7)

## 2019-05-04 NOTE — Telephone Encounter (Signed)
Message sent to Dr. Sheryn Bison with updated lab information per Dr. Janese Banks.

## 2019-05-07 ENCOUNTER — Inpatient Hospital Stay: Payer: Medicare PPO | Admitting: Oncology

## 2019-05-07 ENCOUNTER — Inpatient Hospital Stay: Payer: Medicare PPO

## 2019-05-11 ENCOUNTER — Inpatient Hospital Stay: Payer: Medicare PPO

## 2019-05-12 HISTORY — PX: ROBOT ASSISTED LAPAROSCOPIC PARTIAL COLECTOMY: SHX6476

## 2019-05-13 DIAGNOSIS — C187 Malignant neoplasm of sigmoid colon: Secondary | ICD-10-CM | POA: Insufficient documentation

## 2019-05-14 ENCOUNTER — Inpatient Hospital Stay: Payer: Medicare PPO

## 2019-05-14 ENCOUNTER — Inpatient Hospital Stay: Payer: Medicare PPO | Admitting: Oncology

## 2019-05-20 DIAGNOSIS — R7689 Other specified abnormal immunological findings in serum: Secondary | ICD-10-CM | POA: Insufficient documentation

## 2019-05-20 DIAGNOSIS — R768 Other specified abnormal immunological findings in serum: Secondary | ICD-10-CM | POA: Insufficient documentation

## 2019-05-21 ENCOUNTER — Inpatient Hospital Stay: Payer: Medicare PPO | Admitting: Oncology

## 2019-05-21 ENCOUNTER — Inpatient Hospital Stay: Payer: Medicare PPO

## 2019-06-10 ENCOUNTER — Other Ambulatory Visit: Payer: Medicare PPO

## 2019-06-10 ENCOUNTER — Ambulatory Visit: Payer: Medicare PPO | Admitting: Oncology

## 2019-06-14 ENCOUNTER — Other Ambulatory Visit: Payer: Self-pay

## 2019-06-14 ENCOUNTER — Inpatient Hospital Stay: Payer: Medicare PPO | Attending: Oncology

## 2019-06-14 DIAGNOSIS — Z933 Colostomy status: Secondary | ICD-10-CM | POA: Insufficient documentation

## 2019-06-14 DIAGNOSIS — E7800 Pure hypercholesterolemia, unspecified: Secondary | ICD-10-CM | POA: Diagnosis not present

## 2019-06-14 DIAGNOSIS — Z8546 Personal history of malignant neoplasm of prostate: Secondary | ICD-10-CM | POA: Insufficient documentation

## 2019-06-14 DIAGNOSIS — E785 Hyperlipidemia, unspecified: Secondary | ICD-10-CM | POA: Insufficient documentation

## 2019-06-14 DIAGNOSIS — R5381 Other malaise: Secondary | ICD-10-CM | POA: Insufficient documentation

## 2019-06-14 DIAGNOSIS — C20 Malignant neoplasm of rectum: Secondary | ICD-10-CM | POA: Diagnosis not present

## 2019-06-14 DIAGNOSIS — E119 Type 2 diabetes mellitus without complications: Secondary | ICD-10-CM | POA: Diagnosis not present

## 2019-06-14 DIAGNOSIS — Z79899 Other long term (current) drug therapy: Secondary | ICD-10-CM | POA: Insufficient documentation

## 2019-06-14 DIAGNOSIS — I10 Essential (primary) hypertension: Secondary | ICD-10-CM | POA: Insufficient documentation

## 2019-06-14 DIAGNOSIS — R5382 Chronic fatigue, unspecified: Secondary | ICD-10-CM | POA: Diagnosis not present

## 2019-06-14 DIAGNOSIS — I2510 Atherosclerotic heart disease of native coronary artery without angina pectoris: Secondary | ICD-10-CM | POA: Insufficient documentation

## 2019-06-14 DIAGNOSIS — Z5111 Encounter for antineoplastic chemotherapy: Secondary | ICD-10-CM

## 2019-06-14 LAB — COMPREHENSIVE METABOLIC PANEL
ALT: 14 U/L (ref 0–44)
AST: 18 U/L (ref 15–41)
Albumin: 3.3 g/dL — ABNORMAL LOW (ref 3.5–5.0)
Alkaline Phosphatase: 60 U/L (ref 38–126)
Anion gap: 7 (ref 5–15)
BUN: 14 mg/dL (ref 8–23)
CO2: 30 mmol/L (ref 22–32)
Calcium: 8.7 mg/dL — ABNORMAL LOW (ref 8.9–10.3)
Chloride: 100 mmol/L (ref 98–111)
Creatinine, Ser: 1.03 mg/dL (ref 0.61–1.24)
GFR calc Af Amer: >60 mL/min (ref >60)
GFR calc non Af Amer: >60 mL/min (ref >60)
Glucose, Bld: 180 mg/dL — ABNORMAL HIGH (ref 70–99)
Potassium: 3.8 mmol/L (ref 3.5–5.1)
Sodium: 137 mmol/L (ref 135–145)
Total Bilirubin: 0.4 mg/dL (ref 0.3–1.2)
Total Protein: 6.4 g/dL — ABNORMAL LOW (ref 6.5–8.1)

## 2019-06-14 LAB — CBC WITH DIFFERENTIAL/PLATELET
Abs Immature Granulocytes: 0.01 10*3/uL (ref 0.00–0.07)
Basophils Absolute: 0 10*3/uL (ref 0.0–0.1)
Basophils Relative: 1 %
Eosinophils Absolute: 0 10*3/uL (ref 0.0–0.5)
Eosinophils Relative: 1 %
HCT: 30.4 % — ABNORMAL LOW (ref 39.0–52.0)
Hemoglobin: 10.2 g/dL — ABNORMAL LOW (ref 13.0–17.0)
Immature Granulocytes: 0 %
Lymphocytes Relative: 17 %
Lymphs Abs: 0.6 10*3/uL — ABNORMAL LOW (ref 0.7–4.0)
MCH: 29.8 pg (ref 26.0–34.0)
MCHC: 33.6 g/dL (ref 30.0–36.0)
MCV: 88.9 fL (ref 80.0–100.0)
Monocytes Absolute: 0.3 10*3/uL (ref 0.1–1.0)
Monocytes Relative: 8 %
Neutro Abs: 2.5 10*3/uL (ref 1.7–7.7)
Neutrophils Relative %: 73 %
Platelets: 225 10*3/uL (ref 150–400)
RBC: 3.42 MIL/uL — ABNORMAL LOW (ref 4.22–5.81)
RDW: 15 % (ref 11.5–15.5)
WBC: 3.4 10*3/uL — ABNORMAL LOW (ref 4.0–10.5)
nRBC: 0 % (ref 0.0–0.2)

## 2019-06-17 ENCOUNTER — Other Ambulatory Visit: Payer: Medicare PPO

## 2019-06-17 ENCOUNTER — Inpatient Hospital Stay (HOSPITAL_BASED_OUTPATIENT_CLINIC_OR_DEPARTMENT_OTHER): Payer: Medicare PPO | Admitting: Oncology

## 2019-06-17 ENCOUNTER — Other Ambulatory Visit: Payer: Self-pay

## 2019-06-17 ENCOUNTER — Encounter: Payer: Self-pay | Admitting: Oncology

## 2019-06-17 DIAGNOSIS — T451X5A Adverse effect of antineoplastic and immunosuppressive drugs, initial encounter: Secondary | ICD-10-CM

## 2019-06-17 DIAGNOSIS — D701 Agranulocytosis secondary to cancer chemotherapy: Secondary | ICD-10-CM | POA: Diagnosis not present

## 2019-06-17 DIAGNOSIS — C20 Malignant neoplasm of rectum: Secondary | ICD-10-CM

## 2019-06-17 DIAGNOSIS — D649 Anemia, unspecified: Secondary | ICD-10-CM

## 2019-06-17 NOTE — Progress Notes (Signed)
Patient stated that he had been doing better. Patient stated that he continues to have neuropathy on his hands and feet but doing better.

## 2019-06-18 NOTE — Progress Notes (Signed)
I connected with Willie [name] on 06/18/19 at  2:00 PM EDT by video enabled telemedicine visit and verified that I am speaking with the correct person using two identifiers.   I discussed the limitations, risks, security and privacy concerns of performing an evaluation and management service by telemedicine and the availability of in-person appointments. I also discussed with the patient that there may be a patient responsible charge related to this service. The patient expressed understanding and agreed to proceed.  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 He was recently found to have iron deficiency anemia which led to a colonoscopy On 07/15/2018. Colonoscopy showed a villous partially obstructing medium-sized mass in the mid sigmoid colon. The mass was partially circumferential measuring 5 cm. There was another infiltrative partially obstructing medium-sized mass found in the rectosigmoid colon which also measured 5 cm. Pathology from the sigmoid colon mass showed high-grade dysplasia at least involving an adenomatous lesion with villous features. A more serious process is not excluded. Rectosigmoid mass biopsy showed invasive colorectal adenocarcinoma.  Patient was referred to Dr. [name] for surgical management. Patient had a CT abdomen and pelvis with contrast which showed irregular asymmetric mural thickening of the proximal rectum measuring over 6 to 7 cm. Potential local nodal metastases with several subcentimeter iliac lymph nodes and presacral lymph nodes. No evidence of metastatic disease  outside the pelvis.  Patient also had MRI of the pelvis with and without contrast which showed tumor length 8.5 cm with extension through muscularis propria decreased C. There is diffuse involvement of muscularis propria. No extramural vascular invasion or tumor thrombus. Shortest distance of any tumor/note from mesorectal fascia 1 to 2 mm. No involvement of adjacent organs or pelvic sidewall. Mesorectal lymph nodes within perirectal fat including 6 mm node and a low sigmoid mesocolon node measuring 6 mm. Another 6 mm node within the low sigmoid mesocolon representing extremity rectal lymphadenopathy. T3N2 by MRI. Distance from tumor to internal anal sphincter is 6.2 cm.  Plan is to proceed with total neoadjuvant chemotherapy followed by definitive surgery.Patient completed 8 cycles of neoadjuvant FOLFOX chemotherapy on 12/07/2018. Interim scans showed similar-appearing irregular wall thickening of the rectum but no retroperitoneal pelvic adenopathy.Patient completed concurrent chemoradiation end of November 2020.  Patient had partial colectomy with a colostomy at [facility].  Final pathology showed invasive adenocarcinoma 6 cm grade 2.  Tumor deposit present.  Tumor invades through muscularis propria into pericolorectal tissue.  Lymphovascular invasion present.  Perineural invasion absent.  Treatment effect absent.  Extensive residual cancer with no evident tumor regression.  Poor to no response.  Margins negative.  1 out of 39 lymph nodes positive.  [id number]  Interval history: Patient is doing well post surgery.  He has a colostomy in place which is functioning well and reports no complaints at this time other than chronic fatigue.  Bowel movements are regular and appetite is stable.   Review of Systems  Constitutional: Positive for malaise/fatigue. Negative for chills, fever and weight loss.  HENT: Negative for congestion, ear discharge and nosebleeds.   Eyes: Negative for blurred vision.   Respiratory: Negative for cough, hemoptysis, sputum production, shortness of  breath and wheezing.   Cardiovascular: Negative for chest pain, palpitations, orthopnea and claudication.  Gastrointestinal: Negative for abdominal pain, blood in stool, constipation, diarrhea, heartburn, melena, nausea and vomiting.  Genitourinary: Negative for dysuria, flank pain, frequency, hematuria and urgency.  Musculoskeletal: Negative for back pain, joint pain and myalgias.  Skin: Negative for rash.  Neurological: Negative for dizziness, tingling, focal weakness, seizures, weakness and headaches.  Endo/Heme/Allergies: Does not bruise/bleed easily.  Psychiatric/Behavioral: Negative for depression and suicidal ideas. The patient does not have insomnia.     No Known Allergies  Past Medical History:  Diagnosis Date  . Anemia   . Coronary artery disease   . Diabetes mellitus without complication ([place])   . History of hiatal hernia   . Hypercholesterolemia   . Hypertension   . Prostate cancer ([place]) 2000   Prostatectomy.   . 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[place]) 09/2018   Chemo tx's.     Past Surgical History:  Procedure Laterality Date  . COLONOSCOPY WITH PROPOFOL N/A 07/15/2018   Procedure: COLONOSCOPY WITH PROPOFOL;  Surgeon: [name], [name], MD;  Location: [facility] ENDOSCOPY;  Service: Gastroenterology;  Laterality: N/A;  . ESOPHAGOGASTRODUODENOSCOPY N/A 10/12/2018   Procedure: ESOPHAGOGASTRODUODENOSCOPY (EGD);  Surgeon: [name], MD;  Location: [facility] ENDOSCOPY;  Service: Gastroenterology;  Laterality: N/A;  . ESOPHAGOGASTRODUODENOSCOPY (EGD) WITH PROPOFOL N/A 07/15/2018   Procedure: ESOPHAGOGASTRODUODENOSCOPY (EGD) WITH PROPOFOL;  Surgeon: [name], [name], MD;  Location: [facility] ENDOSCOPY;  Service: Gastroenterology;  Laterality: N/A;  . PORTACATH PLACEMENT N/A 08/13/2018   Procedure: INSERTION PORT-A-CATH;  Surgeon: [name], MD;  Location: [facility] ORS;  Service: General;  Laterality: N/A;  . PROSTATE  SURGERY      Social History   Socioeconomic History  . Marital status: Widowed    Spouse name: Not on file  . Number of children: 0  . Years of education: Not on file  . Highest education level: Not on file  Occupational History  . Occupation: Pharmacist, hospital    Comment: Retired  Tobacco Use  . Smoking status: Never Smoker  . Smokeless tobacco: Never Used  Substance and Sexual Activity  . Alcohol use: Not Currently  . Drug use: Never  . Sexual activity: Not Currently  Other Topics Concern  . 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  His wife was a Camera operator and they traveled frequently.  Several nieces and nephews.  He is the youngest of his brothers and sisters and only surviving.   Social Determinants of Health   Financial Resource Strain:   . Difficulty of Paying Living Expenses:   Food Insecurity:   . Worried About Charity fundraiser in the Last Year:   . Arboriculturist in the Last Year:   Transportation Needs:   . Film/video editor (Medical):   Marland Kitchen Lack of Transportation (Non-Medical):   Physical Activity:   . Days of Exercise per Week:   . Minutes of Exercise per Session:   Stress:   . Feeling of Stress :   Social Connections: Unknown  . Frequency of Communication with Friends and Family: More than three times a week  . Frequency of Social Gatherings with Friends and Family: More than three times a week  . Attends Religious Services: Not on file  . Active Member of Clubs or Organizations: Not on file  . Attends Archivist Meetings:  Not on file  . Marital Status: Not on file  Intimate Partner Violence: Not At Risk  . Fear of Current or Ex-Partner: No  . Emotionally Abused: No  . Physically Abused: No  . Sexually Abused: No    History reviewed. No pertinent family history.   Current Outpatient Medications:  .   cefUROXime (CEFTIN) 500 MG tablet, Take 1 tablet by mouth in the morning and at bedtime., Disp: , Rfl:  .  ezetimibe (ZETIA) 10 MG tablet, Take 10 mg by mouth every morning. , Disp: , Rfl:  .  gabapentin (NEURONTIN) 300 MG capsule, Take 1 capsule (300 mg total) by mouth 3 (three) times daily., Disp: 90 capsule, Rfl: 2 .  HUMALOG MIX 75/25 KWIKPEN (75-25) 100 UNIT/ML Kwikpen, Inject 4 Units into the skin every morning., Disp: 15 mL, Rfl: 11 .  magic mouthwash w/lidocaine SOLN, Take 5 mLs by mouth 4 (four) times daily as needed for mouth pain., Disp: 480 mL, Rfl: 1 .  metoprolol succinate (TOPROL-XL) 50 MG 24 hr tablet, Take 1 tablet by mouth 1 day or 1 dose., Disp: , Rfl:  .  oxyCODONE (OXY IR/ROXICODONE) 5 MG immediate release tablet, Take 1 tablet (5 mg total) by mouth every 6 (six) hours as needed for severe pain., Disp: 60 tablet, Rfl: 0 .  pantoprazole (PROTONIX) 40 MG tablet, Take 1 tablet (40 mg total) by mouth 2 (two) times daily before a meal. (Patient taking differently: Take 40 mg by mouth daily. ), Disp: 60 tablet, Rfl: 0 .  traZODone (DESYREL) 50 MG tablet, Take 50 mg by mouth at bedtime as needed for sleep. , Disp: , Rfl:  No current facility-administered medications for this visit.  Facility-Administered Medications Ordered in Other Visits:  .  heparin lock flush 100 unit/mL, 500 Units, Intravenous, Once, Sindy Guadeloupe, MD .  sodium chloride flush (NS) 0.9 % injection 10 mL, 10 mL, Intravenous, Once, Sindy Guadeloupe, MD .  sodium chloride flush (NS) 0.9 % injection 10 mL, 10 mL, Intravenous, Once, Sindy Guadeloupe, MD .  sodium chloride flush (NS) 0.9 % injection 10 mL, 10 mL, Intravenous, PRN, Sindy Guadeloupe, MD  No results found.  No images are attached to the encounter.   CMP Latest Ref Rng & Units 06/14/2019  Glucose 70 - 99 mg/dL 180(H)  BUN 8 - 23 mg/dL 14  Creatinine 0.61 - 1.24 mg/dL 1.03  Sodium 135 - 145 mmol/L 137  Potassium 3.5 - 5.1 mmol/L 3.8  Chloride 98 - 111  mmol/L 100  CO2 22 - 32 mmol/L 30  Calcium 8.9 - 10.3 mg/dL 8.7(L)  Total Protein 6.5 - 8.1 g/dL 6.4(L)  Total Bilirubin 0.3 - 1.2 mg/dL 0.4  Alkaline Phos 38 - 126 U/L 60  AST 15 - 41 U/L 18  ALT 0 - 44 U/L 14   CBC Latest Ref Rng & Units 06/14/2019  WBC 4.0 - 10.5 K/uL 3.4(L)  Hemoglobin 13.0 - 17.0 g/dL 10.2(L)  Hematocrit 39.0 - 52.0 % 30.4(L)  Platelets 150 - 400 K/uL 225     Observation/objective: Appears in no acute distress over video visit today. Breathing is nonlabored  Assessment and plan: Patient is a 79 year old male with stage III rectal adenocarcinoma s/p total neoadjuvant treatment with FOLFOX chemotherapy 4 months followed by concurrent chemoradiation.  Final pathology showed poor response to treatment [id number]  Discussed results of final pathology with the patient.  He did not have good response to chemotherapy and still had a  significant tumor present at the time of surgery.  1 out of 39 lymph nodes was positive for malignancy.At this time plan is active surveillance and he will need a repeat CT chest abdomen and pelvis with contrast in July 2021 which I will schedule.  I will see him following that for an in person exam with a CBC with differential CMP and CEA.  Per NCCN guidelines patient will need scans 6 to 12 months along with exams every 3 to 6 months.  He will need a repeat colonoscopy 1 year from his surgery which would be in March 2022  Patient's white count is better at 3.4 with an [place] of 2.5.  He did have prolonged chemo-induced neutropenia and anemia which is gradually improving  Follow-up instructions: As above  I discussed the assessment and treatment plan with the patient. The patient was provided an opportunity to ask questions and all were answered. The patient agreed with the plan and demonstrated an understanding of the instructions.   The patient was advised to call back or seek an in-person evaluation if the symptoms worsen or if the condition fails  to improve as anticipated.  Visit Diagnosis: 1. 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[place])   2. Chemotherapy induced neutropenia ([facility])   3. Normocytic anemia     Dr. Randa Evens, MD, MPH [facility] Tel- [phone] 06/18/2019 2:17 PM

## 2019-07-06 ENCOUNTER — Other Ambulatory Visit: Payer: Self-pay

## 2019-07-07 ENCOUNTER — Ambulatory Visit
Admission: RE | Admit: 2019-07-07 | Discharge: 2019-07-07 | Disposition: A | Discharge status: Home / Self Care | Payer: Medicare PPO | Source: Ambulatory Visit | Attending: Radiation Oncology | Admitting: Radiation Oncology

## 2019-07-07 ENCOUNTER — Encounter: Payer: Self-pay | Admitting: Radiation Oncology

## 2019-07-07 VITALS — BP 133/70 | HR 73 | Temp 96.3°F | Wt 137.0 lb

## 2019-07-07 DIAGNOSIS — D649 Anemia, unspecified: Secondary | ICD-10-CM | POA: Diagnosis not present

## 2019-07-07 DIAGNOSIS — Z923 Personal history of irradiation: Secondary | ICD-10-CM | POA: Diagnosis not present

## 2019-07-07 DIAGNOSIS — Z933 Colostomy status: Secondary | ICD-10-CM | POA: Diagnosis not present

## 2019-07-07 DIAGNOSIS — C20 Malignant neoplasm of rectum: Secondary | ICD-10-CM | POA: Diagnosis not present

## 2019-07-07 NOTE — Progress Notes (Signed)
Radiation Oncology Follow up Note  Name: Willie [name]   Date:   07/07/2019 MRN:  [id number] DOB: 04-23-1940    This 79 y.o. male presents to the clinic today for 19-month follow-up status post neoadjuvant chemoradiation for stage IIIb adenocarcinoma the rectum status post surgical resection.  REFERRING PROVIDER: [name], MD  HPI: Patient is a 79 year old male who presented with locally advanced stage IIIb (T3 N2 M0) adenocarcinoma the rectum.  He had undergone total neoadjuvant chemotherapy including FOLFOX as well as concurrent chemoradiation and then underwent sole surgical resection.  At [facility] with collate creation of a colostomy.  Tumor was a grade 2 moderately differentiated adenocarcinoma with tumor invading through the muscularis propria into pericolonic rectal tissue.  There was lymph vascular invasion present.  There was extensive residual cancer with no evident tumor response.  Tumor measured 6 cm and all margins were clear.  1 lymph node out of 39 was positive for metastatic disease.  Patient is done well postoperatively colostomy is functioning well he is currently under observation.  He specifically denies any pelvic pain.  His anemia has stabilized.  He is having no lower urinary tract symptoms.  Patient had a CT scan back in January which I have reviewed again showing mild indistinct margins of the upper rectal tumor with reduced surrounding size of surrounding lymph nodes likely responsive to therapy.  COMPLICATIONS OF TREATMENT: none  FOLLOW UP COMPLIANCE: keeps appointments   PHYSICAL EXAM:  BP 133/70 (BP Location: Right Arm, Patient Position: Sitting, Cuff Size: Normal)   Pulse 73   Temp (!) 96.3 F (35.7 C) (Tympanic)   Wt 137 lb (62.1 kg)   BMI 23.52 kg/m  Patient has a functioning colostomy.  Abdomen is benign.  Well-developed well-nourished patient in NAD. HEENT reveals PERLA, EOMI, discs not visualized.  Oral cavity is clear. No oral mucosal lesions are  identified. Neck is clear without evidence of cervical or supraclavicular adenopathy. Lungs are clear to A&P. Cardiac examination is essentially unremarkable with regular rate and rhythm without murmur rub or thrill. Abdomen is benign with no organomegaly or masses noted. Motor sensory and DTR levels are equal and symmetric in the upper and lower extremities. Cranial nerves II through XII are grossly intact. Proprioception is intact. No peripheral adenopathy or edema is identified. No motor or sensory levels are noted. Crude visual fields are within normal range.  RADIOLOGY RESULTS: CT scan from January reviewed compatible with above-stated findings  PLAN: Present time patient is doing well clinically.  Not as significant response to neoadjuvant chemoradiation as we would anticipate although only 1 lymph node of 39 was positive.  At this time would continue observation.  Dr. Janese Banks will continue follow-up care.  I have asked to see him back in 6 months for follow-up.  We will be happy to reevaluate the patient anytime should further radiation opinion be indicated.  I would like to take this opportunity to thank you for allowing me to participate in the care of your patient.Noreene Filbert, MD

## 2019-07-08 ENCOUNTER — Ambulatory Visit: Payer: Medicare PPO | Admitting: Oncology

## 2019-07-08 ENCOUNTER — Other Ambulatory Visit: Payer: Medicare PPO

## 2019-07-28 ENCOUNTER — Inpatient Hospital Stay: Payer: Medicare PPO | Attending: Oncology

## 2019-07-28 ENCOUNTER — Other Ambulatory Visit: Payer: Self-pay

## 2019-07-28 ENCOUNTER — Telehealth: Payer: Self-pay | Admitting: *Deleted

## 2019-07-28 DIAGNOSIS — Z9221 Personal history of antineoplastic chemotherapy: Secondary | ICD-10-CM | POA: Insufficient documentation

## 2019-07-28 DIAGNOSIS — R300 Dysuria: Secondary | ICD-10-CM

## 2019-07-28 DIAGNOSIS — C20 Malignant neoplasm of rectum: Secondary | ICD-10-CM | POA: Diagnosis not present

## 2019-07-28 LAB — URINALYSIS, COMPLETE (UACMP) WITH MICROSCOPIC
Bilirubin Urine: NEGATIVE
Glucose, UA: NEGATIVE mg/dL
Ketones, ur: NEGATIVE mg/dL
Nitrite: NEGATIVE
Protein, ur: NEGATIVE mg/dL
Specific Gravity, Urine: 1.003 — ABNORMAL LOW (ref 1.005–1.030)
pH: 6 (ref 5.0–8.0)

## 2019-07-28 MED ORDER — CIPROFLOXACIN HCL 500 MG PO TABS: 500.0000 mg | ORAL_TABLET | Freq: Two times a day (BID) | ORAL | 0 refills | Status: DC

## 2019-07-28 NOTE — Telephone Encounter (Signed)
Pt called and said that he is burning with urination and having to go to bathroom frequently. Wanted to see if we cna send in atb. Called rao and she states we cna call in atb but must have urine test first. He is agreeable and will come now to get sample and cipro called into his pharmacy

## 2019-07-31 LAB — URINE CULTURE: Culture: >=100000 — AB

## 2019-08-02 ENCOUNTER — Telehealth: Payer: Self-pay | Admitting: *Deleted

## 2019-08-02 NOTE — Telephone Encounter (Signed)
He is sensitive to cipro. I would not add anything else right now

## 2019-08-02 NOTE — Telephone Encounter (Signed)
I called Mr. Beevers and let him know that since the cipro was sensitive to the bacteria he had in urine that we will watch for now. He will call us in a few days to tell us if it is better or worse

## 2019-08-02 NOTE — Telephone Encounter (Signed)
Pt called to states that he completed cipro yest. Evening and still having some pain on urination. I told him that cult- grew bacteria. Would check with Janese Banks if he needs anything else

## 2019-08-12 ENCOUNTER — Other Ambulatory Visit: Payer: Self-pay | Admitting: *Deleted

## 2019-08-12 ENCOUNTER — Telehealth: Payer: Self-pay | Admitting: *Deleted

## 2019-08-12 DIAGNOSIS — C20 Malignant neoplasm of rectum: Secondary | ICD-10-CM

## 2019-08-12 NOTE — Telephone Encounter (Signed)
Pt. Had called and asked questions and I am Calling him back with answers. And dr Janese Banks will send rx for oxycodone for pain. He can vacuum but make sure he does not over do it. He can have nephew to drive him long distance but take time to stop car and get out a stretch every 1-2 hours.

## 2019-08-13 MED ORDER — OXYCODONE HCL 5 MG PO TABS: 5.0000 mg | ORAL_TABLET | Freq: Two times a day (BID) | ORAL | 0 refills | Status: DC | PRN

## 2019-09-24 ENCOUNTER — Ambulatory Visit
Admission: RE | Admit: 2019-09-24 | Discharge: 2019-09-24 | Disposition: A | Discharge status: Home / Self Care | Payer: Medicare PPO | Source: Ambulatory Visit | Attending: Oncology | Admitting: Oncology

## 2019-09-24 ENCOUNTER — Inpatient Hospital Stay: Payer: Medicare PPO | Attending: Oncology

## 2019-09-24 ENCOUNTER — Other Ambulatory Visit: Payer: Self-pay

## 2019-09-24 DIAGNOSIS — Z923 Personal history of irradiation: Secondary | ICD-10-CM | POA: Diagnosis not present

## 2019-09-24 DIAGNOSIS — C20 Malignant neoplasm of rectum: Secondary | ICD-10-CM

## 2019-09-24 DIAGNOSIS — D701 Agranulocytosis secondary to cancer chemotherapy: Secondary | ICD-10-CM | POA: Diagnosis not present

## 2019-09-24 DIAGNOSIS — T451X5A Adverse effect of antineoplastic and immunosuppressive drugs, initial encounter: Secondary | ICD-10-CM | POA: Diagnosis not present

## 2019-09-24 DIAGNOSIS — Z933 Colostomy status: Secondary | ICD-10-CM | POA: Diagnosis not present

## 2019-09-24 DIAGNOSIS — Z85048 Personal history of other malignant neoplasm of rectum, rectosigmoid junction, and anus: Secondary | ICD-10-CM | POA: Insufficient documentation

## 2019-09-24 DIAGNOSIS — Z9221 Personal history of antineoplastic chemotherapy: Secondary | ICD-10-CM | POA: Diagnosis not present

## 2019-09-24 LAB — COMPREHENSIVE METABOLIC PANEL
ALT: 17 U/L (ref 0–44)
AST: 19 U/L (ref 15–41)
Albumin: 3.7 g/dL (ref 3.5–5.0)
Alkaline Phosphatase: 76 U/L (ref 38–126)
Anion gap: 9 (ref 5–15)
BUN: 18 mg/dL (ref 8–23)
CO2: 25 mmol/L (ref 22–32)
Calcium: 8.9 mg/dL (ref 8.9–10.3)
Chloride: 101 mmol/L (ref 98–111)
Creatinine, Ser: 1 mg/dL (ref 0.61–1.24)
GFR calc Af Amer: >60 mL/min (ref >60)
GFR calc non Af Amer: >60 mL/min (ref >60)
Glucose, Bld: 155 mg/dL — ABNORMAL HIGH (ref 70–99)
Potassium: 3.7 mmol/L (ref 3.5–5.1)
Sodium: 135 mmol/L (ref 135–145)
Total Bilirubin: 0.4 mg/dL (ref 0.3–1.2)
Total Protein: 7.2 g/dL (ref 6.5–8.1)

## 2019-09-24 LAB — CBC WITH DIFFERENTIAL/PLATELET
Abs Immature Granulocytes: 0.01 10*3/uL (ref 0.00–0.07)
Basophils Absolute: 0 10*3/uL (ref 0.0–0.1)
Basophils Relative: 1 %
Eosinophils Absolute: 0 10*3/uL (ref 0.0–0.5)
Eosinophils Relative: 1 %
HCT: 35.3 % — ABNORMAL LOW (ref 39.0–52.0)
Hemoglobin: 11.9 g/dL — ABNORMAL LOW (ref 13.0–17.0)
Immature Granulocytes: 0 %
Lymphocytes Relative: 28 %
Lymphs Abs: 0.8 10*3/uL (ref 0.7–4.0)
MCH: 28.5 pg (ref 26.0–34.0)
MCHC: 33.7 g/dL (ref 30.0–36.0)
MCV: 84.7 fL (ref 80.0–100.0)
Monocytes Absolute: 0.3 10*3/uL (ref 0.1–1.0)
Monocytes Relative: 10 %
Neutro Abs: 1.8 10*3/uL (ref 1.7–7.7)
Neutrophils Relative %: 60 %
Platelets: 201 10*3/uL (ref 150–400)
RBC: 4.17 MIL/uL — ABNORMAL LOW (ref 4.22–5.81)
RDW: 14.6 % (ref 11.5–15.5)
WBC: 2.9 10*3/uL — ABNORMAL LOW (ref 4.0–10.5)
nRBC: 0 % (ref 0.0–0.2)

## 2019-09-24 IMAGING — CR ABDOMEN - 2 VIEW
2 series · 2 of 2 positions shown · non-contrast
Comparison: None.

CLINICAL DATA: Colon cancer.  Lower abdominal pain.

EXAM:
ABDOMEN - 2 VIEW

[abdomen erect]
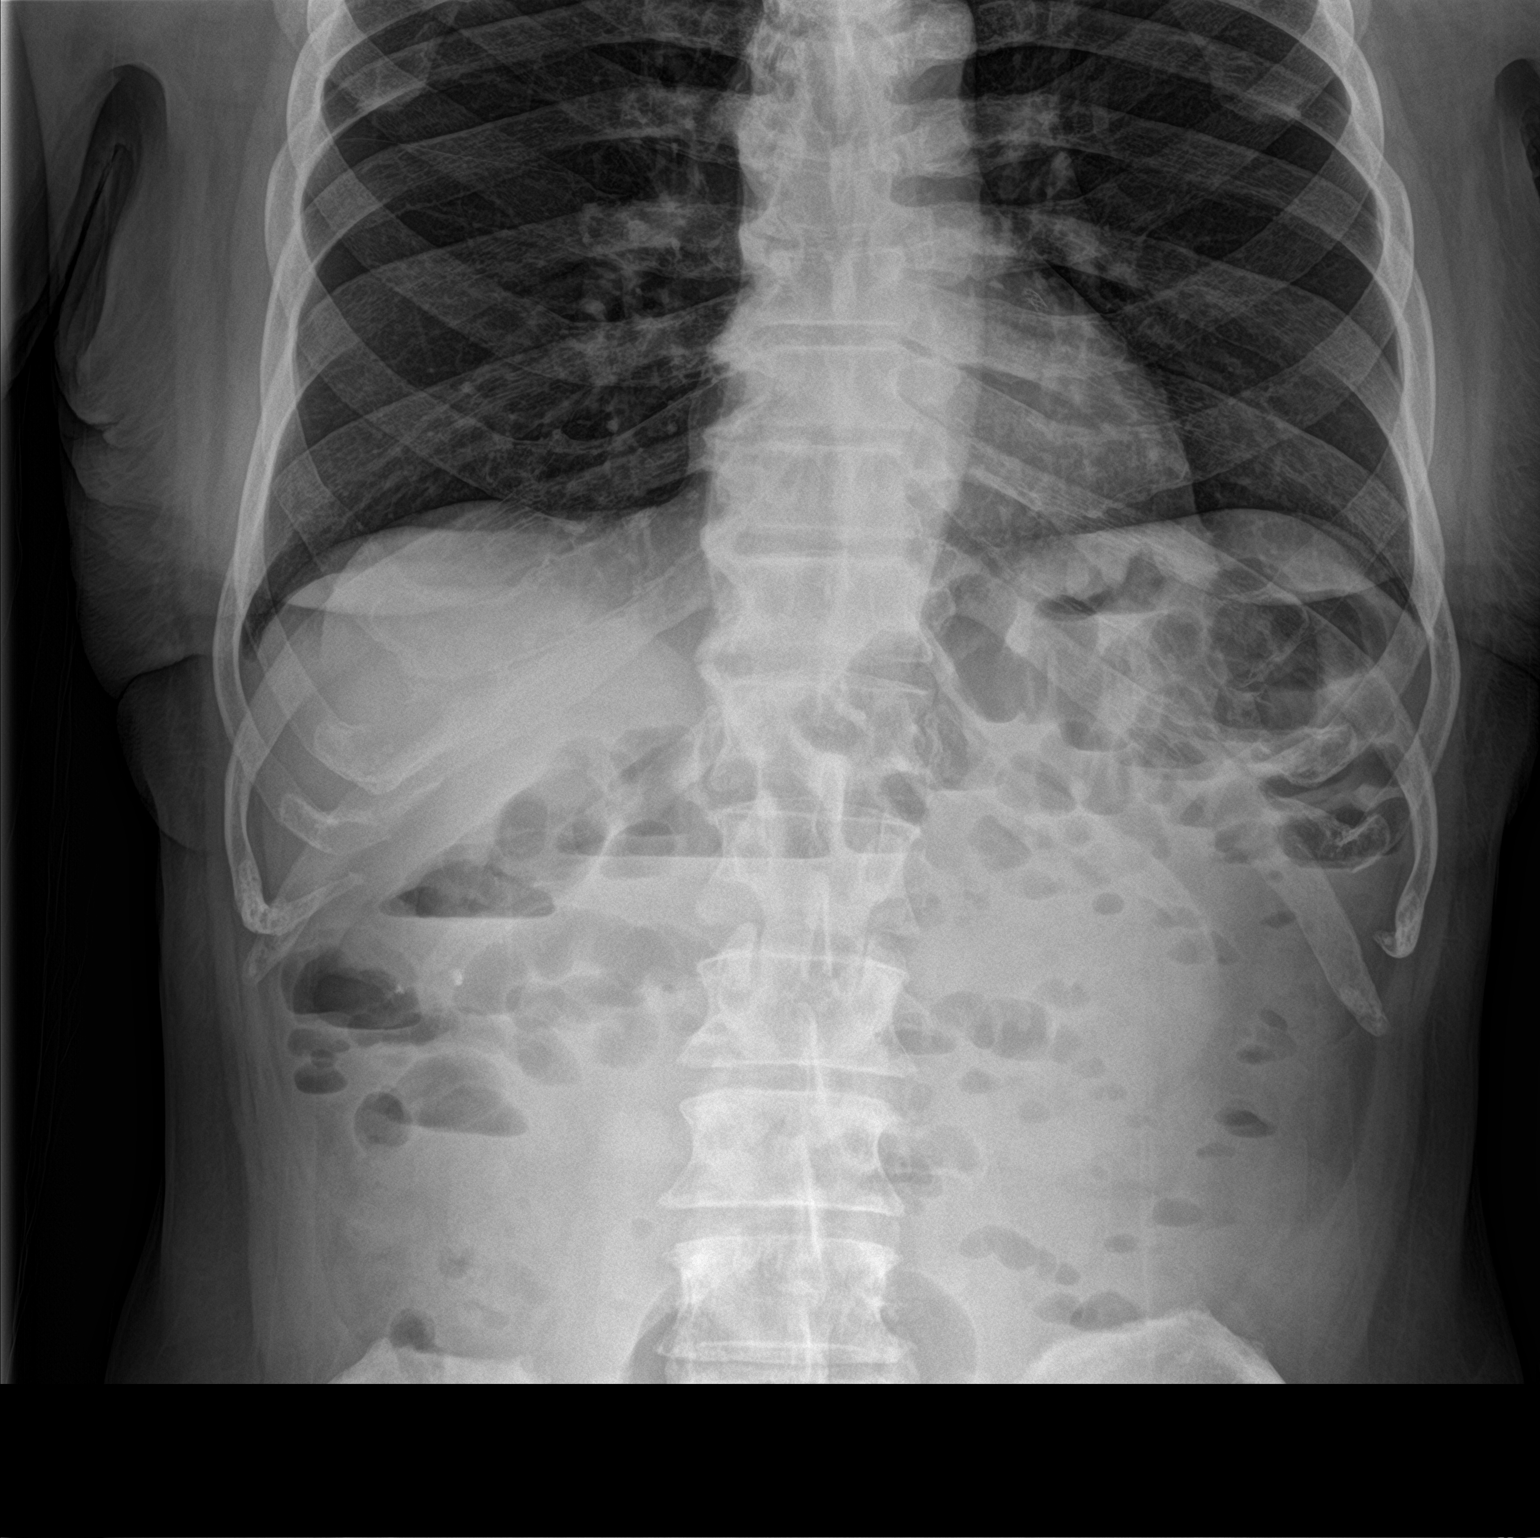

[abdomen supine]
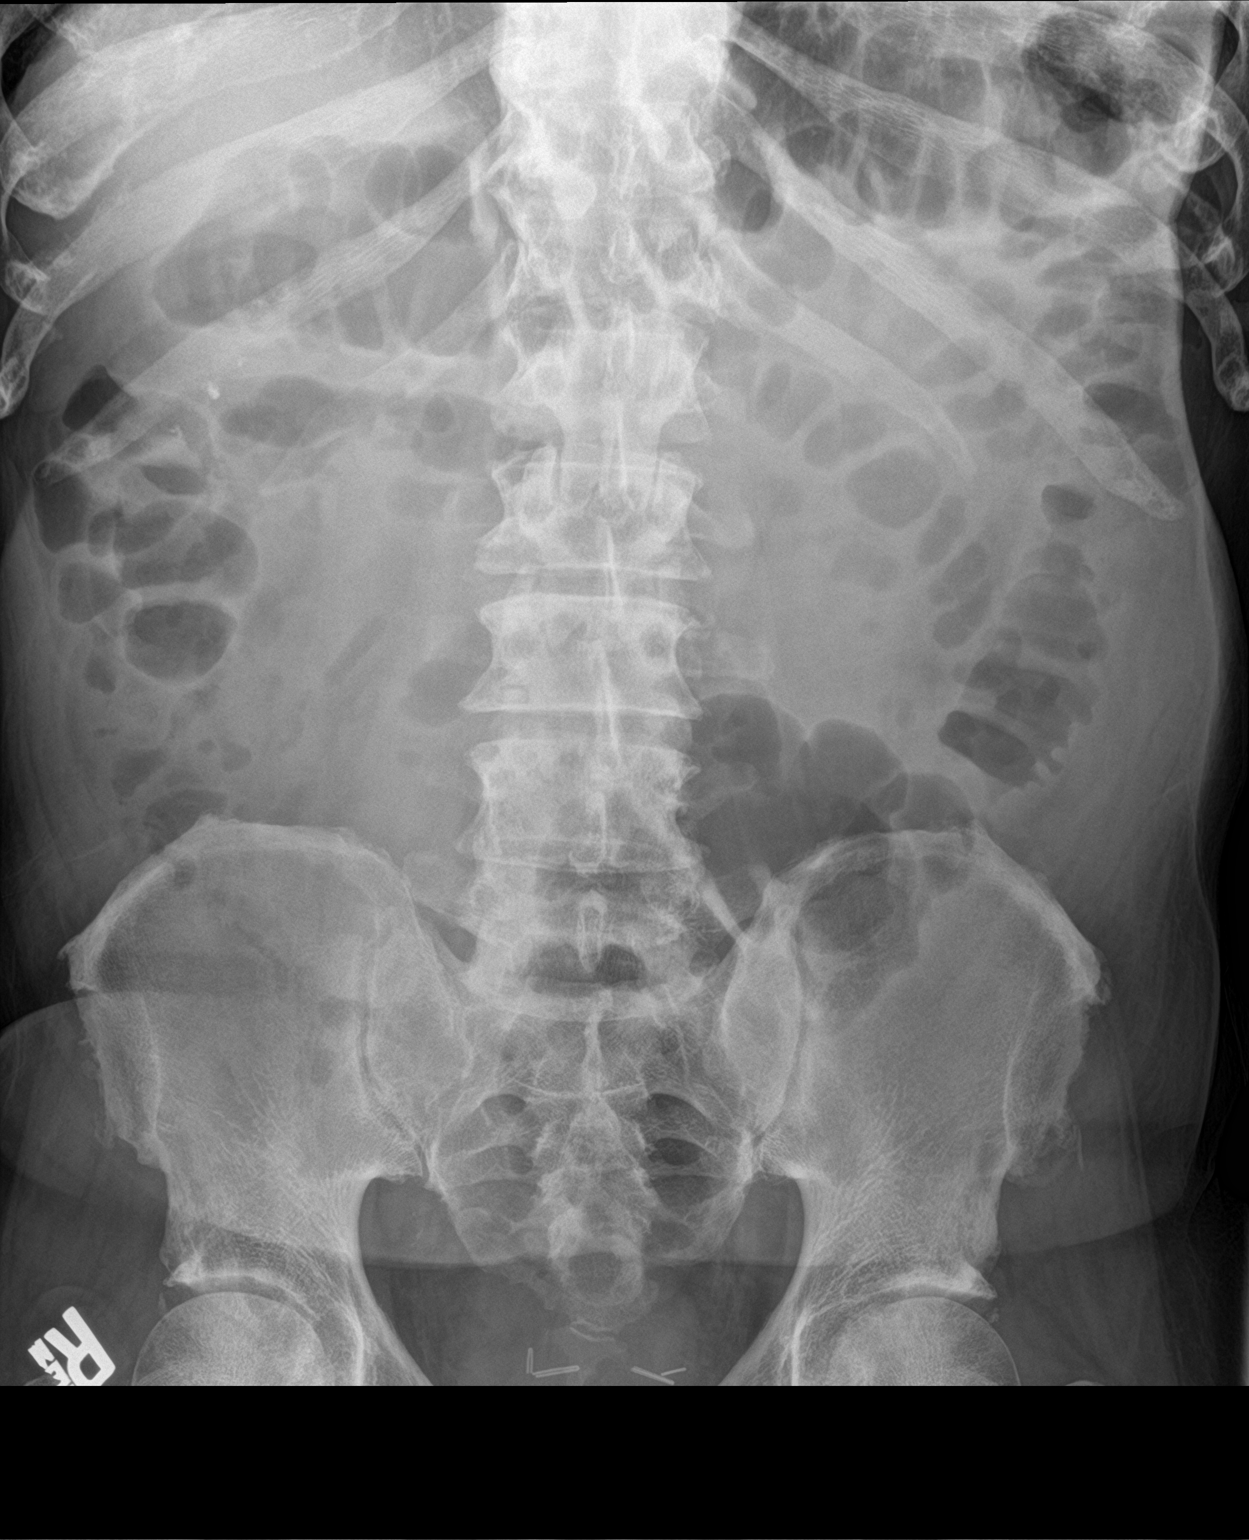

[2 of 2 positions shown; findings below may reference images not displayed]

FINDINGS: Fluid levels are present in non-dilated loops of small bowel. There
is no obstruction. No free air is present.
IMPRESSION: Ileus pattern without for obstruction free air.

## 2019-09-24 MED ORDER — IOHEXOL 300 MG/ML  SOLN
85.0000 mL | Freq: Once | INTRAMUSCULAR | Status: AC | PRN
  Administered 2019-09-24: 85 mL via INTRAVENOUS

## 2019-09-28 ENCOUNTER — Other Ambulatory Visit: Payer: Self-pay

## 2019-09-28 ENCOUNTER — Inpatient Hospital Stay (HOSPITAL_BASED_OUTPATIENT_CLINIC_OR_DEPARTMENT_OTHER): Payer: Medicare PPO | Admitting: Oncology

## 2019-09-28 ENCOUNTER — Other Ambulatory Visit: Payer: Self-pay | Admitting: *Deleted

## 2019-09-28 VITALS — BP 140/66 | HR 66 | Temp 98.2°F | Wt 138.8 lb

## 2019-09-28 DIAGNOSIS — D701 Agranulocytosis secondary to cancer chemotherapy: Secondary | ICD-10-CM | POA: Diagnosis not present

## 2019-09-28 DIAGNOSIS — Z9221 Personal history of antineoplastic chemotherapy: Secondary | ICD-10-CM

## 2019-09-28 DIAGNOSIS — T451X5A Adverse effect of antineoplastic and immunosuppressive drugs, initial encounter: Secondary | ICD-10-CM

## 2019-09-28 DIAGNOSIS — Z933 Colostomy status: Secondary | ICD-10-CM

## 2019-09-28 DIAGNOSIS — Z923 Personal history of irradiation: Secondary | ICD-10-CM

## 2019-09-28 DIAGNOSIS — Z85048 Personal history of other malignant neoplasm of rectum, rectosigmoid junction, and anus: Secondary | ICD-10-CM

## 2019-09-28 DIAGNOSIS — C20 Malignant neoplasm of rectum: Secondary | ICD-10-CM

## 2019-09-28 MED ORDER — OXYCODONE HCL 5 MG PO TABS: 5.0000 mg | ORAL_TABLET | Freq: Three times a day (TID) | ORAL | 0 refills | Status: DC | PRN

## 2019-09-28 NOTE — Progress Notes (Signed)
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 He was recently found to have iron deficiency anemia which led to a colonoscopy On 07/15/2018. Colonoscopy showed a villous partially obstructing medium-sized mass in the mid sigmoid colon. The mass was partially circumferential measuring 5 cm. There was another infiltrative partially obstructing medium-sized mass found in the rectosigmoid colon which also measured 5 cm. Pathology from the sigmoid colon mass showed high-grade dysplasia at least involving an adenomatous lesion with villous features. A more serious process is not excluded. Rectosigmoid mass biopsy showed invasive colorectal adenocarcinoma.  Patient was referred to Dr. [name] for surgical management. Patient had a CT abdomen and pelvis with contrast which showed irregular asymmetric mural thickening of the proximal rectum measuring over 6 to 7 cm. Potential local nodal metastases with several subcentimeter iliac lymph nodes and presacral lymph nodes. No evidence of metastatic disease outside the pelvis.  Patient also had MRI of the pelvis with and without contrast which showed tumor length 8.5 cm with extension through muscularis  propria decreased C. There is diffuse involvement of muscularis propria. No extramural vascular invasion or tumor thrombus. Shortest distance of any tumor/note from mesorectal fascia 1 to 2 mm. No involvement of adjacent organs or pelvic sidewall. Mesorectal lymph nodes within perirectal fat including 6 mm node and a low sigmoid mesocolon node measuring 6 mm. Another 6 mm node within the low sigmoid mesocolon representing extremity rectal lymphadenopathy. T3N2 by MRI. Distance from tumor to internal anal sphincter is 6.2 cm.  Patient completed 8 cycles of neoadjuvant FOLFOX chemotherapy on 12/07/2018. Interim scans showed similar-appearing irregular wall thickening of the rectum but no retroperitoneal pelvic adenopathy.Patient completed concurrent chemoradiation end of November 2020.  Patient had partial colectomy with a colostomy at [facility] in March 2021.  Final pathology showed invasive adenocarcinoma 6 cm grade 2.  Tumor deposit present.  Tumor invades through muscularis propria into pericolorectal tissue.  Lymphovascular invasion present.  Perineural invasion absent.  Treatment effect absent.  Extensive residual cancer with no evident tumor regression.  Poor to no response.  Margins negative.  1 out of 39 lymph nodes positive.  [id number]   Interval history-patient reports having occasional abdominal pain for which she uses as needed oxycodone.  He is down to 138 pounds today as compared to 153 pounds in December.  However he was 130 pounds post surgery and has gained some weight since surgery.  Bowel movements are regular.  Appetite is improving.  ECOG PS- 1 Pain scale- 3 Opioid associated constipation- no  Review of systems- Review of Systems  Constitutional: Positive for malaise/fatigue. Negative for chills, fever and weight loss.  HENT: Negative for congestion, ear discharge and nosebleeds.   Eyes: Negative for blurred vision.  Respiratory: Negative for cough, hemoptysis, sputum  production, shortness of breath and wheezing.   Cardiovascular: Negative for chest pain, palpitations,  orthopnea and claudication.  Gastrointestinal: Positive for abdominal pain. Negative for blood in stool, constipation, diarrhea, heartburn, melena, nausea and vomiting.  Genitourinary: Negative for dysuria, flank pain, frequency, hematuria and urgency.  Musculoskeletal: Negative for back pain, joint pain and myalgias.  Skin: Negative for rash.  Neurological: Negative for dizziness, tingling, focal weakness, seizures, weakness and headaches.  Endo/Heme/Allergies: Does not bruise/bleed easily.  Psychiatric/Behavioral: Negative for depression and suicidal ideas. The patient does not have insomnia.       No Known Allergies   Past Medical History:  Diagnosis Date  . Anemia   . Coronary artery disease   . Diabetes mellitus without complication ([place])   . History of hiatal hernia   . Hypercholesterolemia   . Hypertension   . Prostate cancer ([place]) 2000   Prostatectomy.   . 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[place]) 09/2018   Chemo tx's.      Past Surgical History:  Procedure Laterality Date  . COLONOSCOPY WITH PROPOFOL N/A 07/15/2018   Procedure: COLONOSCOPY WITH PROPOFOL;  Surgeon: [name], [name], MD;  Location: [facility] ENDOSCOPY;  Service: Gastroenterology;  Laterality: N/A;  . ESOPHAGOGASTRODUODENOSCOPY N/A 10/12/2018   Procedure: ESOPHAGOGASTRODUODENOSCOPY (EGD);  Surgeon: [name], MD;  Location: [facility] ENDOSCOPY;  Service: Gastroenterology;  Laterality: N/A;  . ESOPHAGOGASTRODUODENOSCOPY (EGD) WITH PROPOFOL N/A 07/15/2018   Procedure: ESOPHAGOGASTRODUODENOSCOPY (EGD) WITH PROPOFOL;  Surgeon: [name], [name], MD;  Location: [facility] ENDOSCOPY;  Service: Gastroenterology;  Laterality: N/A;  . PORTACATH PLACEMENT N/A 08/13/2018   Procedure: INSERTION PORT-A-CATH;  Surgeon: [name], MD;  Location: [facility] ORS;  Service: General;  Laterality: N/A;  . PROSTATE SURGERY      Social History    Socioeconomic History  . Marital status: Widowed    Spouse name: Not on file  . Number of children: 0  . Years of education: Not on file  . Highest education level: Not on file  Occupational History  . Occupation: Pharmacist, hospital    Comment: Retired  Tobacco Use  . Smoking status: Never Smoker  . Smokeless tobacco: Never Used  Vaping Use  . Vaping Use: Never used  Substance and Sexual Activity  . Alcohol use: Not Currently  . Drug use: Never  . Sexual activity: Not Currently  Other Topics Concern  . 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  His wife was a Camera operator and they traveled frequently.  Several nieces and nephews.  He is the youngest of his brothers and sisters and only surviving.   Social Determinants of Health   Financial Resource Strain:   . Difficulty of Paying Living Expenses:   Food Insecurity:   . Worried About Charity fundraiser in the Last Year:   . Arboriculturist in the Last Year:   Transportation Needs:   . Film/video editor (Medical):   Marland Kitchen Lack of Transportation (Non-Medical):   Physical Activity:   . Days of Exercise per Week:   . Minutes of Exercise per Session:   Stress:   . Feeling of Stress :   Social Connections:   . Frequency of Communication with Friends and Family:   . Frequency of Social Gatherings with Friends and Family:   . Attends Religious Services:   . Active Member of Clubs or Organizations:   . Attends Archivist Meetings:   Marland Kitchen Marital Status:   Intimate Partner Violence:   .  Fear of Current or Ex-Partner:   . Emotionally Abused:   Marland Kitchen Physically Abused:   . Sexually Abused:     No family history on file.   Current Outpatient Medications:  .  ciprofloxacin (CIPRO) 500 MG tablet, Take 1 tablet (500 mg total) by mouth 2 (two) times daily., Disp: 10 tablet, Rfl: 0 .  ezetimibe  (ZETIA) 10 MG tablet, Take 10 mg by mouth every morning. , Disp: , Rfl:  .  gabapentin (NEURONTIN) 300 MG capsule, Take 1 capsule (300 mg total) by mouth 3 (three) times daily., Disp: 90 capsule, Rfl: 2 .  HUMALOG MIX 75/25 KWIKPEN (75-25) 100 UNIT/ML Kwikpen, Inject 4 Units into the skin every morning., Disp: 15 mL, Rfl: 11 .  magic mouthwash w/lidocaine SOLN, Take 5 mLs by mouth 4 (four) times daily as needed for mouth pain., Disp: 480 mL, Rfl: 1 .  metoprolol succinate (TOPROL-XL) 50 MG 24 hr tablet, Take 1 tablet by mouth 1 day or 1 dose., Disp: , Rfl:  .  oxyCODONE (OXY IR/ROXICODONE) 5 MG immediate release tablet, Take 1 tablet (5 mg total) by mouth every 12 (twelve) hours as needed for severe pain., Disp: 30 tablet, Rfl: 0 .  pantoprazole (PROTONIX) 40 MG tablet, Take 1 tablet (40 mg total) by mouth 2 (two) times daily before a meal. (Patient taking differently: Take 40 mg by mouth daily. ), Disp: 60 tablet, Rfl: 0 .  ROCKLATAN 0.02-0.005 % SOLN, , Disp: , Rfl:  .  SIMBRINZA 1-0.2 % SUSP, , Disp: , Rfl:  .  tamsulosin (FLOMAX) 0.4 MG CAPS capsule, Take by mouth., Disp: , Rfl:  .  traZODone (DESYREL) 50 MG tablet, Take 50 mg by mouth at bedtime as needed for sleep. , Disp: , Rfl:  No current facility-administered medications for this visit.  Facility-Administered Medications Ordered in Other Visits:  .  heparin lock flush 100 unit/mL, 500 Units, Intravenous, Once, Sindy Guadeloupe, MD .  sodium chloride flush (NS) 0.9 % injection 10 mL, 10 mL, Intravenous, Once, Sindy Guadeloupe, MD .  sodium chloride flush (NS) 0.9 % injection 10 mL, 10 mL, Intravenous, Once, Sindy Guadeloupe, MD .  sodium chloride flush (NS) 0.9 % injection 10 mL, 10 mL, Intravenous, PRN, Sindy Guadeloupe, MD  Physical exam:  Vitals:   09/28/19 0946  BP: 140/66  Pulse: 66  Temp: 98.2 F (36.8 C)  TempSrc: Oral  SpO2: 100%  Weight: 138 lb 12.8 oz (63 kg)   Physical Exam Constitutional:      General: He is not in acute  distress. Cardiovascular:     Rate and Rhythm: Normal rate and regular rhythm.     Heart sounds: Normal heart sounds.  Pulmonary:     Effort: Pulmonary effort is normal.     Breath sounds: Normal breath sounds.  Abdominal:     General: Bowel sounds are normal.     Palpations: Abdomen is soft.     Comments: Colostomy in place with formed stool in the bag  Skin:    General: Skin is warm and dry.  Neurological:     Mental Status: He is alert and oriented to person, place, and time.      CMP Latest Ref Rng & Units 09/24/2019  Glucose 70 - 99 mg/dL 155(H)  BUN 8 - 23 mg/dL 18  Creatinine 0.61 - 1.24 mg/dL 1.00  Sodium 135 - 145 mmol/L 135  Potassium 3.5 - 5.1 mmol/L 3.7  Chloride 98 - 111  mmol/L 101  CO2 22 - 32 mmol/L 25  Calcium 8.9 - 10.3 mg/dL 8.9  Total Protein 6.5 - 8.1 g/dL 7.2  Total Bilirubin 0.3 - 1.2 mg/dL 0.4  Alkaline Phos 38 - 126 U/L 76  AST 15 - 41 U/L 19  ALT 0 - 44 U/L 17   CBC Latest Ref Rng & Units 09/24/2019  WBC 4.0 - 10.5 K/uL 2.9(L)  Hemoglobin 13.0 - 17.0 g/dL 11.9(L)  Hematocrit 39 - 52 % 35.3(L)  Platelets 150 - 400 K/uL 201    No images are attached to the encounter.  CT Chest W Contrast  Result Date: 09/24/2019 CLINICAL DATA:  Rectosigmoid cancer status post neoadjuvant chemotherapy, resection with colostomy and concurrent chemoradiation therapy. Restaging. Additional remote history of prostate cancer EXAM: CT CHEST, ABDOMEN, AND PELVIS WITH CONTRAST TECHNIQUE: Multidetector CT imaging of the chest, abdomen and pelvis was performed following the standard protocol during bolus administration of intravenous contrast. CONTRAST:  37mL OMNIPAQUE IOHEXOL 300 MG/ML  SOLN COMPARISON:  03/22/2019 CT chest, abdomen and pelvis. FINDINGS: CT CHEST FINDINGS Cardiovascular: Normal heart size. No significant pericardial effusion/thickening. Left anterior descending and left circumflex coronary atherosclerosis. Atherosclerotic nonaneurysmal thoracic aorta. Normal  caliber pulmonary arteries. No central pulmonary emboli. Mediastinum/Nodes: No discrete thyroid nodules. Unremarkable esophagus. No pathologically enlarged axillary, mediastinal or hilar lymph nodes. Lungs/Pleura: No pneumothorax. No pleural effusion. No acute consolidative airspace disease or lung masses. Two tiny peripheral right pulmonary nodules, largest 2 mm in the right upper lobe (series 4/image 59), both stable since 08/07/2026 chest CT, considered benign. No new significant pulmonary nodules. Musculoskeletal: No aggressive appearing focal osseous lesions. Marked thoracic spondylosis. CT ABDOMEN PELVIS FINDINGS Hepatobiliary: Normal liver size. Hypodense subcentimeter left liver dome lesion is too small to characterize and is unchanged. No new liver lesions. Normal gallbladder with no radiopaque cholelithiasis. No biliary ductal dilatation. Pancreas: Normal, with no mass or duct dilation. Spleen: Normal size. No mass. Adrenals/Urinary Tract: Normal adrenals. Simple 1.8 cm lower right renal cyst. Otherwise normal kidneys, with no hydronephrosis. Similar diffuse bladder wall thickening. No significant bladder distention. Stomach/Bowel: Normal non-distended stomach. Normal caliber small bowel with no small bowel wall thickening. Normal appendix. Oral contrast transits to the colostomy. Status post interval distal colectomy with low anterior resection and end colostomy in the ventral left abdominal wall. Marked diverticulosis in the remnant large bowel with no remnant large bowel wall thickening or acute pericolonic fat stranding. There is ill-defined fat stranding, fluid and soft tissue density in the low anterior resection bed and presacral space without a discrete measurable mass or fluid collection. Vascular/Lymphatic: Atherosclerotic nonaneurysmal abdominal aorta. Patent portal, splenic, hepatic and renal veins. no pathologically enlarged lymph nodes in the abdomen or pelvis. Reproductive: Prostatectomy.  Other: No pneumoperitoneum, ascites or focal fluid collection. Musculoskeletal: No aggressive appearing focal osseous lesions. Moderate lumbar spondylosis. IMPRESSION: 1. No evidence of metastatic disease in the chest, abdomen or pelvis. 2. Ill-defined fat stranding, fluid and soft tissue density in the low anterior resection bed and presacral space without a discrete measurable mass or fluid collection, favoring postsurgical/post treatment change. Recommend attention on follow-up CT. 3. Chronic findings include: Two-vessel coronary atherosclerosis. Marked colonic diverticulosis. Chronic diffuse bladder wall thickening, nonspecific. Aortic Atherosclerosis (ICD10-I70.0). Electronically Signed   By: Ilona Sorrel M.D.   On: 09/24/2019 14:52   CT Abdomen Pelvis W Contrast  Result Date: 09/24/2019 CLINICAL DATA:  Rectosigmoid cancer status post neoadjuvant chemotherapy, resection with colostomy and concurrent chemoradiation therapy. Restaging. Additional remote history of  prostate cancer EXAM: CT CHEST, ABDOMEN, AND PELVIS WITH CONTRAST TECHNIQUE: Multidetector CT imaging of the chest, abdomen and pelvis was performed following the standard protocol during bolus administration of intravenous contrast. CONTRAST:  28mL OMNIPAQUE IOHEXOL 300 MG/ML  SOLN COMPARISON:  03/22/2019 CT chest, abdomen and pelvis. FINDINGS: CT CHEST FINDINGS Cardiovascular: Normal heart size. No significant pericardial effusion/thickening. Left anterior descending and left circumflex coronary atherosclerosis. Atherosclerotic nonaneurysmal thoracic aorta. Normal caliber pulmonary arteries. No central pulmonary emboli. Mediastinum/Nodes: No discrete thyroid nodules. Unremarkable esophagus. No pathologically enlarged axillary, mediastinal or hilar lymph nodes. Lungs/Pleura: No pneumothorax. No pleural effusion. No acute consolidative airspace disease or lung masses. Two tiny peripheral right pulmonary nodules, largest 2 mm in the right upper lobe  (series 4/image 59), both stable since 08/07/2026 chest CT, considered benign. No new significant pulmonary nodules. Musculoskeletal: No aggressive appearing focal osseous lesions. Marked thoracic spondylosis. CT ABDOMEN PELVIS FINDINGS Hepatobiliary: Normal liver size. Hypodense subcentimeter left liver dome lesion is too small to characterize and is unchanged. No new liver lesions. Normal gallbladder with no radiopaque cholelithiasis. No biliary ductal dilatation. Pancreas: Normal, with no mass or duct dilation. Spleen: Normal size. No mass. Adrenals/Urinary Tract: Normal adrenals. Simple 1.8 cm lower right renal cyst. Otherwise normal kidneys, with no hydronephrosis. Similar diffuse bladder wall thickening. No significant bladder distention. Stomach/Bowel: Normal non-distended stomach. Normal caliber small bowel with no small bowel wall thickening. Normal appendix. Oral contrast transits to the colostomy. Status post interval distal colectomy with low anterior resection and end colostomy in the ventral left abdominal wall. Marked diverticulosis in the remnant large bowel with no remnant large bowel wall thickening or acute pericolonic fat stranding. There is ill-defined fat stranding, fluid and soft tissue density in the low anterior resection bed and presacral space without a discrete measurable mass or fluid collection. Vascular/Lymphatic: Atherosclerotic nonaneurysmal abdominal aorta. Patent portal, splenic, hepatic and renal veins. no pathologically enlarged lymph nodes in the abdomen or pelvis. Reproductive: Prostatectomy. Other: No pneumoperitoneum, ascites or focal fluid collection. Musculoskeletal: No aggressive appearing focal osseous lesions. Moderate lumbar spondylosis. IMPRESSION: 1. No evidence of metastatic disease in the chest, abdomen or pelvis. 2. Ill-defined fat stranding, fluid and soft tissue density in the low anterior resection bed and presacral space without a discrete measurable mass or  fluid collection, favoring postsurgical/post treatment change. Recommend attention on follow-up CT. 3. Chronic findings include: Two-vessel coronary atherosclerosis. Marked colonic diverticulosis. Chronic diffuse bladder wall thickening, nonspecific. Aortic Atherosclerosis (ICD10-I70.0). Electronically Signed   By: Ilona Sorrel M.D.   On: 09/24/2019 14:52     Assessment and plan- Patient is a 79 y.o. male with stage III rectal adenocarcinoma s/p total neoadjuvant treatment with FOLFOX chemotherapy 4 months followed by concurrent chemoradiation.  Final pathology showed poor response to treatment [id number].  This is a routine visit for rectal cancer surveillance  CT chest abdomen pelvis images which have reviewed independently shows no evidence of cancer recurrence at this time.  Suspect his pain is likely postsurgical due to adhesions.  Colostomy is functioning well.  I will renew his oxycodone which she can take 1-2 times a day as tolerated.  I will see him back in 4 months with CBC with differential CMP and CEA for routine exam.  Scans after 6 months  Chemo-induced neutropenia: Patient has had waxing and waning neutropenia ever since he completed chemotherapy which is essentially stable and we will continue to monitor   Visit Diagnosis 1. Encounter for follow-up surveillance of rectal cancer  2. Chemotherapy induced neutropenia (HCC)      Dr. Randa Evens, MD, MPH [facility] at [facility] [phone] 09/28/2019 10:43 AM

## 2019-10-06 ENCOUNTER — Other Ambulatory Visit: Payer: Self-pay

## 2019-10-06 ENCOUNTER — Encounter: Payer: Self-pay | Admitting: Ophthalmology

## 2019-10-08 ENCOUNTER — Other Ambulatory Visit
Admission: RE | Admit: 2019-10-08 | Discharge: 2019-10-08 | Disposition: A | Discharge status: Home / Self Care | Payer: Medicare PPO | Source: Ambulatory Visit | Attending: Ophthalmology | Admitting: Ophthalmology

## 2019-10-08 ENCOUNTER — Other Ambulatory Visit: Payer: Self-pay

## 2019-10-08 DIAGNOSIS — Z20822 Contact with and (suspected) exposure to covid-19: Secondary | ICD-10-CM | POA: Insufficient documentation

## 2019-10-08 DIAGNOSIS — Z01812 Encounter for preprocedural laboratory examination: Secondary | ICD-10-CM | POA: Insufficient documentation

## 2019-10-08 LAB — SARS CORONAVIRUS 2 (TAT 6-24 HRS): SARS Coronavirus 2: NEGATIVE

## 2019-10-08 NOTE — Discharge Instructions (Signed)

## 2019-10-12 ENCOUNTER — Encounter: Payer: Self-pay | Admitting: Ophthalmology

## 2019-10-12 ENCOUNTER — Other Ambulatory Visit: Payer: Self-pay

## 2019-10-12 ENCOUNTER — Ambulatory Visit: Payer: Medicare PPO | Admitting: Anesthesiology

## 2019-10-12 ENCOUNTER — Encounter
Admission: RE | Disposition: A | Discharge status: Home / Self Care | Payer: Self-pay | Source: Home / Self Care | Attending: Ophthalmology

## 2019-10-12 ENCOUNTER — Ambulatory Visit
Admission: RE | Admit: 2019-10-12 | Discharge: 2019-10-12 | Disposition: A | Discharge status: Home / Self Care | Payer: Medicare PPO | Attending: Ophthalmology | Admitting: Ophthalmology

## 2019-10-12 DIAGNOSIS — D649 Anemia, unspecified: Secondary | ICD-10-CM | POA: Diagnosis not present

## 2019-10-12 DIAGNOSIS — I10 Essential (primary) hypertension: Secondary | ICD-10-CM | POA: Diagnosis not present

## 2019-10-12 DIAGNOSIS — H2511 Age-related nuclear cataract, right eye: Secondary | ICD-10-CM | POA: Diagnosis present

## 2019-10-12 DIAGNOSIS — E1136 Type 2 diabetes mellitus with diabetic cataract: Secondary | ICD-10-CM | POA: Insufficient documentation

## 2019-10-12 DIAGNOSIS — Z9221 Personal history of antineoplastic chemotherapy: Secondary | ICD-10-CM | POA: Insufficient documentation

## 2019-10-12 DIAGNOSIS — Z923 Personal history of irradiation: Secondary | ICD-10-CM | POA: Diagnosis not present

## 2019-10-12 DIAGNOSIS — K449 Diaphragmatic hernia without obstruction or gangrene: Secondary | ICD-10-CM | POA: Insufficient documentation

## 2019-10-12 DIAGNOSIS — Z8546 Personal history of malignant neoplasm of prostate: Secondary | ICD-10-CM | POA: Insufficient documentation

## 2019-10-12 DIAGNOSIS — Z955 Presence of coronary angioplasty implant and graft: Secondary | ICD-10-CM | POA: Insufficient documentation

## 2019-10-12 DIAGNOSIS — I2510 Atherosclerotic heart disease of native coronary artery without angina pectoris: Secondary | ICD-10-CM | POA: Insufficient documentation

## 2019-10-12 DIAGNOSIS — Z85048 Personal history of other malignant neoplasm of rectum, rectosigmoid junction, and anus: Secondary | ICD-10-CM | POA: Insufficient documentation

## 2019-10-12 HISTORY — PX: CATARACT EXTRACTION W/PHACO: SHX586

## 2019-10-12 HISTORY — DX: Presence of dental prosthetic device (complete) (partial): Z97.2

## 2019-10-12 LAB — GLUCOSE, CAPILLARY
Glucose-Capillary: 120 mg/dL — ABNORMAL HIGH (ref 70–99)
Glucose-Capillary: 97 mg/dL (ref 70–99)

## 2019-10-12 SURGERY — PHACOEMULSIFICATION, CATARACT, WITH IOL INSERTION
Anesthesia: Monitor Anesthesia Care | Site: Eye | Laterality: Right

## 2019-10-12 MED ORDER — BRIMONIDINE TARTRATE-TIMOLOL 0.2-0.5 % OP SOLN
OPHTHALMIC | Status: DC | PRN
  Administered 2019-10-12: 1 [drp] via OPHTHALMIC

## 2019-10-12 MED ORDER — NA CHONDROIT SULF-NA HYALURON 40-17 MG/ML IO SOLN
INTRAOCULAR | Status: DC | PRN
  Administered 2019-10-12: 1 mL via INTRAOCULAR

## 2019-10-12 MED ORDER — MIDAZOLAM HCL 2 MG/2ML IJ SOLN
INTRAMUSCULAR | Status: DC | PRN
  Administered 2019-10-12: 1 mg via INTRAVENOUS

## 2019-10-12 MED ORDER — ACETAMINOPHEN 325 MG PO TABS: 325.0000 mg | ORAL_TABLET | ORAL | Status: DC | PRN

## 2019-10-12 MED ORDER — FENTANYL CITRATE (PF) 100 MCG/2ML IJ SOLN
INTRAMUSCULAR | Status: DC | PRN
  Administered 2019-10-12: 25 ug via INTRAVENOUS

## 2019-10-12 MED ORDER — LIDOCAINE HCL (PF) 2 % IJ SOLN
INTRAOCULAR | Status: DC | PRN
  Administered 2019-10-12: 2 mL

## 2019-10-12 MED ORDER — ONDANSETRON HCL 4 MG/2ML IJ SOLN: 4.0000 mg | Freq: Once | INTRAMUSCULAR | Status: DC | PRN

## 2019-10-12 MED ORDER — EPINEPHRINE PF 1 MG/ML IJ SOLN
INTRAOCULAR | Status: DC | PRN
  Administered 2019-10-12: 64 mL via OPHTHALMIC

## 2019-10-12 MED ORDER — ARMC OPHTHALMIC DILATING DROPS
1.0000 "application " | OPHTHALMIC | Status: DC | PRN
  Administered 2019-10-12 (×3): 1 via OPHTHALMIC

## 2019-10-12 MED ORDER — MOXIFLOXACIN HCL 0.5 % OP SOLN
OPHTHALMIC | Status: DC | PRN
  Administered 2019-10-12: 0.2 mL via OPHTHALMIC

## 2019-10-12 MED ORDER — ACETAMINOPHEN 160 MG/5ML PO SOLN: 325.0000 mg | ORAL | Status: DC | PRN

## 2019-10-12 MED ORDER — TETRACAINE HCL 0.5 % OP SOLN
1.0000 [drp] | OPHTHALMIC | Status: DC | PRN
  Administered 2019-10-12 (×3): 1 [drp] via OPHTHALMIC

## 2019-10-12 SURGICAL SUPPLY — 20 items
CANNULA ANT/CHMB 27G (MISCELLANEOUS) ×2 IMPLANT
CANNULA ANT/CHMB 27GA (MISCELLANEOUS) ×6 IMPLANT
GLOVE SURG LX 8.0 MICRO (GLOVE) ×2
GLOVE SURG LX STRL 8.0 MICRO (GLOVE) ×1 IMPLANT
GLOVE SURG TRIUMPH 8.0 PF LTX (GLOVE) ×3 IMPLANT
GOWN STRL REUS W/ TWL LRG LVL3 (GOWN DISPOSABLE) ×2 IMPLANT
GOWN STRL REUS W/TWL LRG LVL3 (GOWN DISPOSABLE) ×6
LENS IOL DIOP 22.0 (Intraocular Lens) ×3 IMPLANT
LENS IOL TECNIS MONO 22.0 (Intraocular Lens) IMPLANT
MARKER SKIN DUAL TIP RULER LAB (MISCELLANEOUS) ×3 IMPLANT
NDL FILTER BLUNT 18X1 1/2 (NEEDLE) ×1 IMPLANT
NEEDLE FILTER BLUNT 18X 1/2SAF (NEEDLE) ×2
NEEDLE FILTER BLUNT 18X1 1/2 (NEEDLE) ×1 IMPLANT
PACK EYE AFTER SURG (MISCELLANEOUS) ×3 IMPLANT
PACK OPTHALMIC (MISCELLANEOUS) ×3 IMPLANT
PACK PORFILIO (MISCELLANEOUS) ×3 IMPLANT
SYR 3ML LL SCALE MARK (SYRINGE) ×3 IMPLANT
SYR TB 1ML LUER SLIP (SYRINGE) ×3 IMPLANT
WATER STERILE IRR 250ML POUR (IV SOLUTION) ×3 IMPLANT
WIPE NON LINTING 3.25X3.25 (MISCELLANEOUS) ×3 IMPLANT

## 2019-10-12 NOTE — H&P (Signed)
All labs reviewed. Abnormal studies sent to patients PCP when indicated.  Previous H&P reviewed, patient examined, there are NO CHANGES.  Willie Henslee Porfilio8/3/20219:39 AM

## 2019-10-12 NOTE — Anesthesia Postprocedure Evaluation (Signed)
Anesthesia Post Note  Patient: Willie [name]  Procedure(s) Performed: CATARACT EXTRACTION PHACO AND INTRAOCULAR LENS PLACEMENT (IOC) RIGHT DIABETIC 6.35 00:37.1 (Right Eye)     Patient location during evaluation: PACU Anesthesia Type: MAC Level of consciousness: awake Pain management: pain level controlled Vital Signs Assessment: post-procedure vital signs reviewed and stable Respiratory status: respiratory function stable Cardiovascular status: stable Postop Assessment: no apparent nausea or vomiting Anesthetic complications: no   No complications documented.  Veda Canning

## 2019-10-12 NOTE — Anesthesia Procedure Notes (Signed)
Procedure Name: MAC Date/Time: 10/12/2019 9:46 AM Performed by: Nyoka Cowden, CRNA Pre-anesthesia Checklist: Patient identified, Emergency Drugs available, Suction available, Timeout performed and Patient being monitored Patient Re-evaluated:Patient Re-evaluated prior to induction Oxygen Delivery Method: Nasal cannula Placement Confirmation: positive ETCO2

## 2019-10-12 NOTE — Anesthesia Preprocedure Evaluation (Signed)
Anesthesia Evaluation  Patient identified by MRN, date of birth, ID band Patient awake    Reviewed: Allergy & Precautions, NPO status   Airway Mallampati: II  TM Distance: >3 FB     Dental   Pulmonary    breath sounds clear to auscultation       Cardiovascular hypertension, + CAD   Rhythm:Regular Rate:Normal     Neuro/Psych    GI/Hepatic hiatal hernia,   Endo/Other  diabetes, Type 2  Renal/GU      Musculoskeletal   Abdominal   Peds  Hematology  (+) anemia ,   Anesthesia Other Findings Hx prostate cancer Hx rectal cancer  Reproductive/Obstetrics                             Anesthesia Physical Anesthesia Plan  ASA: III  Anesthesia Plan: MAC   Post-op Pain Management:    Induction: Intravenous  PONV Risk Score and Plan: TIVA, Midazolam and Treatment may vary due to age or medical condition  Airway Management Planned: Natural Airway and Nasal Cannula  Additional Equipment:   Intra-op Plan:   Post-operative Plan:   Informed Consent: I have reviewed the patients History and Physical, chart, labs and discussed the procedure including the risks, benefits and alternatives for the proposed anesthesia with the patient or authorized representative who has indicated his/her understanding and acceptance.       Plan Discussed with: CRNA  Anesthesia Plan Comments:         Anesthesia Quick Evaluation

## 2019-10-12 NOTE — Op Note (Signed)
PREOPERATIVE DIAGNOSIS:  Nuclear sclerotic cataract of the right eye.   POSTOPERATIVE DIAGNOSIS:  H25.11 Cataract   OPERATIVE PROCEDURE:@   SURGEON:  Birder Robson, MD.   ANESTHESIA:  Anesthesiologist: Veda Canning, MD CRNA: Nyoka Cowden, CRNA  1.      Managed anesthesia care. 2.      0.23ml of Shugarcaine was instilled in the eye following the paracentesis.   COMPLICATIONS:  None.   TECHNIQUE:   Stop and chop   DESCRIPTION OF PROCEDURE:  The patient was examined and consented in the preoperative holding area where the aforementioned topical anesthesia was applied to the right eye and then brought back to the Operating Room where the right eye was prepped and draped in the usual sterile ophthalmic fashion and a lid speculum was placed. A paracentesis was created with the side port blade and the anterior chamber was filled with viscoelastic. A near clear corneal incision was performed with the steel keratome. A continuous curvilinear capsulorrhexis was performed with a cystotome followed by the capsulorrhexis forceps. Hydrodissection and hydrodelineation were carried out with BSS on a blunt cannula. The lens was removed in a stop and chop  technique and the remaining cortical material was removed with the irrigation-aspiration handpiece. The capsular bag was inflated with viscoelastic and the Technis [id number]  lens was placed in the capsular bag without complication. The remaining viscoelastic was removed from the eye with the irrigation-aspiration handpiece. The wounds were hydrated. The anterior chamber was flushed with BSS and the eye was inflated to physiologic pressure. 0.66ml of Vigamox was placed in the anterior chamber. The wounds were found to be water tight. The eye was dressed with Combigan. The patient was given protective glasses to wear throughout the day and a shield with which to sleep tonight. The patient was also given drops with which to begin a drop regimen today and will  follow-up with me in one day. Implant Name Type Inv. Item Serial No. Manufacturer Lot No. LRB No. Used Action  LENS IOL DIOP 22.0 - [id number] Intraocular Lens LENS IOL DIOP 22.0 [id number] [employer]  Right 1 Wasted   Procedure(s): CATARACT EXTRACTION PHACO AND INTRAOCULAR LENS PLACEMENT (IOC) RIGHT DIABETIC 6.35 00:37.1 (Right)  Electronically signed: Birder Robson 10/12/2019 10:09 AM

## 2019-10-12 NOTE — Transfer of Care (Signed)
Immediate Anesthesia Transfer of Care Note  Patient: Willie [name]  Procedure(s) Performed: CATARACT EXTRACTION PHACO AND INTRAOCULAR LENS PLACEMENT (IOC) RIGHT DIABETIC 6.35 00:37.1 (Right Eye)  Patient Location: PACU  Anesthesia Type: MAC  Level of Consciousness: awake, alert  and patient cooperative  Airway and Oxygen Therapy: Patient Spontanous Breathing and Patient connected to supplemental oxygen  Post-op Assessment: Post-op Vital signs reviewed, Patient's Cardiovascular Status Stable, Respiratory Function Stable, Patent Airway and No signs of Nausea or vomiting  Post-op Vital Signs: Reviewed and stable  Complications: No complications documented.

## 2019-10-13 ENCOUNTER — Encounter: Payer: Self-pay | Admitting: Ophthalmology

## 2019-10-25 ENCOUNTER — Encounter: Payer: Self-pay | Admitting: Ophthalmology

## 2019-10-25 ENCOUNTER — Telehealth: Payer: Self-pay | Admitting: *Deleted

## 2019-10-25 NOTE — Telephone Encounter (Signed)
Pt called wanting to know if pt can have booster vaccine for covid. I spoke to Janese Banks and she says yes. I called pt back and he will check to see if pharmacy has them in stock yet.

## 2019-12-30 ENCOUNTER — Other Ambulatory Visit: Payer: Self-pay | Admitting: *Deleted

## 2020-01-07 ENCOUNTER — Ambulatory Visit: Payer: Medicare PPO | Admitting: Radiation Oncology

## 2020-01-28 ENCOUNTER — Ambulatory Visit
Admission: RE | Admit: 2020-01-28 | Discharge: 2020-01-28 | Disposition: A | Discharge status: Home / Self Care | Payer: Medicare PPO | Source: Ambulatory Visit | Attending: Radiation Oncology | Admitting: Radiation Oncology

## 2020-01-28 ENCOUNTER — Inpatient Hospital Stay: Payer: Medicare PPO | Attending: Oncology

## 2020-01-28 ENCOUNTER — Other Ambulatory Visit: Payer: Self-pay | Admitting: *Deleted

## 2020-01-28 ENCOUNTER — Other Ambulatory Visit: Payer: Self-pay

## 2020-01-28 ENCOUNTER — Inpatient Hospital Stay: Payer: Medicare PPO | Admitting: Oncology

## 2020-01-28 ENCOUNTER — Encounter: Payer: Self-pay | Admitting: Oncology

## 2020-01-28 VITALS — BP 147/64 | HR 54 | Temp 97.1°F | Resp 18 | Wt 146.2 lb

## 2020-01-28 DIAGNOSIS — Z8546 Personal history of malignant neoplasm of prostate: Secondary | ICD-10-CM | POA: Diagnosis not present

## 2020-01-28 DIAGNOSIS — C20 Malignant neoplasm of rectum: Secondary | ICD-10-CM | POA: Insufficient documentation

## 2020-01-28 DIAGNOSIS — Z933 Colostomy status: Secondary | ICD-10-CM | POA: Diagnosis not present

## 2020-01-28 DIAGNOSIS — E119 Type 2 diabetes mellitus without complications: Secondary | ICD-10-CM | POA: Insufficient documentation

## 2020-01-28 DIAGNOSIS — E785 Hyperlipidemia, unspecified: Secondary | ICD-10-CM | POA: Insufficient documentation

## 2020-01-28 DIAGNOSIS — Z08 Encounter for follow-up examination after completed treatment for malignant neoplasm: Secondary | ICD-10-CM | POA: Diagnosis not present

## 2020-01-28 DIAGNOSIS — Z79899 Other long term (current) drug therapy: Secondary | ICD-10-CM | POA: Insufficient documentation

## 2020-01-28 DIAGNOSIS — I10 Essential (primary) hypertension: Secondary | ICD-10-CM | POA: Insufficient documentation

## 2020-01-28 DIAGNOSIS — E7800 Pure hypercholesterolemia, unspecified: Secondary | ICD-10-CM | POA: Insufficient documentation

## 2020-01-28 DIAGNOSIS — R591 Generalized enlarged lymph nodes: Secondary | ICD-10-CM | POA: Diagnosis not present

## 2020-01-28 DIAGNOSIS — Z85048 Personal history of other malignant neoplasm of rectum, rectosigmoid junction, and anus: Secondary | ICD-10-CM | POA: Diagnosis not present

## 2020-01-28 DIAGNOSIS — M79601 Pain in right arm: Secondary | ICD-10-CM | POA: Diagnosis not present

## 2020-01-28 DIAGNOSIS — R109 Unspecified abdominal pain: Secondary | ICD-10-CM | POA: Diagnosis not present

## 2020-01-28 DIAGNOSIS — G8928 Other chronic postprocedural pain: Secondary | ICD-10-CM | POA: Diagnosis not present

## 2020-01-28 DIAGNOSIS — I2510 Atherosclerotic heart disease of native coronary artery without angina pectoris: Secondary | ICD-10-CM | POA: Diagnosis not present

## 2020-01-28 DIAGNOSIS — Z923 Personal history of irradiation: Secondary | ICD-10-CM | POA: Diagnosis not present

## 2020-01-28 LAB — CBC WITH DIFFERENTIAL/PLATELET
Abs Immature Granulocytes: 0.01 10*3/uL (ref 0.00–0.07)
Basophils Absolute: 0 10*3/uL (ref 0.0–0.1)
Basophils Relative: 1 %
Eosinophils Absolute: 0.1 10*3/uL (ref 0.0–0.5)
Eosinophils Relative: 2 %
HCT: 37.4 % — ABNORMAL LOW (ref 39.0–52.0)
Hemoglobin: 12.7 g/dL — ABNORMAL LOW (ref 13.0–17.0)
Immature Granulocytes: 0 %
Lymphocytes Relative: 30 %
Lymphs Abs: 0.8 10*3/uL (ref 0.7–4.0)
MCH: 29.8 pg (ref 26.0–34.0)
MCHC: 34 g/dL (ref 30.0–36.0)
MCV: 87.8 fL (ref 80.0–100.0)
Monocytes Absolute: 0.3 10*3/uL (ref 0.1–1.0)
Monocytes Relative: 11 %
Neutro Abs: 1.5 10*3/uL — ABNORMAL LOW (ref 1.7–7.7)
Neutrophils Relative %: 56 %
Platelets: 184 10*3/uL (ref 150–400)
RBC: 4.26 MIL/uL (ref 4.22–5.81)
RDW: 14.1 % (ref 11.5–15.5)
WBC: 2.7 10*3/uL — ABNORMAL LOW (ref 4.0–10.5)
nRBC: 0 % (ref 0.0–0.2)

## 2020-01-28 LAB — COMPREHENSIVE METABOLIC PANEL
ALT: 25 U/L (ref 0–44)
AST: 23 U/L (ref 15–41)
Albumin: 3.7 g/dL (ref 3.5–5.0)
Alkaline Phosphatase: 68 U/L (ref 38–126)
Anion gap: 9 (ref 5–15)
BUN: 27 mg/dL — ABNORMAL HIGH (ref 8–23)
CO2: 26 mmol/L (ref 22–32)
Calcium: 9.3 mg/dL (ref 8.9–10.3)
Chloride: 102 mmol/L (ref 98–111)
Creatinine, Ser: 1.27 mg/dL — ABNORMAL HIGH (ref 0.61–1.24)
GFR, Estimated: 57 mL/min — ABNORMAL LOW (ref >60)
Glucose, Bld: 156 mg/dL — ABNORMAL HIGH (ref 70–99)
Potassium: 4.8 mmol/L (ref 3.5–5.1)
Sodium: 137 mmol/L (ref 135–145)
Total Bilirubin: 0.5 mg/dL (ref 0.3–1.2)
Total Protein: 6.8 g/dL (ref 6.5–8.1)

## 2020-01-28 MED ORDER — OXYCODONE HCL 5 MG PO TABS: 5.0000 mg | ORAL_TABLET | Freq: Three times a day (TID) | ORAL | 0 refills | Status: DC | PRN

## 2020-01-28 NOTE — Progress Notes (Signed)
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 He was recently found to have iron deficiency anemia which led to a colonoscopy On 07/15/2018. Colonoscopy showed a villous partially obstructing medium-sized mass in the mid sigmoid colon. The mass was partially circumferential measuring 5 cm. There was another infiltrative partially obstructing medium-sized mass found in the rectosigmoid colon which also measured 5 cm. Pathology from the sigmoid colon mass showed high-grade dysplasia at least involving an adenomatous lesion with villous features. A more serious process is not excluded. Rectosigmoid mass biopsy showed invasive colorectal adenocarcinoma.  Patient was referred to Dr. [name] for surgical management. Patient had a CT abdomen and pelvis with contrast which showed irregular asymmetric mural thickening of the proximal rectum measuring over 6 to 7 cm. Potential local nodal metastases with several subcentimeter iliac lymph nodes and presacral lymph nodes. No evidence of metastatic disease outside the pelvis.  Patient also had MRI of the pelvis with and without contrast which showed tumor length 8.5 cm with extension through muscularis propria decreased C.  There is diffuse involvement of muscularis propria. No extramural vascular invasion or tumor thrombus. Shortest distance of any tumor/note from mesorectal fascia 1 to 2 mm. No involvement of adjacent organs or pelvic sidewall. Mesorectal lymph nodes within perirectal fat including 6 mm node and a low sigmoid mesocolon node measuring 6 mm. Another 6 mm node within the low sigmoid mesocolon representing extremity rectal lymphadenopathy. T3N2 by MRI. Distance from tumor to internal anal sphincter is 6.2 cm.  Patient completed 8 cycles of neoadjuvant FOLFOX chemotherapy on 12/07/2018. Interim scans showed similar-appearing irregular wall thickening of the rectum but no retroperitoneal pelvic adenopathy.Patient completed concurrent chemoradiation end of November 2020.  Patient had partial colectomy with a colostomy at [facility] in March 2021. Final pathology showed invasive adenocarcinoma 6 cm grade 2. Tumor deposit present. Tumor invades through muscularis propria into pericolorectal tissue. Lymphovascular invasion present. Perineural invasion absent. Treatment effect absent. Extensive residual cancer with no evident tumor regression. Poor to no response. Margins negative. 1 out of 39 lymph nodes positive. [id number]  Interval history-patient reports doing well.  Appetite and weight have remained stable.  Colostomy is functioning well.  Denies any blood in his stools.  He has intermittent abdominal pain secondary to surgery.  He does report some pain in his right arm which has been ongoing over the last couple weeks  ECOG PS- 1 Pain scale- 3   Review of systems- Review of Systems  Constitutional: Negative for chills, fever, malaise/fatigue and weight loss.  HENT: Negative for congestion, ear discharge and nosebleeds.   Eyes: Negative for blurred vision.  Respiratory: Negative for cough, hemoptysis, sputum production, shortness of breath and wheezing.   Cardiovascular: Negative for chest  pain, palpitations, orthopnea and claudication.  Gastrointestinal: Positive for abdominal pain. Negative for blood in stool, constipation, diarrhea, heartburn, melena, nausea and vomiting.  Genitourinary: Negative for dysuria, flank pain, frequency, hematuria and urgency.  Musculoskeletal: Negative for back pain, joint pain and myalgias.  Skin: Negative for rash.  Neurological: Negative for dizziness, tingling, focal weakness, seizures, weakness and headaches.  Endo/Heme/Allergies: Does not bruise/bleed easily.  Psychiatric/Behavioral: Negative for depression and suicidal ideas. The patient does not have insomnia.       No Known Allergies   Past Medical History:  Diagnosis Date  . Anemia   . Coronary artery disease   . Diabetes mellitus without complication ([place])   . History of hiatal hernia   . Hypercholesterolemia   . Hypertension   . Prostate cancer ([place]) 2000   Prostatectomy.   . 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[place]) 09/2018   Chemo tx's.   . Wears dentures    partial lower     Past Surgical History:  Procedure Laterality Date  . CATARACT EXTRACTION W/PHACO Right 10/12/2019   Procedure: CATARACT EXTRACTION PHACO AND INTRAOCULAR LENS PLACEMENT (IOC) RIGHT DIABETIC 6.35 00:37.1;  Surgeon: Birder Robson, MD;  Location: [facility];  Service: Ophthalmology;  Laterality: Right;  . COLONOSCOPY WITH PROPOFOL N/A 07/15/2018   Procedure: COLONOSCOPY WITH PROPOFOL;  Surgeon: [name], [name], MD;  Location: [facility] ENDOSCOPY;  Service: Gastroenterology;  Laterality: N/A;  . ESOPHAGOGASTRODUODENOSCOPY N/A 10/12/2018   Procedure: ESOPHAGOGASTRODUODENOSCOPY (EGD);  Surgeon: [name], MD;  Location: [facility] ENDOSCOPY;  Service: Gastroenterology;  Laterality: N/A;  . ESOPHAGOGASTRODUODENOSCOPY (EGD) WITH PROPOFOL N/A 07/15/2018   Procedure: ESOPHAGOGASTRODUODENOSCOPY (EGD) WITH PROPOFOL;  Surgeon: [name], [name], MD;  Location: [facility] ENDOSCOPY;  Service: Gastroenterology;  Laterality: N/A;  .  PORTACATH PLACEMENT N/A 08/13/2018   Procedure: INSERTION PORT-A-CATH;  Surgeon: [name], MD;  Location: [facility] ORS;  Service: General;  Laterality: N/A;  . PROSTATE SURGERY    . ROBOT ASSISTED LAPAROSCOPIC PARTIAL COLECTOMY  05/12/2019   [place]    Social History   Socioeconomic History  . Marital status: Widowed    Spouse name: Not on file  . Number of children: 0  . Years of education: Not on file  . Highest education level: Not on file  Occupational History  . Occupation: Pharmacist, hospital    Comment: Retired  Tobacco Use  . Smoking status: Never Smoker  . Smokeless tobacco: Never Used  Vaping Use  . Vaping Use: Never used  Substance and Sexual Activity  . Alcohol use: Not Currently  . Drug use: Never  . Sexual activity: Not Currently  Other Topics Concern  . 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  His wife was a Camera operator and they traveled frequently.  Several nieces and nephews.  He is the youngest of his brothers and sisters and only surviving.   Social Determinants of Health   Financial Resource Strain:   . Difficulty of Paying Living Expenses: Not on file  Food Insecurity:   . Worried About Charity fundraiser in the Last Year: Not on file  . Ran Out of Food in the Last Year: Not on file  Transportation Needs:   . Lack of Transportation (Medical): Not on file  . Lack of Transportation (Non-Medical): Not on file  Physical Activity:   . Days of Exercise per Week: Not on file  . Minutes of Exercise per Session: Not on file  Stress:   . Feeling of Stress : Not on file  Social Connections:   .  Frequency of Communication with Friends and Family: Not on file  . Frequency of Social Gatherings with Friends and Family: Not on file  . Attends Religious Services: Not on file  . Active Member of Clubs or Organizations: Not on file    . Attends Archivist Meetings: Not on file  . Marital Status: Not on file  Intimate Partner Violence:   . Fear of Current or Ex-Partner: Not on file  . Emotionally Abused: Not on file  . Physically Abused: Not on file  . Sexually Abused: Not on file    No family history on file.   Current Outpatient Medications:  .  metoprolol succinate (TOPROL-XL) 50 MG 24 hr tablet, Take 1 tablet by mouth 1 day or 1 dose., Disp: , Rfl:  .  pantoprazole (PROTONIX) 40 MG tablet, Take 1 tablet (40 mg total) by mouth 2 (two) times daily before a meal. (Patient taking differently: Take 40 mg by mouth daily. ), Disp: 60 tablet, Rfl: 0 .  ROCKLATAN 0.02-0.005 % SOLN, , Disp: , Rfl:  .  SIMBRINZA 1-0.2 % SUSP, , Disp: , Rfl:  .  tamsulosin (FLOMAX) 0.4 MG CAPS capsule, Take 0.4 mg by mouth., Disp: , Rfl:  .  traZODone (DESYREL) 50 MG tablet, Take 50 mg by mouth at bedtime as needed for sleep. , Disp: , Rfl:  .  NOVOLOG MIX 70/30 FLEXPEN (70-30) 100 UNIT/ML FlexPen, , Disp: , Rfl:  .  oxyCODONE (OXY IR/ROXICODONE) 5 MG immediate release tablet, Take 1 tablet (5 mg total) by mouth every 8 (eight) hours as needed for severe pain., Disp: 90 tablet, Rfl: 0 .  rosuvastatin (CRESTOR) 10 MG tablet, , Disp: , Rfl:  No current facility-administered medications for this visit.  Facility-Administered Medications Ordered in Other Visits:  .  heparin lock flush 100 unit/mL, 500 Units, Intravenous, Once, Sindy Guadeloupe, MD .  sodium chloride flush (NS) 0.9 % injection 10 mL, 10 mL, Intravenous, Once, Sindy Guadeloupe, MD .  sodium chloride flush (NS) 0.9 % injection 10 mL, 10 mL, Intravenous, Once, Sindy Guadeloupe, MD .  sodium chloride flush (NS) 0.9 % injection 10 mL, 10 mL, Intravenous, PRN, Sindy Guadeloupe, MD  Physical exam:  Vitals:   01/28/20 0952  BP: (!) 147/64  Pulse: (!) 54  Resp: 18  Temp: (!) 97.1 F (36.2 C)  TempSrc: Tympanic  SpO2: 98%  Weight: 146 lb 3.2 oz (66.3 kg)   Physical  Exam Constitutional:      General: He is not in acute distress. Cardiovascular:     Rate and Rhythm: Normal rate and regular rhythm.     Heart sounds: Normal heart sounds.  Pulmonary:     Effort: Pulmonary effort is normal.     Breath sounds: Normal breath sounds.  Abdominal:     General: Bowel sounds are normal.     Palpations: Abdomen is soft.     Comments: Colostomy in place which is functioning well  Skin:    General: Skin is warm and dry.  Neurological:     Mental Status: He is alert and oriented to person, place, and time.      CMP Latest Ref Rng & Units 01/28/2020  Glucose 70 - 99 mg/dL 156(H)  BUN 8 - 23 mg/dL 27(H)  Creatinine 0.61 - 1.24 mg/dL 1.27(H)  Sodium 135 - 145 mmol/L 137  Potassium 3.5 - 5.1 mmol/L 4.8  Chloride 98 - 111 mmol/L 102  CO2 22 - 32  mmol/L 26  Calcium 8.9 - 10.3 mg/dL 9.3  Total Protein 6.5 - 8.1 g/dL 6.8  Total Bilirubin 0.3 - 1.2 mg/dL 0.5  Alkaline Phos 38 - 126 U/L 68  AST 15 - 41 U/L 23  ALT 0 - 44 U/L 25   CBC Latest Ref Rng & Units 01/28/2020  WBC 4.0 - 10.5 K/uL 2.7(L)  Hemoglobin 13.0 - 17.0 g/dL 12.7(L)  Hematocrit 39 - 52 % 37.4(L)  Platelets 150 - 400 K/uL 184    Assessment and plan- Patient is a 79 y.o. male  with stage III rectal adenocarcinoma s/p total neoadjuvant treatment with FOLFOX chemotherapy 4 months followed by concurrent chemoradiation. Final pathology showed poor response to treatment [id number].   This is a routine follow-up visit for rectal cancer  Clinically patient is doing well with no concerning signs and symptoms of recurrence based on today's exam.  He will be scheduled for CT chest abdomen and pelvis next month.  He does have mild neutropenia since he completed chemotherapy which is essentially stable.  Hemoglobin and platelets are normal  Patient has some intermittent chronic abdominal pain since his surgery and I explained to him that it would not be possible for me to give him long-term oxycodone for  this.  I have renewed his prescription for this time but moving forward he will have to try to come off oxycodone and try as needed Tylenol instead  I will see him back in 4 months with CBC with differential CMP and CEA   Visit Diagnosis 1. Encounter for follow-up surveillance of rectal cancer      Dr. Randa Evens, MD, MPH [facility] [phone] 01/28/2020 11:49 AM

## 2020-01-28 NOTE — Progress Notes (Signed)
Radiation Oncology Follow up Note  Name: Willie [name]   Date:   01/28/2020 MRN:  [id number] DOB: 1940-07-16    This 79 y.o. male presents to the clinic today for 20-month follow-up status post concurrent chemoradiation therapy for stage IIIb (T3 N2 M0 status post TNT with concurrent chemoradiation therapy followed by definitive surgery.  REFERRING PROVIDER: [name], MD  HPI: Patient is an 79 year old male now out 10 months having completed concurrent chemoradiation therapy in a neoadjuvant fashion then definitive surgery at [facility] for stage IIIb rectal adenocarcinoma.  At the time of surgery mass was still 6 cm with lymph vascular periinvasion present.  Margins of resection were clear only 1 of 39 lymph nodes was involved with metastatic disease.  He had extensive residual cancer with no evidence of tumor regression.  He does have a functioning colostomy.  He had a CT scan of the chest abdomen pelvis showing no evidence of mass metastatic disease to the chest abdomen or pelvis back in July.  He is scheduled for another CT scan in January.  He is doing well he states his colostomy is functioning well he is having no increased lower urinary tract symptoms or pelvic pain.  COMPLICATIONS OF TREATMENT: none  FOLLOW UP COMPLIANCE: keeps appointments   PHYSICAL EXAM:  There were no vitals taken for this visit. Patient has a functioning colostomy.  Well-developed well-nourished patient in NAD. HEENT reveals PERLA, EOMI, discs not visualized.  Oral cavity is clear. No oral mucosal lesions are identified. Neck is clear without evidence of cervical or supraclavicular adenopathy. Lungs are clear to A&P. Cardiac examination is essentially unremarkable with regular rate and rhythm without murmur rub or thrill. Abdomen is benign with no organomegaly or masses noted. Motor sensory and DTR levels are equal and symmetric in the upper and lower extremities. Cranial nerves II through XII are grossly intact.  Proprioception is intact. No peripheral adenopathy or edema is identified. No motor or sensory levels are noted. Crude visual fields are within normal range.  RADIOLOGY RESULTS: CT scans are reviewed compatible with above-stated findings  PLAN: The present time patient is doing well with no evidence of disease now out 10 months.  I anticipate seeing his CT scans in January.  I have asked him to see him back in 6 months for follow-up.  He continues to do well with no evidence of disease at this time.  He continues close follow-up care with medical oncology.  Patient knows to call with any concerns.  I would like to take this opportunity to thank you for allowing me to participate in the care of your patient.Noreene Filbert, MD

## 2020-01-29 LAB — CEA: CEA: 1 ng/mL (ref 0.0–4.7)

## 2020-03-31 ENCOUNTER — Ambulatory Visit: Payer: Medicare PPO

## 2020-04-11 ENCOUNTER — Other Ambulatory Visit: Payer: Self-pay

## 2020-04-11 ENCOUNTER — Ambulatory Visit
Admission: RE | Admit: 2020-04-11 | Discharge: 2020-04-11 | Disposition: A | Discharge status: Home / Self Care | Payer: Medicare PPO | Source: Ambulatory Visit | Attending: Oncology | Admitting: Oncology

## 2020-04-11 DIAGNOSIS — Z85048 Personal history of other malignant neoplasm of rectum, rectosigmoid junction, and anus: Secondary | ICD-10-CM | POA: Diagnosis present

## 2020-04-11 DIAGNOSIS — Z08 Encounter for follow-up examination after completed treatment for malignant neoplasm: Secondary | ICD-10-CM | POA: Diagnosis not present

## 2020-04-11 LAB — POCT I-STAT CREATININE: Creatinine, Ser: 1.3 mg/dL — ABNORMAL HIGH (ref 0.61–1.24)

## 2020-04-11 MED ORDER — IOHEXOL 300 MG/ML  SOLN
100.0000 mL | Freq: Once | INTRAMUSCULAR | Status: AC | PRN
  Administered 2020-04-11: 100 mL via INTRAVENOUS

## 2020-05-26 ENCOUNTER — Inpatient Hospital Stay: Payer: Medicare PPO | Attending: Oncology | Admitting: Oncology

## 2020-05-26 ENCOUNTER — Inpatient Hospital Stay: Payer: Medicare PPO

## 2020-05-26 ENCOUNTER — Other Ambulatory Visit: Payer: Self-pay

## 2020-05-26 ENCOUNTER — Encounter: Payer: Self-pay | Admitting: Oncology

## 2020-05-26 VITALS — BP 155/69 | HR 74 | Temp 96.1°F | Resp 18 | Wt 153.5 lb

## 2020-05-26 DIAGNOSIS — Z08 Encounter for follow-up examination after completed treatment for malignant neoplasm: Secondary | ICD-10-CM

## 2020-05-26 DIAGNOSIS — I10 Essential (primary) hypertension: Secondary | ICD-10-CM | POA: Insufficient documentation

## 2020-05-26 DIAGNOSIS — Z79899 Other long term (current) drug therapy: Secondary | ICD-10-CM | POA: Diagnosis not present

## 2020-05-26 DIAGNOSIS — E785 Hyperlipidemia, unspecified: Secondary | ICD-10-CM | POA: Insufficient documentation

## 2020-05-26 DIAGNOSIS — Z923 Personal history of irradiation: Secondary | ICD-10-CM | POA: Diagnosis not present

## 2020-05-26 DIAGNOSIS — D72819 Decreased white blood cell count, unspecified: Secondary | ICD-10-CM | POA: Diagnosis not present

## 2020-05-26 DIAGNOSIS — C20 Malignant neoplasm of rectum: Secondary | ICD-10-CM | POA: Diagnosis not present

## 2020-05-26 DIAGNOSIS — E119 Type 2 diabetes mellitus without complications: Secondary | ICD-10-CM | POA: Insufficient documentation

## 2020-05-26 DIAGNOSIS — Z85048 Personal history of other malignant neoplasm of rectum, rectosigmoid junction, and anus: Secondary | ICD-10-CM

## 2020-05-26 DIAGNOSIS — E7800 Pure hypercholesterolemia, unspecified: Secondary | ICD-10-CM | POA: Insufficient documentation

## 2020-05-26 DIAGNOSIS — I2510 Atherosclerotic heart disease of native coronary artery without angina pectoris: Secondary | ICD-10-CM | POA: Diagnosis not present

## 2020-05-26 DIAGNOSIS — Z9221 Personal history of antineoplastic chemotherapy: Secondary | ICD-10-CM | POA: Insufficient documentation

## 2020-05-26 LAB — CBC WITH DIFFERENTIAL/PLATELET
Abs Immature Granulocytes: 0.01 10*3/uL (ref 0.00–0.07)
Basophils Absolute: 0 10*3/uL (ref 0.0–0.1)
Basophils Relative: 1 %
Eosinophils Absolute: 0.1 10*3/uL (ref 0.0–0.5)
Eosinophils Relative: 2 %
HCT: 38.8 % — ABNORMAL LOW (ref 39.0–52.0)
Hemoglobin: 12.8 g/dL — ABNORMAL LOW (ref 13.0–17.0)
Immature Granulocytes: 0 %
Lymphocytes Relative: 31 %
Lymphs Abs: 1 10*3/uL (ref 0.7–4.0)
MCH: 30.3 pg (ref 26.0–34.0)
MCHC: 33 g/dL (ref 30.0–36.0)
MCV: 91.9 fL (ref 80.0–100.0)
Monocytes Absolute: 0.4 10*3/uL (ref 0.1–1.0)
Monocytes Relative: 12 %
Neutro Abs: 1.7 10*3/uL (ref 1.7–7.7)
Neutrophils Relative %: 54 %
Platelets: 174 10*3/uL (ref 150–400)
RBC: 4.22 MIL/uL (ref 4.22–5.81)
RDW: 14.1 % (ref 11.5–15.5)
WBC: 3.1 10*3/uL — ABNORMAL LOW (ref 4.0–10.5)
nRBC: 0 % (ref 0.0–0.2)

## 2020-05-26 LAB — COMPREHENSIVE METABOLIC PANEL
ALT: 23 U/L (ref 0–44)
AST: 18 U/L (ref 15–41)
Albumin: 3.7 g/dL (ref 3.5–5.0)
Alkaline Phosphatase: 55 U/L (ref 38–126)
Anion gap: 7 (ref 5–15)
BUN: 23 mg/dL (ref 8–23)
CO2: 25 mmol/L (ref 22–32)
Calcium: 9 mg/dL (ref 8.9–10.3)
Chloride: 106 mmol/L (ref 98–111)
Creatinine, Ser: 1.26 mg/dL — ABNORMAL HIGH (ref 0.61–1.24)
GFR, Estimated: 57 mL/min — ABNORMAL LOW (ref >60)
Glucose, Bld: 138 mg/dL — ABNORMAL HIGH (ref 70–99)
Potassium: 4.4 mmol/L (ref 3.5–5.1)
Sodium: 138 mmol/L (ref 135–145)
Total Bilirubin: 0.6 mg/dL (ref 0.3–1.2)
Total Protein: 7 g/dL (ref 6.5–8.1)

## 2020-05-26 NOTE — Progress Notes (Signed)
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 He was recently found to have iron deficiency anemia which led to a colonoscopy On 07/15/2018. Colonoscopy showed a villous partially obstructing medium-sized mass in the mid sigmoid colon. The mass was partially circumferential measuring 5 cm. There was another infiltrative partially obstructing medium-sized mass found in the rectosigmoid colon which also measured 5 cm. Pathology from the sigmoid colon mass showed high-grade dysplasia at least involving an adenomatous lesion with villous features. A more serious process is not excluded. Rectosigmoid mass biopsy showed invasive colorectal adenocarcinoma.  Patient was referred to Dr. [name] for surgical management. Patient had a CT abdomen and pelvis with contrast which showed irregular asymmetric mural thickening of the proximal rectum measuring over 6 to 7 cm. Potential local nodal metastases with several subcentimeter iliac lymph nodes and presacral lymph nodes. No evidence of metastatic disease outside the pelvis.  Patient also had MRI of the pelvis with and without contrast which showed tumor length 8.5 cm with extension through muscularis propria decreased C. There  is diffuse involvement of muscularis propria. No extramural vascular invasion or tumor thrombus. Shortest distance of any tumor/note from mesorectal fascia 1 to 2 mm. No involvement of adjacent organs or pelvic sidewall. Mesorectal lymph nodes within perirectal fat including 6 mm node and a low sigmoid mesocolon node measuring 6 mm. Another 6 mm node within the low sigmoid mesocolon representing extremity rectal lymphadenopathy. T3N2 by MRI. Distance from tumor to internal anal sphincter is 6.2 cm.  Patient completed 8 cycles of neoadjuvant FOLFOX chemotherapy on 12/07/2018. Interim scans showed similar-appearing irregular wall thickening of the rectum but no retroperitoneal pelvic adenopathy.Patient completed concurrent chemoradiation end of November 2020.  Patient had partial colectomy with a colostomy at [facility] March 2021. Final pathology showed invasive adenocarcinoma 6 cm grade 2. Tumor deposit present. Tumor invades through muscularis propria into pericolorectal tissue. Lymphovascular invasion present. Perineural invasion absent. Treatment effect absent. Extensive residual cancer with no evident tumor regression. Poor to no response. Margins negative. 1 out of 39 lymph nodes positive. [id number]   Interval history-patient reports doing well and denies any complaints at this time  ECOG PS- 1 Pain scale- 0  Review of systems- Review of Systems  Constitutional: Negative for chills, fever, malaise/fatigue and weight loss.  HENT: Negative for congestion, ear discharge and nosebleeds.   Eyes: Negative for blurred vision.  Respiratory: Negative for cough, hemoptysis, sputum production, shortness of breath and wheezing.   Cardiovascular: Negative for chest pain, palpitations, orthopnea and claudication.  Gastrointestinal: Negative for abdominal pain, blood in stool, constipation, diarrhea, heartburn, melena, nausea and vomiting.  Genitourinary: Negative for dysuria, flank pain,  frequency, hematuria and urgency.  Musculoskeletal: Negative for back pain, joint pain and myalgias.  Skin: Negative for rash.  Neurological: Negative for dizziness, tingling, focal weakness, seizures, weakness and headaches.  Endo/Heme/Allergies: Does not bruise/bleed easily.  Psychiatric/Behavioral: Negative for depression and suicidal ideas. The patient does not have insomnia.        No Known Allergies   Past Medical History:  Diagnosis Date  . Anemia   . Coronary artery disease   . Diabetes mellitus without complication ([place])   . History of hiatal hernia   . Hypercholesterolemia   . Hypertension   . Prostate cancer ([place]) 2000   Prostatectomy.   . 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[place]) 09/2018   Chemo tx's.   . Wears dentures    partial lower     Past Surgical History:  Procedure Laterality Date  . CATARACT EXTRACTION W/PHACO Right 10/12/2019   Procedure: CATARACT EXTRACTION PHACO AND INTRAOCULAR LENS PLACEMENT (IOC) RIGHT DIABETIC 6.35 00:37.1;  Surgeon: Birder Robson, MD;  Location: [facility];  Service: Ophthalmology;  Laterality: Right;  . COLONOSCOPY WITH PROPOFOL N/A 07/15/2018   Procedure: COLONOSCOPY WITH PROPOFOL;  Surgeon: [name], [name], MD;  Location: [facility] ENDOSCOPY;  Service: Gastroenterology;  Laterality: N/A;  . ESOPHAGOGASTRODUODENOSCOPY N/A 10/12/2018   Procedure: ESOPHAGOGASTRODUODENOSCOPY (EGD);  Surgeon: [name], MD;  Location[facility] ENDOSCOPY;  Service: Gastroenterology;  Laterality: N/A;  . ESOPHAGOGASTRODUODENOSCOPY (EGD) WITH PROPOFOL N/A 07/15/2018   Procedure: ESOPHAGOGASTRODUODENOSCOPY (EGD) WITH PROPOFOL;  Surgeon: [name], [name], MD;  Location: [facility] ENDOSCOPY;  Service: Gastroenterology;  Laterality: N/A;  . PORTACATH PLACEMENT N/A 08/13/2018   Procedure: INSERTION PORT-A-CATH;  Surgeon: [name], MD;  Location: [facility] ORS;  Service: General;  Laterality: N/A;  . PROSTATE SURGERY    . ROBOT ASSISTED LAPAROSCOPIC PARTIAL COLECTOMY   05/12/2019   [place]    Social History   Socioeconomic History  . Marital status: Widowed    Spouse name: Not on file  . Number of children: 0  . Years of education: Not on file  . Highest education level: Not on file  Occupational History  . Occupation: Pharmacist, hospital    Comment: Retired  Tobacco Use  . Smoking status: Never Smoker  . Smokeless tobacco: Never Used  Vaping Use  . Vaping Use: Never used  Substance and Sexual Activity  . Alcohol use: Not Currently  . Drug use: Never  . Sexual activity: Not Currently  Other Topics Concern  . 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  His wife was a Camera operator and they traveled frequently.  Several nieces and nephews.  He is the youngest of his brothers and sisters and only surviving.   Social Determinants of Health   Financial Resource Strain: Not on file  Food Insecurity: Not on file  Transportation Needs: Not on file  Physical Activity: Not on file  Stress: Not on file  Social Connections: Not on file  Intimate Partner Violence: Not on file    No family history on file.   Current Outpatient Medications:  .  methocarbamol (ROBAXIN) 500 MG tablet, Take by mouth., Disp: , Rfl:  .  metoprolol succinate (TOPROL-XL) 50 MG 24 hr tablet, Take 1 tablet by mouth 1 day or 1 dose., Disp: , Rfl:  .  NOVOLOG MIX 70/30 FLEXPEN (70-30) 100 UNIT/ML FlexPen, , Disp: , Rfl:  .  pantoprazole (PROTONIX) 40 MG tablet, Take 1 tablet (40 mg total) by mouth 2 (two) times daily before a meal. (Patient taking differently: Take 40 mg by mouth daily.), Disp: 60 tablet, Rfl: 0 .  ROCKLATAN 0.02-0.005 % SOLN, ,  Disp: , Rfl:  .  rosuvastatin (CRESTOR) 10 MG tablet, , Disp: , Rfl:  .  SIMBRINZA 1-0.2 % SUSP, , Disp: , Rfl:  .  tamsulosin (FLOMAX) 0.4 MG CAPS capsule, Take 0.4 mg by mouth., Disp: , Rfl:  .  traZODone  (DESYREL) 50 MG tablet, Take 50 mg by mouth at bedtime as needed for sleep. , Disp: , Rfl:  .  oxyCODONE (OXY IR/ROXICODONE) 5 MG immediate release tablet, Take 1 tablet (5 mg total) by mouth every 8 (eight) hours as needed for severe pain. (Patient not taking: Reported on 05/26/2020), Disp: 90 tablet, Rfl: 0 No current facility-administered medications for this visit.  Facility-Administered Medications Ordered in Other Visits:  .  heparin lock flush 100 unit/mL, 500 Units, Intravenous, Once, Sindy Guadeloupe, MD .  sodium chloride flush (NS) 0.9 % injection 10 mL, 10 mL, Intravenous, Once, Sindy Guadeloupe, MD .  sodium chloride flush (NS) 0.9 % injection 10 mL, 10 mL, Intravenous, Once, Sindy Guadeloupe, MD .  sodium chloride flush (NS) 0.9 % injection 10 mL, 10 mL, Intravenous, PRN, Sindy Guadeloupe, MD  Physical exam:  Vitals:   05/26/20 1002  BP: (!) 155/69  Pulse: 74  Resp: 18  Temp: (!) 96.1 F (35.6 C)  TempSrc: Tympanic  SpO2: 100%  Weight: 153 lb 8 oz (69.6 kg)   Physical Exam Cardiovascular:     Rate and Rhythm: Normal rate and regular rhythm.     Heart sounds: Normal heart sounds.  Pulmonary:     Effort: Pulmonary effort is normal.     Breath sounds: Normal breath sounds.  Abdominal:     General: Bowel sounds are normal. There is no distension.     Palpations: Abdomen is soft.     Tenderness: There is no abdominal tenderness.     Comments: Colostomy in place  Skin:    General: Skin is warm and dry.  Neurological:     Mental Status: He is alert and oriented to person, place, and time.      CMP Latest Ref Rng & Units 05/26/2020  Glucose 70 - 99 mg/dL 138(H)  BUN 8 - 23 mg/dL 23  Creatinine 0.61 - 1.24 mg/dL 1.26(H)  Sodium 135 - 145 mmol/L 138  Potassium 3.5 - 5.1 mmol/L 4.4  Chloride 98 - 111 mmol/L 106  CO2 22 - 32 mmol/L 25  Calcium 8.9 - 10.3 mg/dL 9.0  Total Protein 6.5 - 8.1 g/dL 7.0  Total Bilirubin 0.3 - 1.2 mg/dL 0.6  Alkaline Phos 38 - 126 U/L 55  AST 15  - 41 U/L 18  ALT 0 - 44 U/L 23   CBC Latest Ref Rng & Units 05/26/2020  WBC 4.0 - 10.5 K/uL 3.1(L)  Hemoglobin 13.0 - 17.0 g/dL 12.8(L)  Hematocrit 39.0 - 52.0 % 38.8(L)  Platelets 150 - 400 K/uL 174      Assessment and plan- Patient is a 80 y.o. male with stage III rectal adenocarcinoma s/p total neoadjuvant treatment with FOLFOX chemotherapy 4 months followed by concurrent chemoradiation. Final pathology showed poor response to [id number].   This is a routine follow-up visit for rectal cancer  Clinically patient is doing well with no evidence of recurrence based on today's exam.  Recent CT chest abdomen pelvis with contrast showed no evidence of progressive malignancy.  Colostomy is functioning well and patient reports no new complaints at this time.  I will see him back in 6 months with CBC with differential CMP  and CEA and he will get a CT scan prior.  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  Continue to monitor   Visit Diagnosis 1. Encounter for follow-up surveillance of rectal cancer      Dr. Randa Evens, MD, MPH [facility] [phone] 05/26/2020 2:51 PM

## 2020-05-27 LAB — CEA: CEA: 0.6 ng/mL (ref 0.0–4.7)

## 2020-07-27 ENCOUNTER — Ambulatory Visit
Admission: RE | Admit: 2020-07-27 | Discharge: 2020-07-27 | Disposition: A | Discharge status: Home / Self Care | Payer: Medicare PPO | Source: Ambulatory Visit | Attending: Radiation Oncology | Admitting: Radiation Oncology

## 2020-07-27 VITALS — BP 153/73 | HR 58 | Temp 98.1°F | Resp 12 | Wt 160.6 lb

## 2020-07-27 DIAGNOSIS — Z923 Personal history of irradiation: Secondary | ICD-10-CM | POA: Diagnosis not present

## 2020-07-27 DIAGNOSIS — Z9221 Personal history of antineoplastic chemotherapy: Secondary | ICD-10-CM | POA: Diagnosis not present

## 2020-07-27 DIAGNOSIS — C20 Malignant neoplasm of rectum: Secondary | ICD-10-CM

## 2020-07-27 NOTE — Progress Notes (Signed)
Radiation Oncology Follow up Note  Name: Willie [name]   Date:   07/27/2020 MRN:  [id number] DOB: 08-12-1940    This 80 y.o. male presents to the clinic today for 45-month follow-up status post concurrent chemoradiation therapy for stage IIIb (T3 N2 M0) adenocarcinoma of the rectum status post TNT with concurrent chemoradiation therapy followed by definitive surgery.Marland Kitchen  REFERRING PROVIDER: [name], MD  HPI: Patient is an 80 year old male now out 16 months having completed concurrent chemoradiation therapy in a neoadjuvant fashion for stage IIIb adenocarcinoma of the rectum.  He only had 1 of 39 no lymph nodes present at time of surgery although there was still a 6 cm mass with lymph vascular invasion.  He is seen today in routine follow-up is doing well.Marland Kitchen  His CEA continues to decline.  He is having no abdominal pain colostomy is functioning well.  He had repeat CT scans back in February which showed postsurgical changes no evidence of metastatic disease or recurrence in his pelvis.  All findings are considered to represent posttreatment postsurgical changes.  He did have a subtle hypodense lesion along the dome of the left hepatic lobe which is not seen on February scans.  He has repeat CT scans ordered for September.  COMPLICATIONS OF TREATMENT: none  FOLLOW UP COMPLIANCE: keeps appointments   PHYSICAL EXAM:  BP (!) 153/73   Pulse (!) 58   Temp 98.1 F (36.7 C) (Tympanic)   Resp 12   Wt 160 lb 9.6 oz (72.8 kg)   BMI 28.45 kg/m  Patient is a functioning colostomy.  Abdomen is benign.  Well-developed well-nourished patient in NAD. HEENT reveals PERLA, EOMI, discs not visualized.  Oral cavity is clear. No oral mucosal lesions are identified. Neck is clear without evidence of cervical or supraclavicular adenopathy. Lungs are clear to A&P. Cardiac examination is essentially unremarkable with regular rate and rhythm without murmur rub or thrill. Abdomen is benign with no organomegaly  or masses noted. Motor sensory and DTR levels are equal and symmetric in the upper and lower extremities. Cranial nerves II through XII are grossly intact. Proprioception is intact. No peripheral adenopathy or edema is identified. No motor or sensory levels are noted. Crude visual fields are within normal range.  RADIOLOGY RESULTS: CT scans of chest abdomen pelvis all reviewed compatible with above-stated findings  PLAN: Present time patient is doing well.  CEA continues to decline which is excellent intake indication of treatment response.  I am pleased with his overall progress.  I have asked to see him back in 6 months for follow-up.  Patient knows to call with any concerns he continues close follow-up care with medical oncology.  I would like to take this opportunity to thank you for allowing me to participate in the care of your patient.Noreene Filbert, MD

## 2020-11-15 ENCOUNTER — Ambulatory Visit
Admission: RE | Admit: 2020-11-15 | Discharge: 2020-11-15 | Disposition: A | Discharge status: Home / Self Care | Payer: Medicare PPO | Source: Ambulatory Visit | Attending: Oncology | Admitting: Oncology

## 2020-11-15 DIAGNOSIS — Z85048 Personal history of other malignant neoplasm of rectum, rectosigmoid junction, and anus: Secondary | ICD-10-CM | POA: Insufficient documentation

## 2020-11-15 DIAGNOSIS — Z08 Encounter for follow-up examination after completed treatment for malignant neoplasm: Secondary | ICD-10-CM | POA: Diagnosis present

## 2020-11-15 LAB — POCT I-STAT CREATININE: Creatinine, Ser: 1.2 mg/dL (ref 0.61–1.24)

## 2020-11-15 MED ORDER — IOHEXOL 350 MG/ML SOLN
80.0000 mL | Freq: Once | INTRAVENOUS | Status: AC | PRN
  Administered 2020-11-15: 80 mL via INTRAVENOUS

## 2020-11-17 ENCOUNTER — Encounter: Payer: Self-pay | Admitting: Oncology

## 2020-11-17 ENCOUNTER — Inpatient Hospital Stay: Payer: Medicare PPO | Admitting: Oncology

## 2020-11-17 ENCOUNTER — Inpatient Hospital Stay: Payer: Medicare PPO | Attending: Oncology

## 2020-11-17 ENCOUNTER — Other Ambulatory Visit: Payer: Self-pay | Admitting: *Deleted

## 2020-11-17 VITALS — BP 116/64 | HR 52 | Temp 96.2°F | Resp 20 | Wt 164.1 lb

## 2020-11-17 DIAGNOSIS — I10 Essential (primary) hypertension: Secondary | ICD-10-CM | POA: Diagnosis not present

## 2020-11-17 DIAGNOSIS — Z08 Encounter for follow-up examination after completed treatment for malignant neoplasm: Secondary | ICD-10-CM

## 2020-11-17 DIAGNOSIS — C19 Malignant neoplasm of rectosigmoid junction: Secondary | ICD-10-CM | POA: Diagnosis present

## 2020-11-17 DIAGNOSIS — E119 Type 2 diabetes mellitus without complications: Secondary | ICD-10-CM | POA: Diagnosis not present

## 2020-11-17 DIAGNOSIS — Z85048 Personal history of other malignant neoplasm of rectum, rectosigmoid junction, and anus: Secondary | ICD-10-CM | POA: Diagnosis not present

## 2020-11-17 DIAGNOSIS — C20 Malignant neoplasm of rectum: Secondary | ICD-10-CM

## 2020-11-17 LAB — CBC WITH DIFFERENTIAL/PLATELET
Abs Immature Granulocytes: 0.02 10*3/uL (ref 0.00–0.07)
Basophils Absolute: 0 10*3/uL (ref 0.0–0.1)
Basophils Relative: 1 %
Eosinophils Absolute: 0.1 10*3/uL (ref 0.0–0.5)
Eosinophils Relative: 3 %
HCT: 41.6 % (ref 39.0–52.0)
Hemoglobin: 14 g/dL (ref 13.0–17.0)
Immature Granulocytes: 1 %
Lymphocytes Relative: 32 %
Lymphs Abs: 1 10*3/uL (ref 0.7–4.0)
MCH: 30.2 pg (ref 26.0–34.0)
MCHC: 33.7 g/dL (ref 30.0–36.0)
MCV: 89.8 fL (ref 80.0–100.0)
Monocytes Absolute: 0.3 10*3/uL (ref 0.1–1.0)
Monocytes Relative: 11 %
Neutro Abs: 1.6 10*3/uL — ABNORMAL LOW (ref 1.7–7.7)
Neutrophils Relative %: 52 %
Platelets: 190 10*3/uL (ref 150–400)
RBC: 4.63 MIL/uL (ref 4.22–5.81)
RDW: 14.2 % (ref 11.5–15.5)
WBC: 3 10*3/uL — ABNORMAL LOW (ref 4.0–10.5)
nRBC: 0 % (ref 0.0–0.2)

## 2020-11-17 LAB — COMPREHENSIVE METABOLIC PANEL
ALT: 20 U/L (ref 0–44)
AST: 20 U/L (ref 15–41)
Albumin: 3.9 g/dL (ref 3.5–5.0)
Alkaline Phosphatase: 71 U/L (ref 38–126)
Anion gap: 7 (ref 5–15)
BUN: 28 mg/dL — ABNORMAL HIGH (ref 8–23)
CO2: 24 mmol/L (ref 22–32)
Calcium: 9 mg/dL (ref 8.9–10.3)
Chloride: 106 mmol/L (ref 98–111)
Creatinine, Ser: 1.28 mg/dL — ABNORMAL HIGH (ref 0.61–1.24)
GFR, Estimated: 56 mL/min — ABNORMAL LOW (ref >60)
Glucose, Bld: 143 mg/dL — ABNORMAL HIGH (ref 70–99)
Potassium: 4.3 mmol/L (ref 3.5–5.1)
Sodium: 137 mmol/L (ref 135–145)
Total Bilirubin: 0.4 mg/dL (ref 0.3–1.2)
Total Protein: 7.4 g/dL (ref 6.5–8.1)

## 2020-11-18 LAB — CEA: CEA: 0.9 ng/mL (ref 0.0–4.7)

## 2020-11-19 ENCOUNTER — Encounter: Payer: Self-pay | Admitting: Oncology

## 2020-11-19 NOTE — Progress Notes (Signed)
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  He was recently found to have iron deficiency anemia which led to a colonoscopy On 07/15/2018.  Colonoscopy showed a villous partially obstructing medium-sized mass in the mid sigmoid colon.  The mass was partially circumferential measuring 5 cm.  There was another infiltrative partially obstructing medium-sized mass found in the rectosigmoid colon which also measured 5 cm.  Pathology from the sigmoid colon mass showed high-grade dysplasia at least involving an adenomatous lesion with villous features.  A more serious process is not excluded.  Rectosigmoid mass biopsy showed invasive colorectal adenocarcinoma.   Patient was referred to Dr. [name] for surgical management.  Patient had a CT abdomen and pelvis with contrast which showed irregular asymmetric mural thickening of the proximal rectum measuring over 6 to 7 cm.  Potential local nodal metastases with several subcentimeter iliac lymph nodes and presacral lymph nodes.  No evidence of metastatic disease outside the pelvis.   Patient also had MRI of the pelvis with and without contrast which showed tumor length 8.5 cm with extension through muscularis propria decreased C.   There is diffuse involvement of muscularis propria.  No extramural vascular invasion or tumor thrombus.  Shortest distance of any tumor/note from mesorectal fascia 1 to 2 mm.  No involvement of adjacent organs or pelvic sidewall.  Mesorectal lymph nodes within perirectal fat including 6 mm node and a low sigmoid mesocolon node measuring 6 mm.  Another 6 mm node within the low sigmoid mesocolon representing extremity rectal lymphadenopathy.  T3N2 by MRI.  Distance from tumor to internal anal sphincter is 6.2 cm.   Patient completed 8 cycles of neoadjuvant FOLFOX chemotherapy on 12/07/2018.  Interim scans showed similar-appearing irregular wall thickening of the rectum but no retroperitoneal pelvic adenopathy.  Patient completed concurrent chemoradiation end of November 2020.   Patient had partial colectomy with a colostomy at [facility] in March 2021.  Final pathology showed invasive adenocarcinoma 6 cm grade 2.  Tumor deposit present.  Tumor invades through muscularis propria into pericolorectal tissue.  Lymphovascular invasion present.  Perineural invasion absent.  Treatment effect absent.  Extensive residual cancer with no evident tumor regression.  Poor to no response.  Margins negative.  1 out of 39 lymph nodes positive.  yPT3 pN1a  Interval history-patient reports doing well.  Appetite and weight have remained stable.  He denies any specific complaints at this time.  Colostomy is functioning well.  ECOG PS- 1 Pain scale- 0   Review of systems- Review of Systems  Constitutional:  Negative for chills, fever, malaise/fatigue and weight loss.  HENT:  Negative for congestion, ear discharge and nosebleeds.   Eyes:  Negative for blurred vision.  Respiratory:  Negative for cough, hemoptysis, sputum production, shortness of breath and wheezing.   Cardiovascular:  Negative for chest pain, palpitations, orthopnea and claudication.  Gastrointestinal:  Negative for abdominal pain, blood in stool, constipation,  diarrhea, heartburn, melena, nausea and vomiting.  Genitourinary:  Negative for dysuria, flank pain, frequency, hematuria and urgency.  Musculoskeletal:  Negative for back pain, joint pain and myalgias.  Skin:  Negative for rash.  Neurological:  Negative for dizziness, tingling, focal weakness, seizures, weakness and headaches.  Endo/Heme/Allergies:  Does not bruise/bleed easily.  Psychiatric/Behavioral:  Negative for depression and suicidal ideas. The patient does not have insomnia.      No Known Allergies   Past Medical History:  Diagnosis Date   Anemia    Coronary artery disease    Diabetes mellitus without complication ([place])    History of hiatal hernia    Hypercholesterolemia    Hypertension    Prostate cancer ([place]) 2000   Prostatectomy.    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[place]) 09/2018   Chemo tx's.    Wears dentures    partial lower     Past Surgical History:  Procedure Laterality Date   CATARACT EXTRACTION W/PHACO Right 10/12/2019   Procedure: CATARACT EXTRACTION PHACO AND INTRAOCULAR LENS PLACEMENT (IOC) RIGHT DIABETIC 6.35 00:37.1;  Surgeon: Birder Robson, MD;  Location: [place];  Service: Ophthalmology;  Laterality: Right;   COLONOSCOPY WITH PROPOFOL N/A 07/15/2018   Procedure: COLONOSCOPY WITH PROPOFOL;  Surgeon: [name], [name], MD;  Location: [facility] ENDOSCOPY;  Service: Gastroenterology;  Laterality: N/A;   ESOPHAGOGASTRODUODENOSCOPY N/A 10/12/2018   Procedure: ESOPHAGOGASTRODUODENOSCOPY (EGD);  Surgeon: [name], MD;  Location[facility] ENDOSCOPY;  Service: Gastroenterology;  Laterality: N/A;   ESOPHAGOGASTRODUODENOSCOPY (EGD) WITH PROPOFOL N/A 07/15/2018   Procedure: ESOPHAGOGASTRODUODENOSCOPY (EGD) WITH PROPOFOL;  Surgeon: [name], [name], MD;  Location: [facility] ENDOSCOPY;  Service: Gastroenterology;  Laterality: N/A;   PORTACATH PLACEMENT N/A 08/13/2018   Procedure: INSERTION PORT-A-CATH;  Surgeon: [name], MD;  Location: [facility] ORS;  Service: General;   Laterality: N/A;   PROSTATE SURGERY     ROBOT ASSISTED LAPAROSCOPIC PARTIAL COLECTOMY  05/12/2019   [place]    Social History   Socioeconomic History   Marital status: Widowed    Spouse name: Not on file   Number of children: 0   Years of education: Not on file   Highest education level: Not on file  Occupational History   Occupation: Pharmacist, hospital    Comment: Retired  Tobacco Use   Smoking status: Never   Smokeless tobacco: Never  Vaping Use   Vaping Use: Never used  Substance and Sexual Activity   Alcohol use: Not Currently   Drug use: Never   Sexual activity: Not Currently  Other Topics Concern   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  His wife was a Camera operator and they traveled frequently.  Several nieces and nephews.  He is the youngest of his brothers and sisters and only surviving.   Social Determinants of Health   Financial Resource Strain: Not on file  Food Insecurity: Not on file  Transportation Needs: Not on file  Physical Activity: Not on file  Stress: Not on file  Social Connections: Not on file  Intimate Partner Violence: Not on file    History reviewed. No pertinent family history.   Current Outpatient Medications:    metoprolol succinate (TOPROL-XL) 50 MG 24 hr tablet, Take 1 tablet by mouth 1 day or 1 dose., Disp: , Rfl:    NOVOLOG MIX 70/30 FLEXPEN (  70-30) 100 UNIT/ML FlexPen, , Disp: , Rfl:    ROCKLATAN 0.02-0.005 % SOLN, , Disp: , Rfl:    rosuvastatin (CRESTOR) 10 MG tablet, , Disp: , Rfl:    SIMBRINZA 1-0.2 % SUSP, , Disp: , Rfl:    traZODone (DESYREL) 50 MG tablet, Take 50 mg by mouth at bedtime as needed for sleep. , Disp: , Rfl:    oxyCODONE (OXY IR/ROXICODONE) 5 MG immediate release tablet, Take 1 tablet (5 mg total) by mouth every 8 (eight) hours as needed for severe pain. (Patient not taking: No sig  reported), Disp: 90 tablet, Rfl: 0   pantoprazole (PROTONIX) 40 MG tablet, Take 1 tablet (40 mg total) by mouth 2 (two) times daily before a meal. (Patient not taking: Reported on 11/17/2020), Disp: 60 tablet, Rfl: 0   tamsulosin (FLOMAX) 0.4 MG CAPS capsule, Take 0.4 mg by mouth. (Patient not taking: Reported on 11/17/2020), Disp: , Rfl:  No current facility-administered medications for this visit.  Facility-Administered Medications Ordered in Other Visits:    heparin lock flush 100 unit/mL, 500 Units, Intravenous, Once, Sindy Guadeloupe, MD   sodium chloride flush (NS) 0.9 % injection 10 mL, 10 mL, Intravenous, Once, Sindy Guadeloupe, MD   sodium chloride flush (NS) 0.9 % injection 10 mL, 10 mL, Intravenous, Once, Sindy Guadeloupe, MD   sodium chloride flush (NS) 0.9 % injection 10 mL, 10 mL, Intravenous, PRN, Sindy Guadeloupe, MD  Physical exam:  Vitals:   11/17/20 1124  BP: 116/64  Pulse: (!) 52  Resp: 20  Temp: (!) 96.2 F (35.7 C)  SpO2: 100%  Weight: 164 lb 1.6 oz (74.4 kg)   Physical Exam Cardiovascular:     Rate and Rhythm: Normal rate and regular rhythm.     Heart sounds: Normal heart sounds.  Pulmonary:     Effort: Pulmonary effort is normal.     Breath sounds: Normal breath sounds.  Abdominal:     General: Bowel sounds are normal.     Palpations: Abdomen is soft.     Comments: Colostomy in place  Skin:    General: Skin is warm and dry.  Neurological:     Mental Status: He is alert and oriented to person, place, and time.     CMP Latest Ref Rng & Units 11/17/2020  Glucose 70 - 99 mg/dL 143(H)  BUN 8 - 23 mg/dL 28(H)  Creatinine 0.61 - 1.24 mg/dL 1.28(H)  Sodium 135 - 145 mmol/L 137  Potassium 3.5 - 5.1 mmol/L 4.3  Chloride 98 - 111 mmol/L 106  CO2 22 - 32 mmol/L 24  Calcium 8.9 - 10.3 mg/dL 9.0  Total Protein 6.5 - 8.1 g/dL 7.4  Total Bilirubin 0.3 - 1.2 mg/dL 0.4  Alkaline Phos 38 - 126 U/L 71  AST 15 - 41 U/L 20  ALT 0 - 44 U/L 20   CBC Latest Ref Rng & Units  11/17/2020  WBC 4.0 - 10.5 K/uL 3.0(L)  Hemoglobin 13.0 - 17.0 g/dL 14.0  Hematocrit 39.0 - 52.0 % 41.6  Platelets 150 - 400 K/uL 190    No images are attached to the encounter.  CT CHEST ABDOMEN PELVIS W CONTRAST  Result Date: 11/16/2020 CLINICAL DATA:  Follow-up rectal cancer. Patient status post concurrent chemo radiation therapy for stage IIIB adenocarcinoma of the rectum. Patient had definitive surgical therapy. EXAM: CT CHEST, ABDOMEN, AND PELVIS WITH CONTRAST TECHNIQUE: Multidetector CT imaging of the chest, abdomen and pelvis was performed following the standard protocol  during bolus administration of intravenous contrast. CONTRAST:  50m OMNIPAQUE IOHEXOL 350 MG/ML SOLN COMPARISON:  CT 04/11/2020 FINDINGS: CT CHEST FINDINGS Cardiovascular: Coronary artery calcification and aortic atherosclerotic calcification. Mediastinum/Nodes: No axillary or supraclavicular adenopathy. No mediastinal or hilar adenopathy. No pericardial fluid. Esophagus normal. Lungs/Pleura: No suspicious pulmonary nodules. Normal pleural. Airways normal. Musculoskeletal: No aggressive osseous lesion. CT ABDOMEN AND PELVIS FINDINGS Hepatobiliary: No focal hepatic lesion. No biliary ductal dilatation. Gallbladder is normal. Common bile duct is normal. Pancreas: Pancreas is normal. No ductal dilatation. No pancreatic inflammation. Spleen: Normal spleen Adrenals/urinary tract: Adrenal glands and kidneys are normal. The ureters and bladder normal. Stomach/Bowel: Stomach, duodenum small-bowel normal. Appendix and cecum normal. Multiple diverticula ascending and transverse colon. Descending colon exits a LEFT abdominal wall colostomy. Rectal pouch noted in the pelvis. No mesenteric lymphadenopathy. Vascular/Lymphatic: Abdominal aorta is normal caliber. There is no retroperitoneal or periportal lymphadenopathy. No pelvic lymphadenopathy. Reproductive: Post hysterectomy anatomy Other: No peritoneal metastasis Musculoskeletal: No aggressive  osseous lesion. IMPRESSION: 1. No evidence rectal carcinoma metastasis on CT chest, abdomen, and pelvis. 2. LEFT abdominal wall colostomy and rectal pouch with evidence complication. Electronically Signed   By: SSuzy BouchardM.D.   On: 11/16/2020 09:14     Assessment and plan- Patient is a 80y.o. male with stage III rectal adenocarcinoma s/p total neoadjuvant treatment with FOLFOX chemotherapy 4 months followed by concurrent chemoradiation.  Final pathology showed poor response to treatment ypT3ApN1a.  This is a routine surveillance visit for rectal cancer    Clinically patient is doing well with no concerning signs and symptoms of recurrence based on today's exam.  Also had a CT chest abdomen pelvis with contrast on 9/7/2022Which did not show any evidence of recurrent disease.  Colostomy is functioning well.  Appetite and weight are overall stable and patient has gained 10 pounds as compared to last visit 6 months ago.    I will see him back in 6 months with CBC with differential CMP CEA and CT scans prior.  [facility] see if he would benefit from surveillance colonoscopy based on his prior surgery  Visit Diagnosis 1. Encounter for follow-up surveillance of rectal cancer      Dr. ARanda Evens MD, MPH [facility]3XJ:[id number]/01/2021 9:18 AM

## 2020-11-21 ENCOUNTER — Telehealth: Payer: Self-pay

## 2020-11-21 NOTE — Telephone Encounter (Signed)
Per Dr. Sheryn Bison "he has colon proximal to his colostomy, so, yes, technically, he can be in surveillance.". I have notified Willie [name] and Dr. Pennie Rushing. [facility] will reach out to him and arrange.

## 2020-12-26 ENCOUNTER — Encounter: Payer: Self-pay | Admitting: Oncology

## 2021-02-08 DIAGNOSIS — K219 Gastro-esophageal reflux disease without esophagitis: Secondary | ICD-10-CM | POA: Insufficient documentation

## 2021-05-01 ENCOUNTER — Encounter: Payer: Self-pay | Admitting: Internal Medicine

## 2021-05-02 ENCOUNTER — Ambulatory Visit: Payer: Medicare PPO | Admitting: Anesthesiology

## 2021-05-02 ENCOUNTER — Ambulatory Visit
Admission: RE | Admit: 2021-05-02 | Discharge: 2021-05-02 | Disposition: A | Discharge status: Home / Self Care | Payer: Medicare PPO | Attending: Internal Medicine | Admitting: Internal Medicine

## 2021-05-02 ENCOUNTER — Encounter
Admission: RE | Disposition: A | Discharge status: Home / Self Care | Payer: Self-pay | Source: Home / Self Care | Attending: Internal Medicine

## 2021-05-02 DIAGNOSIS — K219 Gastro-esophageal reflux disease without esophagitis: Secondary | ICD-10-CM | POA: Insufficient documentation

## 2021-05-02 DIAGNOSIS — Z08 Encounter for follow-up examination after completed treatment for malignant neoplasm: Secondary | ICD-10-CM | POA: Insufficient documentation

## 2021-05-02 DIAGNOSIS — Z85048 Personal history of other malignant neoplasm of rectum, rectosigmoid junction, and anus: Secondary | ICD-10-CM | POA: Insufficient documentation

## 2021-05-02 DIAGNOSIS — Z85038 Personal history of other malignant neoplasm of large intestine: Secondary | ICD-10-CM | POA: Insufficient documentation

## 2021-05-02 DIAGNOSIS — E119 Type 2 diabetes mellitus without complications: Secondary | ICD-10-CM | POA: Insufficient documentation

## 2021-05-02 DIAGNOSIS — Z79899 Other long term (current) drug therapy: Secondary | ICD-10-CM | POA: Diagnosis not present

## 2021-05-02 DIAGNOSIS — Z933 Colostomy status: Secondary | ICD-10-CM | POA: Diagnosis not present

## 2021-05-02 DIAGNOSIS — K5790 Diverticulosis of intestine, part unspecified, without perforation or abscess without bleeding: Secondary | ICD-10-CM | POA: Diagnosis not present

## 2021-05-02 DIAGNOSIS — K635 Polyp of colon: Secondary | ICD-10-CM | POA: Insufficient documentation

## 2021-05-02 DIAGNOSIS — Z794 Long term (current) use of insulin: Secondary | ICD-10-CM | POA: Diagnosis not present

## 2021-05-02 HISTORY — PX: COLONOSCOPY: SHX5424

## 2021-05-02 HISTORY — DX: Gastro-esophageal reflux disease without esophagitis: K21.9

## 2021-05-02 LAB — GLUCOSE, CAPILLARY: Glucose-Capillary: 148 mg/dL — ABNORMAL HIGH (ref 70–99)

## 2021-05-02 SURGERY — COLONOSCOPY
Anesthesia: General

## 2021-05-02 MED ORDER — SODIUM CHLORIDE 0.9 % IV SOLN
INTRAVENOUS | Status: DC
  Administered 2021-05-02: 20 mL/h via INTRAVENOUS

## 2021-05-02 MED ORDER — LABETALOL HCL 5 MG/ML IV SOLN
INTRAVENOUS | Status: DC | PRN
Start: 2021-05-02 — End: 2021-05-02
  Administered 2021-05-02: 5 mg via INTRAVENOUS

## 2021-05-02 MED ORDER — PROPOFOL 500 MG/50ML IV EMUL
INTRAVENOUS | Status: DC | PRN
  Administered 2021-05-02: 145 ug/kg/min via INTRAVENOUS

## 2021-05-02 MED ORDER — LIDOCAINE HCL (CARDIAC) PF 100 MG/5ML IV SOSY
PREFILLED_SYRINGE | INTRAVENOUS | Status: DC | PRN
  Administered 2021-05-02: 100 mg via INTRAVENOUS

## 2021-05-02 MED ORDER — PROPOFOL 10 MG/ML IV BOLUS
INTRAVENOUS | Status: DC | PRN
  Administered 2021-05-02: 50 mg via INTRAVENOUS
  Administered 2021-05-02: 10 mg via INTRAVENOUS

## 2021-05-02 NOTE — Transfer of Care (Signed)
Immediate Anesthesia Transfer of Care Note  Patient: Willie [name]  Procedure(s) Performed: COLONOSCOPY  Patient Location: Endoscopy Unit  Anesthesia Type:General  Level of Consciousness: drowsy and patient cooperative  Airway & Oxygen Therapy: Patient Spontanous Breathing and Patient connected to face mask oxygen  Post-op Assessment: Report given to RN and Post -op Vital signs reviewed and stable  Post vital signs: Reviewed and stable  Last Vitals:  Vitals Value Taken Time  BP 127/71 05/02/21 0917  Temp    Pulse 77 05/02/21 0919  Resp 24 05/02/21 0919  SpO2 100 % 05/02/21 0919  Vitals shown include unvalidated device data.  Last Pain:  Vitals:   05/02/21 0757  TempSrc: Temporal  PainSc: 0-No pain         Complications: No notable events documented.

## 2021-05-02 NOTE — Anesthesia Preprocedure Evaluation (Signed)
Anesthesia Evaluation  Patient identified by MRN, date of birth, ID band Patient awake    Reviewed: Allergy & Precautions, H&P , NPO status , Patient's Chart, lab work & pertinent test results, reviewed documented beta blocker date and time   Airway Mallampati: II   Neck ROM: full    Dental  (+) Poor Dentition   Pulmonary neg pulmonary ROS,    Pulmonary exam normal        Cardiovascular Exercise Tolerance: Good hypertension, On Medications + CAD  Normal cardiovascular exam Rhythm:regular Rate:Normal     Neuro/Psych negative neurological ROS  negative psych ROS   GI/Hepatic Neg liver ROS, hiatal hernia, GERD  ,  Endo/Other  negative endocrine ROSdiabetes  Renal/GU negative Renal ROS  negative genitourinary   Musculoskeletal   Abdominal   Peds  Hematology  (+) Blood dyscrasia, anemia ,   Anesthesia Other Findings Past Medical History: No date: Anemia No date: Coronary artery disease No date: Diabetes mellitus without complication ([facility]) No date: GERD (gastroesophageal reflux disease) No date: History of hiatal hernia No date: Hypercholesterolemia No date: Hypertension 2000: Prostate cancer ([place])     Comment:  Prostatectomy.  09/2018: 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[place])     Comment:  Chemo tx's.  No date: Wears dentures     Comment:  partial lower Past Surgical History: 10/12/2019: CATARACT EXTRACTION W/PHACO; Right     Comment:  Procedure: CATARACT EXTRACTION PHACO AND INTRAOCULAR               LENS PLACEMENT (IOC) RIGHT DIABETIC 6.35 00:37.1;                Surgeon: Birder Robson, MD;  Location: [place];  Service: Ophthalmology;  Laterality: Right; 07/15/2018: COLONOSCOPY WITH PROPOFOL; N/A     Comment:  Procedure: COLONOSCOPY WITH PROPOFOL;  Surgeon: [name],               [name], MD;  Location: [facility] ENDOSCOPY;  Service:               Gastroenterology;  Laterality: N/A; 10/12/2018:  ESOPHAGOGASTRODUODENOSCOPY; N/A     Comment:  Procedure: ESOPHAGOGASTRODUODENOSCOPY (EGD);  Surgeon:               [name], MD;  Location: [facility] ENDOSCOPY;                Service: Gastroenterology;  Laterality: N/A; 07/15/2018: ESOPHAGOGASTRODUODENOSCOPY (EGD) WITH PROPOFOL; N/A     Comment:  Procedure: ESOPHAGOGASTRODUODENOSCOPY (EGD) WITH               PROPOFOL;  Surgeon: [name], [name], MD;  Location:               [facility] ENDOSCOPY;  Service: Gastroenterology;  Laterality:               N/A; 08/13/2018: PORTACATH PLACEMENT; N/A     Comment:  Procedure: INSERTION PORT-A-CATH;  Surgeon:               [name], MD;  Location: [facility] ORS;  Service:              General;  Laterality: N/A; No date: PROSTATE SURGERY 05/12/2019: ROBOT ASSISTED LAPAROSCOPIC PARTIAL COLECTOMY     Comment:  Duk BMI    Body Mass Index: 30.11 kg/m     Reproductive/Obstetrics negative OB ROS  Anesthesia Physical Anesthesia Plan  ASA: 3  Anesthesia Plan: General   Post-op Pain Management:    Induction:   PONV Risk Score and Plan:   Airway Management Planned:   Additional Equipment:   Intra-op Plan:   Post-operative Plan:   Informed Consent: I have reviewed the patients History and Physical, chart, labs and discussed the procedure including the risks, benefits and alternatives for the proposed anesthesia with the patient or authorized representative who has indicated his/her understanding and acceptance.     Dental Advisory Given  Plan Discussed with: CRNA  Anesthesia Plan Comments:         Anesthesia Quick Evaluation

## 2021-05-02 NOTE — Op Note (Signed)
[facility] Gastroenterology Patient Name: Willie [name] Procedure Date: 05/02/2021 8:45 AM MRN: [id number] Account #: [id number] Date of Birth: February 14, 1941 Admit Type: Outpatient Age: 81 Room: [facility] ENDO ROOM 2 Gender: Male Note Status: Finalized Instrument Name: Jasper Riling [id number] Procedure:             Colonoscopy Indications:           High risk colon cancer surveillance: Personal history                         of colon cancer, High risk colon cancer surveillance:                         Personal history of rectal cancer Providers:             [name]. Alice Reichert MD, MD Referring MD:          [name], MD (Referring MD) Medicines:             Propofol per Anesthesia Complications:         No immediate complications. Estimated blood loss: None. Procedure:             Pre-Anesthesia Assessment:                        - The risks and benefits of the procedure and the                         sedation options and risks were discussed with the                         patient. All questions were answered and informed                         consent was obtained.                        - Patient identification and proposed procedure were                         verified prior to the procedure by the nurse. The                         procedure was verified in the procedure room.                        - ASA Grade Assessment: III - A patient with severe                         systemic disease.                        - After reviewing the risks and benefits, the patient                         was deemed in satisfactory condition to undergo the                         procedure.  After obtaining informed consent, the colonoscope was                         passed under direct vision. Throughout the procedure,                         the patient's blood pressure, pulse, and oxygen                         saturations were monitored  continuously. The                         Colonoscope was introduced through the descending                         colostomy and advanced to the the cecum, identified by                         appendiceal orifice and ileocecal valve. The                         colonoscopy was performed without difficulty. The                         patient tolerated the procedure well. The quality of                         the bowel preparation was adequate. The ileocecal                         valve, appendiceal orifice, and rectum were                         photographed. Findings:      There was evidence of a widely patent end colostomy in the descending       colon. This was characterized by healthy appearing mucosa.      Many small and large-mouthed diverticula were found in the entire colon.      A 4 mm polyp was found in the ascending colon. The polyp was sessile.       The polyp was removed with a jumbo cold forceps. Resection and retrieval       were complete.      The exam was otherwise without abnormality. Impression:            - Widely patent end colostomy with healthy appearing                         mucosa in the descending colon.                        - Diverticulosis in the entire examined colon.                        - One 4 mm polyp in the ascending colon, removed with                         a jumbo cold forceps. Resected and retrieved.                        -  The examination was otherwise normal. Recommendation:        - Patient has a contact number available for                         emergencies. The signs and symptoms of potential                         delayed complications were discussed with the patient.                         Return to normal activities tomorrow. Written                         discharge instructions were provided to the patient.                        - Resume previous diet.                        - Continue present medications.                         - Await pathology results.                        - Repeat colonoscopy in 1 year for surveillance.                        - The findings and recommendations were discussed with                         the patient.                        - Return to GI office PRN.                        - Follow up with Dr. Randa Evens of Oncology as                         scheduled.                        - The findings and recommendations were discussed with                         the patient. Procedure Code(s):     --- Professional ---                        [phone], Colonoscopy through stoma; with biopsy, single                         or multiple Diagnosis Code(s):     --- Professional ---                        K57.30, Diverticulosis of large intestine without                         perforation or abscess without bleeding  K63.5, Polyp of colon                        Z93.3, Colostomy status                        Z85.048, Personal history of other malignant neoplasm                         of rectum, rectosigmoid junction, and anus                        Z85.038, Personal history of other malignant neoplasm                         of large intestine CPT copyright 2019 American Medical Association. All rights reserved. The codes documented in this report are preliminary and upon coder review may  be revised to meet current compliance requirements. Efrain Sella MD, MD 05/02/2021 9:20:01 AM This report has been signed electronically. Number of Addenda: 0 Note Initiated On: 05/02/2021 8:45 AM Scope Withdrawal Time: 0 hours 5 minutes 49 seconds  Total Procedure Duration: 0 hours 8 minutes 52 seconds  Estimated Blood Loss:  Estimated blood loss: none.      [facility]

## 2021-05-02 NOTE — H&P (Signed)
Outpatient short stay form Pre-procedure 05/02/2021 8:30 AM Willie [name] Willie [name], M.D.  Primary Physician: Willie [name], M.D.  Reason for visit:  hx of rectal cancer  History of present illness:  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CMS-HCC) Comments: one lymph node positive for carcinoma on surgery. No widespread metastasis. Orders: - Ambulatory Referral to Colonoscopy  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CMS-HCC) Comments: one lymph node positive for carcinoma on surgery. No widespread metastasis. Orders: - Ambulatory Referral to Colonoscopy  Controlled type 2 diabetes mellitus without complication, with long-term current use of insulin (CMS-HCC)  Gastroesophageal reflux disease, unspecified whether esophagitis present Comments: well controlled on pantoprazole 40mg /d  Colostomy in place (CMS-HCC)  I congratulated the patient on his successful recovery and he is agreeable to proceed with colonoscopy through colostomy in January or February 2023 at [facility]. Patient wishes to avoid scheduling procedure for December 2022 due to travel plans this month to [place].  S/p robotic assisted sigmoidectomy and proctectomy in March 2021.      Current Facility-Administered Medications:    0.9 %  sodium chloride infusion, , Intravenous, Continuous, Willie, Willie Pike, MD, Last Rate: 20 mL/hr at 05/02/21 5732, 20 mL/hr at 05/02/21 0807  Facility-Administered Medications Ordered in Other Encounters:    heparin lock flush 100 unit/mL, 500 Units, Intravenous, Once, Willie Guadeloupe, MD   sodium chloride flush (NS) 0.9 % injection 10 mL, 10 mL, Intravenous, Once, Willie Guadeloupe, MD   sodium chloride flush (NS) 0.9 % injection 10 mL, 10 mL, Intravenous, Once, Willie Guadeloupe, MD   sodium chloride flush (NS) 0.9 % injection 10 mL, 10 mL, Intravenous, PRN, Willie Guadeloupe, MD  Medications Prior to Admission  Medication Sig Dispense Refill Last Dose   dorzolamide (TRUSOPT) 2 % ophthalmic solution 1 drop 3 (three) times daily.   Past Week    metoprolol succinate (TOPROL-XL) 50 MG 24 hr tablet Take 1 tablet by mouth 1 day or 1 dose.   Past Week   NOVOLOG MIX 70/30 FLEXPEN (70-30) 100 UNIT/ML FlexPen    05/01/2021   ROCKLATAN 0.02-0.005 % SOLN    Past Week   rosuvastatin (CRESTOR) 10 MG tablet    Past Week   SIMBRINZA 1-0.2 % SUSP    Past Week   traZODone (DESYREL) 50 MG tablet Take 50 mg by mouth at bedtime as needed for sleep.    Past Week   oxyCODONE (OXY IR/ROXICODONE) 5 MG immediate release tablet Take 1 tablet (5 mg total) by mouth every 8 (eight) hours as needed for severe pain. (Patient not taking: No sig reported) 90 tablet 0    pantoprazole (PROTONIX) 40 MG tablet Take 1 tablet (40 mg total) by mouth 2 (two) times daily before a meal. (Patient not taking: Reported on 11/17/2020) 60 tablet 0    tamsulosin (FLOMAX) 0.4 MG CAPS capsule Take 0.4 mg by mouth. (Patient not taking: Reported on 11/17/2020)        No Known Allergies   Past Medical History:  Diagnosis Date   Anemia    Coronary artery disease    Diabetes mellitus without complication (HCC)    GERD (gastroesophageal reflux disease)    History of hiatal hernia    Hypercholesterolemia    Hypertension    Prostate cancer ([place]) 2000   Prostatectomy.    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[place]) 09/2018   Chemo tx's.    Wears dentures    partial lower    Review of systems:  Otherwise negative.    Physical Exam  Gen: Alert,  oriented. Appears stated age.  HEENT: [place]/AT. PERRLA. Lungs: CTA, no wheezes. CV: RR nl S1, S2. Abd: soft, benign, no masses. BS+ Ext: No edema. Pulses 2+    Planned procedures: Proceed with colonoscopy thru colostomy. The patient understands the nature of the planned procedure, indications, risks, alternatives and potential complications including but not limited to bleeding, infection, perforation, damage to internal organs and possible oversedation/side effects from anesthesia. The patient agrees and gives consent to proceed.  Please refer to procedure  notes for findings, recommendations and patient disposition/instructions.     Willie [name] Willie [name], M.D. Gastroenterology 05/02/2021  8:30 AM

## 2021-05-02 NOTE — Anesthesia Postprocedure Evaluation (Signed)
Anesthesia Post Note  Patient: Willie [name]  Procedure(s) Performed: COLONOSCOPY  Patient location during evaluation: PACU Anesthesia Type: General Level of consciousness: awake and alert Pain management: pain level controlled Vital Signs Assessment: post-procedure vital signs reviewed and stable Respiratory status: spontaneous breathing, nonlabored ventilation, respiratory function stable and patient connected to nasal cannula oxygen Cardiovascular status: blood pressure returned to baseline and stable Postop Assessment: no apparent nausea or vomiting Anesthetic complications: no   No notable events documented.   Last Vitals:  Vitals:   05/02/21 0757  BP: (!) 182/106  Pulse: (!) 124  Resp: 20  Temp: (!) 36.2 C  SpO2: 99%    Last Pain:  Vitals:   05/02/21 0757  TempSrc: Temporal  PainSc: 0-No pain                 Molli Barrows

## 2021-05-02 NOTE — Anesthesia Procedure Notes (Signed)
Procedure Name: General with mask airway Date/Time: 05/02/2021 8:58 AM Performed by: Kelton Pillar, CRNA Pre-anesthesia Checklist: Patient identified, Emergency Drugs available, Suction available and Patient being monitored Patient Re-evaluated:Patient Re-evaluated prior to induction Oxygen Delivery Method: Simple face mask Induction Type: IV induction Placement Confirmation: positive ETCO2 and CO2 detector Dental Injury: Teeth and Oropharynx as per pre-operative assessment

## 2021-05-03 ENCOUNTER — Encounter: Payer: Self-pay | Admitting: Internal Medicine

## 2021-05-03 LAB — SURGICAL PATHOLOGY

## 2021-05-17 ENCOUNTER — Inpatient Hospital Stay: Payer: Medicare PPO | Attending: Nurse Practitioner

## 2021-05-17 ENCOUNTER — Other Ambulatory Visit: Payer: Self-pay

## 2021-05-17 ENCOUNTER — Ambulatory Visit
Admission: RE | Admit: 2021-05-17 | Discharge: 2021-05-17 | Disposition: A | Discharge status: Home / Self Care | Payer: Medicare PPO | Source: Ambulatory Visit | Attending: Oncology | Admitting: Oncology

## 2021-05-17 DIAGNOSIS — Z08 Encounter for follow-up examination after completed treatment for malignant neoplasm: Secondary | ICD-10-CM | POA: Diagnosis present

## 2021-05-17 DIAGNOSIS — Z85048 Personal history of other malignant neoplasm of rectum, rectosigmoid junction, and anus: Secondary | ICD-10-CM | POA: Insufficient documentation

## 2021-05-17 DIAGNOSIS — Z8546 Personal history of malignant neoplasm of prostate: Secondary | ICD-10-CM | POA: Insufficient documentation

## 2021-05-17 DIAGNOSIS — C20 Malignant neoplasm of rectum: Secondary | ICD-10-CM

## 2021-05-17 DIAGNOSIS — I10 Essential (primary) hypertension: Secondary | ICD-10-CM | POA: Diagnosis present

## 2021-05-17 DIAGNOSIS — E119 Type 2 diabetes mellitus without complications: Secondary | ICD-10-CM | POA: Insufficient documentation

## 2021-05-17 LAB — COMPREHENSIVE METABOLIC PANEL
ALT: 31 U/L (ref 0–44)
AST: 23 U/L (ref 15–41)
Albumin: 4 g/dL (ref 3.5–5.0)
Alkaline Phosphatase: 74 U/L (ref 38–126)
Anion gap: 7 (ref 5–15)
BUN: 17 mg/dL (ref 8–23)
CO2: 26 mmol/L (ref 22–32)
Calcium: 9.2 mg/dL (ref 8.9–10.3)
Chloride: 102 mmol/L (ref 98–111)
Creatinine, Ser: 1.15 mg/dL (ref 0.61–1.24)
GFR, Estimated: >60 mL/min (ref >60)
Glucose, Bld: 161 mg/dL — ABNORMAL HIGH (ref 70–99)
Potassium: 4.1 mmol/L (ref 3.5–5.1)
Sodium: 135 mmol/L (ref 135–145)
Total Bilirubin: 0.5 mg/dL (ref 0.3–1.2)
Total Protein: 7.5 g/dL (ref 6.5–8.1)

## 2021-05-17 LAB — CBC WITH DIFFERENTIAL/PLATELET
Abs Immature Granulocytes: 0.01 10*3/uL (ref 0.00–0.07)
Basophils Absolute: 0 10*3/uL (ref 0.0–0.1)
Basophils Relative: 1 %
Eosinophils Absolute: 0.1 10*3/uL (ref 0.0–0.5)
Eosinophils Relative: 4 %
HCT: 43.8 % (ref 39.0–52.0)
Hemoglobin: 14.3 g/dL (ref 13.0–17.0)
Immature Granulocytes: 0 %
Lymphocytes Relative: 29 %
Lymphs Abs: 1 10*3/uL (ref 0.7–4.0)
MCH: 29.2 pg (ref 26.0–34.0)
MCHC: 32.6 g/dL (ref 30.0–36.0)
MCV: 89.6 fL (ref 80.0–100.0)
Monocytes Absolute: 0.3 10*3/uL (ref 0.1–1.0)
Monocytes Relative: 9 %
Neutro Abs: 2 10*3/uL (ref 1.7–7.7)
Neutrophils Relative %: 57 %
Platelets: 185 10*3/uL (ref 150–400)
RBC: 4.89 MIL/uL (ref 4.22–5.81)
RDW: 13.9 % (ref 11.5–15.5)
WBC: 3.5 10*3/uL — ABNORMAL LOW (ref 4.0–10.5)
nRBC: 0 % (ref 0.0–0.2)

## 2021-05-17 MED ORDER — IOHEXOL 300 MG/ML  SOLN
100.0000 mL | Freq: Once | INTRAMUSCULAR | Status: AC | PRN
  Administered 2021-05-17: 10:00:00 100 mL via INTRAVENOUS

## 2021-05-18 LAB — CEA: CEA: 0.6 ng/mL (ref 0.0–4.7)

## 2021-05-21 ENCOUNTER — Encounter: Payer: Self-pay | Admitting: Nurse Practitioner

## 2021-05-21 ENCOUNTER — Other Ambulatory Visit: Payer: Self-pay

## 2021-05-21 ENCOUNTER — Inpatient Hospital Stay: Payer: Medicare PPO | Admitting: Nurse Practitioner

## 2021-05-21 VITALS — BP 180/72 | HR 52 | Temp 97.2°F | Resp 18 | Wt 172.0 lb

## 2021-05-21 DIAGNOSIS — Z85048 Personal history of other malignant neoplasm of rectum, rectosigmoid junction, and anus: Secondary | ICD-10-CM | POA: Diagnosis not present

## 2021-05-21 DIAGNOSIS — Z08 Encounter for follow-up examination after completed treatment for malignant neoplasm: Secondary | ICD-10-CM

## 2021-05-21 DIAGNOSIS — I10 Essential (primary) hypertension: Secondary | ICD-10-CM | POA: Diagnosis not present

## 2021-05-21 DIAGNOSIS — C20 Malignant neoplasm of rectum: Secondary | ICD-10-CM

## 2021-05-21 NOTE — Progress Notes (Signed)
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  He was recently found to have iron deficiency anemia which led to a colonoscopy On 07/15/2018.  Colonoscopy showed a villous partially obstructing medium-sized mass in the mid sigmoid colon.  The mass was partially circumferential measuring 5 cm.  There was another infiltrative partially obstructing medium-sized mass found in the rectosigmoid colon which also measured 5 cm.  Pathology from the sigmoid colon mass showed high-grade dysplasia at least involving an adenomatous lesion with villous features.  A more serious process is not excluded.  Rectosigmoid mass biopsy showed invasive colorectal adenocarcinoma.   Patient was referred to Dr. [name] for surgical management.  Patient had a CT abdomen and pelvis with contrast which showed irregular asymmetric mural thickening of the proximal rectum measuring over 6 to 7 cm.  Potential local nodal metastases with several subcentimeter iliac lymph nodes and presacral lymph nodes.  No evidence of metastatic disease outside the pelvis.   Patient also had MRI of the pelvis with and without contrast which showed tumor length 8.5 cm with extension through muscularis propria decreased C.  There  is diffuse involvement of muscularis propria.  No extramural vascular invasion or tumor thrombus.  Shortest distance of any tumor/note from mesorectal fascia 1 to 2 mm.  No involvement of adjacent organs or pelvic sidewall.  Mesorectal lymph nodes within perirectal fat including 6 mm node and a low sigmoid mesocolon node measuring 6 mm.  Another 6 mm node within the low sigmoid mesocolon representing extremity rectal lymphadenopathy.  T3N2 by MRI.  Distance from tumor to internal anal sphincter is 6.2 cm.   Patient completed 8 cycles of neoadjuvant FOLFOX chemotherapy on 12/07/2018.  Interim scans showed similar-appearing irregular wall thickening of the rectum but no retroperitoneal pelvic adenopathy.  Patient completed concurrent chemoradiation end of November 2020.   Patient had partial colectomy with a colostomy at [facility] in March 2021.  Final pathology showed invasive adenocarcinoma 6 cm grade 2.  Tumor deposit present.  Tumor invades through muscularis propria into pericolorectal tissue.  Lymphovascular invasion present.  Perineural invasion absent.  Treatment effect absent.  Extensive residual cancer with no evident tumor regression.  Poor to no response.  Margins negative.  1 out of 39 lymph nodes positive.  yPT3 pN1a  Interval history- Patient is 81 year old male with above history of rectal cancer status post colostomy who returns to clinic for continued surveillance.  In the interim he has undergone imaging and colonoscopy.  Appetite and weight is stable.  He denies complaints today and feels well.  ECOG PS- 0 Pain scale- 0   Review of systems- Review of Systems  Constitutional:  Negative for chills, fever, malaise/fatigue and weight loss.  HENT:  Negative for congestion, ear discharge and nosebleeds.   Eyes:  Negative for blurred vision.  Respiratory:  Negative for cough, hemoptysis, sputum production,  shortness of breath and wheezing.   Cardiovascular:  Negative for chest pain,  palpitations, orthopnea and claudication.  Gastrointestinal:  Negative for abdominal pain, blood in stool, constipation, diarrhea, heartburn, melena, nausea and vomiting.  Genitourinary:  Negative for dysuria, flank pain, frequency, hematuria and urgency.  Musculoskeletal:  Negative for back pain, joint pain and myalgias.  Skin:  Negative for rash.  Neurological:  Negative for dizziness, tingling, focal weakness, seizures, weakness and headaches.  Endo/Heme/Allergies:  Does not bruise/bleed easily.  Psychiatric/Behavioral:  Negative for depression and suicidal ideas. The patient does not have insomnia.      No Known Allergies   Past Medical History:  Diagnosis Date   Anemia    Coronary artery disease    Diabetes mellitus without complication (HCC)    GERD (gastroesophageal reflux disease)    History of hiatal hernia    Hypercholesterolemia    Hypertension    Prostate cancer ([place]) 2000   Prostatectomy.    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[place]) 09/2018   Chemo tx's.    Wears dentures    partial lower     Past Surgical History:  Procedure Laterality Date   CATARACT EXTRACTION W/PHACO Right 10/12/2019   Procedure: CATARACT EXTRACTION PHACO AND INTRAOCULAR LENS PLACEMENT (IOC) RIGHT DIABETIC 6.35 00:37.1;  Surgeon: Birder Robson, MD;  Location: [facility];  Service: Ophthalmology;  Laterality: Right;   COLONOSCOPY N/A 05/02/2021   Procedure: COLONOSCOPY;  Surgeon: [name], [name], MD;  Location: [facility] ENDOSCOPY;  Service: Gastroenterology;  Laterality: N/A;  IDDM   COLONOSCOPY WITH PROPOFOL N/A 07/15/2018   Procedure: COLONOSCOPY WITH PROPOFOL;  Surgeon: [name], [name], MD;  Location: [facility] ENDOSCOPY;  Service: Gastroenterology;  Laterality: N/A;   ESOPHAGOGASTRODUODENOSCOPY N/A 10/12/2018   Procedure: ESOPHAGOGASTRODUODENOSCOPY (EGD);  Surgeon: [name], MD;  Location: [facility] ENDOSCOPY;  Service: Gastroenterology;  Laterality: N/A;   ESOPHAGOGASTRODUODENOSCOPY (EGD) WITH PROPOFOL  N/A 07/15/2018   Procedure: ESOPHAGOGASTRODUODENOSCOPY (EGD) WITH PROPOFOL;  Surgeon: [name], [name], MD;  Location: [facility] ENDOSCOPY;  Service: Gastroenterology;  Laterality: N/A;   PORTACATH PLACEMENT N/A 08/13/2018   Procedure: INSERTION PORT-A-CATH;  Surgeon: [name], MD;  Location: [facility] ORS;  Service: General;  Laterality: N/A;   PROSTATE SURGERY     ROBOT ASSISTED LAPAROSCOPIC PARTIAL COLECTOMY  05/12/2019   [place]    Social History   Socioeconomic History   Marital status: Widowed    Spouse name: Not on file   Number of children: 0   Years of education: Not on file   Highest education level: Not on file  Occupational History   Occupation: Pharmacist, hospital    Comment: Retired  Tobacco Use   Smoking status: Never   Smokeless tobacco: Never  Vaping Use   Vaping Use: Never used  Substance and Sexual Activity   Alcohol use: Not Currently   Drug use: Never   Sexual activity: Not Currently  Other Topics Concern   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  His wife was a Camera operator and they traveled frequently.  Several nieces and nephews.  He is the youngest of his brothers and sisters and only surviving.   Social Determinants of Health   Financial Resource Strain: Not on file  Food Insecurity: Not on file  Transportation Needs: Not on file  Physical Activity: Not on file  Stress: Not on file  Social Connections: Not on file  Intimate Partner Violence: Not on file    History reviewed. No pertinent family history.   Current Outpatient Medications:    dorzolamide (TRUSOPT) 2 % ophthalmic solution, 1 drop 3 (three) times daily., Disp: , Rfl:    metoprolol succinate (TOPROL-XL) 50 MG 24 hr tablet, Take 1 tablet by mouth 1 day or 1 dose., Disp: , Rfl:    NOVOLOG MIX 70/30 FLEXPEN (70-30) 100 UNIT/ML FlexPen, , Disp: , Rfl:     pantoprazole (PROTONIX) 40 MG tablet, Take 1 tablet (40 mg total) by mouth 2 (two) times daily before a meal., Disp: 60 tablet, Rfl: 0   rosuvastatin (CRESTOR) 10 MG tablet, , Disp: , Rfl:    SIMBRINZA 1-0.2 % SUSP, , Disp: , Rfl:    traZODone (DESYREL) 50 MG tablet, Take 50 mg by mouth at bedtime as needed for sleep. , Disp: , Rfl:    oxyCODONE (OXY IR/ROXICODONE) 5 MG immediate release tablet, Take 1 tablet (5 mg total) by mouth every 8 (eight) hours as needed for severe pain. (Patient not taking: No sig reported), Disp: 90 tablet, Rfl: 0   ROCKLATAN 0.02-0.005 % SOLN, , Disp: , Rfl:    tamsulosin (FLOMAX) 0.4 MG CAPS capsule, Take 0.4 mg by mouth. (Patient not taking: Reported on 11/17/2020), Disp: , Rfl:  No current facility-administered medications for this visit.  Facility-Administered Medications Ordered in Other Visits:    heparin lock flush 100 unit/mL, 500 Units, Intravenous, Once, Sindy Guadeloupe, MD   sodium chloride flush (NS) 0.9 % injection 10 mL, 10 mL, Intravenous, Once, Sindy Guadeloupe, MD   sodium chloride flush (NS) 0.9 % injection 10 mL, 10 mL, Intravenous, Once, Sindy Guadeloupe, MD   sodium chloride flush (NS) 0.9 % injection 10 mL, 10 mL, Intravenous, PRN, Sindy Guadeloupe, MD  Physical exam:  Vitals:   05/21/21 1258  BP: (!) 180/72  Pulse: (!) 52  Resp: 18  Temp: (!) 97.2 F (36.2 C)  SpO2: 97%  Weight: 172 lb (78 kg)   Physical Exam Cardiovascular:     Rate and Rhythm: Normal rate and regular rhythm.     Heart sounds: Normal heart sounds.  Pulmonary:     Effort: Pulmonary effort is normal.     Breath sounds: Normal breath sounds.  Abdominal:     General: Bowel sounds are normal.     Palpations: Abdomen is soft.     Comments: Colostomy in place  Skin:    General: Skin is warm and dry.  Neurological:     Mental Status: He is alert and oriented to person, place, and time.     CMP Latest Ref Rng & Units 05/17/2021  Glucose 70 - 99 mg/dL 161(H)  BUN 8 - 23  mg/dL 17  Creatinine 0.61 - 1.24 mg/dL 1.15  Sodium 135 - 145 mmol/L 135  Potassium 3.5 - 5.1 mmol/L 4.1  Chloride 98 - 111 mmol/L 102  CO2 22 - 32 mmol/L 26  Calcium 8.9 - 10.3 mg/dL 9.2  Total Protein 6.5 - 8.1 g/dL 7.5  Total Bilirubin 0.3 - 1.2 mg/dL 0.5  Alkaline Phos 38 - 126 U/L 74  AST 15 - 41 U/L 23  ALT 0 - 44 U/L 31   CBC Latest Ref Rng & Units 05/17/2021  WBC 4.0 - 10.5 K/uL 3.5(L)  Hemoglobin 13.0 - 17.0 g/dL 14.3  Hematocrit 39.0 - 52.0 % 43.8  Platelets 150 - 400 K/uL 185   No results found for: CEA  Component Ref Range & Units 4 d ago (05/17/21) 6 mo ago (11/17/20) 12 mo ago (05/26/20) 1 yr ago (01/28/20) 2 yr ago (05/03/19) 2 yr ago (04/14/19) 2 yr ago (11/09/18)  CEA 0.0 - 4.7 ng/mL 0.6  0.9 CM  0.6 CM  1.0 CM  1.7 CM  2.1 CM  2.4 CM   Comment: (NOTE)                              Nonsmokers          <3.9                              Smokers             <5.6    No images are attached to the encounter.  CT CHEST ABDOMEN PELVIS W CONTRAST  Result Date: 05/17/2021 CLINICAL DATA:  Follow-up rectal cancer. Status post concurrent chemoradiation therapy and definitive surgical therapy for stage III B adenocarcinoma of the rectum. EXAM: CT CHEST, ABDOMEN, AND PELVIS WITH CONTRAST TECHNIQUE: Multidetector CT imaging of the chest, abdomen and pelvis was performed following the standard protocol during bolus administration of intravenous contrast. RADIATION DOSE REDUCTION: This exam was performed according to the departmental dose-optimization program which includes automated exposure control, adjustment of the mA and/or kV according to patient size and/or use of iterative reconstruction technique. CONTRAST:  165m OMNIPAQUE IOHEXOL 300 MG/ML  SOLN COMPARISON:  Multiple priors including most recent CT November 15, 2020 FINDINGS: CT CHEST FINDINGS Cardiovascular: Aortic atherosclerosis without aneurysmal dilation. No central pulmonary embolus on this nondedicated study. Coronary artery  calcifications. Normal size heart. No significant pericardial effusion/thickening. Mediastinum/Nodes: No supraclavicular adenopathy. No discrete thyroid nodule. No pathologically enlarged mediastinal, hilar or axillary lymph nodes. Lungs/Pleura: No suspicious pulmonary nodules or masses. No focal airspace consolidation. No pneumothorax. No pleural effusion. Musculoskeletal: Thoracic spondylosis. No aggressive lytic or blastic lesion of bone. CT ABDOMEN PELVIS FINDINGS Hepatobiliary: No suspicious hepatic lesion. Gallbladder is unremarkable. No biliary ductal dilation. Pancreas: No pancreatic ductal dilation or evidence of acute inflammation. Spleen: No splenomegaly. Adrenals/Urinary Tract: Bilateral adrenal glands appear normal. No hydronephrosis. Right renal cyst. Kidneys demonstrate symmetric enhancement and excretion of contrast material. Urinary bladder is nondistended. Stomach/Bowel: Radiopaque enteric contrast material traverses the left anterior abdominal wall colostomy. Stomach is moderately distended without wall thickening. No pathologic dilation of small or large bowel. Colonic diverticulosis without findings of acute diverticulitis. Postsurgical changes of low anterior resection with Hartmann's pouch formation and left anterior abdominal wall colostomy. Similar ill-defined fat stranding, fluid and soft tissue density in the resection bed/presacral space without discrete enhancing soft tissue nodularity, most consistent with postsurgical/post treatment change. Vascular/Lymphatic: Aortic and branch vessel atherosclerosis without aneurysmal dilation. No pathologically enlarged abdominal or pelvic lymph nodes. Reproductive: Prostatectomy. Other: Small fat containing right inguinal hernia. No discrete peritoneal or omental nodularity. Musculoskeletal: No aggressive lytic or blastic lesion of bone. Multilevel degenerative changes spine. Degenerative changes bilateral hips and SI joints. IMPRESSION: 1. Stable  examination without evidence of new or progressive findings in the chest, abdomen or pelvis to suggest recurrent or metastatic disease. 2. Left abdominal wall colostomy and Hartman's pouch without evidence of complication. 3. Colonic diverticulosis without findings of acute diverticulitis. 4. Aortic Atherosclerosis (ICD10-I70.0). Electronically Signed   By: JDahlia BailiffM.D.   On: 05/17/2021 13:15     Assessment and plan- Patient is a  81 y.o. male with stage III rectal adenocarcinoma s/p total neoadjuvant treatment with FOLFOX chemotherapy 4 months followed by concurrent chemoradiation.  Final pathology showed poor response to treatment ypT3ApN1a.  This is a routine surveillance visit for rectal cancer  Clinically, doing well without signs or symptoms of recurrence. CT chest abdomen pelvis from March 2023 reviewed independently and I agree that it does not show any evidence of recurrent disease. He has also had interval colonoscopy. I also reviewed colonoscopy report which was found to be widely patent, many small and large mouthed diverticula in entire colon, 4 mm polyp in ascending colon, was removed. Pathology was: polypoid benign colonic mucosa negative for dysplasia or malignancy. Recommendation is to repeat colonoscopy in 1 year.  We reviewed NCCN guidelines which includes history and physical exam every 3-6 months for the first 2 years then every 6 months for total of 5 years.  Recommend including blood work including CEA.  Plan for CT chest abdomen pelvis every 6 to 12 months for total of 5 years.  Hypertension- bp elevated in clinic. Asymptomatic. Encouraged him to check blood pressure at home. Follow up with cardiology.   Disposition: 6 mo- lab (cbc, cmp, cea), CT chest/abdomen/pelvis Day to week later see Dr. Janese Banks for follow-up- la  Visit Diagnosis 1. 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[place])   2. Encounter for follow-up surveillance of rectal cancer    Beckey Rutter, [place], AGNP-C [place] at [facility] [phone] (clinic) 05/21/2021

## 2021-05-21 NOTE — Progress Notes (Signed)
Pt states his BP has been elevated recently, he is being seen by cardiology on Wednesday.  ?

## 2021-05-22 ENCOUNTER — Other Ambulatory Visit: Payer: Self-pay

## 2021-05-22 ENCOUNTER — Emergency Department: Payer: Medicare PPO

## 2021-05-22 ENCOUNTER — Emergency Department
Admission: EM | Admit: 2021-05-22 | Discharge: 2021-05-22 | Disposition: A | Discharge status: Home / Self Care | Payer: Medicare PPO | Attending: Emergency Medicine | Admitting: Emergency Medicine

## 2021-05-22 DIAGNOSIS — Z85038 Personal history of other malignant neoplasm of large intestine: Secondary | ICD-10-CM | POA: Insufficient documentation

## 2021-05-22 DIAGNOSIS — I10 Essential (primary) hypertension: Secondary | ICD-10-CM | POA: Diagnosis present

## 2021-05-22 DIAGNOSIS — E119 Type 2 diabetes mellitus without complications: Secondary | ICD-10-CM | POA: Diagnosis not present

## 2021-05-22 DIAGNOSIS — I2510 Atherosclerotic heart disease of native coronary artery without angina pectoris: Secondary | ICD-10-CM | POA: Diagnosis not present

## 2021-05-22 DIAGNOSIS — Z8546 Personal history of malignant neoplasm of prostate: Secondary | ICD-10-CM | POA: Diagnosis not present

## 2021-05-22 DIAGNOSIS — Z85048 Personal history of other malignant neoplasm of rectum, rectosigmoid junction, and anus: Secondary | ICD-10-CM | POA: Insufficient documentation

## 2021-05-22 LAB — CBC
HCT: 44.2 % (ref 39.0–52.0)
Hemoglobin: 14.3 g/dL (ref 13.0–17.0)
MCH: 28.7 pg (ref 26.0–34.0)
MCHC: 32.4 g/dL (ref 30.0–36.0)
MCV: 88.8 fL (ref 80.0–100.0)
Platelets: 174 10*3/uL (ref 150–400)
RBC: 4.98 MIL/uL (ref 4.22–5.81)
RDW: 13.8 % (ref 11.5–15.5)
WBC: 4 10*3/uL (ref 4.0–10.5)
nRBC: 0 % (ref 0.0–0.2)

## 2021-05-22 LAB — BASIC METABOLIC PANEL
Anion gap: 9 (ref 5–15)
BUN: 23 mg/dL (ref 8–23)
CO2: 22 mmol/L (ref 22–32)
Calcium: 9.1 mg/dL (ref 8.9–10.3)
Chloride: 105 mmol/L (ref 98–111)
Creatinine, Ser: 1.23 mg/dL (ref 0.61–1.24)
GFR, Estimated: 59 mL/min — ABNORMAL LOW (ref >60)
Glucose, Bld: 217 mg/dL — ABNORMAL HIGH (ref 70–99)
Potassium: 3.9 mmol/L (ref 3.5–5.1)
Sodium: 136 mmol/L (ref 135–145)

## 2021-05-22 LAB — TROPONIN I (HIGH SENSITIVITY): Troponin I (High Sensitivity): 5 ng/L (ref <18)

## 2021-05-22 NOTE — Discharge Instructions (Signed)
Please follow-up with your Cardiologist tomorrow as scheduled to discuss your blood pressure.  ?

## 2021-05-22 NOTE — ED Triage Notes (Signed)
Patient to ER via POV with complaints of dizziness, hypertension, and chest pain. Reports waking up this morning feeling badly. Describes having left sided, non-radiating chest heaviness. States he used his home BP cuff and readings were as high as 202 systolic.  ?

## 2021-05-22 NOTE — ED Notes (Signed)
Family member came to ask this RN if pt can have anything to eat. This RN advised that pt should not have anything until all tests are back. RN will ask MD once she is available. Family member agrees with plan. ?

## 2021-05-22 NOTE — ED Notes (Signed)
Pt placed on BP, cardiac, pulse ox monitors. ?

## 2021-05-22 NOTE — ED Provider Notes (Signed)
? ?[facility] ?Provider Note ? ? ? Event Date/Time  ? First MD Initiated Contact with Patient 05/22/21 1452   ?  (approximate) ? ? ?History  ? ?Hypertension ? ? ?HPI ? ?Willie [name] is a 81 y.o. male with past medical history of coronary artery disease, hypertension, colon cancer status post colostomy who presents with elevated blood pressure.  Patient got out of bed today felt lightheaded for short period time and then checked his blood pressure and it was in the 654Y systolic.  He notes that it has not really been elevated in the past except for several days ago when he had a colonoscopy and then when he had a CAT scan and saw his oncologist.  Previously he did not check his blood pressure at home.  He denies chest pain shortness of breath headache nausea vomiting weakness.  Feels somewhat lightheaded upon standing on occasion but denies any vertiginous symptoms.  No diplopia dysarthria.  Patient has pain that has been more chronic in the left shoulder is worse at night and when he rolls out of bed in the morning.  Does not radiate to the chest and is not exertional. ? ?  ? ?Past Medical History:  ?Diagnosis Date  ? Anemia   ? Coronary artery disease   ? Diabetes mellitus without complication ([place])   ? GERD (gastroesophageal reflux disease)   ? History of hiatal hernia   ? Hypercholesterolemia   ? Hypertension   ? Prostate cancer ([place]) 2000  ? Prostatectomy.   ? 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 Chemo tx's.   ? Wears dentures   ? partial lower  ? ? ?Patient Active Problem List  ? Diagnosis Date Noted  ? 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 Chemotherapy induced neutropenia ([place]) 03/11/2019  ? Hypokalemia 10/27/2018  ? Protein-calorie malnutrition, severe 10/12/2018  ? Sepsis ([place]) 10/10/2018  ? Hypercholesteremia 08/17/2018  ? Goals of care, counseling/discussion 08/05/2018  ? 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[place]) 08/05/2018   ? 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 History of gastric ulcer 07/07/2018  ? Ventricular tachycardia 10/31/2017  ? Benign prostatic hyperplasia 09/16/2017  ? Controlled type 2 diabetes mellitus without complication, with long-term current use of insulin ([place]) 09/16/2017  ? Coronary artery disease involving native heart without angina pectoris 09/16/2017  ? Essential hypertension 09/16/2017  ? Onychomycosis due to dermatophyte 05/28/2013  ? ? ? ?Physical Exam  ?Triage Vital Signs: ?ED Triage Vitals  ?Enc Vitals Group  ?   BP 05/22/21 1346 (!) 178/70  ?   Pulse Rate 05/22/21 1346 80  ?   Resp 05/22/21 1346 18  ?   Temp 05/22/21 1346 98.3 ?F (36.8 ?C)  ?   Temp src --   ?   SpO2 05/22/21 1346 100 %  ?   Weight 05/22/21 1344 172 lb (78 kg)  ?   Height 05/22/21 1344 '5\' 3"'$  (1.6 m)  ?   Head Circumference --   ?   Peak Flow --   ?   Pain Score --   ?   Pain Loc --   ?   Pain Edu? --   ?   Excl. in [place]? --   ? ? ?Most recent vital signs: ?Vitals:  ? 05/22/21 1346 05/22/21 1500  ?BP: (!) 178/70 (!) 187/71  ?Pulse: 80   ?Resp: 18   ?Temp: 98.3 ?F (  36.8 ?C)   ?SpO2: 100% 99%  ? ? ? ?General: Awake, no distress.  ?CV:  Good peripheral perfusion. No edema ?Resp:  Normal effort.  ?Abd:  No distention. Colostomy bag with brown stool ?Neuro:             Awake, Alert, Oriented x 3  ?Other:  Aox3, nml speech  ?PERRL, EOMI, face symmetric, nml tongue movement  ?5/5 strength in the BL upper and lower extremities  ?Sensation grossly intact in the BL upper and lower extremities  ?Finger-nose-finger intact BL ? ? ? ?ED Results / Procedures / Treatments  ?Labs ?(all labs ordered are listed, but only abnormal results are displayed) ?Labs Reviewed  ?BASIC METABOLIC PANEL - Abnormal; Notable for the following components:  ?    Result Value  ? Glucose, Bld 217 (*)   ? GFR, Estimated 59 (*)   ? All other components within normal limits  ?CBC  ?TROPONIN I (HIGH SENSITIVITY)  ?TROPONIN I (HIGH SENSITIVITY)   ? ? ? ?EKG ? ?EKG interpretation performed by myself: NSR, nml axis, nml intervals, no acute ischemic changes ? ? ? ?RADIOLOGY ?I reviewed the CXR which does not show any acute cardiopulmonary process; agree with radiology report  ? ? ? ?PROCEDURES: ? ?Critical Care performed: No ? ?.1-3 Lead EKG Interpretation ?Performed by: Rada Hay, MD ?Authorized by: Rada Hay, MD  ? ?  Interpretation: normal   ?  ECG rate assessment: normal   ?  Rhythm: atrial flutter   ?  Ectopy: PVCs   ?  Conduction: normal   ? ?The patient is on the cardiac monitor to evaluate for evidence of arrhythmia and/or significant heart rate changes. ? ? ?MEDICATIONS ORDERED IN ED: ?Medications - No data to display ? ? ?IMPRESSION / MDM / ASSESSMENT AND PLAN / ED COURSE  ?I reviewed the triage vital signs and the nursing notes. ?             ?               ? ?Differential diagnosis includes, but is not limited to, asymptomatic hypertension, orthostatic, vasovagal, less likely posterior CVA ? ?Patient is a 81 year old male who presents with elevated blood pressure at home.  When he woke up this morning he felt somewhat lightheaded.  Then checked his blood pressure it was 824 systolic.  He notes that he is on metoprolol but has never really had elevated blood pressure until several days ago when he had a colonoscopy and it was elevated.  He does endorse some pain in the left shoulder with movement and when he sits up in bed this is been going on for a while.  He has no exertional symptoms no chest pain dyspnea nausea vomiting.  Denies headache visual change numbness tingling or weakness.  Has been able to ambulate throughout the ED without difficulty.  Pressure is elevated 178/70, otherwise vitals are within normal limits.  He appears well on exam.  Neurologic exam is nonfocal no abnormal cerebellar findings to suggest posterior CVA.  I suspect that patient's blood pressure has been slowly elevating and that he is just now become  aware of it over the last week or so when he had doctors visits.  No signs of hypertensive emergency today I think he is appropriate discharge.  He actually has an appointment tomorrow with his cardiologist I told him to discuss blood pressure management at that time. ? ?  ? ? ?FINAL CLINICAL IMPRESSION(S) /  ED DIAGNOSES  ? ?Final diagnoses:  ?Hypertension, unspecified type  ? ? ? ?Rx / DC Orders  ? ?ED Discharge Orders   ? ? None  ? ?  ? ? ? ?Note:  This document was prepared using Dragon voice recognition software and may include unintentional dictation errors. ?  ?Rada Hay, MD ?05/22/21 1624 ? ?

## 2021-05-22 NOTE — ED Notes (Signed)
Pt unhooked from monitors to go to restroom. ?

## 2021-07-16 ENCOUNTER — Telehealth: Payer: Self-pay | Admitting: *Deleted

## 2021-07-16 NOTE — Telephone Encounter (Signed)
Patient called stating that he is going to be out of town on day of hi appointment, 5/8 and wants to reschedule it for July when he will be back in town ?

## 2021-07-20 ENCOUNTER — Encounter: Payer: Self-pay | Admitting: Oncology

## 2021-07-26 ENCOUNTER — Ambulatory Visit: Payer: Medicare PPO | Admitting: Radiation Oncology

## 2021-08-01 ENCOUNTER — Other Ambulatory Visit: Payer: Self-pay

## 2021-08-01 ENCOUNTER — Emergency Department: Payer: Medicare PPO

## 2021-08-01 ENCOUNTER — Observation Stay
Admission: EM | Admit: 2021-08-01 | Discharge: 2021-08-02 | Disposition: A | Discharge status: Home / Self Care | Payer: Medicare PPO | Attending: Internal Medicine | Admitting: Internal Medicine

## 2021-08-01 DIAGNOSIS — K6389 Other specified diseases of intestine: Secondary | ICD-10-CM | POA: Insufficient documentation

## 2021-08-01 DIAGNOSIS — Z7982 Long term (current) use of aspirin: Secondary | ICD-10-CM | POA: Diagnosis not present

## 2021-08-01 DIAGNOSIS — I4729 Other ventricular tachycardia: Secondary | ICD-10-CM | POA: Diagnosis present

## 2021-08-01 DIAGNOSIS — R760 Raised antibody titer: Secondary | ICD-10-CM | POA: Insufficient documentation

## 2021-08-01 DIAGNOSIS — N179 Acute kidney failure, unspecified: Secondary | ICD-10-CM | POA: Diagnosis not present

## 2021-08-01 DIAGNOSIS — Z85038 Personal history of other malignant neoplasm of large intestine: Secondary | ICD-10-CM | POA: Insufficient documentation

## 2021-08-01 DIAGNOSIS — Z933 Colostomy status: Secondary | ICD-10-CM | POA: Insufficient documentation

## 2021-08-01 DIAGNOSIS — D500 Iron deficiency anemia secondary to blood loss (chronic): Secondary | ICD-10-CM | POA: Insufficient documentation

## 2021-08-01 DIAGNOSIS — I10 Essential (primary) hypertension: Secondary | ICD-10-CM | POA: Diagnosis present

## 2021-08-01 DIAGNOSIS — Z9889 Other specified postprocedural states: Secondary | ICD-10-CM | POA: Diagnosis not present

## 2021-08-01 DIAGNOSIS — N189 Chronic kidney disease, unspecified: Secondary | ICD-10-CM

## 2021-08-01 DIAGNOSIS — N1831 Chronic kidney disease, stage 3a: Secondary | ICD-10-CM | POA: Insufficient documentation

## 2021-08-01 DIAGNOSIS — C187 Malignant neoplasm of sigmoid colon: Secondary | ICD-10-CM | POA: Insufficient documentation

## 2021-08-01 DIAGNOSIS — I472 Ventricular tachycardia, unspecified: Secondary | ICD-10-CM | POA: Diagnosis not present

## 2021-08-01 DIAGNOSIS — R2689 Other abnormalities of gait and mobility: Secondary | ICD-10-CM | POA: Insufficient documentation

## 2021-08-01 DIAGNOSIS — R109 Unspecified abdominal pain: Principal | ICD-10-CM | POA: Insufficient documentation

## 2021-08-01 DIAGNOSIS — E1122 Type 2 diabetes mellitus with diabetic chronic kidney disease: Secondary | ICD-10-CM | POA: Insufficient documentation

## 2021-08-01 DIAGNOSIS — E876 Hypokalemia: Secondary | ICD-10-CM | POA: Diagnosis not present

## 2021-08-01 DIAGNOSIS — Z7984 Long term (current) use of oral hypoglycemic drugs: Secondary | ICD-10-CM | POA: Insufficient documentation

## 2021-08-01 DIAGNOSIS — Z9049 Acquired absence of other specified parts of digestive tract: Secondary | ICD-10-CM | POA: Diagnosis not present

## 2021-08-01 DIAGNOSIS — K5730 Diverticulosis of large intestine without perforation or abscess without bleeding: Secondary | ICD-10-CM | POA: Diagnosis not present

## 2021-08-01 DIAGNOSIS — Z794 Long term (current) use of insulin: Secondary | ICD-10-CM | POA: Insufficient documentation

## 2021-08-01 DIAGNOSIS — Z8546 Personal history of malignant neoplasm of prostate: Secondary | ICD-10-CM | POA: Insufficient documentation

## 2021-08-01 DIAGNOSIS — Z85048 Personal history of other malignant neoplasm of rectum, rectosigmoid junction, and anus: Secondary | ICD-10-CM | POA: Insufficient documentation

## 2021-08-01 DIAGNOSIS — E119 Type 2 diabetes mellitus without complications: Secondary | ICD-10-CM

## 2021-08-01 DIAGNOSIS — I129 Hypertensive chronic kidney disease with stage 1 through stage 4 chronic kidney disease, or unspecified chronic kidney disease: Secondary | ICD-10-CM | POA: Insufficient documentation

## 2021-08-01 DIAGNOSIS — R768 Other specified abnormal immunological findings in serum: Secondary | ICD-10-CM | POA: Diagnosis present

## 2021-08-01 DIAGNOSIS — I2510 Atherosclerotic heart disease of native coronary artery without angina pectoris: Secondary | ICD-10-CM | POA: Insufficient documentation

## 2021-08-01 DIAGNOSIS — R112 Nausea with vomiting, unspecified: Secondary | ICD-10-CM | POA: Insufficient documentation

## 2021-08-01 DIAGNOSIS — Z79899 Other long term (current) drug therapy: Secondary | ICD-10-CM | POA: Diagnosis not present

## 2021-08-01 LAB — COMPREHENSIVE METABOLIC PANEL
ALT: 18 U/L (ref 0–44)
AST: 26 U/L (ref 15–41)
Albumin: 4.1 g/dL (ref 3.5–5.0)
Alkaline Phosphatase: 71 U/L (ref 38–126)
Anion gap: 14 (ref 5–15)
BUN: 29 mg/dL — ABNORMAL HIGH (ref 8–23)
CO2: 19 mmol/L — ABNORMAL LOW (ref 22–32)
Calcium: 9.6 mg/dL (ref 8.9–10.3)
Chloride: 106 mmol/L (ref 98–111)
Creatinine, Ser: 1.47 mg/dL — ABNORMAL HIGH (ref 0.61–1.24)
GFR, Estimated: 47 mL/min — ABNORMAL LOW (ref >60)
Glucose, Bld: 142 mg/dL — ABNORMAL HIGH (ref 70–99)
Potassium: 4.9 mmol/L (ref 3.5–5.1)
Sodium: 139 mmol/L (ref 135–145)
Total Bilirubin: 0.9 mg/dL (ref 0.3–1.2)
Total Protein: 8.4 g/dL — ABNORMAL HIGH (ref 6.5–8.1)

## 2021-08-01 LAB — CBC
HCT: 47.5 % (ref 39.0–52.0)
Hemoglobin: 15.2 g/dL (ref 13.0–17.0)
MCH: 28.6 pg (ref 26.0–34.0)
MCHC: 32 g/dL (ref 30.0–36.0)
MCV: 89.5 fL (ref 80.0–100.0)
Platelets: 207 10*3/uL (ref 150–400)
RBC: 5.31 MIL/uL (ref 4.22–5.81)
RDW: 14.2 % (ref 11.5–15.5)
WBC: 5.1 10*3/uL (ref 4.0–10.5)
nRBC: 0 % (ref 0.0–0.2)

## 2021-08-01 LAB — CBG MONITORING, ED: Glucose-Capillary: 134 mg/dL — ABNORMAL HIGH (ref 70–99)

## 2021-08-01 LAB — LIPASE, BLOOD: Lipase: 26 U/L (ref 11–51)

## 2021-08-01 MED ORDER — ASPIRIN 81 MG PO TBEC
81.0000 mg | DELAYED_RELEASE_TABLET | Freq: Every day | ORAL | Status: DC
  Administered 2021-08-02: 81 mg via ORAL
  Filled 2021-08-01: qty 1

## 2021-08-01 MED ORDER — BRIMONIDINE TARTRATE 0.2 % OP SOLN
1.0000 [drp] | Freq: Three times a day (TID) | OPHTHALMIC | Status: DC
  Administered 2021-08-02 (×2): 1 [drp] via OPHTHALMIC
  Filled 2021-08-01: qty 5

## 2021-08-01 MED ORDER — SODIUM CHLORIDE 0.9 % IV SOLN: Freq: Once | INTRAVENOUS | Status: AC

## 2021-08-01 MED ORDER — DORZOLAMIDE HCL 2 % OP SOLN: 1.0000 [drp] | Freq: Three times a day (TID) | OPHTHALMIC | Status: DC

## 2021-08-01 MED ORDER — MORPHINE SULFATE (PF) 4 MG/ML IV SOLN
4.0000 mg | Freq: Once | INTRAVENOUS | Status: AC
  Administered 2021-08-01: 4 mg via INTRAVENOUS
  Filled 2021-08-01: qty 1

## 2021-08-01 MED ORDER — HYDRALAZINE HCL 20 MG/ML IJ SOLN: 5.0000 mg | INTRAMUSCULAR | Status: DC | PRN

## 2021-08-01 MED ORDER — METOPROLOL SUCCINATE ER 50 MG PO TB24
50.0000 mg | ORAL_TABLET | Freq: Every day | ORAL | Status: DC
  Administered 2021-08-01 – 2021-08-02 (×2): 50 mg via ORAL
  Filled 2021-08-01 (×2): qty 1

## 2021-08-01 MED ORDER — LACTATED RINGERS IV SOLN: INTRAVENOUS | Status: DC

## 2021-08-01 MED ORDER — ALBUTEROL SULFATE (2.5 MG/3ML) 0.083% IN NEBU: 2.5000 mg | INHALATION_SOLUTION | RESPIRATORY_TRACT | Status: DC | PRN

## 2021-08-01 MED ORDER — SODIUM CHLORIDE 0.9% FLUSH
3.0000 mL | Freq: Two times a day (BID) | INTRAVENOUS | Status: DC
  Administered 2021-08-01 – 2021-08-02 (×2): 3 mL via INTRAVENOUS

## 2021-08-01 MED ORDER — PANTOPRAZOLE SODIUM 40 MG IV SOLR
40.0000 mg | Freq: Two times a day (BID) | INTRAVENOUS | Status: DC
  Administered 2021-08-01 – 2021-08-02 (×2): 40 mg via INTRAVENOUS
  Filled 2021-08-01 (×2): qty 10

## 2021-08-01 MED ORDER — ACETAMINOPHEN 650 MG RE SUPP: 650.0000 mg | Freq: Four times a day (QID) | RECTAL | Status: DC | PRN

## 2021-08-01 MED ORDER — ONDANSETRON HCL 4 MG PO TABS: 4.0000 mg | ORAL_TABLET | Freq: Four times a day (QID) | ORAL | Status: DC | PRN

## 2021-08-01 MED ORDER — ROSUVASTATIN CALCIUM 10 MG PO TABS
10.0000 mg | ORAL_TABLET | Freq: Every day | ORAL | Status: DC
  Administered 2021-08-02 (×2): 10 mg via ORAL
  Filled 2021-08-01 (×2): qty 1

## 2021-08-01 MED ORDER — HEPARIN SODIUM (PORCINE) 5000 UNIT/ML IJ SOLN
5000.0000 [IU] | Freq: Two times a day (BID) | INTRAMUSCULAR | Status: DC
  Administered 2021-08-01 – 2021-08-02 (×2): 5000 [IU] via SUBCUTANEOUS
  Filled 2021-08-01 (×2): qty 1

## 2021-08-01 MED ORDER — IOHEXOL 300 MG/ML  SOLN
100.0000 mL | Freq: Once | INTRAMUSCULAR | Status: AC | PRN
  Administered 2021-08-01: 100 mL via INTRAVENOUS

## 2021-08-01 MED ORDER — INSULIN ASPART 100 UNIT/ML IJ SOLN
0.0000 [IU] | Freq: Three times a day (TID) | INTRAMUSCULAR | Status: DC
  Administered 2021-08-02: 5 [IU] via SUBCUTANEOUS
  Filled 2021-08-01: qty 1

## 2021-08-01 MED ORDER — ONDANSETRON HCL 4 MG/2ML IJ SOLN
4.0000 mg | Freq: Once | INTRAMUSCULAR | Status: AC
Start: 2021-08-01 — End: 2021-08-01
  Administered 2021-08-01: 4 mg via INTRAVENOUS
  Filled 2021-08-01: qty 2

## 2021-08-01 MED ORDER — ACETAMINOPHEN 325 MG PO TABS: 650.0000 mg | ORAL_TABLET | Freq: Four times a day (QID) | ORAL | Status: DC | PRN

## 2021-08-01 MED ORDER — ONDANSETRON HCL 4 MG/2ML IJ SOLN: 4.0000 mg | Freq: Four times a day (QID) | INTRAMUSCULAR | Status: DC | PRN

## 2021-08-01 NOTE — Assessment & Plan Note (Addendum)
Cont asa,statin, metoprolol.

## 2021-08-01 NOTE — Assessment & Plan Note (Signed)
Type  And screec

## 2021-08-01 NOTE — Assessment & Plan Note (Addendum)
Suspect due to transient ileus. - ostomy function returned prior to discharge and patient able to tolerate diet with no nausea or vomiting

## 2021-08-01 NOTE — Assessment & Plan Note (Signed)
Lab Results  Component Value Date   CREATININE 1.47 (H) 08/01/2021   CREATININE 1.23 05/22/2021   CREATININE 1.15 05/17/2021  We will hold BP meds. Cont with MIVF.  Renally dose needed meds.

## 2021-08-01 NOTE — Assessment & Plan Note (Signed)
Il;eus probably from adhesions. If vomiting rescuers low teasels  ot gen surgery.

## 2021-08-01 NOTE — ED Triage Notes (Signed)
Pt comes with c/o vomiting that started this past Sunday.  Pt has colostomy and was having stool all weekend but nothing today. Pt is vomiting heavily in triage.  Pt states belly pain and foul odor with vomit.

## 2021-08-01 NOTE — Assessment & Plan Note (Addendum)
H/o vent tach - monitor telemetry.  Continue home regimen of aspirin 81, and metoprolol.

## 2021-08-01 NOTE — ED Notes (Signed)
ED Provider at bedside. 

## 2021-08-01 NOTE — ED Notes (Signed)
Dr. Patel at bedside 

## 2021-08-01 NOTE — Assessment & Plan Note (Signed)
    Latest Ref Rng & Units 08/01/2021    5:35 PM 05/22/2021    1:48 PM 05/17/2021    9:04 AM  CBC  WBC 4.0 - 10.5 K/uL 5.1   4.0   3.5    Hemoglobin 13.0 - 17.0 g/dL 15.2   14.3   14.3    Hematocrit 39.0 - 52.0 % 47.5   44.2   43.8    Platelets 150 - 400 K/uL 207   174   185    Hemoglobin is stable we will follow.

## 2021-08-01 NOTE — H&P (Addendum)
History and Physical    Patient: Willie [name] PPI:[id number] DOB: 11-30-1940 DOA: 08/01/2021 DOS: the patient was seen and examined on 08/01/2021 PCP: [name], MD  Patient coming from: Home  Chief Complaint:  Chief Complaint  Patient presents with   Abdominal Pain   Emesis   HPI: Willie [name] is a 81 y.o. male with medical history significant of colon cancer, colectomy, anemia, CAD, diabetes mellitus type 2 presenting with abdominal pain and intractable vomiting.  Abdomen going on since last Sunday.  Patient had some guests over and they were traveling where he had meatloaf from [employer] twice and shrimp linguini also from [employer].  Since then patient has been having vomiting and has not been able to keep anything down.  Patient also some soft stool but not diarrhea.  No blood in the stool and since yesterday has not had a bowel movement. Patient denies any weight loss melena bleeding intermittent changes in bowel habits.  Patient states that it feels like he was backed up and was not able to have a bowel movement he felt bloated.   Review of Systems: As mentioned in the history of present illness. All other systems reviewed and are negative. Past Medical History:  Diagnosis Date   Anemia    Coronary artery disease    Diabetes mellitus without complication (HCC)    GERD (gastroesophageal reflux disease)    History of hiatal hernia    Hypercholesterolemia    Hypertension    Prostate cancer ([place]) 2000   Prostatectomy.    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[place]) 09/2018   Chemo tx's.    Wears dentures    partial lower   Past Surgical History:  Procedure Laterality Date   CATARACT EXTRACTION W/PHACO Right 10/12/2019   Procedure: CATARACT EXTRACTION PHACO AND INTRAOCULAR LENS PLACEMENT (IOC) RIGHT DIABETIC 6.35 00:37.1;  Surgeon: Birder Robson, MD;  Location: [facility];  Service: Ophthalmology;  Laterality: Right;   COLONOSCOPY N/A 05/02/2021   Procedure:  COLONOSCOPY;  Surgeon: [name], [name], MD;  Location: [facility] ENDOSCOPY;  Service: Gastroenterology;  Laterality: N/A;  IDDM   COLONOSCOPY WITH PROPOFOL N/A 07/15/2018   Procedure: COLONOSCOPY WITH PROPOFOL;  Surgeon: [name], [name], MD;  Location: [facility] ENDOSCOPY;  Service: Gastroenterology;  Laterality: N/A;   ESOPHAGOGASTRODUODENOSCOPY N/A 10/12/2018   Procedure: ESOPHAGOGASTRODUODENOSCOPY (EGD);  Surgeon: [name], MD;  Location[facility] ENDOSCOPY;  Service: Gastroenterology;  Laterality: N/A;   ESOPHAGOGASTRODUODENOSCOPY (EGD) WITH PROPOFOL N/A 07/15/2018   Procedure: ESOPHAGOGASTRODUODENOSCOPY (EGD) WITH PROPOFOL;  Surgeon: [name], [name], MD;  Location: [facility] ENDOSCOPY;  Service: Gastroenterology;  Laterality: N/A;   PORTACATH PLACEMENT N/A 08/13/2018   Procedure: INSERTION PORT-A-CATH;  Surgeon: [name], MD;  Location: [facility] ORS;  Service: General;  Laterality: N/A;   [place] PARTIAL COLECTOMY  05/12/2019   [place]   Social History:  reports that he has never smoked. He has never used smokeless tobacco. He reports that he does not currently use alcohol. He reports that he does not use drugs.  No Known Allergies  No family history on file.  Prior to Admission medications   Medication Sig Start Date End Date Taking? Authorizing Provider  dorzolamide (TRUSOPT) 2 % ophthalmic solution 1 drop 3 (three) times daily.    [provider]  metoprolol succinate (TOPROL-XL) 50 MG 24 hr tablet Take 1 tablet by mouth 1 day or 1 dose. 09/02/18   [provider]  NOVOLOG MIX 70/30  FLEXPEN (70-30) 100 UNIT/ML FlexPen  12/13/19   [provider]  oxyCODONE (OXY IR/ROXICODONE) 5 MG immediate release tablet Take 1 tablet (5 mg total) by mouth every 8 (eight) hours as needed for severe pain. Patient not taking: No sig reported 01/28/20   Sindy Guadeloupe, MD  pantoprazole (PROTONIX) 40 MG tablet Take 1 tablet (40 mg total) by  mouth 2 (two) times daily before a meal. 10/19/18   Sindy Guadeloupe, MD  ROCKLATAN 0.02-0.005 % SOLN  09/18/19   [provider]  rosuvastatin (CRESTOR) 10 MG tablet  12/08/19   [provider]  [place] 1-0.2 % SUSP  09/17/19   [provider]  tamsulosin (FLOMAX) 0.4 MG CAPS capsule Take 0.4 mg by mouth. Patient not taking: Reported on 11/17/2020    [provider]  traZODone (DESYREL) 50 MG tablet Take 50 mg by mouth at bedtime as needed for sleep.     [provider]  prochlorperazine (COMPAZINE) 10 MG tablet TAKE 1 TABLET BY MOUTH EVERY 6 HOURS AS NEEDED NAUSEA AND VOMITING 11/06/18 12/28/18  Sindy Guadeloupe, MD    Physical Exam: Vitals:   08/01/21 1726 08/01/21 1935 08/01/21 2112 08/01/21 2215  BP: (!) 190/88 (!) 143/88 (!) 170/85 (!) 164/51  Pulse: 80 72 94 82  Resp: '20 20 17 15  ' Temp: 98.3 F (36.8 C)  98.3 F (36.8 C)   TempSrc: Oral     SpO2: 98% 99% 98% 97%  Weight: 75.8 kg     Height: '5\' 3"'  (1.6 m)     In the emergency room patient was initially met SIRS criteria. Blood work shows: CMP with acute kidney injury 1.47 creatinine bicarb 19 electrolytes otherwise. CBC is within normal limits. Patient has a history of C. difficile in 2020. CT imaging of the abdomen shows mild fluid-filled distended small bowel in the pelvis with mucosal otherwise nondilated and nondistended favoring ileus or enteritis or partial obstruction.  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 Patient in the emergency room received morphine Zofran and IV fluids.   Data Reviewed: Results for orders placed or performed during the hospital encounter of 08/01/21 (from the past 24 hour(s))  CBC     Status: None   Collection Time: 08/01/21  5:35 PM  Result Value Ref Range   WBC 5.1 4.0 - 10.5 K/uL   RBC 5.31 4.22 - 5.81 MIL/uL   Hemoglobin 15.2 13.0 - 17.0 g/dL   HCT 47.5 39.0 - 52.0 %   MCV 89.5 80.0 - 100.0 fL    MCH 28.6 26.0 - 34.0 pg   MCHC 32.0 30.0 - 36.0 g/dL   RDW 14.2 11.5 - 15.5 %   Platelets 207 150 - 400 K/uL   nRBC 0.0 0.0 - 0.2 %  Comprehensive metabolic panel     Status: Abnormal   Collection Time: 08/01/21  6:24 PM  Result Value Ref Range   Sodium 139 135 - 145 mmol/L   Potassium 4.9 3.5 - 5.1 mmol/L   Chloride 106 98 - 111 mmol/L   CO2 19 (L) 22 - 32 mmol/L   Glucose, Bld 142 (H) 70 - 99 mg/dL   BUN 29 (H) 8 - 23 mg/dL   Creatinine, Ser 1.47 (H) 0.61 - 1.24 mg/dL   Calcium 9.6 8.9 - 10.3 mg/dL   Total Protein 8.4 (H) 6.5 - 8.1 g/dL   Albumin 4.1 3.5 - 5.0 g/dL   AST 26 15 - 41  U/L   ALT 18 0 - 44 U/L   Alkaline Phosphatase 71 38 - 126 U/L   Total Bilirubin 0.9 0.3 - 1.2 mg/dL   GFR, Estimated 47 (L) >60 mL/min   Anion gap 14 5 - 15  Lipase, blood     Status: None   Collection Time: 08/01/21  6:24 PM  Result Value Ref Range   Lipase 26 11 - 51 U/L  CBG monitoring, ED     Status: Abnormal   Collection Time: 08/01/21  9:08 PM  Result Value Ref Range   Glucose-Capillary 134 (H) 70 - 99 mg/dL    Assessment and Plan: * Abdominal pain Suspect due to ileus. Will keep pt npo except for clear liquids.  MIVF.  [place] electrolytes.  gen surg. Gi as deemed appropriate.    Acute kidney injury superimposed on chronic kidney disease ([place]) Lab Results  Component Value Date   CREATININE 1.47 (H) 08/01/2021   CREATININE 1.23 05/22/2021   CREATININE 1.15 05/17/2021  We will hold BP meds. Cont with MIVF.  Renally dose needed meds.     Red blood cell antibody positive, 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 Type  And screec  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HCC) Il;eus probably from adhesions. If vomiting rescuers low teasels  ot gen surgery.    Coronary artery disease involving native heart without angina pectoris Cont asa,statin, metoprolol.   Controlled type 2 diabetes mellitus without complication, with long-term current use of insulin ([place]) Continue patient on aspirin,  statin.  Iron deficiency anemia secondary to blood loss (chronic)    Latest Ref Rng & Units 08/01/2021    5:35 PM 05/22/2021    1:48 PM 05/17/2021    9:04 AM  CBC  WBC 4.0 - 10.5 K/uL 5.1   4.0   3.5    Hemoglobin 13.0 - 17.0 g/dL 15.2   14.3   14.3    Hematocrit 39.0 - 52.0 % 47.5   44.2   43.8    Platelets 150 - 400 K/uL 207   174   185    Hemoglobin is stable we will follow.  Ventricular tachycardia ([place]) H/o vent tach - monitor telemetry.  Continue home regimen of aspirin 81, and metoprolol.     Advance Care Planning:    Code Status: Full Code    Consults:  None    Family Communication:  Lorinda Creed [facility])  [phone] ([place])  Severity of Illness: The appropriate patient status for this patient is OBSERVATION. Observation status is judged to be reasonable and necessary in order to provide the required intensity of service to ensure the patient's safety. The patient's presenting symptoms, physical exam findings, and initial radiographic and laboratory data in the context of their medical condition is felt to place them at decreased risk for further clinical deterioration. Furthermore, it is anticipated that the patient will be medically stable for discharge from the hospital within 2 midnights of admission.   Author: Para Skeans, MD 08/01/2021 10:31 PM  For on call review [email or web address].

## 2021-08-01 NOTE — Assessment & Plan Note (Signed)
Continue patient on aspirin, statin.

## 2021-08-01 NOTE — ED Provider Notes (Signed)
[facility] Provider Note    Event Date/Time   First MD Initiated Contact with Patient 08/01/21 1742     (approximate)   History   Abdominal Pain and Emesis   HPI  Willie [name] is a 81 y.o. male  who, per primary care note dated 06/07/21 has history of HTN, CAD, colon cancer s/p colonoscopy, who presents to the emergency department today because of concerns for abdominal pain, nausea and vomiting.  The patient states that the symptoms started 3 days ago.  He has noticed decreased output in his colostomy bag.  There is been foul odor to his emesis.  The patient states he has had similar symptoms occasionally in the past.  He denies any fevers.   Physical Exam   Triage Vital Signs: ED Triage Vitals  Enc Vitals Group     BP 08/01/21 1726 (!) 190/88     Pulse Rate 08/01/21 1726 80     Resp 08/01/21 1726 20     Temp 08/01/21 1726 98.3 F (36.8 C)     Temp Source 08/01/21 1726 Oral     SpO2 08/01/21 1726 98 %     Weight 08/01/21 1726 167 lb (75.8 kg)     Height 08/01/21 1726 '5\' 3"'$  (1.6 m)     Head Circumference --      Peak Flow --      Pain Score 08/01/21 1734 10     Pain Loc --      Pain Edu? --      Excl. in [place]? --     Most recent vital signs: Vitals:   08/01/21 1726  BP: (!) 190/88  Pulse: 80  Resp: 20  Temp: 98.3 F (36.8 C)  SpO2: 98%    General: Awake, alert and oriented. CV:  Good peripheral perfusion. Regular rate and rhythm. Resp:  Normal effort. Lungs clear. Abd:  No distention. Diffusely tender to palpation. Colostomy bag without any output.    ED Results / Procedures / Treatments   Labs (all labs ordered are listed, but only abnormal results are displayed) Labs Reviewed  COMPREHENSIVE METABOLIC PANEL - Abnormal; Notable for the following components:      Result Value   CO2 19 (*)    Glucose, Bld 142 (*)    BUN 29 (*)    Creatinine, Ser 1.47 (*)    Total Protein 8.4 (*)    GFR, Estimated 47 (*)    All other  components within normal limits  CBG MONITORING, ED - Abnormal; Notable for the following components:   Glucose-Capillary 134 (*)    All other components within normal limits  CBC  LIPASE, BLOOD  URINALYSIS, ROUTINE W REFLEX MICROSCOPIC     EKG  None   RADIOLOGY I independently interpreted and visualized the CT abd/pel. My interpretation: No free air Radiology interpretation:  IMPRESSION:  1. Mild fluid-filled distended small bowel in the pelvis with  mucosal enhancement, but with otherwise fluid-filled nondilated  small bowel and areas of nondistended small bowel, findings favored  to represent ileus or enteritis over partial obstruction. There is  moderate fluid distension of the stomach.  2. Post distal colectomy with left abdominal colostomy. 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        PROCEDURES:  Critical Care performed: No  Procedures   MEDICATIONS ORDERED IN ED: Medications - No data to display   IMPRESSION / MDM / ASSESSMENT  AND PLAN / ED COURSE  I reviewed the triage vital signs and the nursing notes.                              Differential diagnosis includes, but is not limited to, SBO, ileus, gastroenteritis, hernia.  Patient presented to the emergency department today because of concerns for abdominal pain, nausea vomiting and decreased output in his colostomy bag.  On exam patient was tender in the abdomen.  Did have some dry heaving during my exam.  Blood work without any concerning leukocytosis.  I did obtain a CT abdomen pelvis given concern for possible obstruction.  CT was read as having likely ileus versus less likely partial small bowel obstruction.  I discussed this finding with the patient.  Do think patient would benefit from admission for IV fluids and pain management. Discussed with Dr. Posey Pronto with the hospitalist service who will plan on admission.  FINAL CLINICAL IMPRESSION(S) / ED  DIAGNOSES   Final diagnoses:  Abdominal pain, unspecified abdominal location  Nausea and vomiting, unspecified vomiting type       Note:  This document was prepared using Dragon voice recognition software and may include unintentional dictation errors.    Nance Pear, MD 08/01/21 2245

## 2021-08-02 ENCOUNTER — Encounter: Payer: Self-pay | Admitting: Internal Medicine

## 2021-08-02 DIAGNOSIS — R109 Unspecified abdominal pain: Secondary | ICD-10-CM | POA: Diagnosis not present

## 2021-08-02 LAB — TYPE AND SCREEN
ABO/RH(D): A POS
Antibody Screen: NEGATIVE

## 2021-08-02 LAB — GLUCOSE, CAPILLARY
Glucose-Capillary: 152 mg/dL — ABNORMAL HIGH (ref 70–99)
Glucose-Capillary: 230 mg/dL — ABNORMAL HIGH (ref 70–99)

## 2021-08-02 NOTE — Progress Notes (Signed)
Patient discharged to home with family . Piv removed with tip intact prior to discharge.  Discharge instructions reviewed and given to patient who verbalized understanding.

## 2021-08-02 NOTE — Discharge Summary (Signed)
Physician Discharge Summary   Willie [name]:[id number] DOB: 1940-08-06 DOA: 08/01/2021  PCP: [name], MD  Admit date: 08/01/2021 Discharge date: 08/02/2021   Admitted From: Home Disposition:  Home Discharging physician: Dwyane Dee, MD  Recommendations for Outpatient Follow-up:  Continue routine outpatient care  Home Health:  Equipment/Devices:   Discharge Condition: stable CODE STATUS: Full Diet recommendation:  Diet Orders (From admission, onward)     Start     Ordered   08/02/21 0000  Diet - low sodium heart healthy        08/02/21 1153   08/02/21 0000  Diet Carb Modified        08/02/21 1153   08/01/21 2205  Diet heart healthy/carb modified Room service appropriate? Yes; Fluid consistency: Thin  Diet effective now       Question Answer Comment  Diet-HS Snack? Nothing   Room service appropriate? Yes   Fluid consistency: Thin      08/01/21 2204            Hospital Course:  Assessment and Plan: * Abdominal pain-resolved as of 08/02/2021 Suspect due to transient ileus. - ostomy function returned prior to discharge and patient able to tolerate diet with no nausea or vomiting  Chronic kidney disease, stage 3a ([place]) - patient has history of CKD3a. 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 If vomiting rescuers low teasels  ot gen surgery.    Coronary artery disease involving native heart without angina pectoris Cont asa,statin, metoprolol.   Controlled type 2 diabetes mellitus without complication, with long-term current use of insulin ([place]) Continue patient on aspirin, statin.  Ventricular tachycardia (HCC) Continue home regimen of aspirin 81, and metoprolol.   The patient's chronic medical conditions were treated accordingly per the patient's home medication regimen except as noted.  On day of discharge, patient was felt deemed stable for discharge. Patient/family member advised to call PCP  or come back to ER if needed.   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   Chronic kidney disease, stage 3a ([place])   Discharge Instructions     Diet - low sodium heart healthy   Complete by: As directed    Diet Carb Modified   Complete by: As directed    Increase activity slowly   Complete by: As directed       Allergies as of 08/02/2021   No Known Allergies      Medication List     STOP taking these medications    dorzolamide 2 % ophthalmic solution Commonly known as: TRUSOPT   oxyCODONE 5 MG immediate release tablet Commonly known as: Oxy IR/ROXICODONE   Simbrinza 1-0.2 % Susp Generic drug: Brinzolamide-Brimonidine   tamsulosin 0.4 MG Caps capsule Commonly known as: FLOMAX       TAKE these medications    aspirin EC 81 MG tablet Take by mouth.   brimonidine 0.2 % ophthalmic solution Commonly known as: ALPHAGAN Place 1 drop into both eyes 2 (two) times daily.   lisinopril 10 MG tablet Commonly known as:  ZESTRIL Take 10 mg by mouth daily.   metoprolol succinate 50 MG 24 hr tablet Commonly known as: TOPROL-XL Take 1 tablet by mouth 1 day or 1 dose.   NovoLOG Mix 70/30 FlexPen (70-30) 100 UNIT/ML FlexPen Generic drug: insulin aspart protamine - aspart Inject 10 Units into the skin 2 (two) times daily with a meal.   pantoprazole 40 MG tablet Commonly known as: PROTONIX Take 1 tablet (40 mg total) by mouth 2 (two) times daily before a meal.   Rocklatan 0.02-0.005 % Soln Generic drug: Netarsudil-Latanoprost Place 1  drop into both eyes at bedtime.   rosuvastatin 10 MG tablet Commonly known as: CRESTOR Take 10 mg by mouth daily.   traZODone 50 MG tablet Commonly known as: DESYREL Take 50 mg by mouth at bedtime as needed for sleep.        No Known Allergies  Consultations:   Procedures:   Discharge Exam: BP (!) 134/57 (BP Location: Right Arm)   Pulse (!) 53   Temp 98.6 F (37 C) (Oral)   Resp 16   Ht '5\' 3"'  (1.6 m)   Wt 75.8 kg   SpO2 97%   BMI 29.58 kg/m  Physical Exam Constitutional:      General: He is not in acute distress.    Appearance: Normal appearance.  HENT:     Head: Normocephalic and atraumatic.     Mouth/Throat:     Mouth: Mucous membranes are moist.  Eyes:     Extraocular Movements: Extraocular movements intact.  Cardiovascular:     Rate and Rhythm: Normal rate and regular rhythm.     Heart sounds: Normal heart sounds.  Pulmonary:     Effort: Pulmonary effort is normal. No respiratory distress.     Breath sounds: Normal breath sounds. No wheezing.  Abdominal:     General: Bowel sounds are normal. There is no distension.     Palpations: Abdomen is soft.     Tenderness: There is no abdominal tenderness.     Comments: Ostomy bag noted with brown stool at stoma  Musculoskeletal:        General: Normal range of motion.     Cervical back: Normal range of motion and neck supple.  Skin:    General: Skin is warm and dry.  Neurological:     General: No focal deficit present.     Mental Status: He is alert.  Psychiatric:        Mood and Affect: Mood normal.        Behavior: Behavior normal.     The results of significant diagnostics from this hospitalization (including imaging, microbiology, ancillary and laboratory) are listed below for reference.   Microbiology: No results found for this or any previous visit (from the past 240 hour(s)).   Labs: BNP (last 3 results) No results for input(s): BNP in the last 8760 hours. Basic Metabolic Panel: Recent Labs   Lab 08/01/21 1824  NA 139  K 4.9  CL 106  CO2 19*  GLUCOSE 142*  BUN 29*  CREATININE 1.47*  CALCIUM 9.6   Liver Function Tests: Recent Labs  Lab 08/01/21 1824  AST 26  ALT 18  ALKPHOS 71  BILITOT 0.9  PROT 8.4*  ALBUMIN 4.1   Recent Labs  Lab 08/01/21 1824  LIPASE 26   No results for input(s): AMMONIA in the last 168 hours. CBC: Recent Labs  Lab 08/01/21 1735  WBC 5.1  HGB 15.2  HCT 47.5  MCV 89.5  PLT 207   Cardiac Enzymes: No results for input(s): CKTOTAL, CKMB, CKMBINDEX, TROPONINI in the last 168 hours. BNP: Invalid input(s): POCBNP CBG: Recent Labs  Lab 08/01/21 2108 08/02/21 0538 08/02/21 1146  GLUCAP 134* 152* 230*   D-Dimer No results for input(s): DDIMER in the last 72 hours. Hgb A1c No results for input(s): HGBA1C in the last 72 hours. Lipid Profile No results for input(s): CHOL, HDL, LDLCALC, TRIG, CHOLHDL, LDLDIRECT in the last 72 hours. Thyroid function studies No results for input(s): TSH, T4TOTAL, T3FREE, THYROIDAB in the last 72 hours.  Invalid input(s): FREET3 Anemia work up No results for input(s): VITAMINB12, FOLATE, FERRITIN, TIBC, IRON, RETICCTPCT in the last 72 hours. Urinalysis    Component Value Date/Time   COLORURINE STRAW (A) 07/28/2019 1527   APPEARANCEUR CLEAR (A) 07/28/2019 1527   LABSPEC 1.003 (L) 07/28/2019 1527   PHURINE 6.0 07/28/2019 1527   GLUCOSEU NEGATIVE 07/28/2019 1527   HGBUR MODERATE (A) 07/28/2019 1527   BILIRUBINUR NEGATIVE 07/28/2019 1527   KETONESUR NEGATIVE 07/28/2019 1527   PROTEINUR NEGATIVE 07/28/2019 1527   NITRITE NEGATIVE 07/28/2019 1527   LEUKOCYTESUR LARGE (A) 07/28/2019 1527   Sepsis Labs Invalid input(s): PROCALCITONIN,  WBC,  LACTICIDVEN Microbiology No results found for this or any previous visit (from the past 240 hour(s)).  Procedures/Studies: CT ABDOMEN PELVIS W CONTRAST  Result Date: 08/01/2021 CLINICAL DATA:  Abdomen pain nausea vomiting colostomy EXAM: CT ABDOMEN AND  PELVIS WITH CONTRAST TECHNIQUE: Multidetector CT imaging of the abdomen and pelvis was performed using the standard protocol following bolus administration of intravenous contrast. RADIATION DOSE REDUCTION: This exam was performed according to the departmental dose-optimization program which includes automated exposure control, adjustment of the mA and/or kV according to patient size and/or use of iterative reconstruction technique. CONTRAST:  129m OMNIPAQUE IOHEXOL 300 MG/ML  SOLN COMPARISON:  CT 05/17/2021, 11/15/2020 FINDINGS: Lower chest: Lung bases demonstrate no acute airspace disease. Hepatobiliary: No focal liver abnormality is seen. No gallstones, gallbladder wall thickening, or biliary dilatation. Pancreas: Unremarkable. No pancreatic ductal dilatation or surrounding inflammatory changes. Spleen: Normal in size without focal abnormality. Adrenals/Urinary Tract: Adrenal glands are normal. Kidneys show no hydronephrosis. Cyst lower pole right kidney, no follow-up imaging is recommended. Urinary bladder is unremarkable Stomach/Bowel: Small distal esophageal hernia. Moderate fluid distension of the stomach. Slightly thickened appearance pylorus but with fluid within the canal on delayed views. Multiple fluid-filled loops of small bowel with areas of decompressed small bowel. Slightly dilated fluid-filled small bowel in the pelvis measuring up to 3.4 cm with mild wall enhancement. Negative appendix. Status post distal colectomy with left abdominal colostomy. Small rectal pouch in the pelvis. Vascular/Lymphatic: Mild aortic atherosclerosis. No aneurysm. No suspicious lymph node Reproductive: Prostatectomy Other: Minimal hazy soft tissue density in the presacral area without change. No free air or free fluid. Musculoskeletal: No acute osseous abnormality. IMPRESSION: 1. Mild fluid-filled distended small bowel in the pelvis with mucosal enhancement, but with otherwise fluid-filled nondilated small bowel and areas  of nondistended small bowel, findings favored to represent ileus or enteritis over partial obstruction. There is moderate fluid distension of the stomach. 2. Post distal colectomy with left abdominal colostomy. 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 Electronically Signed   By: KDonavan FoilM.D.   On: 08/01/2021 20:44     Time coordinating discharge: Over 30 minutes    DDwyane Dee MD  [facility] 08/02/2021, 2:44 PM

## 2021-08-02 NOTE — Evaluation (Signed)
Physical Therapy Evaluation Patient Details Name: Willie [name] MRN: [id number] DOB: May 01, 1940 Today's Date: 08/02/2021  History of Present Illness  Pt is an 53 male that presented to ED for abdominal pain, intractable vomiting. PMH of colon cancer, colectomy, anemia, CAD, DMII.   Clinical Impression  Patient A&Ox4, denied any pain, reported at baseline he is independent (does not drive) and that his nephew lives with him, denied any falls in the last year as well. Independent for mobility.  The patient demonstrated bed mobility, transfers, and ambulation independently. Able to complete without physical assist and with AD. He ambulated ~231f, no LOB noted, able to converse with PT throughout. The patient demonstrated and reported return to baseline level of functioning, no further acute PT needs indicated. PT to sign off. Please reconsult PT if pt status changes or acute needs are identified.         Recommendations for follow up therapy are one component of a multi-disciplinary discharge planning process, led by the attending physician.  Recommendations may be updated based on patient status, additional functional criteria and insurance authorization.  Follow Up Recommendations No PT follow up    Assistance Recommended at Discharge PRN  Patient can return home with the following  Assist for transportation    Equipment Recommendations None recommended by PT  Recommendations for Other Services       Functional Status Assessment Patient has not had a recent decline in their functional status     Precautions / Restrictions Precautions Precautions: Fall Restrictions Weight Bearing Restrictions: No      Mobility  Bed Mobility Overal bed mobility: Independent             General bed mobility comments: sitting EOB at start/end of session    Transfers Overall transfer level: Independent Equipment used: None                    Ambulation/Gait Ambulation/Gait  assistance: Independent Gait Distance (Feet): 200 Feet Assistive device: None Gait Pattern/deviations: WFL(Within Functional Limits)          Stairs            Wheelchair Mobility    Modified Rankin (Stroke Patients Only)       Balance Overall balance assessment: No apparent balance deficits (not formally assessed)                                           Pertinent Vitals/Pain Pain Assessment Pain Assessment: No/denies pain    Home Living Family/patient expects to be discharged to:: Private residence Living Arrangements: Other relatives Available Help at Discharge: Family Type of Home: House Home Access: Stairs to enter Entrance Stairs-Rails: RPsychiatric nurseof Steps: 2 Alternate Level Stairs-Number of Steps: 17 Home Layout: Two level;Able to live on main level with bedroom/bathroom Home Equipment: Rolling Walker (2 wheels);Cane - single point      Prior Function               Mobility Comments: independent, does not drive any more. cooks breakfast, does some cleaning       Hand Dominance        Extremity/Trunk Assessment   Upper Extremity Assessment Upper Extremity Assessment: Overall WFL for tasks assessed    Lower Extremity Assessment Lower Extremity Assessment: Overall WFL for tasks assessed    Cervical / Trunk Assessment Cervical / Trunk Assessment:  Normal  Communication   Communication: No difficulties  Cognition Arousal/Alertness: Awake/alert Behavior During Therapy: WFL for tasks assessed/performed Overall Cognitive Status: Within Functional Limits for tasks assessed                                          General Comments      Exercises     Assessment/Plan    PT Assessment Patient does not need any further PT services  PT Problem List         PT Treatment Interventions      PT Goals (Current goals can be found in the Care Plan section)       Frequency        Co-evaluation               AM-PAC PT "6 Clicks" Mobility  Outcome Measure Help needed turning from your back to your side while in a flat bed without using bedrails?: None Help needed moving from lying on your back to sitting on the side of a flat bed without using bedrails?: None Help needed moving to and from a bed to a chair (including a wheelchair)?: None Help needed standing up from a chair using your arms (e.g., wheelchair or bedside chair)?: None Help needed to walk in hospital room?: None Help needed climbing 3-5 steps with a railing? : None 6 Click Score: 24    End of Session   Activity Tolerance: Patient tolerated treatment well Patient left: in bed;with call bell/phone within reach;with family/visitor present;Other (comment) (sitting EOB at end of session with family at bedside) Nurse Communication: Mobility status PT Visit Diagnosis: Other abnormalities of gait and mobility (R26.89)    Time: 0938-1829 PT Time Calculation (min) (ACUTE ONLY): 11 min   Charges:   PT Evaluation $PT Eval Low Complexity: 1 Low          Lieutenant Diego PT, DPT 9:57 AM,08/02/21

## 2021-08-02 NOTE — Plan of Care (Signed)
  RD consulted for nutritional goals assessment.   Pt states that has been eating well during admission and has had no change to his appetite or PO intake recently. He typically gets up late morning and has his first meal around 12P which includes eggs, cereal with a banana and a grilled chicken biscuit from [employer]. His next meal is later in the evening and consists of a variety fish or meatloaf, vegetables, pinto beans, green beans, broccoli and pasta. He does not often snack throughout the day but has chocolate covered pecans at night sometimes. Pt occasionally drinks diet sodas and diet crangrape juice. He endorses drinking Boost supplements previously but has not had any recently. Pt manages his diabetes with Humalog and blood sugar checks via [employer] and endorses having good blood sugar control averaging 125. He denies recent significant weight loss. Pt was requesting additional information on how to best manage his diabetes.   Lab Results  Component Value Date   HGBA1C 8.0 (H) 10/10/2018    RD provided "Plate Method" handout from the Academy of [employer] Dietetics. Discussed different food groups and their effects on blood sugar, emphasizing carbohydrate-containing foods. Provided list of carbohydrates and recommended serving sizes of common foods.  Discussed importance of controlled and consistent carbohydrate intake throughout the day. Provided examples of ways to balance meals/snacks and encouraged intake of high-fiber, whole grain complex carbohydrates. Teach back method used.  Expect good compliance.  Body mass index is 29.58 kg/m. Pt meets criteria for overweight based on current BMI.  Current diet order is heart healthy/carb modified, patient is consuming approximately 100% of meals at this time. Labs and medications reviewed. No further nutrition interventions warranted at this time. RD contact information provided. If additional nutrition issues arise, please  re-consult RD.  Clayborne Dana, RDN, LDN Clinical Nutrition

## 2021-08-02 NOTE — TOC Initial Note (Signed)
Transition of Care [facility]) - Initial/Assessment Note    Patient Details  Name: Willie [name] MRN: [id number] Date of Birth: Dec 25, 1940  Transition of Care [facility]) CM/SW Contact:    Beverly Sessions, RN Phone Number: 08/02/2021, 10:22 AM  Clinical Narrative:                  [facility] consult for home health/DME Per MD no PT follow up or dme needs   Transition of Care [facility]) Screening Note   Patient Details  Name: Willie [name] Date of Birth: 1941-02-21   Transition of Care [facility]) CM/SW Contact:    Beverly Sessions, RN Phone Number: 08/02/2021, 10:22 AM    Transition of Care Department (TOC) has reviewed patient and no TOC needs have been identified at this time. We will continue to monitor patient advancement through interdisciplinary progression rounds. If new patient transition needs arise, please place a TOC consult.         Patient Goals and CMS Choice        Expected Discharge Plan and Services                                                Prior Living Arrangements/Services                       Activities of Daily Living Home Assistive Devices/Equipment: Eyeglasses, CBG Meter ADL Screening (condition at time of admission) Patient's cognitive ability adequate to safely complete daily activities?: Yes Is the patient deaf or have difficulty hearing?: No Does the patient have difficulty seeing, even when wearing glasses/contacts?: No Does the patient have difficulty concentrating, remembering, or making decisions?: No Patient able to express need for assistance with ADLs?: Yes Does the patient have difficulty dressing or bathing?: No Independently performs ADLs?: Yes (appropriate for developmental age) Does the patient have difficulty walking or climbing stairs?: No Weakness of Legs: None Weakness of Arms/Hands: None  Permission Sought/Granted                  Emotional Assessment              Admission diagnosis:   Abdominal pain [R10.9] Abdominal pain, unspecified abdominal location [R10.9] Nausea and vomiting, unspecified vomiting type [R11.2] Patient Active Problem List   Diagnosis Date Noted   Abdominal pain 08/01/2021   Acute kidney injury superimposed on chronic kidney disease ([place]) 08/01/2021   Red blood cell antibody positive, 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 05/20/2019   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[place]) 05/13/2019   Chemotherapy induced neutropenia ([place]) 03/11/2019   Hypokalemia 10/27/2018   Protein-calorie malnutrition, severe 10/12/2018   Sepsis ([place]) 10/10/2018   Hypercholesteremia 08/17/2018   Goals of care, counseling/discussion 08/05/2018   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[place]) 08/05/2018   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   History of gastric ulcer 07/07/2018   Ventricular tachycardia ([place]) 10/31/2017   Benign prostatic hyperplasia 09/16/2017   Controlled type 2 diabetes mellitus without complication, with long-term current use of insulin ([place]) 09/16/2017   Coronary artery disease involving native heart without angina pectoris 09/16/2017   Essential hypertension 09/16/2017   Onychomycosis due to dermatophyte 05/28/2013   PCP:  [name], MD Pharmacy:   [facility], [place] [address] Phone:  [phone] Fax: [phone]     Social Determinants of Health (SDOH) Interventions    Readmission Risk Interventions     View : No data to display.

## 2021-08-02 NOTE — Discharge Instructions (Signed)

## 2021-09-19 ENCOUNTER — Emergency Department
Admission: EM | Admit: 2021-09-19 | Discharge: 2021-09-19 | Disposition: A | Discharge status: Home / Self Care | Payer: Medicare PPO | Attending: Emergency Medicine | Admitting: Emergency Medicine

## 2021-09-19 ENCOUNTER — Other Ambulatory Visit: Payer: Self-pay

## 2021-09-19 ENCOUNTER — Emergency Department: Payer: Medicare PPO

## 2021-09-19 DIAGNOSIS — I2510 Atherosclerotic heart disease of native coronary artery without angina pectoris: Secondary | ICD-10-CM | POA: Diagnosis not present

## 2021-09-19 DIAGNOSIS — E119 Type 2 diabetes mellitus without complications: Secondary | ICD-10-CM | POA: Insufficient documentation

## 2021-09-19 DIAGNOSIS — I10 Essential (primary) hypertension: Secondary | ICD-10-CM | POA: Insufficient documentation

## 2021-09-19 DIAGNOSIS — M25512 Pain in left shoulder: Secondary | ICD-10-CM | POA: Insufficient documentation

## 2021-09-19 LAB — BASIC METABOLIC PANEL
Anion gap: 7 (ref 5–15)
BUN: 21 mg/dL (ref 8–23)
CO2: 23 mmol/L (ref 22–32)
Calcium: 8.9 mg/dL (ref 8.9–10.3)
Chloride: 107 mmol/L (ref 98–111)
Creatinine, Ser: 1.37 mg/dL — ABNORMAL HIGH (ref 0.61–1.24)
GFR, Estimated: 51 mL/min — ABNORMAL LOW (ref >60)
Glucose, Bld: 182 mg/dL — ABNORMAL HIGH (ref 70–99)
Potassium: 3.8 mmol/L (ref 3.5–5.1)
Sodium: 137 mmol/L (ref 135–145)

## 2021-09-19 LAB — CBC
HCT: 40.4 % (ref 39.0–52.0)
Hemoglobin: 13.5 g/dL (ref 13.0–17.0)
MCH: 29.5 pg (ref 26.0–34.0)
MCHC: 33.4 g/dL (ref 30.0–36.0)
MCV: 88.4 fL (ref 80.0–100.0)
Platelets: 181 10*3/uL (ref 150–400)
RBC: 4.57 MIL/uL (ref 4.22–5.81)
RDW: 14.1 % (ref 11.5–15.5)
WBC: 4.2 10*3/uL (ref 4.0–10.5)
nRBC: 0 % (ref 0.0–0.2)

## 2021-09-19 LAB — TROPONIN I (HIGH SENSITIVITY): Troponin I (High Sensitivity): 5 ng/L (ref <18)

## 2021-09-19 MED ORDER — TRAMADOL HCL 50 MG PO TABS: 50.0000 mg | ORAL_TABLET | Freq: Four times a day (QID) | ORAL | 0 refills | Status: DC | PRN

## 2021-09-19 NOTE — ED Provider Notes (Signed)
[facility] Provider Note    Event Date/Time   First MD Initiated Contact with Patient 09/19/21 1005     (approximate)  History   Chief Complaint: Chest Pain  HPI  SHAFTER JUPIN is a 81 y.o. male with a past medical history of anemia, CAD, diabetes, gastric reflux, hypertension, hyperlipidemia, presents to the emergency department for left shoulder pain.  According to the patient over the past week or so he has been experiencing pain in the left shoulder.  He states the pain is worse when he moves the left shoulder especially if he tries to pull himself up with his left arm.  Patient denies any pain in the chest denies any shortness of breath.  No nausea or diaphoresis.  Patient states he sleeps on the left side of his body and states he believes that is what caused his left shoulder pain, but wanted to be safe so he came to the emergency department.  Physical Exam   Triage Vital Signs: ED Triage Vitals  Enc Vitals Group     BP 09/19/21 0832 (!) 148/62     Pulse Rate 09/19/21 0832 80     Resp 09/19/21 0832 16     Temp 09/19/21 0849 98.9 F (37.2 C)     Temp Source 09/19/21 0832 Oral     SpO2 09/19/21 0832 97 %     Weight 09/19/21 0832 172 lb (78 kg)     Height 09/19/21 0832 '5\' 3"'$  (1.6 m)     Head Circumference --      Peak Flow --      Pain Score 09/19/21 0832 5     Pain Loc --      Pain Edu? --      Excl. in [place]? --     Most recent vital signs: Vitals:   09/19/21 0832 09/19/21 0849  BP: (!) 148/62   Pulse: 80   Resp: 16   Temp:  98.9 F (37.2 C)  SpO2: 97%     General: Awake, no distress.  CV:  Good peripheral perfusion.  Regular rate and rhythm  Resp:  Normal effort.  Equal breath sounds bilaterally.  Abd:  No distention.  Soft, nontender.  No rebound or guarding. Other:  Pain with range of motion of the left shoulder.  Mild pain with tenderness to palpation.   ED Results / Procedures / Treatments   EKG  EKG viewed and  interpreted by myself shows a normal sinus rhythm at 80 bpm with a narrow QRS, normal axis, normal intervals, no concerning ST changes.  Patient does have occasional PVCs.  RADIOLOGY  I have viewed and interpreted the patient's chest x-ray I do not see any acute abnormality seen on my evaluation. Radiology is read the chest x-ray is negative   MEDICATIONS ORDERED IN ED: Medications - No data to display   IMPRESSION / MDM / [place] / ED COURSE  I reviewed the triage vital signs and the nursing notes.  Patient's presentation is most consistent with acute presentation with potential threat to life or bodily function.  Patient presents to the emergency department for left shoulder pain.  Patient was concerned given the left shoulder and wanted to make sure that his heart and lungs were okay.  Patient's work-up is reassuring.  Tender to palpation of the left shoulder more so with active range of motion and passive possibly indicating rotator cuff injury or tendinitis.  Reassuringly patient's work-up is normal  including a normal CBC, reassuring chemistry and a negative troponin.  EKG shows occasional PVCs but no other acute abnormalities.  Chest x-ray is clear.  Given the patient's reassuring work-up symptoms x1 week with a negative troponin and reproducible left shoulder pain highly suspect musculoskeletal pain.  We will discharge with a very short course of tramadol have the patient follow-up with orthopedics.  Patient agreeable to plan of care.  FINAL CLINICAL IMPRESSION(S) / ED DIAGNOSES   Left shoulder pain  Rx / DC Orders   Femoral Orthopedic follow-up  Note:  This document was prepared using Dragon voice recognition software and may include unintentional dictation errors.   Harvest Dark, MD 09/19/21 1017

## 2021-09-19 NOTE — ED Triage Notes (Signed)
Pt c/o left sided chest pain with intermittent dizziness for the past 3 days, pt is in NAD on arrival, ambulatory with a steady gait.

## 2021-09-23 ENCOUNTER — Emergency Department: Payer: Medicare PPO

## 2021-09-23 ENCOUNTER — Other Ambulatory Visit: Payer: Self-pay

## 2021-09-23 ENCOUNTER — Encounter: Payer: Self-pay | Admitting: Internal Medicine

## 2021-09-23 ENCOUNTER — Inpatient Hospital Stay
Admission: EM | Admit: 2021-09-23 | Discharge: 2021-09-27 | DRG: 389 | Disposition: A | Discharge status: Home / Self Care | Payer: Medicare PPO | Attending: Internal Medicine | Admitting: Internal Medicine

## 2021-09-23 ENCOUNTER — Inpatient Hospital Stay: Payer: Medicare PPO

## 2021-09-23 DIAGNOSIS — Z9079 Acquired absence of other genital organ(s): Secondary | ICD-10-CM

## 2021-09-23 DIAGNOSIS — R112 Nausea with vomiting, unspecified: Secondary | ICD-10-CM

## 2021-09-23 DIAGNOSIS — Z79899 Other long term (current) drug therapy: Secondary | ICD-10-CM

## 2021-09-23 DIAGNOSIS — Z7982 Long term (current) use of aspirin: Secondary | ICD-10-CM

## 2021-09-23 DIAGNOSIS — Z8546 Personal history of malignant neoplasm of prostate: Secondary | ICD-10-CM | POA: Diagnosis not present

## 2021-09-23 DIAGNOSIS — I2510 Atherosclerotic heart disease of native coronary artery without angina pectoris: Secondary | ICD-10-CM | POA: Diagnosis present

## 2021-09-23 DIAGNOSIS — Z9221 Personal history of antineoplastic chemotherapy: Secondary | ICD-10-CM

## 2021-09-23 DIAGNOSIS — E7800 Pure hypercholesterolemia, unspecified: Secondary | ICD-10-CM | POA: Diagnosis present

## 2021-09-23 DIAGNOSIS — Z794 Long term (current) use of insulin: Secondary | ICD-10-CM

## 2021-09-23 DIAGNOSIS — I129 Hypertensive chronic kidney disease with stage 1 through stage 4 chronic kidney disease, or unspecified chronic kidney disease: Secondary | ICD-10-CM | POA: Diagnosis present

## 2021-09-23 DIAGNOSIS — Z85048 Personal history of other malignant neoplasm of rectum, rectosigmoid junction, and anus: Secondary | ICD-10-CM

## 2021-09-23 DIAGNOSIS — Z955 Presence of coronary angioplasty implant and graft: Secondary | ICD-10-CM | POA: Diagnosis not present

## 2021-09-23 DIAGNOSIS — Z9049 Acquired absence of other specified parts of digestive tract: Secondary | ICD-10-CM | POA: Diagnosis not present

## 2021-09-23 DIAGNOSIS — I4729 Other ventricular tachycardia: Secondary | ICD-10-CM | POA: Diagnosis present

## 2021-09-23 DIAGNOSIS — Z8711 Personal history of peptic ulcer disease: Secondary | ICD-10-CM

## 2021-09-23 DIAGNOSIS — K5651 Intestinal adhesions [bands], with partial obstruction: Secondary | ICD-10-CM | POA: Diagnosis present

## 2021-09-23 DIAGNOSIS — N1831 Chronic kidney disease, stage 3a: Secondary | ICD-10-CM | POA: Diagnosis present

## 2021-09-23 DIAGNOSIS — K219 Gastro-esophageal reflux disease without esophagitis: Secondary | ICD-10-CM | POA: Diagnosis present

## 2021-09-23 DIAGNOSIS — C20 Malignant neoplasm of rectum: Secondary | ICD-10-CM | POA: Diagnosis present

## 2021-09-23 DIAGNOSIS — R1084 Generalized abdominal pain: Principal | ICD-10-CM

## 2021-09-23 DIAGNOSIS — Z972 Presence of dental prosthetic device (complete) (partial): Secondary | ICD-10-CM

## 2021-09-23 DIAGNOSIS — I472 Ventricular tachycardia, unspecified: Secondary | ICD-10-CM | POA: Diagnosis present

## 2021-09-23 DIAGNOSIS — E1122 Type 2 diabetes mellitus with diabetic chronic kidney disease: Secondary | ICD-10-CM | POA: Diagnosis present

## 2021-09-23 DIAGNOSIS — E860 Dehydration: Secondary | ICD-10-CM | POA: Diagnosis present

## 2021-09-23 DIAGNOSIS — K56600 Partial intestinal obstruction, unspecified as to cause: Secondary | ICD-10-CM | POA: Diagnosis present

## 2021-09-23 DIAGNOSIS — K56609 Unspecified intestinal obstruction, unspecified as to partial versus complete obstruction: Secondary | ICD-10-CM | POA: Diagnosis present

## 2021-09-23 DIAGNOSIS — Z933 Colostomy status: Secondary | ICD-10-CM

## 2021-09-23 DIAGNOSIS — Z833 Family history of diabetes mellitus: Secondary | ICD-10-CM

## 2021-09-23 DIAGNOSIS — N179 Acute kidney failure, unspecified: Secondary | ICD-10-CM | POA: Diagnosis present

## 2021-09-23 DIAGNOSIS — E1129 Type 2 diabetes mellitus with other diabetic kidney complication: Secondary | ICD-10-CM | POA: Diagnosis present

## 2021-09-23 DIAGNOSIS — I10 Essential (primary) hypertension: Secondary | ICD-10-CM | POA: Diagnosis present

## 2021-09-23 DIAGNOSIS — Z923 Personal history of irradiation: Secondary | ICD-10-CM | POA: Diagnosis not present

## 2021-09-23 DIAGNOSIS — Z66 Do not resuscitate: Secondary | ICD-10-CM | POA: Diagnosis present

## 2021-09-23 LAB — GLUCOSE, CAPILLARY
Glucose-Capillary: 163 mg/dL — ABNORMAL HIGH (ref 70–99)
Glucose-Capillary: 160 mg/dL — ABNORMAL HIGH (ref 70–99)

## 2021-09-23 LAB — URINALYSIS, ROUTINE W REFLEX MICROSCOPIC
Bilirubin Urine: NEGATIVE
Glucose, UA: NEGATIVE mg/dL
Ketones, ur: 20 mg/dL — AB
Leukocytes,Ua: NEGATIVE
Nitrite: NEGATIVE
Protein, ur: 100 mg/dL — AB
Specific Gravity, Urine: 1.02 (ref 1.005–1.030)
pH: 5 (ref 5.0–8.0)

## 2021-09-23 LAB — CBC WITH DIFFERENTIAL/PLATELET
Abs Immature Granulocytes: 0.01 10*3/uL (ref 0.00–0.07)
Basophils Absolute: 0 10*3/uL (ref 0.0–0.1)
Basophils Relative: 0 %
Eosinophils Absolute: 0 10*3/uL (ref 0.0–0.5)
Eosinophils Relative: 0 %
HCT: 46.5 % (ref 39.0–52.0)
Hemoglobin: 15.5 g/dL (ref 13.0–17.0)
Immature Granulocytes: 0 %
Lymphocytes Relative: 29 %
Lymphs Abs: 1.7 10*3/uL (ref 0.7–4.0)
MCH: 28.9 pg (ref 26.0–34.0)
MCHC: 33.3 g/dL (ref 30.0–36.0)
MCV: 86.8 fL (ref 80.0–100.0)
Monocytes Absolute: 0.6 10*3/uL (ref 0.1–1.0)
Monocytes Relative: 9 %
Neutro Abs: 3.6 10*3/uL (ref 1.7–7.7)
Neutrophils Relative %: 62 %
Platelets: 216 10*3/uL (ref 150–400)
RBC: 5.36 MIL/uL (ref 4.22–5.81)
RDW: 13.9 % (ref 11.5–15.5)
WBC: 5.8 10*3/uL (ref 4.0–10.5)
nRBC: 0 % (ref 0.0–0.2)

## 2021-09-23 LAB — COMPREHENSIVE METABOLIC PANEL
ALT: 19 U/L (ref 0–44)
AST: 21 U/L (ref 15–41)
Albumin: 4.1 g/dL (ref 3.5–5.0)
Alkaline Phosphatase: 81 U/L (ref 38–126)
Anion gap: 11 (ref 5–15)
BUN: 26 mg/dL — ABNORMAL HIGH (ref 8–23)
CO2: 21 mmol/L — ABNORMAL LOW (ref 22–32)
Calcium: 9.6 mg/dL (ref 8.9–10.3)
Chloride: 103 mmol/L (ref 98–111)
Creatinine, Ser: 1.45 mg/dL — ABNORMAL HIGH (ref 0.61–1.24)
GFR, Estimated: 48 mL/min — ABNORMAL LOW (ref >60)
Glucose, Bld: 208 mg/dL — ABNORMAL HIGH (ref 70–99)
Potassium: 4.1 mmol/L (ref 3.5–5.1)
Sodium: 135 mmol/L (ref 135–145)
Total Bilirubin: 0.8 mg/dL (ref 0.3–1.2)
Total Protein: 8.3 g/dL — ABNORMAL HIGH (ref 6.5–8.1)

## 2021-09-23 LAB — APTT: aPTT: 25 seconds (ref 24–36)

## 2021-09-23 LAB — CBG MONITORING, ED
Glucose-Capillary: 174 mg/dL — ABNORMAL HIGH (ref 70–99)
Glucose-Capillary: 175 mg/dL — ABNORMAL HIGH (ref 70–99)

## 2021-09-23 LAB — PROTIME-INR
INR: 1.1 (ref 0.8–1.2)
Prothrombin Time: 13.7 seconds (ref 11.4–15.2)

## 2021-09-23 LAB — TROPONIN I (HIGH SENSITIVITY)
Troponin I (High Sensitivity): 12 ng/L (ref <18)
Troponin I (High Sensitivity): 15 ng/L (ref <18)

## 2021-09-23 LAB — LIPASE, BLOOD: Lipase: 21 U/L (ref 11–51)

## 2021-09-23 MED ORDER — INSULIN ASPART 100 UNIT/ML IJ SOLN
0.0000 [IU] | Freq: Three times a day (TID) | INTRAMUSCULAR | Status: DC
  Administered 2021-09-23 – 2021-09-24 (×3): 1 [IU] via SUBCUTANEOUS
  Administered 2021-09-24: 2 [IU] via SUBCUTANEOUS
  Administered 2021-09-24 – 2021-09-26 (×3): 1 [IU] via SUBCUTANEOUS
  Administered 2021-09-26: 2 [IU] via SUBCUTANEOUS
  Administered 2021-09-27: 1 [IU] via SUBCUTANEOUS
  Administered 2021-09-27: 3 [IU] via SUBCUTANEOUS
  Filled 2021-09-23 (×10): qty 1

## 2021-09-23 MED ORDER — ACETAMINOPHEN 325 MG PO TABS: 650.0000 mg | ORAL_TABLET | Freq: Four times a day (QID) | ORAL | Status: DC | PRN

## 2021-09-23 MED ORDER — ONDANSETRON HCL 4 MG/2ML IJ SOLN
4.0000 mg | Freq: Three times a day (TID) | INTRAMUSCULAR | Status: DC | PRN
  Administered 2021-09-23: 4 mg via INTRAVENOUS
  Filled 2021-09-23: qty 2

## 2021-09-23 MED ORDER — MORPHINE SULFATE (PF) 4 MG/ML IV SOLN: 4.0000 mg | Freq: Once | INTRAVENOUS | Status: DC

## 2021-09-23 MED ORDER — ONDANSETRON HCL 4 MG/2ML IJ SOLN
4.0000 mg | Freq: Once | INTRAMUSCULAR | Status: AC
  Administered 2021-09-23: 4 mg via INTRAVENOUS
  Filled 2021-09-23: qty 2

## 2021-09-23 MED ORDER — PANTOPRAZOLE SODIUM 40 MG IV SOLR
40.0000 mg | INTRAVENOUS | Status: DC
  Administered 2021-09-23 – 2021-09-24 (×2): 40 mg via INTRAVENOUS
  Filled 2021-09-23 (×2): qty 10

## 2021-09-23 MED ORDER — MORPHINE SULFATE (PF) 2 MG/ML IV SOLN
1.0000 mg | INTRAVENOUS | Status: DC | PRN
  Administered 2021-09-23 (×3): 1 mg via INTRAVENOUS
  Filled 2021-09-23 (×3): qty 1

## 2021-09-23 MED ORDER — SODIUM CHLORIDE 0.9 % IV SOLN: INTRAVENOUS | Status: DC

## 2021-09-23 MED ORDER — BRIMONIDINE TARTRATE 0.2 % OP SOLN
1.0000 [drp] | Freq: Two times a day (BID) | OPHTHALMIC | Status: DC
  Administered 2021-09-24 – 2021-09-27 (×6): 1 [drp] via OPHTHALMIC

## 2021-09-23 MED ORDER — ACETAMINOPHEN 650 MG RE SUPP: 650.0000 mg | Freq: Four times a day (QID) | RECTAL | Status: DC | PRN

## 2021-09-23 MED ORDER — INSULIN ASPART 100 UNIT/ML IJ SOLN: 0.0000 [IU] | Freq: Every day | INTRAMUSCULAR | Status: DC

## 2021-09-23 MED ORDER — SODIUM CHLORIDE 0.9 % IV BOLUS
1000.0000 mL | Freq: Once | INTRAVENOUS | Status: AC
  Administered 2021-09-23: 1000 mL via INTRAVENOUS

## 2021-09-23 MED ORDER — FENTANYL CITRATE PF 50 MCG/ML IJ SOSY
25.0000 ug | PREFILLED_SYRINGE | Freq: Once | INTRAMUSCULAR | Status: AC
  Administered 2021-09-23: 25 ug via INTRAVENOUS
  Filled 2021-09-23: qty 1

## 2021-09-23 MED ORDER — NETARSUDIL-LATANOPROST 0.02-0.005 % OP SOLN
1.0000 [drp] | Freq: Every day | OPHTHALMIC | Status: DC
Start: 2021-09-23 — End: 2021-09-27
  Administered 2021-09-25 – 2021-09-26 (×3): 1 [drp] via OPHTHALMIC
  Filled 2021-09-23: qty 1

## 2021-09-23 MED ORDER — INSULIN ASPART PROT & ASPART (70-30 MIX) 100 UNIT/ML ~~LOC~~ SUSP
6.0000 [IU] | Freq: Two times a day (BID) | SUBCUTANEOUS | Status: DC
  Administered 2021-09-25 – 2021-09-27 (×4): 6 [IU] via SUBCUTANEOUS
  Filled 2021-09-23: qty 10

## 2021-09-23 MED ORDER — HYDRALAZINE HCL 20 MG/ML IJ SOLN
5.0000 mg | INTRAMUSCULAR | Status: DC | PRN
Start: 2021-09-23 — End: 2021-09-27
  Administered 2021-09-23 – 2021-09-24 (×2): 5 mg via INTRAVENOUS
  Filled 2021-09-23 (×2): qty 1

## 2021-09-23 MED ORDER — IOHEXOL 300 MG/ML  SOLN
100.0000 mL | Freq: Once | INTRAMUSCULAR | Status: AC | PRN
  Administered 2021-09-23: 100 mL via INTRAVENOUS

## 2021-09-23 MED ORDER — MORPHINE SULFATE (PF) 4 MG/ML IV SOLN
4.0000 mg | Freq: Once | INTRAVENOUS | Status: AC
  Administered 2021-09-23: 4 mg via INTRAVENOUS
  Filled 2021-09-23: qty 1

## 2021-09-23 NOTE — ED Notes (Signed)
General Surgery at bedside at this time.

## 2021-09-23 NOTE — Assessment & Plan Note (Addendum)
-  IV hydralazine as needed -Oral antihypertensives held due to n.p.o. status -Metoprolol resumed 7/17 due to tachycardia and trigeminy

## 2021-09-23 NOTE — Assessment & Plan Note (Addendum)
Resume statin

## 2021-09-23 NOTE — H&P (Addendum)
History and Physical    Willie [name] BTD:[id number] DOB: 1941/02/20 DOA: 09/23/2021  Referring MD/NP/PA:   PCP: [name], MD   Patient coming from:  The patient is coming from home.  At baseline, pt is independent for most of ADL.        Chief Complaint: Nausea, vomiting, abdominal pain  HPI: Willie [name] is a 81 y.o. male with medical history significant of rectal cancer (s/p for colectomy, colostomy, chemotherapy and radiation therapy), hypertension, hyperlipidemia, diabetes mellitus, gastric ulcer, CAD, V. tach, small bowel obstruction, who presents with nausea, vomiting, abdominal pain.    Patient states that he has nausea, vomiting, abdominal pain in the past 2 days.  He has vomited several times with nonbilious nonbloody vomiting.  His abdominal pain is located in the central abdomen, constant, mild, aching, nonradiating.  The last bowel movement was 2 days ago.  Denies fever or chills.  Patient does not have chest pain, cough, shortness breath.  No symptoms of UTI.  Data reviewed independently and ED Course: pt was found to have WBC 5.8, troponin level 12, 15, negative urinalysis, renal function close to baseline, temperature 99.1, blood pressure 179/85, heart rate 116, RR 24, oxygen saturation 97% on room air.  Chest x-ray negative.  CT abdomen/pelvis that showed low-grade small bowel obstruction.  Patient is admitted to telemetry bed as inpatient.  Dr. Christian Mate of surgery is consulted.   EKG: I have personally reviewed.  Sinus rhythm, QTc 471, LAE, ventricular trigeminy, early R wave progression.   Review of Systems:   General: no fevers, chills, no body weight gain, has fatigue HEENT: no blurry vision, hearing changes or sore throat Respiratory: no dyspnea, coughing, wheezing CV: no chest pain, no palpitations GI: has nausea, vomiting, abdominal pain, no diarrhea GU: no dysuria, burning on urination, increased urinary frequency, hematuria  Ext: no leg  edema Neuro: no unilateral weakness, numbness, or tingling, no vision change or hearing loss Skin: no rash, no skin tear. MSK: No muscle spasm, no deformity, no limitation of range of movement in spin Heme: No easy bruising.  Travel history: No recent long distant travel.   Allergy: No Known Allergies  Past Medical History:  Diagnosis Date   Anemia    Coronary artery disease    Diabetes mellitus without complication (HCC)    GERD (gastroesophageal reflux disease)    History of hiatal hernia    Hypercholesterolemia    Hypertension    Prostate cancer ([place]) 2000   Prostatectomy.    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[place]) 09/2018   Chemo tx's.    Wears dentures    partial lower    Past Surgical History:  Procedure Laterality Date   CATARACT EXTRACTION W/PHACO Right 10/12/2019   Procedure: CATARACT EXTRACTION PHACO AND INTRAOCULAR LENS PLACEMENT (IOC) RIGHT DIABETIC 6.35 00:37.1;  Surgeon: Birder Robson, MD;  Location: [facility];  Service: Ophthalmology;  Laterality: Right;   COLONOSCOPY N/A 05/02/2021   Procedure: COLONOSCOPY;  Surgeon: [name], [name], MD;  Location: [facility];  Service: Gastroenterology;  Laterality: N/A;  IDDM   COLONOSCOPY WITH PROPOFOL N/A 07/15/2018   Procedure: COLONOSCOPY WITH PROPOFOL;  Surgeon: [name], [name], MD;  Location: [facility];  Service: Gastroenterology;  Laterality: N/A;   ESOPHAGOGASTRODUODENOSCOPY N/A 10/12/2018   Procedure: ESOPHAGOGASTRODUODENOSCOPY (EGD);  Surgeon: [name], MD;  Location: [facility];  Service: Gastroenterology;  Laterality: N/A;   ESOPHAGOGASTRODUODENOSCOPY (EGD) WITH PROPOFOL N/A 07/15/2018   Procedure: ESOPHAGOGASTRODUODENOSCOPY (EGD) WITH PROPOFOL;  Surgeon: [place], [place]  K, MD;  Location: [facility] ENDOSCOPY;  Service: Gastroenterology;  Laterality: N/A;   PORTACATH PLACEMENT N/A 08/13/2018   Procedure: INSERTION PORT-A-CATH;  Surgeon: [name], MD;  Location: [facility];  Service: General;   Laterality: N/A;   [place] PARTIAL COLECTOMY  05/12/2019   [place]    Social History:  reports that he has never smoked. He has never used smokeless tobacco. He reports that he does not currently use alcohol. He reports that he does not use drugs.  Family History:  Family History  Problem Relation Age of Onset   Diabetes Mother    Ovarian cancer Sister      Prior to Admission medications   Medication Sig Start Date End Date Taking? Authorizing Provider  aspirin EC 81 MG tablet Take by mouth. 07/04/21 07/04/22  [provider]  brimonidine (ALPHAGAN) 0.2 % ophthalmic solution Place 1 drop into both eyes 2 (two) times daily. 04/23/21   [provider]  lisinopril (ZESTRIL) 10 MG tablet Take 10 mg by mouth daily. 06/07/21   [provider]  metoprolol succinate (TOPROL-XL) 50 MG 24 hr tablet Take 1 tablet by mouth 1 day or 1 dose. 09/02/18   [provider]  NOVOLOG MIX 70/30 FLEXPEN (70-30) 100 UNIT/ML FlexPen Inject 10 Units into the skin 2 (two) times daily with a meal. 12/13/19   [provider]  pantoprazole (PROTONIX) 40 MG tablet Take 1 tablet (40 mg total) by mouth 2 (two) times daily before a meal. 10/19/18   Sindy Guadeloupe, MD  ROCKLATAN 0.02-0.005 % SOLN Place 1 drop into both eyes at bedtime. 09/18/19   [provider]  rosuvastatin (CRESTOR) 10 MG tablet Take 10 mg by mouth daily. 12/08/19   [provider]  traMADol (ULTRAM) 50 MG tablet Take 1 tablet (50 mg total) by mouth every 6 (six) hours as needed. 09/19/21   Harvest Dark, MD  traZODone (DESYREL) 50 MG tablet Take 50 mg by mouth at bedtime as needed for sleep.     [provider]  prochlorperazine (COMPAZINE) 10 MG tablet TAKE 1 TABLET BY MOUTH EVERY 6 HOURS AS NEEDED NAUSEA AND VOMITING 11/06/18 12/28/18  Sindy Guadeloupe, MD    Physical Exam: Vitals:   09/23/21 1000 09/23/21 1227 09/23/21 1600 09/23/21 1647  BP:   (!) 145/74 (!) 152/84 (!) 162/83  Pulse:  90 100 94  Resp:  '16 16 18  '$ Temp: 99.1 F (37.3 C) 97.9 F (36.6 C) 98.5 F (36.9 C) (!) 97.5 F (36.4 C)  TempSrc: Oral Oral Oral Oral  SpO2:  96% 95% 95%  Weight:      Height:       General: Not in acute distress HEENT:       Eyes: PERRL, EOMI, no scleral icterus.       ENT: No discharge from the ears and nose, no pharynx injection, no tonsillar enlargement.        Neck: No JVD, no bruit, no mass felt. Heme: No neck lymph node enlargement. Cardiac: S1/S2, RRR, No murmurs, No gallops or rubs. Respiratory: No rales, wheezing, rhonchi or rubs. GI: Soft, nondistended, has central abdominal tenderness, no rebound pain, no organomegaly, BS present.  S/p of colostomy GU: No hematuria Ext: No pitting leg edema bilaterally. 1+DP/PT pulse bilaterally. Musculoskeletal: No joint deformities, No joint redness or warmth, no limitation of ROM in spin. Skin: No rashes.  Neuro: Alert, oriented X3, cranial nerves II-XII grossly intact,  moves all extremities normally.   Psych: Patient is not psychotic, no suicidal or hemocidal ideation.  Labs on Admission: I have personally reviewed following labs and imaging studies  CBC: Recent Labs  Lab 09/19/21 0834 09/23/21 0422  WBC 4.2 5.8  NEUTROABS  --  3.6  HGB 13.5 15.5  HCT 40.4 46.5  MCV 88.4 86.8  PLT 181 024   Basic Metabolic Panel: Recent Labs  Lab 09/19/21 0834 09/23/21 0422  NA 137 135  K 3.8 4.1  CL 107 103  CO2 23 21*  GLUCOSE 182* 208*  BUN 21 26*  CREATININE 1.37* 1.45*  CALCIUM 8.9 9.6   GFR: Estimated Creatinine Clearance: 36.5 mL/min (A) (by C-G formula based on SCr of 1.45 mg/dL (H)). Liver Function Tests: Recent Labs  Lab 09/23/21 0422  AST 21  ALT 19  ALKPHOS 81  BILITOT 0.8  PROT 8.3*  ALBUMIN 4.1   Recent Labs  Lab 09/23/21 0422  LIPASE 21   No results for input(s): "AMMONIA" in the last 168 hours. Coagulation Profile: Recent Labs  Lab 09/23/21 1158   INR 1.1   Cardiac Enzymes: No results for input(s): "CKTOTAL", "CKMB", "CKMBINDEX", "TROPONINI" in the last 168 hours. BNP (last 3 results) No results for input(s): "PROBNP" in the last 8760 hours. HbA1C: No results for input(s): "HGBA1C" in the last 72 hours. CBG: Recent Labs  Lab 09/23/21 0947 09/23/21 1228  GLUCAP 174* 175*   Lipid Profile: No results for input(s): "CHOL", "HDL", "LDLCALC", "TRIG", "CHOLHDL", "LDLDIRECT" in the last 72 hours. Thyroid Function Tests: No results for input(s): "TSH", "T4TOTAL", "FREET4", "T3FREE", "THYROIDAB" in the last 72 hours. Anemia Panel: No results for input(s): "VITAMINB12", "FOLATE", "FERRITIN", "TIBC", "IRON", "RETICCTPCT" in the last 72 hours. Urine analysis:    Component Value Date/Time   COLORURINE YELLOW (A) 09/23/2021 0521   APPEARANCEUR HAZY (A) 09/23/2021 0521   LABSPEC 1.020 09/23/2021 0521   PHURINE 5.0 09/23/2021 0521   GLUCOSEU NEGATIVE 09/23/2021 0521   HGBUR SMALL (A) 09/23/2021 0521   BILIRUBINUR NEGATIVE 09/23/2021 0521   KETONESUR 20 (A) 09/23/2021 0521   PROTEINUR 100 (A) 09/23/2021 0521   NITRITE NEGATIVE 09/23/2021 0521   LEUKOCYTESUR NEGATIVE 09/23/2021 0521   Sepsis Labs: '@LABRCNTIP'$ (procalcitonin:4,lacticidven:4) )No results found for this or any previous visit (from the past 240 hour(s)).   Radiological Exams on Admission: DG Abd 1 View  Result Date: 09/23/2021 CLINICAL DATA:  Enteric tube placement EXAM: ABDOMEN - 1 VIEW COMPARISON:  10/16/2018 abdominal radiograph FINDINGS: Enteric tube terminates in the gastric fundus. No dilated small bowel loops. No evidence of pneumatosis or pneumoperitoneum in the upper abdomen. Clear lung bases. Mild lumbar spondylosis. IMPRESSION: Enteric tube terminates in the gastric fundus. Electronically Signed   By: Ilona Sorrel M.D.   On: 09/23/2021 11:22   CT Abdomen Pelvis W Contrast  Result Date: 09/23/2021 CLINICAL DATA:  Nausea, vomiting and abdominal pain with  decreased stool output from colostomy. EXAM: CT ABDOMEN AND PELVIS WITH CONTRAST TECHNIQUE: Multidetector CT imaging of the abdomen and pelvis was performed using the standard protocol following bolus administration of intravenous contrast. RADIATION DOSE REDUCTION: This exam was performed according to the departmental dose-optimization program which includes automated exposure control, adjustment of the mA and/or kV according to patient size and/or use of iterative reconstruction technique. CONTRAST:  149m OMNIPAQUE IOHEXOL 300 MG/ML  SOLN COMPARISON:  Study with IV contrast 08/01/2021, study with IV and oral contrast 05/17/2021. FINDINGS: Lower chest: No acute abnormality. The cardiac size is normal.  There is a stent in the distal LAD coronary artery. Small hiatal hernia with mildly thickened distal thoracic esophagus. Hepatobiliary: The liver mildly steatotic. There is no mass enhancement. Stable tiny hypodensity in the dome of the left hepatic lobe on 2:12 probable tiny cyst. Otherwise unremarkable liver. Unremarkable gallbladder and bile ducts. Pancreas: Partially atrophic and otherwise unremarkable. Spleen: Small wedge-shaped hypodensity in the subcapsular posterior spleen most likely a small infarct and was seen previously. Otherwise unremarkable spleen. Adrenals/Urinary Tract: No adrenal mass. 2.2 cm cyst inferior pole right kidney. Stable chronic perinephric stranding and trace perinephric fluid. There is no renal mass calculus or urinary obstruction. The bladder is contracted and not well seen. Stomach/Bowel: Gastric fluid distention is again noted. The appendix is normal. Distal colectomy again seen and appears to have been either at the junction of the transverse and descending colon or in the proximal descending colon. Residual left colonic output through a left mid abdominal colostomy. There are diffuse diverticula without evidence of diverticulitis. Most of the small bowel is normal caliber but there  are fluid-filled dilated segments in the pelvis with extension into the left iliac fossa, which again show mucosal enhancement. This was seen on 08/01/2021 but these segments were normal caliber on 05/17/2021. Given persistence of the findings, with a few segments dilated up to 3.4 cm, this could represent a low-grade partial small bowel obstruction possibly related to adhesions from the prior surgery. The wall enhancement in the segments may relate to prior XRT to the area or other ongoing etiology for nonspecific enteritis. There are no inflammatory changes associated with this but there is a small volume of pelvic ascites which was not seen previously noted above bladder and in the posterior deep pelvis. Vascular/Lymphatic: Aortic atherosclerosis. No enlarged abdominal or pelvic lymph nodes. Reproductive: Surgically absent prostate. Other: Presacral soft tissue thickening continues to be noted but is not significantly changed. There are small umbilical and inguinal fat hernias. There is no free air, abscess or free hemorrhage. Musculoskeletal: There are degenerative changes of the spine and hip joints. No aggressive or destructive bone lesions. Bridging osteophytes both anterior SI joints. IMPRESSION: 1. There is continued dilatation of pelvic small bowel segments up to 3.4 cm with wall enhancement, while the remainder of the small bowel remains normal caliber. Lack of interval change in this finding suggests a probable low-grade partial small bowel obstruction related to the prior surgery with adhesions, with the wall enhancement nonspecific and could be from prior XRT or ongoing enteritis of other etiology. There is no bowel pneumatosis, abscess or free air, or acute inflammatory changes. 2. Prior distal colectomy with left abdominal colostomy, diverticula along the residual large bowel but no evidence of diverticulitis. 3. Aortic atherosclerosis. 4. Small amount of scattered pelvic ascites not seen previously.  No localizing collection. 5. Umbilical and inguinal fat hernias. Electronically Signed   By: Telford Nab M.D.   On: 09/23/2021 06:04   DG Chest Port 1 View  Result Date: 09/23/2021 CLINICAL DATA:  81 year old male with vomiting and abdominal pain. History of rectal cancer. EXAM: PORTABLE CHEST 1 VIEW COMPARISON:  Chest radiograph 09/19/2021 and earlier. FINDINGS: Portable AP semi upright view at 0437 hours. Lung volumes and mediastinal contours remain within normal limits. Visualized tracheal air column is within normal limits. Allowing for portable technique the lungs are clear. No pneumothorax or pneumoperitoneum. Paucity of bowel gas in the upper abdomen. No acute osseous abnormality identified. IMPRESSION: Negative portable chest. Electronically Signed   By: Lemmie Evens  [place] Crane M.D.   On: 09/23/2021 05:10      Assessment/Plan Principal Problem:   SBO (small bowel obstruction) (HCC) Active Problems:   Type II diabetes mellitus with renal manifestations (HCC)   Ventricular tachycardia (HCC)   Essential hypertension   Hypercholesteremia   Chronic kidney disease, stage 3a (HCC)   CAD (coronary artery disease)   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HCC)   Assessment and Plan: * SBO (small bowel obstruction) (HCC) CT scan showed low-grade small bowel obstruction.  Likely due to adhesion.  Consulted Dr. Milas Gain of surgery  -Admit to telemetry bed as inpatient -NG tube is ordered by Dr. Christian Mate -N.p.o. now -As needed morphine, Zofran -IV fluid: 1 L normal saline, followed by 75 cc/h - hold oral meds due to NG tube placement  Type II diabetes mellitus with renal manifestations (HCC) Recent A1c 8.0, poorly controlled.  Patient taking 70/30 insulin 12 unit twice daily -Sliding scale insulin -70/30 insulin 6 unit bid  Ventricular tachycardia (HCC) Heart rate 116, 98 -Hold home metoprolol -tele monitoring  Essential hypertension - hold lisinopril, metoprolol -IV hydralazine as  needed  Hypercholesteremia -hold Crestor  Chronic kidney disease, stage 3a (HCC) Renal function is close to baseline.  Recent baseline creatinine 1.2-1.4.  His creatinine is 1.48, BUN 26 today. -Monitor renal function by BMP  CAD (coronary artery disease) No chest pain. -Hold aspirin, metoprolol, Crestor  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HCC) S/p of colectomy, radiation and chemotherapy. -S/p of colostomy -Follow-up with oncology          DVT ppx: SCD  Code Status: DNR (I discussed with patient in the presence of his nephew, and explained the meaning of CODE STATUS. Patient wants to be DNR)  Family Communication:   Yes, patient's nephew   at bed side.   Disposition Plan:  Anticipate discharge back to previous environment  Consults called: Dr. Christian Mate of surgery  Admission status and Level of care: Telemetry Medical:     as inpt       Severity of Illness:  The appropriate patient status for this patient is INPATIENT. Inpatient status is judged to be reasonable and necessary in order to provide the required intensity of service to ensure the patient's safety. The patient's presenting symptoms, physical exam findings, and initial radiographic and laboratory data in the context of their chronic comorbidities is felt to place them at high risk for further clinical deterioration. Furthermore, it is not anticipated that the patient will be medically stable for discharge from the hospital within 2 midnights of admission.   * I certify that at the point of admission it is my clinical judgment that the patient will require inpatient hospital care spanning beyond 2 midnights from the point of admission due to high intensity of service, high risk for further deterioration and high frequency of surveillance required.*       Date of Service 09/23/2021    Ivor Costa [facility]   If 7PM-7AM, please contact night-coverage [email or web address] 09/23/2021, 5:53 PM

## 2021-09-23 NOTE — Assessment & Plan Note (Addendum)
Recent A1c 8.0, poorly controlled.  Patient taking 70/30 insulin 12 unit twice daily -Sliding scale insulin -70/30 insulin 6 unit bid

## 2021-09-23 NOTE — ED Notes (Addendum)
No needs at this time. Pt is able to stand and pivot from stretcher to bed at this time.

## 2021-09-23 NOTE — Assessment & Plan Note (Addendum)
CT scan showed low-grade small bowel obstruction.  Likely due to adhesion.  Consulted Dr. Milas Gain of surgery  -Admit to telemetry bed as inpatient -NG tube is ordered by Dr. Christian Mate -N.p.o. now -As needed morphine, Zofran -IV fluid: 1 L normal saline, followed by 75 cc/h - hold oral meds due to NG tube placement

## 2021-09-23 NOTE — Consult Note (Signed)
[facility] SURGICAL CONSULTATION NOTE (initial) - cpt: 99254   HISTORY OF PRESENT ILLNESS (HPI):  81 y.o. [name] presented to [facility] ED today for evaluation of a 2-day history of abdominal pain with associated nausea and vomiting.  The emesis is nonbilious, hematemesis denied.  Abdominal pain is nonspecific, not well localized without radiation but relatively constant without waxing and waning.  He notes diminished colostomy output though he did have gas in his colostomy bag on presentation.  Patient reports having prior episodes of this in the past that have been given time to resolve.  Surgery is consulted by hospitalist physician Dr. Blaine Hamper in this context for evaluation and management of suspected partial small bowel obstruction based on CT scan exam.  Patient reports vomiting prior to coming to the emergency department, not since he has had the CT.  CT was notable for some gastric dilation with of fairly large amount of fluid.  Small bowel was not terribly remarkable proximally but there is an area within the pelvis that likely is altered by his radiation therapy from his rectal cancer treatment.  PAST MEDICAL HISTORY (PMH):  Past Medical History:  Diagnosis Date   Anemia    Coronary artery disease    Diabetes mellitus without complication (HCC)    GERD (gastroesophageal reflux disease)    History of hiatal hernia    Hypercholesterolemia    Hypertension    Prostate cancer ([place]) 2000   Prostatectomy.    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[place]) 09/2018   Chemo tx's.    Wears dentures    partial lower     PAST SURGICAL HISTORY [facility]):  Past Surgical History:  Procedure Laterality Date   CATARACT EXTRACTION W/PHACO Right 10/12/2019   Procedure: CATARACT EXTRACTION PHACO AND INTRAOCULAR LENS PLACEMENT (IOC) RIGHT DIABETIC 6.35 00:37.1;  Surgeon: Birder Robson, MD;  Location: [place];  Service: Ophthalmology;  Laterality: Right;   COLONOSCOPY N/A 05/02/2021   Procedure: COLONOSCOPY;   Surgeon: [name], [name], MD;  Location: [facility] ENDOSCOPY;  Service: Gastroenterology;  Laterality: N/A;  IDDM   COLONOSCOPY WITH PROPOFOL N/A 07/15/2018   Procedure: COLONOSCOPY WITH PROPOFOL;  Surgeon: [name], [name], MD;  Location: [facility] ENDOSCOPY;  Service: Gastroenterology;  Laterality: N/A;   ESOPHAGOGASTRODUODENOSCOPY N/A 10/12/2018   Procedure: ESOPHAGOGASTRODUODENOSCOPY (EGD);  Surgeon: [name], MD;  Location: [facility] ENDOSCOPY;  Service: Gastroenterology;  Laterality: N/A;   ESOPHAGOGASTRODUODENOSCOPY (EGD) WITH PROPOFOL N/A 07/15/2018   Procedure: ESOPHAGOGASTRODUODENOSCOPY (EGD) WITH PROPOFOL;  Surgeon: [name], [name], MD;  Location: [facility] ENDOSCOPY;  Service: Gastroenterology;  Laterality: N/A;   PORTACATH PLACEMENT N/A 08/13/2018   Procedure: INSERTION PORT-A-CATH;  Surgeon: [name], MD;  Location: [facility] ORS;  Service: General;  Laterality: N/A;   PROSTATE SURGERY     ROBOT ASSISTED LAPAROSCOPIC PARTIAL COLECTOMY  05/12/2019   [place]     MEDICATIONS:  Prior to Admission medications   Medication Sig Start Date End Date Taking? Authorizing Provider  aspirin EC 81 MG tablet Take by mouth. 07/04/21 07/04/22  [provider]  brimonidine (ALPHAGAN) 0.2 % ophthalmic solution Place 1 drop into both eyes 2 (two) times daily. 04/23/21   [provider]  lisinopril (ZESTRIL) 10 MG tablet Take 10 mg by mouth daily. 06/07/21   [provider]  metoprolol succinate (TOPROL-XL) 50 MG 24 hr tablet Take 1 tablet by mouth 1 day or 1 dose. 09/02/18   [provider]  NOVOLOG MIX 70/30 FLEXPEN (70-30) 100 UNIT/ML FlexPen Inject 10 Units into the skin 2 (  two) times daily with a meal. 12/13/19   [provider]  pantoprazole (PROTONIX) 40 MG tablet Take 1 tablet (40 mg total) by mouth 2 (two) times daily before a meal. 10/19/18   Sindy Guadeloupe, MD  ROCKLATAN 0.02-0.005 % SOLN Place 1 drop into both eyes at bedtime. 09/18/19   [provider]  rosuvastatin (CRESTOR) 10 MG tablet Take 10 mg by mouth daily. 12/08/19   [provider]  traMADol (ULTRAM) 50 MG tablet Take 1 tablet (50 mg total) by mouth every 6 (six) hours as needed. 09/19/21   Harvest Dark, MD  traZODone (DESYREL) 50 MG tablet Take 50 mg by mouth at bedtime as needed for sleep.     [provider]  prochlorperazine (COMPAZINE) 10 MG tablet TAKE 1 TABLET BY MOUTH EVERY 6 HOURS AS NEEDED NAUSEA AND VOMITING 11/06/18 12/28/18  Sindy Guadeloupe, MD     ALLERGIES:  No Known Allergies   SOCIAL HISTORY:  Social History   Socioeconomic History   Marital status: Widowed    Spouse name: Not on file   Number of children: 0   Years of education: Not on file   Highest education level: Not on file  Occupational History   Occupation: Pharmacist, hospital    Comment: Retired  Tobacco Use   Smoking status: Never   Smokeless tobacco: Never  Vaping Use   Vaping Use: Never used  Substance and Sexual Activity   Alcohol use: Not Currently   Drug use: Never   Sexual activity: Not Currently  Other Topics Concern   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  His wife was a Camera operator and they traveled frequently.  Several nieces and nephews.  He is the youngest of his brothers and sisters and only surviving.   Social Determinants of Health   Financial Resource Strain: Low Risk  (11/01/2017)   Overall Financial Resource Strain (CARDIA)    Difficulty of Paying Living Expenses: Not hard at all  Food Insecurity: No Food Insecurity (11/01/2017)   Hunger Vital Sign    Worried About Running Out of Food in the Last Year: Never true    Ran Out of Food in the Last Year: Never true  Transportation Needs: No Transportation Needs (11/01/2017)   PRAPARE - Hydrologist (Medical): No    Lack of Transportation  (Non-Medical): No  Physical Activity: Insufficiently Active (11/01/2017)   Exercise Vital Sign    Days of Exercise per Week: 2 days    Minutes of Exercise per Session: 30 min  Stress: No Stress Concern Present (11/01/2017)   [place]    Feeling of Stress : Only a little  Social Connections: Unknown (08/31/2018)   Social Connection and Isolation Panel [NHANES]    Frequency of Communication with Friends and Family: More than three times a week    Frequency of Social Gatherings with Friends and Family: More than three times a week    Attends Religious Services: Not on file    Active Member of Clubs or Organizations: Not on file    Attends Archivist Meetings: Not on file    Marital Status: Not on file  Intimate Partner Violence: Not At Risk (08/31/2018)   Humiliation, Afraid, Rape, and Kick questionnaire    Fear of Current  or Ex-Partner: No    Emotionally Abused: No    Physically Abused: No    Sexually Abused: No     FAMILY HISTORY:  History reviewed. No pertinent family history.    REVIEW OF SYSTEMS:  ROS General: no fevers, chills, no body weight gain, has fatigue HEENT: no blurry vision, hearing changes or sore throat Respiratory: no dyspnea, coughing, wheezing CV: no chest pain, no palpitations GI: has nausea, vomiting, abdominal pain, no diarrhea GU: no dysuria, burning on urination, increased urinary frequency, hematuria  Ext: no leg edema Neuro: no unilateral weakness, numbness, or tingling, no vision change or hearing loss Skin: no rash, no skin tear. MSK: No muscle spasm, no deformity, no limitation of range of movement  Heme: No easy bruising.  Travel history: No recent long distant travel   VITAL SIGNS:  Temp:  [99.1 F (37.3 C)] 99.1 F (37.3 C) (07/16 0421) Pulse Rate:  [90-116] 90 (07/16 0730) Resp:  [20-24] 20 (07/16 0730) BP: (155-179)/(75-85) 179/85 (07/16 0730) SpO2:  [97 %] 97  % (07/16 0730) Weight:  [81.6 kg] 81.6 kg (07/16 0740)     Height: '5\' 3"'$  (160 cm) Weight: 81.6 kg BMI (Calculated): 31.89   INTAKE/OUTPUT:  07/15 0701 - 07/16 0700 In: 1000 [IV Piggyback:1000] Out: -   PHYSICAL EXAM:  Physical Exam Blood pressure (!) 179/85, pulse 90, temperature 99.1 F (37.3 C), temperature source Oral, resp. rate 20, height '5\' 3"'$  (1.6 m), weight 81.6 kg, SpO2 97 %. Last Weight  Most recent update: 09/23/2021  7:40 AM    Weight  81.6 kg (180 lb)             CONSTITUTIONAL: Well developed, and nourished, appropriately responsive and aware without distress.   EYES: Sclera non-icteric, though markedly reddened. EARS, NOSE, MOUTH AND THROAT: Mask worn.   The oropharynx is clear.   Hearing is intact to voice.  NECK: Trachea is midline, and there is no jugular venous distension.  LYMPH NODES:  Lymph nodes in the neck are not enlarged. RESPIRATORY:  Lungs are clear, and breath sounds are equal bilaterally. Normal respiratory effort without pathologic use of accessory muscles. CARDIOVASCULAR: Heart is regular in rate and rhythm. GI: The abdomen is notable for a left-sided colostomy, surgical scar, otherwise soft, nontender, and nondistended. There were no palpable masses. I did not appreciate hepatosplenomegaly. There were normal bowel sounds. MUSCULOSKELETAL:  Symmetrical muscle tone appreciated in all four extremities.    SKIN: Skin turgor is normal. No pathologic skin lesions appreciated.  NEUROLOGIC:  Motor and sensation appear grossly normal.  Cranial nerves are grossly without defect. PSYCH:  Alert and oriented to person, place and time. Affect is appropriate for situation.  Data Reviewed I have personally reviewed what is currently available of the patient's imaging, recent labs and medical records.    Labs:     Latest Ref Rng & Units 09/23/2021    4:22 AM 09/19/2021    8:34 AM 08/01/2021    5:35 PM  CBC  WBC 4.0 - 10.5 K/uL 5.8  4.2  5.1   Hemoglobin 13.0 -  17.0 g/dL 15.5  13.5  15.2   Hematocrit 39.0 - 52.0 % 46.5  40.4  47.5   Platelets 150 - 400 K/uL 216  181  207       Latest Ref Rng & Units 09/23/2021    4:22 AM 09/19/2021    8:34 AM 08/01/2021    6:24 PM  CMP  Glucose 70 - 99  mg/dL 208  182  142   BUN 8 - 23 mg/dL '26  21  29   '$ Creatinine 0.61 - 1.24 mg/dL 1.45  1.37  1.47   Sodium 135 - 145 mmol/L 135  137  139   Potassium 3.5 - 5.1 mmol/L 4.1  3.8  4.9   Chloride 98 - 111 mmol/L 103  107  106   CO2 22 - 32 mmol/L '21  23  19   '$ Calcium 8.9 - 10.3 mg/dL 9.6  8.9  9.6   Total Protein 6.5 - 8.1 g/dL 8.3   8.4   Total Bilirubin 0.3 - 1.2 mg/dL 0.8   0.9   Alkaline Phos 38 - 126 U/L 81   71   AST 15 - Willie U/L 21   26   ALT 0 - 44 U/L 19   18      Imaging studies:   Last 24 hrs: CT Abdomen Pelvis W Contrast  Result Date: 09/23/2021 CLINICAL DATA:  Nausea, vomiting and abdominal pain with decreased stool output from colostomy. EXAM: CT ABDOMEN AND PELVIS WITH CONTRAST TECHNIQUE: Multidetector CT imaging of the abdomen and pelvis was performed using the standard protocol following bolus administration of intravenous contrast. RADIATION DOSE REDUCTION: This exam was performed according to the departmental dose-optimization program which includes automated exposure control, adjustment of the mA and/or kV according to patient size and/or use of iterative reconstruction technique. CONTRAST:  145m OMNIPAQUE IOHEXOL 300 MG/ML  SOLN COMPARISON:  Study with IV contrast 08/01/2021, study with IV and oral contrast 05/17/2021. FINDINGS: Lower chest: No acute abnormality. The cardiac size is normal. There is a stent in the distal LAD coronary artery. Small hiatal hernia with mildly thickened distal thoracic esophagus. Hepatobiliary: The liver mildly steatotic. There is no mass enhancement. Stable tiny hypodensity in the dome of the left hepatic lobe on 2:12 probable tiny cyst. Otherwise unremarkable liver. Unremarkable gallbladder and bile ducts. Pancreas:  Partially atrophic and otherwise unremarkable. Spleen: Small wedge-shaped hypodensity in the subcapsular posterior spleen most likely a small infarct and was seen previously. Otherwise unremarkable spleen. Adrenals/Urinary Tract: No adrenal mass. 2.2 cm cyst inferior pole right kidney. Stable chronic perinephric stranding and trace perinephric fluid. There is no renal mass calculus or urinary obstruction. The bladder is contracted and not well seen. Stomach/Bowel: Gastric fluid distention is again noted. The appendix is normal. Distal colectomy again seen and appears to have been either at the junction of the transverse and descending colon or in the proximal descending colon. Residual left colonic output through a left mid abdominal colostomy. There are diffuse diverticula without evidence of diverticulitis. Most of the small bowel is normal caliber but there are fluid-filled dilated segments in the pelvis with extension into the left iliac fossa, which again show mucosal enhancement. This was seen on 08/01/2021 but these segments were normal caliber on 05/17/2021. Given persistence of the findings, with a few segments dilated up to 3.4 cm, this could represent a low-grade partial small bowel obstruction possibly related to adhesions from the prior surgery. The wall enhancement in the segments may relate to prior XRT to the area or other ongoing etiology for nonspecific enteritis. There are no inflammatory changes associated with this but there is a small volume of pelvic ascites which was not seen previously noted above bladder and in the posterior deep pelvis. Vascular/Lymphatic: Aortic atherosclerosis. No enlarged abdominal or pelvic lymph nodes. Reproductive: Surgically absent prostate. Other: Presacral soft tissue thickening continues to be noted but  is not significantly changed. There are small umbilical and inguinal fat hernias. There is no free air, abscess or free hemorrhage. Musculoskeletal: There are  degenerative changes of the spine and hip joints. No aggressive or destructive bone lesions. Bridging osteophytes both anterior SI joints. IMPRESSION: 1. There is continued dilatation of pelvic small bowel segments up to 3.4 cm with wall enhancement, while the remainder of the small bowel remains normal caliber. Lack of interval change in this finding suggests a probable low-grade partial small bowel obstruction related to the prior surgery with adhesions, with the wall enhancement nonspecific and could be from prior XRT or ongoing enteritis of other etiology. There is no bowel pneumatosis, abscess or free air, or acute inflammatory changes. 2. Prior distal colectomy with left abdominal colostomy, diverticula along the residual large bowel but no evidence of diverticulitis. 3. Aortic atherosclerosis. 4. Small amount of scattered pelvic ascites not seen previously. No localizing collection. 5. Umbilical and inguinal fat hernias. Electronically Signed   By: Telford Nab M.D.   On: 09/23/2021 06:04   DG Chest Port 1 View  Result Date: 09/23/2021 CLINICAL DATA:  81 year old [name] with vomiting and abdominal pain. History of rectal cancer. EXAM: PORTABLE CHEST 1 VIEW COMPARISON:  Chest radiograph 09/19/2021 and earlier. FINDINGS: Portable AP semi upright view at 0437 hours. Lung volumes and mediastinal contours remain within normal limits. Visualized tracheal air column is within normal limits. Allowing for portable technique the lungs are clear. No pneumothorax or pneumoperitoneum. Paucity of bowel gas in the upper abdomen. No acute osseous abnormality identified. IMPRESSION: Negative portable chest. Electronically Signed   By: Genevie Ann M.D.   On: 09/23/2021 05:10     Assessment/Plan:  81 y.o. [name] with possible partial small bowel obstruction, complicated by pertinent comorbidities including prior pelvic radiation, surgery for rectal cancer with end colostomy.  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   Chemotherapy induced neutropenia ([place]) 03/11/2019   Hypokalemia 10/27/2018   Protein-calorie malnutrition, severe 10/12/2018   Sepsis ([place]) 10/10/2018   Hypercholesteremia 08/17/2018   Goals of care, counseling/discussion 08/05/2018   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[place]) 08/05/2018   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   History of gastric ulcer 07/07/2018   Ventricular tachycardia ([place]) 10/31/2017   Benign prostatic hyperplasia 09/16/2017   Controlled type 2 diabetes mellitus without complication, with long-term current use of insulin ([place]) 09/16/2017   Coronary artery disease involving native heart without angina pectoris 09/16/2017   Essential hypertension 09/16/2017   Onychomycosis due to dermatophyte 05/28/2013    -Considering the gastric distention and fluid in his stomach, I believe an NG tube would be helpful/beneficial for this patient despite the fact there is very little proximal small bowel dilatation.  -Appreciate hospitalist admission, IV fluid resuscitation, PPI prophylaxis.  -Serial exams, KUB in the morning.  - DVT prophylaxis  -We will follow along with you All of the above findings and recommendations were discussed with the patient and  family(if present), and all of patient's and present family's questions were answered to their expressed satisfaction.  Thank you for the opportunity to participate in this patient's care.   -- Ronny Bacon,  M.D., FACS 09/23/2021, 8:08 AM

## 2021-09-23 NOTE — ED Notes (Signed)
Dr. Blaine Hamper at bedside at this time.

## 2021-09-23 NOTE — Assessment & Plan Note (Signed)
S/p of colectomy, radiation and chemotherapy. -S/p of colostomy -Follow-up with oncology

## 2021-09-23 NOTE — Assessment & Plan Note (Addendum)
No chest pain. Continue aspirin statin and beta-blocker

## 2021-09-23 NOTE — Assessment & Plan Note (Addendum)
Heart rate 116, 98 -Hold home metoprolol -tele monitoring

## 2021-09-23 NOTE — ED Provider Notes (Addendum)
[facility] Provider Note    Event Date/Time   First MD Initiated Contact with Patient 09/23/21 [phone]     (approximate)   History   Abdominal Pain, Nausea, and Emesis   HPI  Willie [name] is a 81 y.o. male brought to the ED via EMS from home with a chief complaint of abdominal pain, nausea and vomiting.  Patient with a history of CAD, hypertension, diabetes, colon cancer status post colectomy with colostomy who has been experiencing abdominal pain and decreased ostomy output for the past 2 days.  Vomited in the ambulance prior to arrival.  States this is happened previously but not for this long.  States he noticed some ostomy output after vomiting in the ambulance.  Denies fever, cough, chest pain, shortness of breath, dysuria, diarrhea.     Past Medical History   Past Medical History:  Diagnosis Date   Anemia    Coronary artery disease    Diabetes mellitus without complication (HCC)    GERD (gastroesophageal reflux disease)    History of hiatal hernia    Hypercholesterolemia    Hypertension    Prostate cancer ([place]) 2000   Prostatectomy.    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[place]) 09/2018   Chemo tx's.    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   History of gastric ulcer 07/07/2018   Ventricular tachycardia ([place]) 10/31/2017   Benign prostatic hyperplasia 09/16/2017   Controlled type 2  diabetes mellitus without complication, with long-term current use of insulin ([place]) 09/16/2017   Coronary artery disease involving native heart without angina pectoris 09/16/2017   Essential hypertension 09/16/2017   Onychomycosis due to dermatophyte 05/28/2013     Past Surgical History   Past Surgical History:  Procedure Laterality Date   CATARACT EXTRACTION W/PHACO Right 10/12/2019   Procedure: CATARACT EXTRACTION PHACO AND INTRAOCULAR LENS PLACEMENT (IOC) RIGHT DIABETIC 6.35 00:37.1;  Surgeon: Birder Robson, MD;  Location: [place];  Service: Ophthalmology;  Laterality: Right;   COLONOSCOPY N/A 05/02/2021   Procedure: COLONOSCOPY;  Surgeon: [name], [name], MD;  Location: [facility] ENDOSCOPY;  Service: Gastroenterology;  Laterality: N/A;  IDDM   COLONOSCOPY WITH PROPOFOL N/A 07/15/2018   Procedure: COLONOSCOPY WITH PROPOFOL;  Surgeon: [name], [name], MD;  Location: [facility] ENDOSCOPY;  Service: Gastroenterology;  Laterality: N/A;   ESOPHAGOGASTRODUODENOSCOPY N/A 10/12/2018   Procedure: ESOPHAGOGASTRODUODENOSCOPY (EGD);  Surgeon: [name], MD;  Location: [facility] ENDOSCOPY;  Service: Gastroenterology;  Laterality: N/A;   ESOPHAGOGASTRODUODENOSCOPY (EGD) WITH PROPOFOL N/A 07/15/2018   Procedure: ESOPHAGOGASTRODUODENOSCOPY (EGD) WITH PROPOFOL;  Surgeon: [name], [name], MD;  Location: [facility] ENDOSCOPY;  Service: Gastroenterology;  Laterality: N/A;   PORTACATH PLACEMENT N/A 08/13/2018   Procedure: INSERTION PORT-A-CATH;  Surgeon: [name], MD;  Location: [facility] ORS;  Service: General;  Laterality: N/A;   [place] PARTIAL COLECTOMY  05/12/2019   [place]     Home Medications  Prior to Admission medications   Medication Sig Start Date End Date Taking? Authorizing Provider  aspirin EC 81 MG tablet Take by mouth. 07/04/21 07/04/22  [provider]  brimonidine (ALPHAGAN) 0.2 % ophthalmic solution Place 1 drop into both eyes 2  (two) times daily. 04/23/21   [provider]  lisinopril (ZESTRIL) 10 MG tablet Take 10 mg by mouth daily. 06/07/21   [provider]  metoprolol succinate (TOPROL-XL) 50 MG 24 hr tablet Take 1 tablet by mouth 1 day or 1 dose. 09/02/18   [provider]  NOVOLOG MIX 70/30 FLEXPEN (70-30) 100 UNIT/ML FlexPen Inject 10 Units into the skin 2 (two) times daily with a meal. 12/13/19   [provider]  pantoprazole (PROTONIX) 40 MG tablet Take 1 tablet (40 mg total) by mouth 2 (two) times daily before a meal. 10/19/18   Sindy Guadeloupe, MD  ROCKLATAN 0.02-0.005 % SOLN Place 1 drop into both eyes at bedtime. 09/18/19   [provider]  rosuvastatin (CRESTOR) 10 MG tablet Take 10 mg by mouth daily. 12/08/19   [provider]  traMADol (ULTRAM) 50 MG tablet Take 1 tablet (50 mg total) by mouth every 6 (six) hours as needed. 09/19/21   Harvest Dark, MD  traZODone (DESYREL) 50 MG tablet Take 50 mg by mouth at bedtime as needed for sleep.     [provider]  prochlorperazine (COMPAZINE) 10 MG tablet TAKE 1 TABLET BY MOUTH EVERY 6 HOURS AS NEEDED NAUSEA AND VOMITING 11/06/18 12/28/18  Sindy Guadeloupe, MD     Allergies  Patient has no known allergies.   Family History  No family history on file.   Physical Exam  Triage Vital Signs: ED Triage Vitals  Enc Vitals Group     BP      Pulse      Resp      Temp      Temp src      SpO2      Weight      Height      Head Circumference      Peak Flow      Pain Score      Pain Loc      Pain Edu?      Excl. in [place]?     Updated Vital Signs: BP (!) 155/75   Pulse (!) 116   Temp 99.1 F (37.3 C) (Oral)   Resp (!) 24   SpO2 97%    General: Awake, moderate distress.  CV:  Tachycardic.  Good peripheral perfusion.  Resp:  Increased effort.  CTA B. Abd:  Mild diffuse tenderness to palpation without rebound or guarding.  Small amount of stool in ostomy.  Mild distention.  No  CVAT. Other:  Diaphoretic.  No truncal vesicles.   ED Results / Procedures / Treatments  Labs (all labs ordered are listed, but only abnormal results are displayed) Labs Reviewed  COMPREHENSIVE METABOLIC PANEL - Abnormal; Notable for the following components:      Result Value   CO2 21 (*)    Glucose, Bld 208 (*)    BUN 26 (*)    Creatinine, Ser 1.45 (*)    Total Protein 8.3 (*)    GFR, Estimated 48 (*)    All other components within normal limits  URINALYSIS, ROUTINE W REFLEX MICROSCOPIC - Abnormal; Notable for the following components:   Color, Urine YELLOW (*)    APPearance HAZY (*)    Hgb urine dipstick  SMALL (*)    Ketones, ur 20 (*)    Protein, ur 100 (*)    Bacteria, UA RARE (*)    All other components within normal limits  CBC WITH DIFFERENTIAL/PLATELET  LIPASE, BLOOD  TROPONIN I (HIGH SENSITIVITY)  TROPONIN I (HIGH SENSITIVITY)     EKG  ED ECG REPORT I, Jaxson Anglin J, the attending physician, personally viewed and interpreted this ECG.   Date: 09/23/2021  EKG Time: 0429  Rate: 107  Rhythm: sinus tachycardia  Axis: Normal  Intervals:none  ST&T Change: Nonspecific    RADIOLOGY I have independently visualized and interpreted patient's chest x-ray and CT scan as well as noted the radiology interpretation:  Chest x-ray: No acute cardiopulmonary process  CT abdomen/pelvis: low-grade SBO  Official radiology report(s): CT Abdomen Pelvis W Contrast  Result Date: 09/23/2021 CLINICAL DATA:  Nausea, vomiting and abdominal pain with decreased stool output from colostomy. EXAM: CT ABDOMEN AND PELVIS WITH CONTRAST TECHNIQUE: Multidetector CT imaging of the abdomen and pelvis was performed using the standard protocol following bolus administration of intravenous contrast. RADIATION DOSE REDUCTION: This exam was performed according to the departmental dose-optimization program which includes automated exposure control, adjustment of the mA and/or kV according to patient  size and/or use of iterative reconstruction technique. CONTRAST:  117m OMNIPAQUE IOHEXOL 300 MG/ML  SOLN COMPARISON:  Study with IV contrast 08/01/2021, study with IV and oral contrast 05/17/2021. FINDINGS: Lower chest: No acute abnormality. The cardiac size is normal. There is a stent in the distal LAD coronary artery. Small hiatal hernia with mildly thickened distal thoracic esophagus. Hepatobiliary: The liver mildly steatotic. There is no mass enhancement. Stable tiny hypodensity in the dome of the left hepatic lobe on 2:12 probable tiny cyst. Otherwise unremarkable liver. Unremarkable gallbladder and bile ducts. Pancreas: Partially atrophic and otherwise unremarkable. Spleen: Small wedge-shaped hypodensity in the subcapsular posterior spleen most likely a small infarct and was seen previously. Otherwise unremarkable spleen. Adrenals/Urinary Tract: No adrenal mass. 2.2 cm cyst inferior pole right kidney. Stable chronic perinephric stranding and trace perinephric fluid. There is no renal mass calculus or urinary obstruction. The bladder is contracted and not well seen. Stomach/Bowel: Gastric fluid distention is again noted. The appendix is normal. Distal colectomy again seen and appears to have been either at the junction of the transverse and descending colon or in the proximal descending colon. Residual left colonic output through a left mid abdominal colostomy. There are diffuse diverticula without evidence of diverticulitis. Most of the small bowel is normal caliber but there are fluid-filled dilated segments in the pelvis with extension into the left iliac fossa, which again show mucosal enhancement. This was seen on 08/01/2021 but these segments were normal caliber on 05/17/2021. Given persistence of the findings, with a few segments dilated up to 3.4 cm, this could represent a low-grade partial small bowel obstruction possibly related to adhesions from the prior surgery. The wall enhancement in the  segments may relate to prior XRT to the area or other ongoing etiology for nonspecific enteritis. There are no inflammatory changes associated with this but there is a small volume of pelvic ascites which was not seen previously noted above bladder and in the posterior deep pelvis. Vascular/Lymphatic: Aortic atherosclerosis. No enlarged abdominal or pelvic lymph nodes. Reproductive: Surgically absent prostate. Other: Presacral soft tissue thickening continues to be noted but is not significantly changed. There are small umbilical and inguinal fat hernias. There is no free air, abscess or free hemorrhage. Musculoskeletal: There are degenerative changes  of the spine and hip joints. No aggressive or destructive bone lesions. Bridging osteophytes both anterior SI joints. IMPRESSION: 1. There is continued dilatation of pelvic small bowel segments up to 3.4 cm with wall enhancement, while the remainder of the small bowel remains normal caliber. Lack of interval change in this finding suggests a probable low-grade partial small bowel obstruction related to the prior surgery with adhesions, with the wall enhancement nonspecific and could be from prior XRT or ongoing enteritis of other etiology. There is no bowel pneumatosis, abscess or free air, or acute inflammatory changes. 2. Prior distal colectomy with left abdominal colostomy, diverticula along the residual large bowel but no evidence of diverticulitis. 3. Aortic atherosclerosis. 4. Small amount of scattered pelvic ascites not seen previously. No localizing collection. 5. Umbilical and inguinal fat hernias. Electronically Signed   By: Telford Nab M.D.   On: 09/23/2021 06:04   DG Chest Port 1 View  Result Date: 09/23/2021 CLINICAL DATA:  81 year old male with vomiting and abdominal pain. History of rectal cancer. EXAM: PORTABLE CHEST 1 VIEW COMPARISON:  Chest radiograph 09/19/2021 and earlier. FINDINGS: Portable AP semi upright view at 0437 hours. Lung volumes  and mediastinal contours remain within normal limits. Visualized tracheal air column is within normal limits. Allowing for portable technique the lungs are clear. No pneumothorax or pneumoperitoneum. Paucity of bowel gas in the upper abdomen. No acute osseous abnormality identified. IMPRESSION: Negative portable chest. Electronically Signed   By: Genevie Ann M.D.   On: 09/23/2021 05:10     PROCEDURES:  Critical Care performed: Yes, see critical care procedure note(s)  CRITICAL CARE Performed by: Paulette Blanch   Total critical care time: 45 minutes  Critical care time was exclusive of separately billable procedures and treating other patients.  Critical care was necessary to treat or prevent imminent or life-threatening deterioration.  Critical care was time spent personally by me on the following activities: development of treatment plan with patient and/or surrogate as well as nursing, discussions with consultants, evaluation of patient's response to treatment, examination of patient, obtaining history from patient or surrogate, ordering and performing treatments and interventions, ordering and review of laboratory studies, ordering and review of radiographic studies, pulse oximetry and re-evaluation of patient's condition.   Marland Kitchen1-3 Lead EKG Interpretation  Performed by: Paulette Blanch, MD Authorized by: Paulette Blanch, MD     Interpretation: abnormal     ECG rate:  115   ECG rate assessment: tachycardic     Rhythm: sinus tachycardia     Ectopy: none     Conduction: normal   Comments:     Patient placed on cardiac monitor to evaluate for arrhythmias    MEDICATIONS ORDERED IN ED: Medications  sodium chloride 0.9 % bolus 1,000 mL (1,000 mLs Intravenous New Bag/Given 09/23/21 0432)  ondansetron (ZOFRAN) injection 4 mg (4 mg Intravenous Given 09/23/21 0432)  morphine (PF) 4 MG/ML injection 4 mg (4 mg Intravenous Given 09/23/21 0433)  iohexol (OMNIPAQUE) 300 MG/ML solution 100 mL (100 mLs  Intravenous Contrast Given 09/23/21 0518)     IMPRESSION / MDM / ASSESSMENT AND PLAN / ED COURSE  I reviewed the triage vital signs and the nursing notes.                             81 year old male presenting with abdominal pain, nausea and vomiting. Differential diagnosis includes, but is not limited to, acute appendicitis, renal colic, testicular torsion,  urinary tract infection/pyelonephritis, prostatitis,  epididymitis, diverticulitis, small bowel obstruction or ileus, colitis, abdominal aortic aneurysm, gastroenteritis, hernia, etc. I have personally reviewed patient's records and see that he was seen in the ED on 09/19/2021 for left shoulder pain.  He was last admitted 5/24 - 08/02/2021 for abdominal pain thought to be due to an ileus.  Patient's presentation is most consistent with acute presentation with potential threat to life or bodily function.  The patient is on the cardiac monitor to evaluate for evidence of arrhythmia and/or significant heart rate changes.  We will obtain lab work, UA, CT abdomen/pelvis.  Initiate IV fluid resuscitation, IV morphine for pain.  With IV Zofran for nausea.  Keep NPO.   Clinical Course as of 09/23/21 5397  Sun Sep 23, 2021  0609 Dated patient on laboratory results demonstrating normal WBC 5.8, AKI BUN/creatinine 26/1.45, ketonuria, normal chest x-ray.  CT demonstrates low-grade SBO.  Patient only vomited once in the ambulance prior to arrival.  Unless he continually vomits, will hold NG tube.  Will consult hospital services for evaluation and admission. [JS]    Clinical Course User Index [JS] Paulette Blanch, MD     FINAL CLINICAL IMPRESSION(S) / ED DIAGNOSES   Final diagnoses:  Generalized abdominal pain  Nausea and vomiting, unspecified vomiting type  AKI (acute kidney injury) ([place])  Dehydration  SBO (small bowel obstruction) ([place])     Rx / DC Orders   ED Discharge Orders     None        Note:  This document was prepared using  Dragon voice recognition software and may include unintentional dictation errors.   Paulette Blanch, MD 09/23/21 6734    Paulette Blanch, MD 09/23/21 [phone]

## 2021-09-23 NOTE — Assessment & Plan Note (Signed)
Renal function near baseline on admission at 1.48 with recent baseline creatinine 1.2-1.4.  Creatinine today: 1.21 -- Monitor BMP -- Avoid nephrotoxins and renally dose meds as indicated

## 2021-09-23 NOTE — ED Triage Notes (Signed)
Pt states he has had nausea/vomiting and abdominal pain for the past 2 days. He has had a colostomy bag for the last 3 years. States there has been a decrease in stool output.

## 2021-09-24 DIAGNOSIS — K56609 Unspecified intestinal obstruction, unspecified as to partial versus complete obstruction: Secondary | ICD-10-CM | POA: Diagnosis not present

## 2021-09-24 LAB — GLUCOSE, CAPILLARY
Glucose-Capillary: 156 mg/dL — ABNORMAL HIGH (ref 70–99)
Glucose-Capillary: 144 mg/dL — ABNORMAL HIGH (ref 70–99)
Glucose-Capillary: 127 mg/dL — ABNORMAL HIGH (ref 70–99)
Glucose-Capillary: 140 mg/dL — ABNORMAL HIGH (ref 70–99)

## 2021-09-24 LAB — BASIC METABOLIC PANEL
Anion gap: 11 (ref 5–15)
BUN: 21 mg/dL (ref 8–23)
CO2: 19 mmol/L — ABNORMAL LOW (ref 22–32)
Calcium: 8.5 mg/dL — ABNORMAL LOW (ref 8.9–10.3)
Chloride: 109 mmol/L (ref 98–111)
Creatinine, Ser: 1.36 mg/dL — ABNORMAL HIGH (ref 0.61–1.24)
GFR, Estimated: 52 mL/min — ABNORMAL LOW (ref >60)
Glucose, Bld: 145 mg/dL — ABNORMAL HIGH (ref 70–99)
Potassium: 3.8 mmol/L (ref 3.5–5.1)
Sodium: 139 mmol/L (ref 135–145)

## 2021-09-24 LAB — MAGNESIUM: Magnesium: 2.2 mg/dL (ref 1.7–2.4)

## 2021-09-24 MED ORDER — METOPROLOL SUCCINATE ER 50 MG PO TB24
50.0000 mg | ORAL_TABLET | Freq: Every day | ORAL | Status: DC
  Administered 2021-09-24 – 2021-09-27 (×4): 50 mg via ORAL
  Filled 2021-09-24 (×4): qty 1

## 2021-09-24 MED ORDER — ENOXAPARIN SODIUM 40 MG/0.4ML IJ SOSY
40.0000 mg | PREFILLED_SYRINGE | Freq: Once | INTRAMUSCULAR | Status: AC
  Administered 2021-09-24: 40 mg via SUBCUTANEOUS
  Filled 2021-09-24: qty 0.4

## 2021-09-24 NOTE — Progress Notes (Signed)
[facility] SURGICAL PROGRESS NOTE (cpt [id number])  Hospital Day(s): 1.   Interval History: Patient seen and examined, no acute events or new complaints overnight. Patient reports he is doing well. No significant abdominal pain, nausea, emesis. Renal function baseline; sCr - 1.36; UO - 200 ccs + unmeasured. NGT in place; 850 ccs out. No gas from ostomy.   Review of Systems:  Constitutional: denies fever, chills  HEENT: denies cough or congestion  Respiratory: denies any shortness of breath  Cardiovascular: denies chest pain or palpitations  Gastrointestinal: denies abdominal pain, N/V/D Genitourinary: denies burning with urination or urinary frequency Musculoskeletal: denies pain, decreased motor or sensation   Vital signs in last 24 hours: [min-max] current  Temp:  [97.5 F (36.4 C)-99.1 F (37.3 C)] 98.8 F (37.1 C) (07/17 0433) Pulse Rate:  [36-100] 67 (07/17 0534) Resp:  [13-27] 17 (07/17 0433) BP: (132-180)/(72-106) 132/81 (07/17 0534) SpO2:  [90 %-99 %] 99 % (07/17 0534)     Height: '5\' 3"'$  (160 cm) Weight: 81.6 kg BMI (Calculated): 31.89   Intake/Output last 2 shifts:  07/16 0701 - 07/17 0700 In: 1385.4 [I.V.:1385.4] Out: 1050 [Urine:200; Emesis/NG output:850]   Physical Exam:  Constitutional: alert, cooperative and no distress  HENT: normocephalic without obvious abnormality; NGT in place Eyes: PERRL, EOM's grossly intact and symmetric  Respiratory: breathing non-labored at rest  Cardiovascular: regular rate and sinus rhythm  Gastrointestinal: soft, non-tender, and non-distended, no rebound/guarding. 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; no output Musculoskeletal: no edema or wounds, motor and sensation grossly intact, NT    Labs:     Latest Ref Rng & Units 09/23/2021    4:22 AM 09/19/2021    8:34 AM 08/01/2021    5:35 PM  CBC  WBC 4.0 - 10.5 K/uL 5.8  4.2  5.1   Hemoglobin 13.0 - 17.0 g/dL 15.5  13.5  15.2   Hematocrit 39.0 - 52.0 % 46.5  40.4  47.5    Platelets 150 - 400 K/uL 216  181  207       Latest Ref Rng & Units 09/24/2021    4:59 AM 09/23/2021    4:22 AM 09/19/2021    8:34 AM  CMP  Glucose 70 - 99 mg/dL 145  208  182   BUN 8 - 23 mg/dL '21  26  21   '$ Creatinine 0.61 - 1.24 mg/dL 1.36  1.45  1.37   Sodium 135 - 145 mmol/L 139  135  137   Potassium 3.5 - 5.1 mmol/L 3.8  4.1  3.8   Chloride 98 - 111 mmol/L 109  103  107   CO2 22 - 32 mmol/L '19  21  23   '$ Calcium 8.9 - 10.3 mg/dL 8.5  9.6  8.9   Total Protein 6.5 - 8.1 g/dL  8.3    Total Bilirubin 0.3 - 1.2 mg/dL  0.8    Alkaline Phos 38 - 126 U/L  81    AST 15 - 41 U/L  21    ALT 0 - 44 U/L  19       Imaging studies: No new pertinent imaging studies   Assessment/Plan: (ICD-10's: K57.609) 81 y.o. male with likely small bowel obstruction, potentially partial, secondary to previous surgical and radiation therapy for colorectal cancer.    - Will continue NGT decompression for now; LIS; monitor and record output  - NPO + IVF Resuscitation  - Will get KUB in the AM. Potentially need for gastrografin challenge based on findings  -  Monitor abdominal examination; on-going bowel function - Pain control prn; antiemetics prn   - Mobilization as tolerated   - Further management per primary service; we will follow    All of the above findings and recommendations were discussed with the patient, and the medical team, and all of patient's questions were answered to his expressed satisfaction.  -- Edison Simon, PA-C [facility] 09/24/2021, 8:05 AM M-F: 7am - 4pm

## 2021-09-24 NOTE — Consult Note (Signed)
[place] NOTE       Patient ID: Willie [name] MRN: [id number] DOB/AGE: 1941-01-18 81 y.o.  Admit date: 09/23/2021 Referring Physician Dr. Nicole Kindred Primary Physician Dr. Harrel Lemon Primary Cardiologist Dr. Clayborn Bigness Reason for Consultation tachycardia   HPI: Willie [name] is an 57yoM with a PMH of CAD s/p PCI and stent placement in the 90s, hypertension, hypercholesterolemia, type 2 diabetes, IDA, colorectal cancer s/p chemo and radiation with subsequent colostomy placed in 2020 who presented to [facility] ED 09/23/2021 with abdominal pain.  Cardiology is consulted for assistance with his tachycardia.  He presented to the ER with lower abdominal pain, nausea.  He has had his colostomy for 3 years and noted over the past 3 weeks he did have output but it it seemed to be different than usual.  He noted that he not had much output from 2 days prior to presenting to the ER and his abdominal pain and gotten much worse.  He had a CT abdomen pelvis that showed some gastric dilatation with a fairly large amount of fluid and had an NG tube placed out of concern of possible partial SBO and was also made NPO and did not receive any of his home antihypertensives by mouth including his metoprolol.  At my time of evaluation his nephew and other friend are at bedside and he says he feels much better and his abdominal pain has much improved.  He denies any chest pain, palpitations, shortness of breath, or lower extremity edema.  He has been compliant with his home medicines on an outpatient basis feeling well prior to this hospitalization from a cardiac standpoint.  Recent vitals are notable for her blood pressure of 130/78, heart rate in the low 100s to 110s in ventricular trigeminy and periods of bigeminy on telemetry.  His labs are notable for potassium of 3.8, BUN/creatinine 21/1.36, GFR 52, high-sensitivity troponin negative at 07-20-2013 on multiple repeats.  H&H stable at 15/46,  platelets 216.  EKG on 7/12 showed sinus rhythm with frequent premature atrial contractions.  EKG on admission on 7/16 showed sinus tachycardia with ventricular trigeminy and a rate of 107 without acute ischemic changes.  Review of systems complete and found to be negative unless listed above     Past Medical History:  Diagnosis Date   Anemia    Coronary artery disease    Diabetes mellitus without complication (HCC)    GERD (gastroesophageal reflux disease)    History of hiatal hernia    Hypercholesterolemia    Hypertension    Prostate cancer ([place]) 2000   Prostatectomy.    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[place]) 09/2018   Chemo tx's.    Wears dentures    partial lower    Past Surgical History:  Procedure Laterality Date   CATARACT EXTRACTION W/PHACO Right 10/12/2019   Procedure: CATARACT EXTRACTION PHACO AND INTRAOCULAR LENS PLACEMENT (IOC) RIGHT DIABETIC 6.35 00:37.1;  Surgeon: Birder Robson, MD;  Location: [place];  Service: Ophthalmology;  Laterality: Right;   COLONOSCOPY N/A 05/02/2021   Procedure: COLONOSCOPY;  Surgeon: [name], [name], MD;  Location: [facility] ENDOSCOPY;  Service: Gastroenterology;  Laterality: N/A;  IDDM   COLONOSCOPY WITH PROPOFOL N/A 07/15/2018   Procedure: COLONOSCOPY WITH PROPOFOL;  Surgeon: [name], [name], MD;  Location: [facility] ENDOSCOPY;  Service: Gastroenterology;  Laterality: N/A;   ESOPHAGOGASTRODUODENOSCOPY N/A 10/12/2018   Procedure: ESOPHAGOGASTRODUODENOSCOPY (EGD);  Surgeon: [name], MD;  Location: [facility] ENDOSCOPY;  Service: Gastroenterology;  Laterality: N/A;   ESOPHAGOGASTRODUODENOSCOPY (EGD)  WITH PROPOFOL N/A 07/15/2018   Procedure: ESOPHAGOGASTRODUODENOSCOPY (EGD) WITH PROPOFOL;  Surgeon: [name], [name], MD;  Location: [facility] ENDOSCOPY;  Service: Gastroenterology;  Laterality: N/A;   PORTACATH PLACEMENT N/A 08/13/2018   Procedure: INSERTION PORT-A-CATH;  Surgeon: [name], MD;  Location: [facility] ORS;  Service: General;  Laterality:  N/A;   PROSTATE SURGERY     ROBOT ASSISTED LAPAROSCOPIC PARTIAL COLECTOMY  05/12/2019   [place]    Medications Prior to Admission  Medication Sig Dispense Refill Last Dose   aspirin EC 81 MG tablet Take 81 mg by mouth daily.   09/21/2021 at Unknown   brimonidine (ALPHAGAN) 0.2 % ophthalmic solution Place 1 drop into both eyes 2 (two) times daily.   Unknown at Unknown   lisinopril (ZESTRIL) 10 MG tablet Take 10 mg by mouth daily.   09/21/2021 at Unknown   metoprolol succinate (TOPROL-XL) 50 MG 24 hr tablet Take 50 mg by mouth daily.   09/21/2021 at Unknown   Netarsudil-Latanoprost 0.02-0.005 % SOLN Place 1 drop into both eyes at bedtime.   Past Week at Unknown   NOVOLOG MIX 70/30 FLEXPEN (70-30) 100 UNIT/ML FlexPen Inject 12 Units into the skin 2 (two) times daily with a meal.   Past Week at Unknown   pantoprazole (PROTONIX) 40 MG tablet Take 1 tablet (40 mg total) by mouth 2 (two) times daily before a meal. (Patient taking differently: Take 40 mg by mouth daily.) 60 tablet 0 09/21/2021 at Unknown   rosuvastatin (CRESTOR) 10 MG tablet Take 10 mg by mouth daily.   09/21/2021 at Unknown   traMADol (ULTRAM) 50 MG tablet Take 1 tablet (50 mg total) by mouth every 6 (six) hours as needed. 12 tablet 0 Unknown at PRN   traZODone (DESYREL) 50 MG tablet Take 50 mg by mouth at bedtime as needed for sleep.    Unknown at PRN   Social History   Socioeconomic History   Marital status: Widowed    Spouse name: Not on file   Number of children: 0   Years of education: Not on file   Highest education level: Not on file  Occupational History   Occupation: Pharmacist, hospital    Comment: Retired  Tobacco Use   Smoking status: Never   Smokeless tobacco: Never  Vaping Use   Vaping Use: Never used  Substance and Sexual Activity   Alcohol use: Not Currently   Drug use: Never   Sexual activity: Not Currently  Other Topics Concern   Not on file  Social History Narrative   Patient is retired Cytogeneticist.  He was widowed approximately 1 year ago (2019).  His wife was a Camera operator and they traveled frequently.  Several nieces and nephews.  He is the youngest of his brothers and sisters and only surviving.   Social Determinants of Health   Financial Resource Strain: Low Risk  (11/01/2017)   Overall Financial Resource Strain (CARDIA)    Difficulty of Paying Living Expenses: Not hard at all  Food Insecurity: No Food Insecurity (11/01/2017)   Hunger Vital Sign    Worried About Running Out of Food in the Last Year: Never true    Ran Out of Food in the Last Year: Never true  Transportation Needs: No Transportation Needs (11/01/2017)   PRAPARE - Hydrologist (Medical): No    Lack of Transportation (Non-Medical): No  Physical Activity: Insufficiently Active (11/01/2017)   Exercise Vital Sign  Days of Exercise per Week: 2 days    Minutes of Exercise per Session: 30 min  Stress: No Stress Concern Present (11/01/2017)   [place]    Feeling of Stress : Only a little  Social Connections: Unknown (08/31/2018)   Social Connection and Isolation Panel [NHANES]    Frequency of Communication with Friends and Family: More than three times a week    Frequency of Social Gatherings with Friends and Family: More than three times a week    Attends Religious Services: Not on file    Active Member of Clubs or Organizations: Not on file    Attends Archivist Meetings: Not on file    Marital Status: Not on file  Intimate Partner Violence: Not At Risk (08/31/2018)   Humiliation, Afraid, Rape, and Kick questionnaire    Fear of Current or Ex-Partner: No    Emotionally Abused: No    Physically Abused: No    Sexually Abused: No    Family History  Problem Relation Age of Onset   Diabetes Mother    Ovarian cancer Sister       PHYSICAL EXAM General: Pleasant and  conversational black male, well nourished, in no acute distress. HEENT:  Normocephalic and atraumatic.  NG tube left nare Neck:  No JVD.  Lungs: Normal respiratory effort on room air. Clear bilaterally to auscultation. No wheezes, crackles, rhonchi.  Heart: HRRR with frequent ectopy. Normal S1 and S2 without gallops or murmurs. Radial & DP pulses 2+ bilaterally. Abdomen: Distended appearing with colostomy present Msk: Normal strength and tone for age. Extremities: Warm and well perfused. No clubbing, cyanosis.  No peripheral edema.  Neuro: Alert and oriented X 3. Psych:  Answers questions appropriately.   Labs:   Lab Results  Component Value Date   WBC 5.8 09/23/2021   HGB 15.5 09/23/2021   HCT 46.5 09/23/2021   MCV 86.8 09/23/2021   PLT 216 09/23/2021    Recent Labs  Lab 09/23/21 0422 09/24/21 0459  NA 135 139  K 4.1 3.8  CL 103 109  CO2 21* 19*  BUN 26* 21  CREATININE 1.45* 1.36*  CALCIUM 9.6 8.5*  PROT 8.3*  --   BILITOT 0.8  --   ALKPHOS 81  --   ALT 19  --   AST 21  --   GLUCOSE 208* 145*   Lab Results  Component Value Date   CKTOTAL 287 10/31/2017   TROPONINI 0.05 (HH) 10/31/2017   No results found for: "CHOL" No results found for: "HDL" No results found for: "LDLCALC" No results found for: "TRIG" No results found for: "CHOLHDL" No results found for: "LDLDIRECT"    Radiology: DG Abd 1 View  Result Date: 09/23/2021 CLINICAL DATA:  Enteric tube placement EXAM: ABDOMEN - 1 VIEW COMPARISON:  10/16/2018 abdominal radiograph FINDINGS: Enteric tube terminates in the gastric fundus. No dilated small bowel loops. No evidence of pneumatosis or pneumoperitoneum in the upper abdomen. Clear lung bases. Mild lumbar spondylosis. IMPRESSION: Enteric tube terminates in the gastric fundus. Electronically Signed   By: Ilona Sorrel M.D.   On: 09/23/2021 11:22   CT Abdomen Pelvis W Contrast  Result Date: 09/23/2021 CLINICAL DATA:  Nausea, vomiting and abdominal pain with  decreased stool output from colostomy. EXAM: CT ABDOMEN AND PELVIS WITH CONTRAST TECHNIQUE: Multidetector CT imaging of the abdomen and pelvis was performed using the standard protocol following bolus administration of intravenous contrast. RADIATION DOSE REDUCTION: This  exam was performed according to the departmental dose-optimization program which includes automated exposure control, adjustment of the mA and/or kV according to patient size and/or use of iterative reconstruction technique. CONTRAST:  174m OMNIPAQUE IOHEXOL 300 MG/ML  SOLN COMPARISON:  Study with IV contrast 08/01/2021, study with IV and oral contrast 05/17/2021. FINDINGS: Lower chest: No acute abnormality. The cardiac size is normal. There is a stent in the distal LAD coronary artery. Small hiatal hernia with mildly thickened distal thoracic esophagus. Hepatobiliary: The liver mildly steatotic. There is no mass enhancement. Stable tiny hypodensity in the dome of the left hepatic lobe on 2:12 probable tiny cyst. Otherwise unremarkable liver. Unremarkable gallbladder and bile ducts. Pancreas: Partially atrophic and otherwise unremarkable. Spleen: Small wedge-shaped hypodensity in the subcapsular posterior spleen most likely a small infarct and was seen previously. Otherwise unremarkable spleen. Adrenals/Urinary Tract: No adrenal mass. 2.2 cm cyst inferior pole right kidney. Stable chronic perinephric stranding and trace perinephric fluid. There is no renal mass calculus or urinary obstruction. The bladder is contracted and not well seen. Stomach/Bowel: Gastric fluid distention is again noted. The appendix is normal. Distal colectomy again seen and appears to have been either at the junction of the transverse and descending colon or in the proximal descending colon. Residual left colonic output through a left mid abdominal colostomy. There are diffuse diverticula without evidence of diverticulitis. Most of the small bowel is normal caliber but there  are fluid-filled dilated segments in the pelvis with extension into the left iliac fossa, which again show mucosal enhancement. This was seen on 08/01/2021 but these segments were normal caliber on 05/17/2021. Given persistence of the findings, with a few segments dilated up to 3.4 cm, this could represent a low-grade partial small bowel obstruction possibly related to adhesions from the prior surgery. The wall enhancement in the segments may relate to prior XRT to the area or other ongoing etiology for nonspecific enteritis. There are no inflammatory changes associated with this but there is a small volume of pelvic ascites which was not seen previously noted above bladder and in the posterior deep pelvis. Vascular/Lymphatic: Aortic atherosclerosis. No enlarged abdominal or pelvic lymph nodes. Reproductive: Surgically absent prostate. Other: Presacral soft tissue thickening continues to be noted but is not significantly changed. There are small umbilical and inguinal fat hernias. There is no free air, abscess or free hemorrhage. Musculoskeletal: There are degenerative changes of the spine and hip joints. No aggressive or destructive bone lesions. Bridging osteophytes both anterior SI joints. IMPRESSION: 1. There is continued dilatation of pelvic small bowel segments up to 3.4 cm with wall enhancement, while the remainder of the small bowel remains normal caliber. Lack of interval change in this finding suggests a probable low-grade partial small bowel obstruction related to the prior surgery with adhesions, with the wall enhancement nonspecific and could be from prior XRT or ongoing enteritis of other etiology. There is no bowel pneumatosis, abscess or free air, or acute inflammatory changes. 2. Prior distal colectomy with left abdominal colostomy, diverticula along the residual large bowel but no evidence of diverticulitis. 3. Aortic atherosclerosis. 4. Small amount of scattered pelvic ascites not seen previously.  No localizing collection. 5. Umbilical and inguinal fat hernias. Electronically Signed   By: KTelford NabM.D.   On: 09/23/2021 06:04   DG Chest Port 1 View  Result Date: 09/23/2021 CLINICAL DATA:  81year old male with vomiting and abdominal pain. History of rectal cancer. EXAM: PORTABLE CHEST 1 VIEW COMPARISON:  Chest radiograph 09/19/2021  and earlier. FINDINGS: Portable AP semi upright view at 0437 hours. Lung volumes and mediastinal contours remain within normal limits. Visualized tracheal air column is within normal limits. Allowing for portable technique the lungs are clear. No pneumothorax or pneumoperitoneum. Paucity of bowel gas in the upper abdomen. No acute osseous abnormality identified. IMPRESSION: Negative portable chest. Electronically Signed   By: Genevie Ann M.D.   On: 09/23/2021 05:10   DG Chest 2 View  Result Date: 09/19/2021 CLINICAL DATA:  Chest pain EXAM: CHEST - 2 VIEW COMPARISON:  05/22/2021 FINDINGS: The heart size and mediastinal contours are within normal limits. Both lungs are clear. The visualized skeletal structures are unremarkable. IMPRESSION: No active cardiopulmonary disease. Electronically Signed   By: Davina Poke D.O.   On: 09/19/2021 09:00    ECHO 05/31/2020 ECHOCARDIOGRAPHIC MEASUREMENTS  2D DIMENSIONS  AORTA                  Values   Normal Range   MAIN PA         Values    Normal Range                Annulus: 1.8 cm       [2.3-2.9]         PA Main: nm*       [1.5-2.1]              Aorta Sin: 2.8 cm       [3.1-3.7]    RIGHT VENTRICLE            ST Junction: nm*          [2.6-3.2]         RV Base: nm*       [<4.2]              Asc.Aorta: nm*          [2.6-3.4]          RV Mid: 2.7 cm    [<3.5]  LEFT VENTRICLE                                      RV Length: nm*       [<8.6]                  LVIDd: 4.8 cm       [4.2-5.9]    INFERIOR VENA CAVA                  LVIDs: 3.6 cm                        Max. IVC: nm*       [<=2.1]                     FS: 24.5 %        [>25]            Min. IVC: nm*                    SWT: 1.0 cm       [0.6-1.0]    ------------------                    PWT: 0.91 cm      [0.6-1.0]    nm* - not measured  LEFT ATRIUM  LA Diam: 3.7 cm       [3.0-4.0]            LA A4C Area: nm*          [<20]              LA Volume: nm*          [18-58]  _________________________________________________________________________________________  ECHOCARDIOGRAPHIC DESCRIPTIONS  AORTIC ROOT                   Size: Normal             Dissection: INDETERM FOR DISSECTION  AORTIC VALVE               Leaflets: Tricuspid                   Morphology: MILDLY THICKENED               Mobility: Fully mobile  LEFT VENTRICLE                   Size: Normal                        Anterior: Normal            Contraction: Normal                         Lateral: Normal             Closest EF: 50% (Estimated)                 Septal: Normal              LV Masses: No Masses                       Apical: Normal                    LVH: None                          Inferior: Normal                                                      Posterior: Normal           Dias.FxClass: N/A  MITRAL VALVE               Leaflets: Normal                        Mobility: Fully mobile             Morphology: THICKENED LEAFLET(S)  LEFT ATRIUM                   Size: Normal                       LA Masses: No masses              IA Septum: Normal IAS  MAIN PA                   Size: Normal  PULMONIC VALVE  Morphology: Normal                        Mobility: Fully mobile  RIGHT VENTRICLE              RV Masses: No Masses                         Size: Normal              Free Wall: Normal                     Contraction: Normal  TRICUSPID VALVE               Leaflets: Normal                        Mobility: Fully mobile             Morphology: Normal  RIGHT ATRIUM                   Size: Normal                        RA Other: None                 RA Mass: No masses  PERICARDIUM                  Fluid: No effusion  INFERIOR VENACAVA                   Size: Normal Normal respiratory collapse  _________________________________________________________________________________________   DOPPLER ECHO and OTHER SPECIAL PROCEDURES                 Aortic: No AR                      No AS                         91.9 cm/sec peak vel       3.4 mmHg peak grad                 Mitral: MILD MR                    No MS                         MV Inflow E Vel = 106.0 cm/sec      MV Annulus E'Vel = 9.4 cm/sec                         E/E'Ratio = 11.3              Tricuspid: MILD TR                    No TS                         255.6 cm/sec peak TR vel   29.1 mmHg peak RV pressure              Pulmonary: No PR                      No PS  61.1 cm/sec peak vel       1.5 mmHg peak grad  _________________________________________________________________________________________  INTERPRETATION  NORMAL LEFT VENTRICULAR SYSTOLIC FUNCTION  NORMAL RIGHT VENTRICULAR SYSTOLIC FUNCTION  MILD VALVULAR REGURGITATION (See above)  NO VALVULAR STENOSIS  MILD MR, TR  EF 50%   TELEMETRY reviewed by me: Sinus tachycardia with ventricular bigeminy and trigeminy, rate in the low 100s to 110s with a 5-minute paroxysm of heart rate up to 150 this morning.  EKG reviewed by me: EKG on 7/12 showed sinus rhythm with frequent premature atrial contractions.  EKG on admission on 7/16 showed sinus tachycardia with ventricular trigeminy and a rate of 107 without acute ischemic changes.  ASSESSMENT AND PLAN:  Willie [name] is an 70yoM with a PMH of CAD s/p PCI and stent placement in the 90s, hypertension, hypercholesterolemia, type 2 diabetes, IDA, colorectal cancer s/p chemo and radiation with subsequent colostomy placed in 2020 who presented to [facility] ED 09/23/2021 with abdominal pain.  Cardiology is consulted for assistance with his tachycardia.  #Sinus  tachycardia #Ventricular bigeminy/trigeminy #SBO, history of colorectal CA s/p chemo, radiation and colostomy Presented with abdominal pain and decreased colostomy output x2 days prior to admission and imaging concerning for at least a partial SBO.  His abdominal pain has improved with NG tube decompression and he has no complaints of chest discomfort or palpitations.  Notably, his home antihypertensives including his beta-blocker were not restarted on admission because he was n.p.o. and his heart rate was elevated as a result with his abdominal pain likely contributing too.  -Agree with current therapy for treatment of his SBO per primary team -Continuous monitoring on telemetry -Restart home metoprolol XL 50 mg p.o. if he is able to have sips with meds, can uptitrate dosing if needed -Continue lisinopril 10 mg daily -No further cardiac diagnostics recommended at this time, has a follow-up appointment with Gladstone Pih, NP-C at [facility] on 7/31  This patient's plan of care was discussed and created with Dr. Clayborn Bigness and he is in agreement.  Signed: Tristan Schroeder , PA-C 09/24/2021, 11:28 AM [facility]

## 2021-09-24 NOTE — Progress Notes (Signed)
   09/24/21 0808  Assess: MEWS Score  Temp 98.7 F (37.1 C)  BP (!) 136/120  MAP (mmHg) 127  Pulse Rate (!) 123  ECG Heart Rate (!) 125  Resp 18  Level of Consciousness Alert  SpO2 98 %  O2 Device Room Air  Assess: MEWS Score  MEWS Temp 0  MEWS Systolic 0  MEWS Pulse 2  MEWS RR 0  MEWS LOC 0  MEWS Score 2  MEWS Score Color Yellow  Assess: if the MEWS score is Yellow or Red  Were vital signs taken at a resting state? Yes  Focused Assessment No change from prior assessment  Does the patient meet 2 or more of the SIRS criteria? No  Does the patient have a confirmed or suspected source of infection? No  MEWS guidelines implemented *See Row Information* Yes  Treat  Pain Scale 0-10  Pain Score 0  Take Vital Signs  Increase Vital Sign Frequency  Yellow: Q 2hr X 2 then Q 4hr X 2, if remains yellow, continue Q 4hrs  Escalate  MEWS: Escalate Yellow: discuss with charge nurse/RN and consider discussing with provider and RRT  Notify: Charge Nurse/RN  Name of Charge Nurse/RN Notified Jessi, RN  Date Charge Nurse/RN Notified 09/24/21  Time Charge Nurse/RN Notified 1043  Notify: Provider  Provider Name/Title Dr. Arbutus Ped  Date Provider Notified 09/24/21  Time Provider Notified 1044  Method of Notification Page  Notification Reason Other (Comment)  Provider response See new orders (cardiology consult)  Date of Provider Response 09/24/21  Time of Provider Response 1053  Document  Patient Outcome Other (Comment) (continue to monitor)  Progress note created (see row info) Yes  Assess: SIRS CRITERIA  SIRS Temperature  0  SIRS Pulse 1  SIRS Respirations  0  SIRS WBC 1  SIRS Score Sum  2

## 2021-09-24 NOTE — TOC Initial Note (Signed)
Transition of Care [facility]) - Initial/Assessment Note    Patient Details  Name: Willie [name] MRN: [id number] Date of Birth: November 15, 1940  Transition of Care [facility]) CM/SW Contact:    Beverly Sessions, RN Phone Number: 09/24/2021, 3:53 PM  Clinical Narrative:                   Transition of Care [facility]) Screening Note   Patient Details  Name: Willie [name] Date of Birth: 25-Oct-1940   Transition of Care [facility]) CM/SW Contact:    Beverly Sessions, RN Phone Number: 09/24/2021, 3:53 PM    Transition of Care Department [facility]) has reviewed patient and no TOC needs have been identified at this time. We will continue to monitor patient advancement through interdisciplinary progression rounds. If new patient transition needs arise, please place a TOC consult.  Per MD Pt eval not indicated at this time       Patient Goals and CMS Choice        Expected Discharge Plan and Services                                                Prior Living Arrangements/Services                       Activities of Daily Living Home Assistive Devices/Equipment: None ADL Screening (condition at time of admission) Patient's cognitive ability adequate to safely complete daily activities?: Yes Is the patient deaf or have difficulty hearing?: No Does the patient have difficulty seeing, even when wearing glasses/contacts?: No Does the patient have difficulty concentrating, remembering, or making decisions?: No Patient able to express need for assistance with ADLs?: Yes Does the patient have difficulty dressing or bathing?: No Independently performs ADLs?: Yes (appropriate for developmental age) Does the patient have difficulty walking or climbing stairs?: No Weakness of Legs: None Weakness of Arms/Hands: None  Permission Sought/Granted                  Emotional Assessment              Admission diagnosis:  Dehydration [E86.0] SBO (small bowel obstruction) ([place])  [K56.609] Generalized abdominal pain [R10.84] AKI (acute kidney injury) ([place]) [N17.9] Nausea and vomiting, unspecified vomiting type [R11.2] Patient Active Problem List   Diagnosis Date Noted   SBO (small bowel obstruction) ([place]) 09/23/2021   CAD (coronary artery disease) 09/23/2021   Type II diabetes mellitus with renal manifestations ([place]) 09/23/2021   Chronic kidney disease, stage 3a ([place]) 08/01/2021   Red blood cell antibody positive, 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 05/20/2019   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[place]) 05/13/2019   Chemotherapy induced neutropenia ([place]) 03/11/2019   Hypokalemia 10/27/2018   Protein-calorie malnutrition, severe 10/12/2018   Sepsis ([place]) 10/10/2018   Hypercholesteremia 08/17/2018   Goals of care, counseling/discussion 08/05/2018   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[place]) 08/05/2018   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   History of gastric ulcer 07/07/2018   Ventricular tachycardia ([place]) 10/31/2017   Benign prostatic hyperplasia 09/16/2017   Controlled type 2 diabetes mellitus without complication, with long-term current use of insulin ([place]) 09/16/2017   Coronary artery disease involving native heart without angina pectoris 09/16/2017   Essential hypertension 09/16/2017   Onychomycosis due to dermatophyte 05/28/2013   PCP:  [name], MD Pharmacy:  [address], [place]  [id number] Phone: [phone] Fax: [phone]     Social Determinants of Health (SDOH) Interventions    Readmission Risk Interventions     No data to display

## 2021-09-24 NOTE — Progress Notes (Signed)
Progress Note   Patient: Willie [name]:[id number] DOB: Dec 27, 1940 DOA: 09/23/2021     1 DOS: the patient was seen and examined on 09/24/2021   Brief hospital course: 81 y.o. male with medical history significant of rectal cancer (s/p for colectomy, colostomy, chemotherapy and radiation therapy), hypertension, hyperlipidemia, diabetes mellitus, gastric ulcer, CAD, V. tach, small bowel obstruction, who presented to the ED on 09/23/2021 for evaluation of  nausea, vomiting, abdominal pain x2 days.  Last BM was 2 days prior.  Evaluation in the ED revealed signs of low-grade partial small bowel obstruction on CT abdomen pelvis felt related to prior surgery and adhesions.  General surgery was consulted.  NG tube placed for decompression.  Patient started on IV fluids and kept n.p.o. while monitoring for improvement.  Assessment and Plan: * SBO (small bowel obstruction) (HCC) CT scan in the ED showed low-grade small bowel obstruction.   --Management per general surgery --Serial KUBs, possible Gastrografin challenge --NG tube in place to wall suction --Monitor NG output --N.p.o. except ice chips, clamp tube briefly per p.o. beta-blocker --IV fluids -- Antiemetics and pain control as needed -- Mobilize as tolerated  Type II diabetes mellitus with renal manifestations (HCC) Recent A1c 8.0, poorly controlled.   Home regimen is 70/30 insulin 12 unit twice daily. -Sliding scale insulin -70/30 insulin 6 unit bid -Adjust insulin for goal 140-180  Ventricular tachycardia (HCC) Heart rate 116, 98 on admission 7/17 --ongoing tachycardia and trigeminy on telemetry -- Cardiology consulted, patient resumed on beta-blocker  Essential hypertension -IV hydralazine as needed -Oral antihypertensives held due to n.p.o. status -Metoprolol resumed 7/17 due to tachycardia and trigeminy  Hypercholesteremia -hold Crestor while NPO  Chronic kidney disease, stage 3a (HCC) Renal function near baseline on  admission at 1.48 with recent baseline creatinine 1.2-1.4.  -- Monitor BMP -- Avoid nephrotoxins and renally dose meds as indicated  CAD (coronary artery disease) No chest pain. Aspirin statin and beta-blocker on hold while NPO.   Beta-blocker resumed 7/17 due to tachycardia and trigeminy.  Rectal cancer [name]) S/p of colectomy, radiation and chemotherapy. -S/p of colostomy -Follow-up with oncology        Subjective: Patient seen with 2 family members at bedside on rounds today.  He reports feeling better since the NG tube was placed.  Reports less output from the NG tube tube today compared to yesterday.  States his colostomy needs to be changed.  Denies significant pain or nausea at this time.  No other acute complaints at this time.  Physical Exam: Vitals:   09/24/21 1008 09/24/21 1219 09/24/21 1311 09/24/21 1607  BP: 130/78 (!) 144/54  (!) 162/64  Pulse: 100 (!) 34 82 (!) 30  Resp: '16 18  16  '$ Temp: 98.2 F (36.8 C) 98.9 F (37.2 C)  98.1 F (36.7 C)  TempSrc:  Oral  Oral  SpO2: 99% 98%  99%  Weight:      Height:       General exam: awake, alert, no acute distress HEENT: NG tube in left nare connected to wall suction, moist mucus membranes, hearing grossly normal  Respiratory system: CTAB, no wheezes, rales or rhonchi, normal respiratory effort. Cardiovascular system: normal S1/S2, tachycardic, regular rhythm, no pedal edema.   Gastrointestinal system: Mildly distended, nontender, unable to appreciate bowel sounds, colostomy present Central nervous system: A&O x4. no gross focal neurologic deficits, normal speech Extremities: moves all , no edema, normal tone Skin: dry, intact, normal temperature Psychiatry: normal mood, congruent affect, judgement and  insight appear normal   Data Reviewed:  Notable labs: Bicarb 19, glucose 145, creatinine 1.36 improved, calcium 8.5, GFR 52  Family Communication: 2 family members at bedside on rounds today  Disposition: Status  is: Inpatient Remains inpatient appropriate because: Persistent SBO, remains with NG tube in place and on IV fluids n.p.o.   Planned Discharge Destination: Home    Time spent: 40 minutes  Author: Ezekiel Slocumb, DO 09/24/2021 5:42 PM  For on call review [email or web address].

## 2021-09-24 NOTE — Hospital Course (Signed)
81 y.o. male with medical history significant of rectal cancer (s/p for colectomy, colostomy, chemotherapy and radiation therapy), hypertension, hyperlipidemia, diabetes mellitus, gastric ulcer, CAD, V. tach, small bowel obstruction, who presented to the ED on 09/23/2021 for evaluation of  nausea, vomiting, abdominal pain x2 days.  Last BM was 2 days prior.  Evaluation in the ED revealed signs of low-grade partial small bowel obstruction on CT abdomen pelvis felt related to prior surgery and adhesions.  General surgery was consulted.  NG tube placed for decompression.  Patient started on IV fluids and kept n.p.o. while monitoring for improvement.

## 2021-09-25 ENCOUNTER — Inpatient Hospital Stay: Payer: Medicare PPO

## 2021-09-25 DIAGNOSIS — K56609 Unspecified intestinal obstruction, unspecified as to partial versus complete obstruction: Secondary | ICD-10-CM | POA: Diagnosis not present

## 2021-09-25 LAB — GLUCOSE, CAPILLARY
Glucose-Capillary: 147 mg/dL — ABNORMAL HIGH (ref 70–99)
Glucose-Capillary: 113 mg/dL — ABNORMAL HIGH (ref 70–99)
Glucose-Capillary: 71 mg/dL (ref 70–99)
Glucose-Capillary: 88 mg/dL (ref 70–99)
Glucose-Capillary: 202 mg/dL — ABNORMAL HIGH (ref 70–99)
Glucose-Capillary: 134 mg/dL — ABNORMAL HIGH (ref 70–99)

## 2021-09-25 LAB — BASIC METABOLIC PANEL
Anion gap: 9 (ref 5–15)
BUN: 18 mg/dL (ref 8–23)
CO2: 21 mmol/L — ABNORMAL LOW (ref 22–32)
Calcium: 8.5 mg/dL — ABNORMAL LOW (ref 8.9–10.3)
Chloride: 111 mmol/L (ref 98–111)
Creatinine, Ser: 1.21 mg/dL (ref 0.61–1.24)
GFR, Estimated: 59 mL/min — ABNORMAL LOW (ref >60)
Glucose, Bld: 132 mg/dL — ABNORMAL HIGH (ref 70–99)
Potassium: 3.5 mmol/L (ref 3.5–5.1)
Sodium: 141 mmol/L (ref 135–145)

## 2021-09-25 LAB — MAGNESIUM: Magnesium: 2.2 mg/dL (ref 1.7–2.4)

## 2021-09-25 MED ORDER — PANTOPRAZOLE SODIUM 40 MG PO TBEC
40.0000 mg | DELAYED_RELEASE_TABLET | Freq: Every day | ORAL | Status: DC
  Administered 2021-09-25 – 2021-09-27 (×3): 40 mg via ORAL
  Filled 2021-09-25 (×3): qty 1

## 2021-09-25 MED ORDER — ROSUVASTATIN CALCIUM 10 MG PO TABS
10.0000 mg | ORAL_TABLET | Freq: Every day | ORAL | Status: DC
  Administered 2021-09-26 – 2021-09-27 (×2): 10 mg via ORAL
  Filled 2021-09-25 (×2): qty 1

## 2021-09-25 MED ORDER — ENOXAPARIN SODIUM 40 MG/0.4ML IJ SOSY
40.0000 mg | PREFILLED_SYRINGE | INTRAMUSCULAR | Status: DC
Start: 2021-09-25 — End: 2021-09-27
  Administered 2021-09-25 – 2021-09-26 (×2): 40 mg via SUBCUTANEOUS
  Filled 2021-09-25 (×2): qty 0.4

## 2021-09-25 MED ORDER — MENTHOL 3 MG MT LOZG
1.0000 | LOZENGE | OROMUCOSAL | Status: DC | PRN
  Filled 2021-09-25: qty 9

## 2021-09-25 MED ORDER — LISINOPRIL 10 MG PO TABS
10.0000 mg | ORAL_TABLET | Freq: Every day | ORAL | Status: DC
  Administered 2021-09-25 – 2021-09-27 (×3): 10 mg via ORAL
  Filled 2021-09-25 (×3): qty 1

## 2021-09-25 NOTE — Progress Notes (Signed)
[facility] Cardiology    SUBJECTIVE: Patient states he feels somewhat better after decompression NG suction now tolerating sips of ice denies any palpitations or tachycardia no chest pain   Vitals:   09/24/21 1607 09/24/21 1945 09/25/21 0712 09/25/21 0734  BP: (!) 162/64 (!) 140/54 (!) 192/83 (!) 157/67  Pulse: (!) 30 82 81 67  Resp: '16 16 16 18  '$ Temp: 98.1 F (36.7 C) 98.4 F (36.9 C) 98.4 F (36.9 C) 98.2 F (36.8 C)  TempSrc: Oral Oral    SpO2: 99% 96% 98% 100%  Weight:      Height:         Intake/Output Summary (Last 24 hours) at 09/25/2021 1255 Last data filed at 09/24/2021 2223 Gross per 24 hour  Intake 794.27 ml  Output 200 ml  Net 594.27 ml      PHYSICAL EXAM  General: Well developed, well nourished, in no acute distress HEENT:  Normocephalic and atramatic Neck:  No JVD.  Lungs: Clear bilaterally to auscultation and percussion. Heart: HRRR . Normal S1 and S2 without gallops or murmurs.  Abdomen: Bowel sounds are positive, abdomen soft and non-tender  Msk:  Back normal, normal gait. Normal strength and tone for age. Extremities: No clubbing, cyanosis or edema.   Neuro: Alert and oriented X 3. Psych:  Good affect, responds appropriately   LABS: Basic Metabolic Panel: Recent Labs    09/24/21 0459 09/25/21 0514  NA 139 141  K 3.8 3.5  CL 109 111  CO2 19* 21*  GLUCOSE 145* 132*  BUN 21 18  CREATININE 1.36* 1.21  CALCIUM 8.5* 8.5*  MG 2.2 2.2   Liver Function Tests: Recent Labs    09/23/21 0422  AST 21  ALT 19  ALKPHOS 81  BILITOT 0.8  PROT 8.3*  ALBUMIN 4.1   Recent Labs    09/23/21 0422  LIPASE 21   CBC: Recent Labs    09/23/21 0422  WBC 5.8  NEUTROABS 3.6  HGB 15.5  HCT 46.5  MCV 86.8  PLT 216   Cardiac Enzymes: No results for input(s): "CKTOTAL", "CKMB", "CKMBINDEX", "TROPONINI" in the last 72 hours. BNP: Invalid input(s): "POCBNP" D-Dimer: No results for input(s): "DDIMER" in the last 72 hours. Hemoglobin A1C: No results  for input(s): "HGBA1C" in the last 72 hours. Fasting Lipid Panel: No results for input(s): "CHOL", "HDL", "LDLCALC", "TRIG", "CHOLHDL", "LDLDIRECT" in the last 72 hours. Thyroid Function Tests: No results for input(s): "TSH", "T4TOTAL", "T3FREE", "THYROIDAB" in the last 72 hours.  Invalid input(s): "FREET3" Anemia Panel: No results for input(s): "VITAMINB12", "FOLATE", "FERRITIN", "TIBC", "IRON", "RETICCTPCT" in the last 72 hours.  DG Abd 2 Views  Result Date: 09/25/2021 CLINICAL DATA:  Small bowel obstruction. Nasogastric tube improving symptoms. History of rectal cancer. EXAM: ABDOMEN - 2 VIEW COMPARISON:  09/23/2021 FINDINGS: Nasogastric tube tip overlies the level of the stomach. Bowel gas pattern is nonobstructed. No evidence for free intraperitoneal air. LEFT LOWER QUADRANT ostomy. Surgical clips in the region of the central pelvis. IMPRESSION: Nasogastric tube tip to level of the stomach. Electronically Signed   By: Nolon Nations M.D.   On: 09/25/2021 08:26     Echo   TELEMETRY: NSR :  ASSESSMENT AND PLAN:  Principal Problem:   SBO (small bowel obstruction) (HCC) Active Problems:   Ventricular tachycardia (HCC)   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HCC)   Essential hypertension   Hypercholesteremia   Chronic kidney disease, stage 3a (HCC)   CAD (coronary artery disease)   Type II diabetes  mellitus with renal manifestations ([place])     Plan SBO reasonably stable getting conservative therapy with intermittent suction continue decompression management HTN resume metoprolol for tachycardia and blood pressure control CRI continue adequate hydration consider follow-up with nephrology CAD previous PCI and stent no recent angina continue conservative management DM maintain diabetes management and control Rectal Ca status post colostomy after surgery reasonably stable Colostomy maintain chronic therapy colostomy management Conservative cardiac involvement at this stage    Yolonda Kida,  MD 09/25/2021 12:55 PM

## 2021-09-25 NOTE — Progress Notes (Signed)
PHARMACIST - PHYSICIAN COMMUNICATION  CONCERNING: IV to Oral Route Change Policy  RECOMMENDATION: This patient is receiving pantoprazole by the intravenous route.  Based on criteria approved by the Pharmacy and Therapeutics Committee, the intravenous medication(s) is/are being converted to the equivalent oral dose form(s).   DESCRIPTION: These criteria include: The patient is eating (either orally or via tube) and/or has been taking other orally administered medications for a least 24 hours The patient has no evidence of active gastrointestinal bleeding or impaired GI absorption (gastrectomy, short bowel, patient on TNA or NPO).  If you have questions about this conversion, please contact the [place], [facility] 09/25/2021 8:44 AM

## 2021-09-25 NOTE — Progress Notes (Signed)
Progress Note   Patient: Willie [name]:[id number] DOB: June 23, 1940 DOA: 09/23/2021     2 DOS: the patient was seen and examined on 09/25/2021   Brief hospital course: 81 y.o. male with medical history significant of rectal cancer (s/p for colectomy, colostomy, chemotherapy and radiation therapy), hypertension, hyperlipidemia, diabetes mellitus, gastric ulcer, CAD, V. tach, small bowel obstruction, who presented to the ED on 09/23/2021 for evaluation of  nausea, vomiting, abdominal pain x2 days.  Last BM was 2 days prior.  Evaluation in the ED revealed signs of low-grade partial small bowel obstruction on CT abdomen pelvis felt related to prior surgery and adhesions.  General surgery was consulted.  NG tube placed for decompression.  Patient started on IV fluids and kept n.p.o. while monitoring for improvement.  Assessment and Plan: * SBO (small bowel obstruction) (HCC) CT scan in the ED showed low-grade small bowel obstruction.   --Management per general surgery --Serial KUBs --NG tube clamped this morning --On clear liquids, diet advancement per surgery --IV fluids until adequate p.o. intake -- Antiemetics and pain control as needed -- Mobilize as tolerated  Type II diabetes mellitus with renal manifestations (HCC) Recent A1c 8.0, poorly controlled.   Home regimen is 70/30 insulin 12 unit twice daily. -Sliding scale insulin -70/30 insulin 6 unit bid -Adjust insulin for goal 140-180  Ventricular tachycardia (HCC) Heart rate 116, 98 on admission 7/17 --ongoing tachycardia and trigeminy on telemetry -- Cardiology consulted, patient resumed on beta-blocker  Essential hypertension -IV hydralazine as needed -Continue metoprolol -Resume lisinopril -Adjust antihypertensives as needed  Hypercholesteremia Resume statin  Chronic kidney disease, stage 3a ([place]) Renal function near baseline on admission at 1.48 with recent baseline creatinine 1.2-1.4.  Creatinine today: 1.21 --  Monitor BMP -- Avoid nephrotoxins and renally dose meds as indicated  CAD (coronary artery disease) No chest pain. Continue aspirin statin and beta-blocker  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[place]) S/p of colectomy, radiation and chemotherapy. -S/p of colostomy -Follow-up with oncology        Subjective: Patient seen with brother at bedside on rounds today.  He reports having stool output in his colostomy.  NG tube was clamped by surgery earlier this morning and he is so far tolerating clear liquids.  No other acute complaints.  Physical Exam: Vitals:   09/24/21 1945 09/25/21 0712 09/25/21 0734 09/25/21 1737  BP: (!) 140/54 (!) 192/83 (!) 157/67 (!) 160/82  Pulse: 82 81 67 65  Resp: '16 16 18 18  '$ Temp: 98.4 F (36.9 C) 98.4 F (36.9 C) 98.2 F (36.8 C) 98.7 F (37.1 C)  TempSrc: Oral     SpO2: 96% 98% 100% 99%  Weight:      Height:       General exam: awake, alert, no acute distress HEENT: NG tube in left nare clamped, moist mucus membranes, hearing grossly normal  Respiratory system: Normal respiratory effort on room air. Cardiovascular system: Regular rhythm, no peripheral edema Gastrointestinal system: Softer abdomen, nontender, colostomy present with stool in the bag Central nervous system: A&O x4. no gross focal neurologic deficits, normal speech Extremities: moves all , no edema, normal tone Psychiatry: normal mood, congruent affect, judgement and insight appear normal   Data Reviewed:  Notable labs: Bicarb 21, glucose 132, calcium 8.5  Family Communication: Brother at bedside on rounds today  Disposition: Status is: Inpatient Remains inpatient appropriate because: SBO with NG clamp trial today, DC pending clearance by surgery and tolerating advancement of diet   Planned Discharge Destination: Home  Time spent: 40 minutes  Author: Ezekiel Slocumb, DO 09/25/2021 5:37 PM  For on call review [email or web address].

## 2021-09-25 NOTE — Progress Notes (Signed)
[facility] SURGICAL PROGRESS NOTE (cpt [phone])  Hospital Day(s): 2.   Interval History: Patient seen and examined, no acute events or new complaints overnight. Patient reports he is feeling good this morning. No fever, chills, nausea, emesis, abdominal pain, or distension. Renal function has returned to normal ranges; sCr - 1.21; UO - 500 ccs. No significant electrolyte derangements. NGT output not recorded; looks like 300 ccs since 11 AM yesterday potentially. He does have stool in colostomy now.    Review of Systems:  Constitutional: denies fever, chills  HEENT: denies cough or congestion  Respiratory: denies any shortness of breath  Cardiovascular: denies chest pain or palpitations  Gastrointestinal: denies abdominal pain, N/V/D Genitourinary: denies burning with urination or urinary frequency Musculoskeletal: denies pain, decreased motor or sensation  Vital signs in last 24 hours: [min-max] current  Temp:  [98.1 F (36.7 C)-98.9 F (37.2 C)] 98.2 F (36.8 C) (07/18 0734) Pulse Rate:  [30-123] 67 (07/18 0734) Resp:  [16-18] 18 (07/18 0734) BP: (130-192)/(54-120) 157/67 (07/18 0734) SpO2:  [96 %-100 %] 100 % (07/18 0734)     Height: '5\' 3"'$  (160 cm) Weight: 81.6 kg BMI (Calculated): 31.89   Intake/Output last 2 shifts:  07/17 0701 - 07/18 0700 In: 794.3 [I.V.:794.3] Out: 500 [Urine:500]   Physical Exam:  Constitutional: alert, cooperative and no distress  HENT: normocephalic without obvious abnormality; NGT in place Eyes: PERRL, EOM's grossly intact and symmetric  Respiratory: breathing non-labored at rest  Cardiovascular: regular rate and sinus rhythm  Gastrointestinal: soft, non-tender, and non-distended, no rebound/guarding. 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; now with stool in bag Musculoskeletal: no edema or wounds, motor and sensation grossly intact, NT    Labs:     Latest Ref Rng & Units 09/23/2021    4:22 AM 09/19/2021    8:34 AM 08/01/2021    5:35 PM  CBC   WBC 4.0 - 10.5 K/uL 5.8  4.2  5.1   Hemoglobin 13.0 - 17.0 g/dL 15.5  13.5  15.2   Hematocrit 39.0 - 52.0 % 46.5  40.4  47.5   Platelets 150 - 400 K/uL 216  181  207       Latest Ref Rng & Units 09/25/2021    5:14 AM 09/24/2021    4:59 AM 09/23/2021    4:22 AM  CMP  Glucose 70 - 99 mg/dL 132  145  208   BUN 8 - 23 mg/dL '18  21  26   '$ Creatinine 0.61 - 1.24 mg/dL 1.21  1.36  1.45   Sodium 135 - 145 mmol/L 141  139  135   Potassium 3.5 - 5.1 mmol/L 3.5  3.8  4.1   Chloride 98 - 111 mmol/L 111  109  103   CO2 22 - 32 mmol/L '21  19  21   '$ Calcium 8.9 - 10.3 mg/dL 8.5  8.5  9.6   Total Protein 6.5 - 8.1 g/dL   8.3   Total Bilirubin 0.3 - 1.2 mg/dL   0.8   Alkaline Phos 38 - 126 U/L   81   AST 15 - 41 U/L   21   ALT 0 - 44 U/L   19      Imaging studies:   KUB (09/25/2021) personally reviewed which shows decrease in small bowel dilation, air in colon, and radiologist report pending:    Assessment/Plan: (ICD-10's: K2.609) 81 y.o. male with clinically and radiographically improved small bowel obstruction, potentially partial, secondary to previous surgical and radiation therapy  for colorectal cancer.    - Given return of bowel function and improvement in KUB, will proceed with NGT clamping trial. Clamp NGT x4 hours. Check residuals and if less than 150 ccs, we can remove NGT            - NPO for now + IVF Resuscitation            - Monitor abdominal examination; on-going bowel function - Pain control prn; antiemetics prn             - Mobilization as tolerated             - Further management per primary service; we will follow    All of the above findings and recommendations were discussed with the patient, and the medical team, and all of patient's questions were answered to his expressed satisfaction.  -- Edison Simon, PA-C [facility] 09/25/2021, 7:34 AM M-F: 7am - 4pm

## 2021-09-25 NOTE — Progress Notes (Signed)
Mobility Specialist - Progress Note    09/25/21 1700  Mobility  Activity Dangled on edge of bed  Level of Assistance Modified independent, requires aide device or extra time  Assistive Device None  Distance Ambulated (ft) 0 ft  Activity Response Tolerated well  $Mobility charge 1 Mobility    Pt semi supine upon arrival using RA. Completes bed mobility ModI. Prior to standing pt states that something isn't feeling right and request blood sugar to be checked. Pt hand trembling noted. Author returned pt back to bed and notified RN. To hold off at this time.  Merrily Brittle Mobility Specialist 09/25/21, 5:03 PM

## 2021-09-25 NOTE — Consult Note (Addendum)
[place] Nurse ostomy follow up Patient receiving care in [facility] 225. Person identified by the patient as his nephew present at time of my visit. Stoma type/location: LUQ, 2 pc 2 and 3/4 inch pouching system in place and according to patient, applied by our staff when he arrived. He states he likes this pouching system better than the one piece he normally uses at home. And, that his insurance company will not pay for it because it is too expensive, but he wishes to use it while here in the hospital.  The patient and nephew began to raise their voices and discuss the pouching system. I explained that since he likes the two piece better and wishes to use it while here, that is what I would place in his room for the nurses to use. The nephew was in agreement to this plan of use. Stomal assessment/size: not measured; measuring tool left at bedside along with 2 complete pouching systems. Peristomal assessment: deferred Treatment options for stomal/peristomal skin: barrier ring Output: brown, pudding consistency stool Ostomy pouching: 2pc.  Education provided: none; patient has had this for some time now. Enrolled patient in [place] Start Discharge program: No, patient has his own supplier established. [place] nurse will not follow at this time.  Please re-consult the [place] team if needed.  Val Riles, RN, MSN, CWOCN, CNS-BC, pager [phone]

## 2021-09-25 NOTE — TOC Progression Note (Signed)
Transition of Care [facility]) - Progression Note    Patient Details  Name: Willie [name] MRN: [id number] Date of Birth: 07/05/1940  Transition [facility]) CM/SW Contact  Beverly Sessions, RN Phone Number: 09/25/2021, 4:00 PM  Clinical Narrative:    Per MD request reached out to mobility tech to work with patient         Expected Discharge Plan and Services                                                 Social Determinants of Health (SDOH) Interventions    Readmission Risk Interventions     No data to display

## 2021-09-25 NOTE — Plan of Care (Signed)
Pt AAOx4, no pain. VS are stable. Plan to advance diet as tolerated and remove NGT. Bed is in lowest position, call light within reach. Will continue to monitor.

## 2021-09-26 DIAGNOSIS — K56609 Unspecified intestinal obstruction, unspecified as to partial versus complete obstruction: Secondary | ICD-10-CM | POA: Diagnosis not present

## 2021-09-26 LAB — BASIC METABOLIC PANEL
Anion gap: 9 (ref 5–15)
BUN: 15 mg/dL (ref 8–23)
CO2: 20 mmol/L — ABNORMAL LOW (ref 22–32)
Calcium: 8 mg/dL — ABNORMAL LOW (ref 8.9–10.3)
Chloride: 111 mmol/L (ref 98–111)
Creatinine, Ser: 1.09 mg/dL (ref 0.61–1.24)
GFR, Estimated: >60 mL/min (ref >60)
Glucose, Bld: 121 mg/dL — ABNORMAL HIGH (ref 70–99)
Potassium: 3.4 mmol/L — ABNORMAL LOW (ref 3.5–5.1)
Sodium: 140 mmol/L (ref 135–145)

## 2021-09-26 LAB — GLUCOSE, CAPILLARY
Glucose-Capillary: 136 mg/dL — ABNORMAL HIGH (ref 70–99)
Glucose-Capillary: 116 mg/dL — ABNORMAL HIGH (ref 70–99)
Glucose-Capillary: 157 mg/dL — ABNORMAL HIGH (ref 70–99)
Glucose-Capillary: 78 mg/dL (ref 70–99)
Glucose-Capillary: 127 mg/dL — ABNORMAL HIGH (ref 70–99)

## 2021-09-26 NOTE — Plan of Care (Signed)
Pt AAOx4, no pain. Tolerating clear liquids with no complications. VS are stable. Plan for mobility and to advance diet as tolerated. Bed is in lowest position, call light within reach. Will continue to monitor.

## 2021-09-26 NOTE — Progress Notes (Signed)
[facility]    SUBJECTIVE: Patient feels much better denies any pain no nausea vomiting feels well enough to potentially go home   Vitals:   09/25/21 1737 09/25/21 2017 09/26/21 0421 09/26/21 1520  BP: (!) 160/82 (!) 161/72 (!) 163/72 (!) 164/63  Pulse: 65 79 74 (!) 59  Resp: '18 20 20 18  '$ Temp: 98.7 F (37.1 C) 98.5 F (36.9 C) 98.5 F (36.9 C) 98.1 F (36.7 C)  TempSrc:  Oral    SpO2: 99% 98% 99% 100%  Weight:      Height:         Intake/Output Summary (Last 24 hours) at 09/26/2021 1646 Last data filed at 09/26/2021 1414 Gross per 24 hour  Intake 1560 ml  Output 1100 ml  Net 460 ml      PHYSICAL EXAM  General: Well developed, well nourished, in no acute distress HEENT:  Normocephalic and atramatic Neck:  No JVD.  Lungs: Clear bilaterally to auscultation and percussion. Heart: HRRR . Normal S1 and S2 without gallops or murmurs.  Abdomen: Bowel sounds are positive, abdomen soft and non-tender  Msk:  Back normal, normal gait. Normal strength and tone for age. Extremities: No clubbing, cyanosis or edema.   Neuro: Alert and oriented X 3. Psych:  Good affect, responds appropriately   LABS: Basic Metabolic Panel: Recent Labs    09/24/21 0459 09/25/21 0514 09/26/21 0450  NA 139 141 140  K 3.8 3.5 3.4*  CL 109 111 111  CO2 19* 21* 20*  GLUCOSE 145* 132* 121*  BUN '21 18 15  '$ CREATININE 1.36* 1.21 1.09  CALCIUM 8.5* 8.5* 8.0*  MG 2.2 2.2  --    Liver Function Tests: No results for input(s): "AST", "ALT", "ALKPHOS", "BILITOT", "PROT", "ALBUMIN" in the last 72 hours. No results for input(s): "LIPASE", "AMYLASE" in the last 72 hours. CBC: No results for input(s): "WBC", "NEUTROABS", "HGB", "HCT", "MCV", "PLT" in the last 72 hours. Cardiac Enzymes: No results for input(s): "CKTOTAL", "CKMB", "CKMBINDEX", "TROPONINI" in the last 72 hours. BNP: Invalid input(s): "POCBNP" D-Dimer: No results for input(s): "DDIMER" in the last 72 hours. Hemoglobin A1C: No  results for input(s): "HGBA1C" in the last 72 hours. Fasting Lipid Panel: No results for input(s): "CHOL", "HDL", "LDLCALC", "TRIG", "CHOLHDL", "LDLDIRECT" in the last 72 hours. Thyroid Function Tests: No results for input(s): "TSH", "T4TOTAL", "T3FREE", "THYROIDAB" in the last 72 hours.  Invalid input(s): "FREET3" Anemia Panel: No results for input(s): "VITAMINB12", "FOLATE", "FERRITIN", "TIBC", "IRON", "RETICCTPCT" in the last 72 hours.  DG Abd 2 Views  Result Date: 09/25/2021 CLINICAL DATA:  Small bowel obstruction. Nasogastric tube improving symptoms. History of rectal cancer. EXAM: ABDOMEN - 2 VIEW COMPARISON:  09/23/2021 FINDINGS: Nasogastric tube tip overlies the level of the stomach. Bowel gas pattern is nonobstructed. No evidence for free intraperitoneal air. LEFT LOWER QUADRANT ostomy. Surgical clips in the region of the central pelvis. IMPRESSION: Nasogastric tube tip to level of the stomach. Electronically Signed   By: Nolon Nations M.D.   On: 09/25/2021 08:26     Echo pending  TELEMETRY: Sinus rhythm rate of 95 occasional PVCs:  ASSESSMENT AND PLAN:  Principal Problem:   SBO (small bowel obstruction) (HCC) Active Problems:   Ventricular tachycardia (HCC)   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HCC)   Essential hypertension   Hypercholesteremia   Chronic kidney disease, stage 3a (HCC)   CAD (coronary artery disease)   Type II diabetes mellitus with renal manifestations ([place])    Plan Diabetes type 2 continue  current medical therapy follow-up with endocrine and primary physician Small bowel obstruction recommend conservative therapy NG suction and bowel rest Known coronary disease previous PCI and stent no recent angina continue conservative management Nonsustained ventricular tachycardia reasonably stable continue conservative management correct electrolytes Pretension reasonably controlled continue to maintain current medical therapy Chronic renal sufficiency stage III have patient  follow-up with nephrology Rectal cancer colectomy radiation and chemotherapy colostomy in place continue conservative management   Yolonda Kida, MD 09/26/2021 4:46 PM

## 2021-09-26 NOTE — Progress Notes (Signed)
[facility] SURGICAL PROGRESS NOTE (cpt [id number])  Hospital Day(s): 3.   Interval History: Patient seen and examined, no acute events or new complaints overnight. Patient reports he is feeling good this afternoon. No fever, chills, nausea, emesis, abdominal pain, or distension.  Apparently tolerating diet, having colostomy appliance change completed by family member during my visit.  Review of Systems:  Constitutional: denies fever, chills  HEENT: denies cough or congestion  Respiratory: denies any shortness of breath  Cardiovascular: denies chest pain or palpitations  Gastrointestinal: denies abdominal pain, N/V/D Genitourinary: denies burning with urination or urinary frequency Musculoskeletal: denies pain, decreased motor or sensation  Vital signs in last 24 hours: [min-max] current  Temp:  [98.2 F (36.8 C)-98.7 F (37.1 C)] 98.5 F (36.9 C) (07/19 0421) Pulse Rate:  [65-79] 74 (07/19 0421) Resp:  [18-20] 20 (07/19 0421) BP: (157-163)/(67-82) 163/72 (07/19 0421) SpO2:  [98 %-100 %] 99 % (07/19 0421)     Height: '5\' 3"'$  (160 cm) Weight: 81.6 kg BMI (Calculated): 31.89   Intake/Output last 2 shifts:  07/18 0701 - 07/19 0700 In: -  Out: 760 [Urine:700; Emesis/NG output:10; Stool:50]   Physical Exam:  Constitutional: alert, cooperative and no distress  HENT: normocephalic without obvious abnormality; NGT in place Eyes: PERRL, EOM's grossly intact and symmetric  Respiratory: breathing non-labored at rest  Cardiovascular: regular rate and sinus rhythm  Gastrointestinal: soft, non-tender, and non-distended, no rebound/guarding. 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; now with stool in bag Musculoskeletal: no edema or wounds, motor and sensation grossly intact, NT    Labs:     Latest Ref Rng & Units 09/23/2021    4:22 AM 09/19/2021    8:34 AM 08/01/2021    5:35 PM  CBC  WBC 4.0 - 10.5 K/uL 5.8  4.2  5.1   Hemoglobin 13.0 - 17.0 g/dL 15.5  13.5  15.2   Hematocrit 39.0 - 52.0  % 46.5  40.4  47.5   Platelets 150 - 400 K/uL 216  181  207       Latest Ref Rng & Units 09/26/2021    4:50 AM 09/25/2021    5:14 AM 09/24/2021    4:59 AM  CMP  Glucose 70 - 99 mg/dL 121  132  145   BUN 8 - 23 mg/dL '15  18  21   '$ Creatinine 0.61 - 1.24 mg/dL 1.09  1.21  1.36   Sodium 135 - 145 mmol/L 140  141  139   Potassium 3.5 - 5.1 mmol/L 3.4  3.5  3.8   Chloride 98 - 111 mmol/L 111  111  109   CO2 22 - 32 mmol/L '20  21  19   '$ Calcium 8.9 - 10.3 mg/dL 8.0  8.5  8.5      Imaging studies:   KUB (09/25/2021) personally reviewed which shows decrease in small bowel dilation, air in colon, and radiologist report pending:    Assessment/Plan: (ICD-10's: K48.609) 81 y.o. male with clinically and radiographically improved small bowel obstruction, potentially partial, secondary to previous surgical and radiation therapy for colorectal cancer.    -Advance diet as tolerated to goal.            - Monitor abdominal examination; on-going bowel function - Pain control prn; antiemetics prn             - Mobilization as tolerated             - Further management per primary service; we will sign off for  now.  All of the above findings and recommendations were discussed with the patient, and the medical team, and all of patient's questions were answered to his expressed satisfaction.  -- Ronny Bacon,  [facility] 09/26/2021, 7:31 AM

## 2021-09-26 NOTE — Progress Notes (Signed)
PROGRESS NOTE    Willie [name]  IEP:[id number] DOB: 05-19-1940 DOA: 09/23/2021 PCP: [name], MD    Brief Narrative:  81 y.o. male with medical history significant of rectal cancer (s/p for colectomy, colostomy, chemotherapy and radiation therapy), hypertension, hyperlipidemia, diabetes mellitus, gastric ulcer, CAD, V. tach, small bowel obstruction, who presented to the ED on 09/23/2021 for evaluation of  nausea, vomiting, abdominal pain x2 days.  Last BM was 2 days prior.  Evaluation in the ED revealed signs of low-grade partial small bowel obstruction on CT abdomen pelvis felt related to prior surgery and adhesions.  General surgery was consulted.  NG tube placed for decompression.    719 tolerated clears will advance to full liquid diet  Consultants:  General surgery  Procedures:   Antimicrobials:      Subjective: Tolerated clears. No abd pain/n/v  Objective: Vitals:   09/25/21 0734 09/25/21 1737 09/25/21 2017 09/26/21 0421  BP: (!) 157/67 (!) 160/82 (!) 161/72 (!) 163/72  Pulse: 67 65 79 74  Resp: '18 18 20 20  '$ Temp: 98.2 F (36.8 C) 98.7 F (37.1 C) 98.5 F (36.9 C) 98.5 F (36.9 C)  TempSrc:   Oral   SpO2: 100% 99% 98% 99%  Weight:      Height:        Intake/Output Summary (Last 24 hours) at 09/26/2021 0904 Last data filed at 09/26/2021 0854 Gross per 24 hour  Intake --  Output 960 ml  Net -960 ml   Filed Weights   09/23/21 0740  Weight: 81.6 kg    Examination: Calm, NAD Cta no w/r Reg s1/s2 no gallop Soft benign colostomy in place with liquidy stool No edema Aaoxox3  Mood and affect appropriate in current setting     Data Reviewed: I have personally reviewed following labs and imaging studies  CBC: Recent Labs  Lab 09/23/21 0422  WBC 5.8  NEUTROABS 3.6  HGB 15.5  HCT 46.5  MCV 86.8  PLT 660   Basic Metabolic Panel: Recent Labs  Lab 09/23/21 0422 09/24/21 0459 09/25/21 0514 09/26/21 0450  NA 135 139 141 140  K 4.1 3.8  3.5 3.4*  CL 103 109 111 111  CO2 21* 19* 21* 20*  GLUCOSE 208* 145* 132* 121*  BUN 26* '21 18 15  '$ CREATININE 1.45* 1.36* 1.21 1.09  CALCIUM 9.6 8.5* 8.5* 8.0*  MG  --  2.2 2.2  --    GFR: Estimated Creatinine Clearance: 48.5 mL/min (by C-G formula based on SCr of 1.09 mg/dL). Liver Function Tests: Recent Labs  Lab 09/23/21 0422  AST 21  ALT 19  ALKPHOS 81  BILITOT 0.8  PROT 8.3*  ALBUMIN 4.1   Recent Labs  Lab 09/23/21 0422  LIPASE 21   No results for input(s): "AMMONIA" in the last 168 hours. Coagulation Profile: Recent Labs  Lab 09/23/21 1158  INR 1.1   Cardiac Enzymes: No results for input(s): "CKTOTAL", "CKMB", "CKMBINDEX", "TROPONINI" in the last 168 hours. BNP (last 3 results) No results for input(s): "PROBNP" in the last 8760 hours. HbA1C: No results for input(s): "HGBA1C" in the last 72 hours. CBG: Recent Labs  Lab 09/25/21 1417 09/25/21 1653 09/25/21 2047 09/25/21 2325 09/26/21 0818  GLUCAP 71 88 202* 134* 136*   Lipid Profile: No results for input(s): "CHOL", "HDL", "LDLCALC", "TRIG", "CHOLHDL", "LDLDIRECT" in the last 72 hours. Thyroid Function Tests: No results for input(s): "TSH", "T4TOTAL", "FREET4", "T3FREE", "THYROIDAB" in the last 72 hours. Anemia Panel: No results for  input(s): "VITAMINB12", "FOLATE", "FERRITIN", "TIBC", "IRON", "RETICCTPCT" in the last 72 hours. Sepsis Labs: No results for input(s): "PROCALCITON", "LATICACIDVEN" in the last 168 hours.  No results found for this or any previous visit (from the past 240 hour(s)).       Radiology Studies: DG Abd 2 Views  Result Date: 09/25/2021 CLINICAL DATA:  Small bowel obstruction. Nasogastric tube improving symptoms. History of rectal cancer. EXAM: ABDOMEN - 2 VIEW COMPARISON:  09/23/2021 FINDINGS: Nasogastric tube tip overlies the level of the stomach. Bowel gas pattern is nonobstructed. No evidence for free intraperitoneal air. LEFT LOWER QUADRANT ostomy. Surgical clips in the  region of the central pelvis. IMPRESSION: Nasogastric tube tip to level of the stomach. Electronically Signed   By: Nolon Nations M.D.   On: 09/25/2021 08:26        Scheduled Meds:  brimonidine  1 drop Both Eyes BID   enoxaparin (LOVENOX) injection  40 mg Subcutaneous Q24H   insulin aspart  0-5 Units Subcutaneous QHS   insulin aspart  0-9 Units Subcutaneous TID WC   insulin aspart protamine- aspart  6 Units Subcutaneous BID WC   lisinopril  10 mg Oral Daily   metoprolol succinate  50 mg Oral Daily   Netarsudil-Latanoprost  1 drop Both Eyes QHS   pantoprazole  40 mg Oral Daily   rosuvastatin  10 mg Oral Daily   Continuous Infusions:  sodium chloride 75 mL/hr at 09/25/21 0739    Assessment & Plan:   Principal Problem:   SBO (small bowel obstruction) ([place]) Active Problems:   Type II diabetes mellitus with renal manifestations (HCC)   Ventricular tachycardia ([place])   Essential hypertension   Hypercholesteremia   Chronic kidney disease, stage 3a ([place])   CAD (coronary artery disease)   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HCC)   SBO (small bowel obstruction) ([place]) CT scan in the ED showed low-grade small bowel obstruction.   --Management per general surgery --Serial KUBs --NG tube clamped this morning 7/19 tolerated clears.  Will advance diet as tolerated General surgery following Monitor serial abdominal exams and bowel function Mobilization as tolerated   Type II diabetes mellitus with renal manifestations (HCC) Recent A1c 8.0, poorly controlled.   7/19 BG stable continue current regimen   Ventricular tachycardia (HCC) Heart rate 116, 98 on admission 7/17 --ongoing tachycardia and trigeminy on telemetry 7/19 cards following Continue beta blk   Essential hypertension Continue current regimen   Hypercholesteremia On statin   Chronic kidney disease, stage 3a ([place]) Renal function near baseline on admission at 1.48 with recent baseline creatinine 1.2-1.4.  Creatinine today:  1.21 -- Monitor BMP -- Avoid nephrotoxins and renally dose meds as indicated   CAD (coronary artery disease) No chest pain. Continue aspirin statin and beta-blocker   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[place]) S/p of colectomy, radiation and chemotherapy. -S/p of colostomy -Follow-up with oncology             DVT prophylaxis: Lovenox Code Status: DNR Family Communication: Family at bedside Disposition Plan: Back home Status is: Inpatient Remains inpatient appropriate because: IV treatment.  Need to see if tolerated feeding and advancing diet.          LOS: 3 days   Time spent: 35 minutes    Nolberto Hanlon, MD [facility] Pager [phone]  If 7PM-7AM, please contact night-coverage 09/26/2021, 9:04 AM

## 2021-09-26 NOTE — Progress Notes (Signed)
Mobility Specialist - Progress Note   09/26/21 1600  Mobility  Activity Ambulated independently in hallway  Level of Assistance Independent  Assistive Device None  Distance Ambulated (ft) 90 ft  Activity Response Tolerated well  $Mobility charge 1 Mobility     Pt ambulated in hallway independently. No complaints.    Kathee Delton Mobility Specialist 09/26/21, 4:26 PM

## 2021-09-27 DIAGNOSIS — K56609 Unspecified intestinal obstruction, unspecified as to partial versus complete obstruction: Secondary | ICD-10-CM | POA: Diagnosis not present

## 2021-09-27 LAB — GLUCOSE, CAPILLARY
Glucose-Capillary: 138 mg/dL — ABNORMAL HIGH (ref 70–99)
Glucose-Capillary: 238 mg/dL — ABNORMAL HIGH (ref 70–99)

## 2021-09-27 NOTE — Plan of Care (Signed)
AVS and education provided. IV removed with no complications. VS are stable. Tolerated lunch with no complications. Pt transported by [facility] to main lobby where nephew will transport him home.

## 2021-09-27 NOTE — Care Management Important Message (Signed)
Important Message  Patient Details  Name: Willie [name] MRN: [id number] Date of Birth: May 29, 1940   Medicare Important Message Given:  Yes     Dannette Barbara 09/27/2021, 1:53 PM

## 2021-09-27 NOTE — Discharge Summary (Signed)
Willie [name] EVO:[id number] DOB: 1940/05/25 DOA: 09/23/2021  PCP: [name], MD  Admit date: 09/23/2021 Discharge date: 09/27/2021  Admitted From: Home Disposition: Home  Recommendations for Outpatient Follow-up:  Follow up with PCP in 1 week Please obtain BMP/CBC in one week      Discharge Condition:Stable CODE STATUS: DNR Diet recommendation: Heart Healthy  Brief/Interim Summary: Per HPI:81 y.o. male with medical history significant of rectal cancer (s/p for colectomy, colostomy, chemotherapy and radiation therapy), hypertension, hyperlipidemia, diabetes mellitus, gastric ulcer, CAD, V. tach, small bowel obstruction, who presented to the ED on 09/23/2021 for evaluation of  nausea, vomiting, abdominal pain x2 days.  Last BM was 2 days prior.  Evaluation in the ED revealed signs of low-grade partial small bowel obstruction on CT abdomen pelvis felt related to prior surgery and adhesions.  General surgery was consulted.  NG tube placed for decompression.  SBO (small bowel obstruction) (HCC) CT scan in the ED showed low-grade small bowel obstruction.   --Management per general surgery NGT was placed.  Diet was advanced and tolerated.     Type II diabetes mellitus with renal manifestations (HCC) Recent A1c 8.0 Continue home meds Follow-up with PCP for further evaluation   Ventricular tachycardia (HCC)  tachycardia and trigeminy on telemetry Cardiology was consulted Beta blockers was continued   Essential hypertension Continue home meds   Hypercholesteremia On statin   Chronic kidney disease, stage 3a (HCC) At baseline   CAD (coronary artery disease) No chest pain. Continue aspirin statin and beta-blocker   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[place]) S/p of colectomy, radiation and chemotherapy. -S/p of colostomy -Follow-up with oncology               Discharge Diagnoses:  Principal Problem:   SBO (small bowel obstruction) ([place]) Active Problems:   Type II diabetes  mellitus with renal manifestations (HCC)   Ventricular tachycardia (HCC)   Essential hypertension   Hypercholesteremia   Chronic kidney disease, stage 3a ([place])   CAD (coronary artery disease)   Rectal cancer [facility])    Discharge Instructions  Discharge Instructions     Diet - low sodium heart healthy   Complete by: As directed    Increase activity slowly   Complete by: As directed       Allergies as of 09/27/2021   No Known Allergies      Medication List     TAKE these medications    aspirin EC 81 MG tablet Take 81 mg by mouth daily.   brimonidine 0.2 % ophthalmic solution Commonly known as: ALPHAGAN Place 1 drop into both eyes 2 (two) times daily.   lisinopril 10 MG tablet Commonly known as: ZESTRIL Take 10 mg by mouth daily.   metoprolol succinate 50 MG 24 hr tablet Commonly known as: TOPROL-XL Take 50 mg by mouth daily.   Netarsudil-Latanoprost 0.02-0.005 % Soln Place 1 drop into both eyes at bedtime.   NovoLOG Mix 70/30 FlexPen (70-30) 100 UNIT/ML FlexPen Generic drug: insulin aspart protamine - aspart Inject 12 Units into the skin 2 (two) times daily with a meal.   pantoprazole 40 MG tablet Commonly known as: PROTONIX Take 1 tablet (40 mg total) by mouth 2 (two) times daily before a meal. What changed: when to take this   rosuvastatin 10 MG tablet Commonly known as: CRESTOR Take 10 mg by mouth daily.   traMADol 50 MG tablet Commonly known as: Ultram Take 1 tablet (50 mg total) by mouth every 6 (six) hours as needed.  traZODone 50 MG tablet Commonly known as: DESYREL Take 50 mg by mouth at bedtime as needed for sleep.        Follow-up Information     [name], MD Follow up in 1 week(s).   Specialty: Internal Medicine Contact information: [place] [address] [phone]                No Known Allergies  Consultations: Cardiology  GSX   Procedures/Studies: DG Abd 2 Views  Result Date:  09/25/2021 CLINICAL DATA:  Small bowel obstruction. Nasogastric tube improving symptoms. History of rectal cancer. EXAM: ABDOMEN - 2 VIEW COMPARISON:  09/23/2021 FINDINGS: Nasogastric tube tip overlies the level of the stomach. Bowel gas pattern is nonobstructed. No evidence for free intraperitoneal air. LEFT LOWER QUADRANT ostomy. Surgical clips in the region of the central pelvis. IMPRESSION: Nasogastric tube tip to level of the stomach. Electronically Signed   By: Nolon Nations M.D.   On: 09/25/2021 08:26   DG Abd 1 View  Result Date: 09/23/2021 CLINICAL DATA:  Enteric tube placement EXAM: ABDOMEN - 1 VIEW COMPARISON:  10/16/2018 abdominal radiograph FINDINGS: Enteric tube terminates in the gastric fundus. No dilated small bowel loops. No evidence of pneumatosis or pneumoperitoneum in the upper abdomen. Clear lung bases. Mild lumbar spondylosis. IMPRESSION: Enteric tube terminates in the gastric fundus. Electronically Signed   By: Ilona Sorrel M.D.   On: 09/23/2021 11:22   CT Abdomen Pelvis W Contrast  Result Date: 09/23/2021 CLINICAL DATA:  Nausea, vomiting and abdominal pain with decreased stool output from colostomy. EXAM: CT ABDOMEN AND PELVIS WITH CONTRAST TECHNIQUE: Multidetector CT imaging of the abdomen and pelvis was performed using the standard protocol following bolus administration of intravenous contrast. RADIATION DOSE REDUCTION: This exam was performed according to the departmental dose-optimization program which includes automated exposure control, adjustment of the mA and/or kV according to patient size and/or use of iterative reconstruction technique. CONTRAST:  157m OMNIPAQUE IOHEXOL 300 MG/ML  SOLN COMPARISON:  Study with IV contrast 08/01/2021, study with IV and oral contrast 05/17/2021. FINDINGS: Lower chest: No acute abnormality. The cardiac size is normal. There is a stent in the distal LAD coronary artery. Small hiatal hernia with mildly thickened distal thoracic esophagus.  Hepatobiliary: The liver mildly steatotic. There is no mass enhancement. Stable tiny hypodensity in the dome of the left hepatic lobe on 2:12 probable tiny cyst. Otherwise unremarkable liver. Unremarkable gallbladder and bile ducts. Pancreas: Partially atrophic and otherwise unremarkable. Spleen: Small wedge-shaped hypodensity in the subcapsular posterior spleen most likely a small infarct and was seen previously. Otherwise unremarkable spleen. Adrenals/Urinary Tract: No adrenal mass. 2.2 cm cyst inferior pole right kidney. Stable chronic perinephric stranding and trace perinephric fluid. There is no renal mass calculus or urinary obstruction. The bladder is contracted and not well seen. Stomach/Bowel: Gastric fluid distention is again noted. The appendix is normal. Distal colectomy again seen and appears to have been either at the junction of the transverse and descending colon or in the proximal descending colon. Residual left colonic output through a left mid abdominal colostomy. There are diffuse diverticula without evidence of diverticulitis. Most of the small bowel is normal caliber but there are fluid-filled dilated segments in the pelvis with extension into the left iliac fossa, which again show mucosal enhancement. This was seen on 08/01/2021 but these segments were normal caliber on 05/17/2021. Given persistence of the findings, with a few segments dilated up to 3.4 cm, this could represent a  low-grade partial small bowel obstruction possibly related to adhesions from the prior surgery. The wall enhancement in the segments may relate to prior XRT to the area or other ongoing etiology for nonspecific enteritis. There are no inflammatory changes associated with this but there is a small volume of pelvic ascites which was not seen previously noted above bladder and in the posterior deep pelvis. Vascular/Lymphatic: Aortic atherosclerosis. No enlarged abdominal or pelvic lymph nodes. Reproductive: Surgically  absent prostate. Other: Presacral soft tissue thickening continues to be noted but is not significantly changed. There are small umbilical and inguinal fat hernias. There is no free air, abscess or free hemorrhage. Musculoskeletal: There are degenerative changes of the spine and hip joints. No aggressive or destructive bone lesions. Bridging osteophytes both anterior SI joints. IMPRESSION: 1. There is continued dilatation of pelvic small bowel segments up to 3.4 cm with wall enhancement, while the remainder of the small bowel remains normal caliber. Lack of interval change in this finding suggests a probable low-grade partial small bowel obstruction related to the prior surgery with adhesions, with the wall enhancement nonspecific and could be from prior XRT or ongoing enteritis of other etiology. There is no bowel pneumatosis, abscess or free air, or acute inflammatory changes. 2. Prior distal colectomy with left abdominal colostomy, diverticula along the residual large bowel but no evidence of diverticulitis. 3. Aortic atherosclerosis. 4. Small amount of scattered pelvic ascites not seen previously. No localizing collection. 5. Umbilical and inguinal fat hernias. Electronically Signed   By: Telford Nab M.D.   On: 09/23/2021 06:04   DG Chest Port 1 View  Result Date: 09/23/2021 CLINICAL DATA:  81 year old male with vomiting and abdominal pain. History of rectal cancer. EXAM: PORTABLE CHEST 1 VIEW COMPARISON:  Chest radiograph 09/19/2021 and earlier. FINDINGS: Portable AP semi upright view at 0437 hours. Lung volumes and mediastinal contours remain within normal limits. Visualized tracheal air column is within normal limits. Allowing for portable technique the lungs are clear. No pneumothorax or pneumoperitoneum. Paucity of bowel gas in the upper abdomen. No acute osseous abnormality identified. IMPRESSION: Negative portable chest. Electronically Signed   By: Genevie Ann M.D.   On: 09/23/2021 05:10   DG Chest 2  View  Result Date: 09/19/2021 CLINICAL DATA:  Chest pain EXAM: CHEST - 2 VIEW COMPARISON:  05/22/2021 FINDINGS: The heart size and mediastinal contours are within normal limits. Both lungs are clear. The visualized skeletal structures are unremarkable. IMPRESSION: No active cardiopulmonary disease. Electronically Signed   By: Davina Poke D.O.   On: 09/19/2021 09:00      Subjective: No new complaints.  No nausea vomiting or abdominal pain  Discharge Exam: Vitals:   09/27/21 0453 09/27/21 0734  BP: (!) 160/98 (!) 162/73  Pulse: 71 63  Resp: 20 18  Temp: 97.8 F (36.6 C) 98.3 F (36.8 C)  SpO2: 98%    Vitals:   09/26/21 1520 09/26/21 2003 09/27/21 0453 09/27/21 0734  BP: (!) 164/63 (!) 155/84 (!) 160/98 (!) 162/73  Pulse: (!) 59 85 71 63  Resp: '18 20 20 18  '$ Temp: 98.1 F (36.7 C) 98.1 F (36.7 C) 97.8 F (36.6 C) 98.3 F (36.8 C)  TempSrc:      SpO2: 100% 100% 98%   Weight:      Height:        General: Pt is alert, awake, not in acute distress Cardiovascular: RRR, S1/S2 +, no rubs, no gallops Respiratory: CTA bilaterally, no wheezing, no rhonchi Abdominal: Soft,  NT, ND, bowel sounds + Extremities: no edema, no cyanosis    The results of significant diagnostics from this hospitalization (including imaging, microbiology, ancillary and laboratory) are listed below for reference.     Microbiology: No results found for this or any previous visit (from the past 240 hour(s)).   Labs: BNP (last 3 results) No results for input(s): "BNP" in the last 8760 hours. Basic Metabolic Panel: Recent Labs  Lab 09/23/21 0422 09/24/21 0459 09/25/21 0514 09/26/21 0450  NA 135 139 141 140  K 4.1 3.8 3.5 3.4*  CL 103 109 111 111  CO2 21* 19* 21* 20*  GLUCOSE 208* 145* 132* 121*  BUN 26* '21 18 15  '$ CREATININE 1.45* 1.36* 1.21 1.09  CALCIUM 9.6 8.5* 8.5* 8.0*  MG  --  2.2 2.2  --    Liver Function Tests: Recent Labs  Lab 09/23/21 0422  AST 21  ALT 19  ALKPHOS 81   BILITOT 0.8  PROT 8.3*  ALBUMIN 4.1   Recent Labs  Lab 09/23/21 0422  LIPASE 21   No results for input(s): "AMMONIA" in the last 168 hours. CBC: Recent Labs  Lab 09/23/21 0422  WBC 5.8  NEUTROABS 3.6  HGB 15.5  HCT 46.5  MCV 86.8  PLT 216   Cardiac Enzymes: No results for input(s): "CKTOTAL", "CKMB", "CKMBINDEX", "TROPONINI" in the last 168 hours. BNP: Invalid input(s): "POCBNP" CBG: Recent Labs  Lab 09/26/21 1521 09/26/21 1812 09/26/21 2128 09/27/21 0729 09/27/21 1130  GLUCAP 157* 78 127* 138* 238*   D-Dimer No results for input(s): "DDIMER" in the last 72 hours. Hgb A1c No results for input(s): "HGBA1C" in the last 72 hours. Lipid Profile No results for input(s): "CHOL", "HDL", "LDLCALC", "TRIG", "CHOLHDL", "LDLDIRECT" in the last 72 hours. Thyroid function studies No results for input(s): "TSH", "T4TOTAL", "T3FREE", "THYROIDAB" in the last 72 hours.  Invalid input(s): "FREET3" Anemia work up No results for input(s): "VITAMINB12", "FOLATE", "FERRITIN", "TIBC", "IRON", "RETICCTPCT" in the last 72 hours. Urinalysis    Component Value Date/Time   COLORURINE YELLOW (A) 09/23/2021 0521   APPEARANCEUR HAZY (A) 09/23/2021 0521   LABSPEC 1.020 09/23/2021 0521   PHURINE 5.0 09/23/2021 0521   GLUCOSEU NEGATIVE 09/23/2021 0521   HGBUR SMALL (A) 09/23/2021 0521   BILIRUBINUR NEGATIVE 09/23/2021 0521   KETONESUR 20 (A) 09/23/2021 0521   PROTEINUR 100 (A) 09/23/2021 0521   NITRITE NEGATIVE 09/23/2021 0521   LEUKOCYTESUR NEGATIVE 09/23/2021 0521   Sepsis Labs Recent Labs  Lab 09/23/21 0422  WBC 5.8   Microbiology No results found for this or any previous visit (from the past 240 hour(s)).   Time coordinating discharge: Over 30 minutes  SIGNED:   Nolberto Hanlon, MD  [facility] 09/27/2021, 1:17 PM Pager   If 7PM-7AM, please contact night-coverage [email or web address] Password [email or web address]

## 2021-10-01 ENCOUNTER — Other Ambulatory Visit: Payer: Self-pay

## 2021-10-05 ENCOUNTER — Other Ambulatory Visit: Payer: Self-pay

## 2021-10-19 ENCOUNTER — Ambulatory Visit: Payer: Medicare PPO | Admitting: Radiation Oncology

## 2021-11-01 ENCOUNTER — Encounter: Payer: Self-pay | Admitting: Oncology

## 2021-11-21 ENCOUNTER — Inpatient Hospital Stay
Admission: EM | Admit: 2021-11-21 | Discharge: 2021-11-29 | DRG: 336 | Disposition: A | Discharge status: Home / Self Care | Payer: Medicare PPO | Attending: Internal Medicine | Admitting: Internal Medicine

## 2021-11-21 ENCOUNTER — Inpatient Hospital Stay: Payer: Medicare PPO

## 2021-11-21 ENCOUNTER — Other Ambulatory Visit: Payer: Self-pay

## 2021-11-21 ENCOUNTER — Inpatient Hospital Stay
Admission: RE | Admit: 2021-11-21 | Discharge: 2021-11-21 | Disposition: A | Discharge status: Home / Self Care | Payer: Medicare PPO | Source: Ambulatory Visit | Attending: Nurse Practitioner | Admitting: Nurse Practitioner

## 2021-11-21 ENCOUNTER — Inpatient Hospital Stay: Payer: Medicare PPO | Attending: Radiation Oncology

## 2021-11-21 ENCOUNTER — Emergency Department: Payer: Medicare PPO

## 2021-11-21 DIAGNOSIS — R54 Age-related physical debility: Secondary | ICD-10-CM | POA: Diagnosis present

## 2021-11-21 DIAGNOSIS — Z923 Personal history of irradiation: Secondary | ICD-10-CM | POA: Diagnosis not present

## 2021-11-21 DIAGNOSIS — Z933 Colostomy status: Secondary | ICD-10-CM

## 2021-11-21 DIAGNOSIS — I10 Essential (primary) hypertension: Secondary | ICD-10-CM | POA: Insufficient documentation

## 2021-11-21 DIAGNOSIS — Z833 Family history of diabetes mellitus: Secondary | ICD-10-CM

## 2021-11-21 DIAGNOSIS — I472 Ventricular tachycardia, unspecified: Secondary | ICD-10-CM | POA: Diagnosis present

## 2021-11-21 DIAGNOSIS — Z7901 Long term (current) use of anticoagulants: Secondary | ICD-10-CM | POA: Diagnosis not present

## 2021-11-21 DIAGNOSIS — K56609 Unspecified intestinal obstruction, unspecified as to partial versus complete obstruction: Secondary | ICD-10-CM | POA: Diagnosis present

## 2021-11-21 DIAGNOSIS — Z794 Long term (current) use of insulin: Secondary | ICD-10-CM

## 2021-11-21 DIAGNOSIS — I4729 Other ventricular tachycardia: Secondary | ICD-10-CM

## 2021-11-21 DIAGNOSIS — K567 Ileus, unspecified: Secondary | ICD-10-CM | POA: Diagnosis not present

## 2021-11-21 DIAGNOSIS — I160 Hypertensive urgency: Secondary | ICD-10-CM | POA: Diagnosis present

## 2021-11-21 DIAGNOSIS — N179 Acute kidney failure, unspecified: Secondary | ICD-10-CM | POA: Diagnosis present

## 2021-11-21 DIAGNOSIS — K5730 Diverticulosis of large intestine without perforation or abscess without bleeding: Secondary | ICD-10-CM | POA: Diagnosis present

## 2021-11-21 DIAGNOSIS — E872 Acidosis, unspecified: Secondary | ICD-10-CM | POA: Diagnosis present

## 2021-11-21 DIAGNOSIS — Z23 Encounter for immunization: Secondary | ICD-10-CM | POA: Diagnosis present

## 2021-11-21 DIAGNOSIS — K219 Gastro-esophageal reflux disease without esophagitis: Secondary | ICD-10-CM | POA: Diagnosis present

## 2021-11-21 DIAGNOSIS — K56600 Partial intestinal obstruction, unspecified as to cause: Secondary | ICD-10-CM

## 2021-11-21 DIAGNOSIS — Z79899 Other long term (current) drug therapy: Secondary | ICD-10-CM

## 2021-11-21 DIAGNOSIS — D509 Iron deficiency anemia, unspecified: Secondary | ICD-10-CM | POA: Diagnosis not present

## 2021-11-21 DIAGNOSIS — Z66 Do not resuscitate: Secondary | ICD-10-CM | POA: Diagnosis present

## 2021-11-21 DIAGNOSIS — Z8546 Personal history of malignant neoplasm of prostate: Secondary | ICD-10-CM | POA: Insufficient documentation

## 2021-11-21 DIAGNOSIS — K5651 Intestinal adhesions [bands], with partial obstruction: Principal | ICD-10-CM | POA: Diagnosis present

## 2021-11-21 DIAGNOSIS — Z8041 Family history of malignant neoplasm of ovary: Secondary | ICD-10-CM | POA: Insufficient documentation

## 2021-11-21 DIAGNOSIS — C19 Malignant neoplasm of rectosigmoid junction: Secondary | ICD-10-CM | POA: Diagnosis not present

## 2021-11-21 DIAGNOSIS — E7800 Pure hypercholesterolemia, unspecified: Secondary | ICD-10-CM | POA: Diagnosis present

## 2021-11-21 DIAGNOSIS — E119 Type 2 diabetes mellitus without complications: Secondary | ICD-10-CM | POA: Diagnosis present

## 2021-11-21 DIAGNOSIS — I2510 Atherosclerotic heart disease of native coronary artery without angina pectoris: Secondary | ICD-10-CM | POA: Diagnosis present

## 2021-11-21 DIAGNOSIS — C20 Malignant neoplasm of rectum: Secondary | ICD-10-CM | POA: Insufficient documentation

## 2021-11-21 DIAGNOSIS — Z9221 Personal history of antineoplastic chemotherapy: Secondary | ICD-10-CM | POA: Diagnosis not present

## 2021-11-21 DIAGNOSIS — Z85048 Personal history of other malignant neoplasm of rectum, rectosigmoid junction, and anus: Secondary | ICD-10-CM

## 2021-11-21 LAB — CBC WITH DIFFERENTIAL/PLATELET
Abs Immature Granulocytes: 0.02 10*3/uL (ref 0.00–0.07)
Abs Immature Granulocytes: 0.03 10*3/uL (ref 0.00–0.07)
Basophils Absolute: 0 10*3/uL (ref 0.0–0.1)
Basophils Absolute: 0 10*3/uL (ref 0.0–0.1)
Basophils Relative: 1 %
Basophils Relative: 0 %
Eosinophils Absolute: 0.1 10*3/uL (ref 0.0–0.5)
Eosinophils Absolute: 0 10*3/uL (ref 0.0–0.5)
Eosinophils Relative: 3 %
Eosinophils Relative: 0 %
HCT: 41.5 % (ref 39.0–52.0)
HCT: 45.9 % (ref 39.0–52.0)
Hemoglobin: 13.7 g/dL (ref 13.0–17.0)
Hemoglobin: 15.4 g/dL (ref 13.0–17.0)
Immature Granulocytes: 1 %
Immature Granulocytes: 0 %
Lymphocytes Relative: 26 %
Lymphocytes Relative: 10 %
Lymphs Abs: 1 10*3/uL (ref 0.7–4.0)
Lymphs Abs: 0.7 10*3/uL (ref 0.7–4.0)
MCH: 29.7 pg (ref 26.0–34.0)
MCH: 29.4 pg (ref 26.0–34.0)
MCHC: 33 g/dL (ref 30.0–36.0)
MCHC: 33.6 g/dL (ref 30.0–36.0)
MCV: 90 fL (ref 80.0–100.0)
MCV: 87.8 fL (ref 80.0–100.0)
Monocytes Absolute: 0.4 10*3/uL (ref 0.1–1.0)
Monocytes Absolute: 0.3 10*3/uL (ref 0.1–1.0)
Monocytes Relative: 10 %
Monocytes Relative: 5 %
Neutro Abs: 2.3 10*3/uL (ref 1.7–7.7)
Neutro Abs: 5.7 10*3/uL (ref 1.7–7.7)
Neutrophils Relative %: 59 %
Neutrophils Relative %: 85 %
Platelets: 207 10*3/uL (ref 150–400)
Platelets: 235 10*3/uL (ref 150–400)
RBC: 4.61 MIL/uL (ref 4.22–5.81)
RBC: 5.23 MIL/uL (ref 4.22–5.81)
RDW: 13.6 % (ref 11.5–15.5)
RDW: 13.4 % (ref 11.5–15.5)
WBC: 3.8 10*3/uL — ABNORMAL LOW (ref 4.0–10.5)
WBC: 6.7 10*3/uL (ref 4.0–10.5)
nRBC: 0 % (ref 0.0–0.2)
nRBC: 0 % (ref 0.0–0.2)

## 2021-11-21 LAB — COMPREHENSIVE METABOLIC PANEL
ALT: 17 U/L (ref 0–44)
ALT: 16 U/L (ref 0–44)
AST: 18 U/L (ref 15–41)
AST: 22 U/L (ref 15–41)
Albumin: 3.7 g/dL (ref 3.5–5.0)
Albumin: 3.8 g/dL (ref 3.5–5.0)
Alkaline Phosphatase: 71 U/L (ref 38–126)
Alkaline Phosphatase: 72 U/L (ref 38–126)
Anion gap: 5 (ref 5–15)
Anion gap: 11 (ref 5–15)
BUN: 28 mg/dL — ABNORMAL HIGH (ref 8–23)
BUN: 23 mg/dL (ref 8–23)
CO2: 26 mmol/L (ref 22–32)
CO2: 19 mmol/L — ABNORMAL LOW (ref 22–32)
Calcium: 8.6 mg/dL — ABNORMAL LOW (ref 8.9–10.3)
Calcium: 8.6 mg/dL — ABNORMAL LOW (ref 8.9–10.3)
Chloride: 105 mmol/L (ref 98–111)
Chloride: 106 mmol/L (ref 98–111)
Creatinine, Ser: 1.56 mg/dL — ABNORMAL HIGH (ref 0.61–1.24)
Creatinine, Ser: 1.34 mg/dL — ABNORMAL HIGH (ref 0.61–1.24)
GFR, Estimated: 44 mL/min — ABNORMAL LOW (ref >60)
GFR, Estimated: 53 mL/min — ABNORMAL LOW (ref >60)
Glucose, Bld: 166 mg/dL — ABNORMAL HIGH (ref 70–99)
Glucose, Bld: 171 mg/dL — ABNORMAL HIGH (ref 70–99)
Potassium: 4.3 mmol/L (ref 3.5–5.1)
Potassium: 4.2 mmol/L (ref 3.5–5.1)
Sodium: 136 mmol/L (ref 135–145)
Sodium: 136 mmol/L (ref 135–145)
Total Bilirubin: 0.5 mg/dL (ref 0.3–1.2)
Total Bilirubin: 0.5 mg/dL (ref 0.3–1.2)
Total Protein: 7.6 g/dL (ref 6.5–8.1)
Total Protein: 7.5 g/dL (ref 6.5–8.1)

## 2021-11-21 LAB — TROPONIN I (HIGH SENSITIVITY)
Troponin I (High Sensitivity): 17 ng/L (ref <18)
Troponin I (High Sensitivity): 16 ng/L (ref <18)

## 2021-11-21 LAB — MAGNESIUM: Magnesium: 2.1 mg/dL (ref 1.7–2.4)

## 2021-11-21 MED ORDER — PANTOPRAZOLE SODIUM 40 MG IV SOLR
40.0000 mg | INTRAVENOUS | Status: DC
  Administered 2021-11-22 – 2021-11-24 (×4): 40 mg via INTRAVENOUS
  Filled 2021-11-21 (×4): qty 10

## 2021-11-21 MED ORDER — ACETAMINOPHEN 325 MG PO TABS
650.0000 mg | ORAL_TABLET | Freq: Four times a day (QID) | ORAL | Status: DC | PRN
  Administered 2021-11-24 – 2021-11-25 (×3): 650 mg via ORAL
  Filled 2021-11-21 (×3): qty 2

## 2021-11-21 MED ORDER — MORPHINE SULFATE (PF) 4 MG/ML IV SOLN
4.0000 mg | Freq: Once | INTRAVENOUS | Status: AC
  Administered 2021-11-21: 4 mg via INTRAVENOUS
  Filled 2021-11-21: qty 1

## 2021-11-21 MED ORDER — DIPHENHYDRAMINE HCL 50 MG/ML IJ SOLN: 12.5000 mg | Freq: Every evening | INTRAMUSCULAR | Status: DC | PRN

## 2021-11-21 MED ORDER — ONDANSETRON HCL 4 MG/2ML IJ SOLN
4.0000 mg | Freq: Four times a day (QID) | INTRAMUSCULAR | Status: DC | PRN
  Administered 2021-11-22 – 2021-11-26 (×4): 4 mg via INTRAVENOUS
  Filled 2021-11-21 (×4): qty 2

## 2021-11-21 MED ORDER — IOHEXOL 300 MG/ML  SOLN
75.0000 mL | Freq: Once | INTRAMUSCULAR | Status: AC | PRN
  Administered 2021-11-21: 75 mL via INTRAVENOUS

## 2021-11-21 MED ORDER — ENOXAPARIN SODIUM 40 MG/0.4ML IJ SOSY
40.0000 mg | PREFILLED_SYRINGE | INTRAMUSCULAR | Status: DC
  Administered 2021-11-21 – 2021-11-28 (×8): 40 mg via SUBCUTANEOUS
  Filled 2021-11-21 (×8): qty 0.4

## 2021-11-21 MED ORDER — LACTATED RINGERS IV SOLN: INTRAVENOUS | Status: DC

## 2021-11-21 MED ORDER — ACETAMINOPHEN 650 MG RE SUPP: 650.0000 mg | Freq: Four times a day (QID) | RECTAL | Status: DC | PRN

## 2021-11-21 MED ORDER — METOPROLOL TARTRATE 5 MG/5ML IV SOLN
5.0000 mg | Freq: Four times a day (QID) | INTRAVENOUS | Status: AC
  Administered 2021-11-21 – 2021-11-22 (×4): 5 mg via INTRAVENOUS
  Filled 2021-11-21 (×4): qty 5

## 2021-11-21 MED ORDER — INSULIN ASPART 100 UNIT/ML IJ SOLN
0.0000 [IU] | INTRAMUSCULAR | Status: DC
  Administered 2021-11-22: 3 [IU] via SUBCUTANEOUS
  Administered 2021-11-22: 2 [IU] via SUBCUTANEOUS
  Administered 2021-11-22: 3 [IU] via SUBCUTANEOUS
  Administered 2021-11-22 (×2): 2 [IU] via SUBCUTANEOUS
  Administered 2021-11-23: 3 [IU] via SUBCUTANEOUS
  Administered 2021-11-23: 2 [IU] via SUBCUTANEOUS
  Administered 2021-11-23: 3 [IU] via SUBCUTANEOUS
  Administered 2021-11-23: 2 [IU] via SUBCUTANEOUS
  Administered 2021-11-23 – 2021-11-24 (×2): 3 [IU] via SUBCUTANEOUS
  Administered 2021-11-24 (×3): 2 [IU] via SUBCUTANEOUS
  Administered 2021-11-25 – 2021-11-26 (×6): 3 [IU] via SUBCUTANEOUS
  Administered 2021-11-27 (×3): 2 [IU] via SUBCUTANEOUS
  Administered 2021-11-27: 3 [IU] via SUBCUTANEOUS
  Administered 2021-11-28 (×2): 2 [IU] via SUBCUTANEOUS
  Administered 2021-11-28: 3 [IU] via SUBCUTANEOUS
  Administered 2021-11-29: 2 [IU] via SUBCUTANEOUS
  Filled 2021-11-21 (×29): qty 1

## 2021-11-21 MED ORDER — IOHEXOL 300 MG/ML  SOLN
80.0000 mL | Freq: Once | INTRAMUSCULAR | Status: AC | PRN
  Administered 2021-11-21: 80 mL via INTRAVENOUS

## 2021-11-21 MED ORDER — MORPHINE SULFATE (PF) 2 MG/ML IV SOLN
2.0000 mg | INTRAVENOUS | Status: DC | PRN
  Administered 2021-11-22 – 2021-11-23 (×7): 2 mg via INTRAVENOUS
  Filled 2021-11-21 (×7): qty 1

## 2021-11-21 MED ORDER — ONDANSETRON HCL 4 MG PO TABS
4.0000 mg | ORAL_TABLET | Freq: Four times a day (QID) | ORAL | Status: DC | PRN
  Administered 2021-11-25: 4 mg via ORAL
  Filled 2021-11-21: qty 1

## 2021-11-21 MED ORDER — SODIUM CHLORIDE 0.9 % IV BOLUS
1000.0000 mL | Freq: Once | INTRAVENOUS | Status: AC
  Administered 2021-11-21: 1000 mL via INTRAVENOUS

## 2021-11-21 MED ORDER — MORPHINE SULFATE (PF) 4 MG/ML IV SOLN
4.0000 mg | Freq: Once | INTRAVENOUS | Status: AC
  Administered 2021-11-21: 4 mg via INTRAMUSCULAR
  Filled 2021-11-21: qty 1

## 2021-11-21 NOTE — Assessment & Plan Note (Addendum)
SBP over 200 on arrival and could be secondary to pain given improvement after pain meds Metoprolol IV every 6 with hold parameters

## 2021-11-21 NOTE — ED Triage Notes (Signed)
First Nurse Note:  Pt via EMS from home. Pt drank contrast for a procedure, and was discharged. Pt having constipation and vomiting started after when he got home. EMS report '4mg'$  of Zofran IM. EMS stood him up and sat him back down and pt start having syncopal episode. Pt is A&Ox4 and NAD  69 HR  99% on RA 140/90 BP  170 CBG

## 2021-11-21 NOTE — Assessment & Plan Note (Addendum)
Patient had runs of NSVT during his hospitalization in July 2023 IV metoprolol every 6 while n.p.o. Monitor electrolytes to keep potassium above 4 and magnesium over 2 We will get baseline EKG

## 2021-11-21 NOTE — Assessment & Plan Note (Signed)
Sliding scale insulin coverage 

## 2021-11-21 NOTE — ED Notes (Signed)
PIV attempted x3 by 2 different RN's, MD aware.

## 2021-11-21 NOTE — ED Notes (Signed)
Pt in severe ABD pain, MD aware and new orders for IM morphine to be given.

## 2021-11-21 NOTE — ED Provider Notes (Signed)
[facility] Provider Note    Event Date/Time   First MD Initiated Contact with Patient 11/21/21 1523     (approximate)   History   Loss of Consciousness and Nausea   HPI  Willie [name] is a 81 y.o. male   Past medical history of rectal cancer status post resection complicated by history of small bowel obstructions who presents today for nausea and presyncope after receiving a CT scan for cancer screening as an outpatient with oral contrast earlier today.  He felt nauseous and when EMS came to get him to transfer him to the wheelchair he had an episode of presyncope but quickly resolved and is back to baseline at this time.    No output from colostomy bag for 24 hrs. Feels similar to SBO in past  History was obtained via patient and review of external medical notes, including discharge summary from hospitalization in July 2023 for small bowel obstruction medically managed.      Physical Exam   Triage Vital Signs: ED Triage Vitals [11/21/21 1522]  Enc Vitals Group     BP      Pulse Rate (!) 57     Resp 18     Temp      Temp src      SpO2 99 %     Weight      Height      Head Circumference      Peak Flow      Pain Score      Pain Loc      Pain Edu?      Excl. in [place]?     Most recent vital signs: Vitals:   11/21/21 2200 11/21/21 2242  BP: (!) 204/64 (!) 168/103  Pulse: (!) 35 86  Resp: (!) 25 (!) 22  Temp:    SpO2: 97% 92%    General: Awake, no distress.  CV:  Good peripheral perfusion.  Resp:  Normal effort.  Abd:  + distention diffusely tender without rigidity..    ED Results / Procedures / Treatments   Labs (all labs ordered are listed, but only abnormal results are displayed) Labs Reviewed  COMPREHENSIVE METABOLIC PANEL - Abnormal; Notable for the following components:      Result Value   CO2 19 (*)    Glucose, Bld 171 (*)    Creatinine, Ser 1.34 (*)    Calcium 8.6 (*)    GFR, Estimated 53 (*)    All other components  within normal limits  CBC WITH DIFFERENTIAL/PLATELET  MAGNESIUM  HEMOGLOBIN A1C  CBC  BASIC METABOLIC PANEL  MAGNESIUM  TROPONIN I (HIGH SENSITIVITY)  TROPONIN I (HIGH SENSITIVITY)     I reviewed labs and they are notable for creatinine 1.34  EKG  ED ECG REPORT I, Lucillie Garfinkel, the attending physician, personally viewed and interpreted this ECG.   Date: 11/21/2021  EKG Time: 1525  Rate: 97  Rhythm: normal sinus rhythm, frequent PVC's noted  Axis: normal  Intervals:no STEMI  RADIOLOGY I independently reviewed and interpreted CT abd and see enlarged bowel loops concerning for obstruction  PROCEDURES:  Critical Care performed: No  Procedures   MEDICATIONS ORDERED IN ED: Medications  metoprolol tartrate (LOPRESSOR) injection 5 mg (5 mg Intravenous Given 11/21/21 2228)  pantoprazole (PROTONIX) injection 40 mg (has no administration in time range)  enoxaparin (LOVENOX) injection 40 mg (40 mg Subcutaneous Not Given 11/21/21 2305)  acetaminophen (TYLENOL) tablet 650 mg (has no administration in time  range)    Or  acetaminophen (TYLENOL) suppository 650 mg (has no administration in time range)  ondansetron (ZOFRAN) tablet 4 mg (has no administration in time range)    Or  ondansetron (ZOFRAN) injection 4 mg (has no administration in time range)  insulin aspart (novoLOG) injection 0-15 Units ( Subcutaneous Not Given 11/21/21 2306)  lactated ringers infusion (has no administration in time range)  morphine (PF) 2 MG/ML injection 2 mg (has no administration in time range)  diphenhydrAMINE (BENADRYL) injection 12.5 mg (has no administration in time range)  sodium chloride 0.9 % bolus 1,000 mL (0 mLs Intravenous Stopped 11/21/21 2043)  morphine (PF) 4 MG/ML injection 4 mg (4 mg Intramuscular Given 11/21/21 1638)  morphine (PF) 4 MG/ML injection 4 mg (4 mg Intravenous Given 11/21/21 1845)  iohexol (OMNIPAQUE) 300 MG/ML solution 75 mL (75 mLs Intravenous Contrast Given 11/21/21 1940)   morphine (PF) 4 MG/ML injection 4 mg (4 mg Intravenous Given 11/21/21 2129)    Consultants:  I spoke with general surgery Dr. [name] regarding care plan for this patient.   IMPRESSION / MDM / ASSESSMENT AND PLAN / ED COURSE  I reviewed the triage vital signs and the nursing notes.                              Differential diagnosis includes, but is not limited to, small bowel obstruction, abdominal infection, electrolyte derangement, dehydration; for syncope check cardiac with troponins and EKG, vasovagal, orthostatic.   The patient is on the cardiac monitor to evaluate for evidence of arrhythmia and/or significant heart rate changes.  MDM:   ET scan shows evidence of partial small bowel obstruction.  Patient is not actively vomiting in the emergency department.  NG tube placed.  Surgery consulted, admitted, n.p.o.  Patient's presentation is most consistent with acute presentation with potential threat to life or bodily function.       FINAL CLINICAL IMPRESSION(S) / ED DIAGNOSES   Final diagnoses:  Small bowel obstruction ([place])     Rx / DC Orders   ED Discharge Orders     None        Note:  This document was prepared using Dragon voice recognition software and may include unintentional dictation errors.    Lucillie Garfinkel, MD 11/21/21 [phone]

## 2021-11-21 NOTE — Assessment & Plan Note (Addendum)
History of recurrent SBO Possible enteritis CT abdomen and pelvis with contrast showing:Interval worsening of small bowel dilation in lower abdomen and pelvis.This may be due to adhesions... interval appearance of small ascites which may be due to small-bowel obstruction...wall thickening in some of the dilated distal small bowel loops suggesting possible enteritis. NG tube and keep n.p.o., per surgery recommendations per conversation with ED provider Pain control, IV antiemetics, IV fluids and supportive care Surgery consult to follow

## 2021-11-21 NOTE — ED Notes (Signed)
Pt presents to ED with c/o of having ABD pain after contrast dye to monitor his colon CA, pt states he has had this before and states he has never had any issues.   Pt states he has a HX of bowel blockage, pt does have colostomy bag due to cancer.   Per triage RN pt had a syncopal episode while moving from EMS stretcher to ED wheelchair, pt denies any injury or trauma.   Pt has equal BLE grips and no visual disturbances noted. Pt is A&Ox4.

## 2021-11-21 NOTE — Assessment & Plan Note (Addendum)
No complaints of chest pain.  EKG WNL We will get baseline EKG Holding home oral meds until trial of clears

## 2021-11-21 NOTE — Assessment & Plan Note (Signed)
Mild metabolic acidosis Creatinine on arrival 1.56, above normal baseline of 1.09 on 7/19, associated with bicarb of 19 IV fluids, and monitor renal function

## 2021-11-21 NOTE — H&P (Signed)
History and Physical    Patient: Willie [name] ZWC:[id number] DOB: 05/29/1940 DOA: 11/21/2021 DOS: the patient was seen and examined on 11/21/2021 PCP: [name], MD  Patient coming from: Home  Chief Complaint:  Chief Complaint  Patient presents with   Loss of Consciousness   Nausea    HPI: Willie [name] is a 81 y.o. male with medical history significant for DM, HTN, CAD, rectal cancer s/p colectomy, chemotherapy and XRT, with colostomy bag, prior admissions for small bowel obstruction most recently from 7/16 to 09/27/2021 who now presents to the ED with a 24-hour history of abdominal distention and no output through colostomy bag, and associated abdominal pain.  Patient went for routine surveillance CT abdomen and pelvis with contrast earlier in the day developed abdominal pain and vomiting Thus prompting the visit to the ED, but states that there has been no output in the bag for the past 24 hours.  Nephew at the bedside contributes to the history. ED course and data review: On arrival afebrile with pulse of 57 respirations 18 and BP 210/62 with O2 sat 99% on room air.  While in the ED he did become tachycardic to 107 and tachypneic to the mid to high 20s at the time of admission though BP did improve to SBP in the 160s.  Labs significant for creatinine of 1.56-1.34, up from normal baseline of 1.09 a month prior on 09-26-2021.  Labs otherwise unremarkable, including CMP and troponin to blood sugar slightly elevated at 171 and bicarb 19. CTA abdomen and pelvis with contrast showing the following: IMPRESSION: There is interval worsening of small bowel dilation in lower abdomen and pelvis. There is wall thickening in some of the dilated small bowel loops. There is fecalization in distal ileum with change in caliber in right lower abdomen. Findings suggest partial small bowel obstruction with interval worsening. This may be due to adhesions. There is interval appearance of small ascites  which may be due to small-bowel obstruction. There is wall thickening in some of the dilated distal small bowel loops suggesting possible enteritis.   There is no hydronephrosis. Appendix is not dilated. Diverticulosis of colon. 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   Other findings as described in the body of the report.   Patient treated with IV morphine for pain and a NS bolus.  The ED provider spoke with surgeon, Dr. [name] who recommended an NG tube and keep n.p.o. and will evaluate in the a.m.  Hospitalist subsequently consulted for admission.   Review of Systems: As mentioned in the history of present illness. All other systems reviewed and are negative.  Past Medical History:  Diagnosis Date   Anemia    Coronary artery disease    Diabetes mellitus without complication (HCC)    GERD (gastroesophageal reflux disease)    History of hiatal hernia    Hypercholesterolemia    Hypertension    Prostate cancer ([place]) 2000   Prostatectomy.    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[place]) 09/2018   Chemo tx's.    Wears dentures    partial lower   Past Surgical History:  Procedure Laterality Date   CATARACT EXTRACTION W/PHACO Right 10/12/2019   Procedure: CATARACT EXTRACTION PHACO AND INTRAOCULAR LENS PLACEMENT (IOC) RIGHT DIABETIC 6.35 00:37.1;  Surgeon: Birder Robson, MD;  Location: [place];  Service: Ophthalmology;  Laterality: Right;   COLONOSCOPY N/A 05/02/2021   Procedure: COLONOSCOPY;  Surgeon: [name], [name], MD;  Location: [facility] ENDOSCOPY;  Service: Gastroenterology;  Laterality: N/A;  IDDM   COLONOSCOPY WITH PROPOFOL N/A 07/15/2018   Procedure: COLONOSCOPY WITH PROPOFOL;  Surgeon: [name], [name], MD;  Location: [facility] ENDOSCOPY;  Service: Gastroenterology;  Laterality: N/A;   ESOPHAGOGASTRODUODENOSCOPY N/A 10/12/2018   Procedure: ESOPHAGOGASTRODUODENOSCOPY (EGD);  Surgeon: [name], MD;  [facility]COPY;  Service: Gastroenterology;  Laterality:  N/A;   ESOPHAGOGASTRODUODENOSCOPY (EGD) WITH PROPOFOL N/A 07/15/2018   Procedure: ESOPHAGOGASTRODUODENOSCOPY (EGD) WITH PROPOFOL;  Surgeon: [name], [name], MD;  Location: [facility] ENDOSCOPY;  Service: Gastroenterology;  Laterality: N/A;   PORTACATH PLACEMENT N/A 08/13/2018   Procedure: INSERTION PORT-A-CATH;  Surgeon: [name], MD;  Location: [facility] ORS;  Service: General;  Laterality: N/A;   [place] PARTIAL COLECTOMY  05/12/2019   [place]   Social History:  reports that he has never smoked. He has never used smokeless tobacco. He reports that he does not currently use alcohol. He reports that he does not use drugs.  No Known Allergies  Family History  Problem Relation Age of Onset   Diabetes Mother    Ovarian cancer Sister     Prior to Admission medications   Medication Sig Start Date End Date Taking? Authorizing Provider  aspirin EC 81 MG tablet Take 81 mg by mouth daily.    [provider]  brimonidine (ALPHAGAN) 0.2 % ophthalmic solution Place 1 drop into both eyes 2 (two) times daily.    [provider]  lisinopril (ZESTRIL) 10 MG tablet Take 10 mg by mouth daily.    [provider]  metoprolol succinate (TOPROL-XL) 50 MG 24 hr tablet Take 50 mg by mouth daily.    [provider]  Netarsudil-Latanoprost 0.02-0.005 % SOLN Place 1 drop into both eyes at bedtime.    [provider]  NOVOLOG MIX 70/30 FLEXPEN (70-30) 100 UNIT/ML FlexPen Inject 12 Units into the skin 2 (two) times daily with a meal.    [provider]  pantoprazole (PROTONIX) 40 MG tablet Take 1 tablet (40 mg total) by mouth 2 (two) times daily before a meal. Patient taking differently: Take 40 mg by mouth daily. 10/19/18   Sindy Guadeloupe, MD  rosuvastatin (CRESTOR) 10 MG tablet Take 10 mg by mouth daily.    [provider]  traMADol (ULTRAM) 50 MG tablet Take 1 tablet (50 mg total) by mouth every 6 (six) hours as  needed. 09/19/21   Harvest Dark, MD  traZODone (DESYREL) 50 MG tablet Take 50 mg by mouth at bedtime as needed for sleep.     [provider]  prochlorperazine (COMPAZINE) 10 MG tablet TAKE 1 TABLET BY MOUTH EVERY 6 HOURS AS NEEDED NAUSEA AND VOMITING 11/06/18 12/28/18  Sindy Guadeloupe, MD    Physical Exam: Vitals:   11/21/21 1830 11/21/21 1900 11/21/21 2000 11/21/21 2030  BP: (!) 194/84 (!) 157/46 (!) 167/99 (!) 148/64  Pulse: (!) 34 (!) 34 (!) 37 (!) 35  Resp: (!) 23 (!) 34 16 (!) 29  Temp:   98.4 F (36.9 C)   TempSrc:      SpO2: 97% 90% 96% 95%   Physical Exam Vitals and nursing note reviewed.  Constitutional:      General: He is not in acute distress.    Comments: Frail-appearing, NG tube in place.  HENT:     Head: Normocephalic and atraumatic.  Cardiovascular:     Rate and Rhythm: Normal rate and regular rhythm.     Heart sounds: Normal heart  sounds.     Comments: Heart rate between 100-110 Pulmonary:     Effort: Pulmonary effort is normal.     Breath sounds: Normal breath sounds.  Abdominal:     Palpations: Abdomen is soft.     Tenderness: There is no abdominal tenderness.     Comments: Mild distention.  Colostomy bag mostly empty.  Neurological:     Mental Status: Mental status is at baseline.     Labs on Admission: I have personally reviewed following labs and imaging studies  CBC: Recent Labs  Lab 11/21/21 0939 11/21/21 1546  WBC 3.8* 6.7  NEUTROABS 2.3 5.7  HGB 13.7 15.4  HCT 41.5 45.9  MCV 90.0 87.8  PLT 207 673   Basic Metabolic Panel: Recent Labs  Lab 11/21/21 0939 11/21/21 1824  NA 136 136  K 4.3 4.2  CL 105 106  CO2 26 19*  GLUCOSE 166* 171*  BUN 28* 23  CREATININE 1.56* 1.34*  CALCIUM 8.6* 8.6*   GFR: CrCl cannot be calculated (Unknown ideal weight.). Liver Function Tests: Recent Labs  Lab 11/21/21 0939 11/21/21 1824  AST 18 22  ALT 17 16  ALKPHOS 71 72  BILITOT 0.5 0.5  PROT 7.6 7.5  ALBUMIN 3.7 3.8   No  results for input(s): "LIPASE", "AMYLASE" in the last 168 hours. No results for input(s): "AMMONIA" in the last 168 hours. Coagulation Profile: No results for input(s): "INR", "PROTIME" in the last 168 hours. Cardiac Enzymes: No results for input(s): "CKTOTAL", "CKMB", "CKMBINDEX", "TROPONINI" in the last 168 hours. BNP (last 3 results) No results for input(s): "PROBNP" in the last 8760 hours. HbA1C: No results for input(s): "HGBA1C" in the last 72 hours. CBG: No results for input(s): "GLUCAP" in the last 168 hours. Lipid Profile: No results for input(s): "CHOL", "HDL", "LDLCALC", "TRIG", "CHOLHDL", "LDLDIRECT" in the last 72 hours. Thyroid Function Tests: No results for input(s): "TSH", "T4TOTAL", "FREET4", "T3FREE", "THYROIDAB" in the last 72 hours. Anemia Panel: No results for input(s): "VITAMINB12", "FOLATE", "FERRITIN", "TIBC", "IRON", "RETICCTPCT" in the last 72 hours. Urine analysis:    Component Value Date/Time   COLORURINE YELLOW (A) 09/23/2021 0521   APPEARANCEUR HAZY (A) 09/23/2021 0521   LABSPEC 1.020 09/23/2021 0521   PHURINE 5.0 09/23/2021 0521   GLUCOSEU NEGATIVE 09/23/2021 0521   HGBUR SMALL (A) 09/23/2021 0521   BILIRUBINUR NEGATIVE 09/23/2021 0521   KETONESUR 20 (A) 09/23/2021 0521   PROTEINUR 100 (A) 09/23/2021 0521   NITRITE NEGATIVE 09/23/2021 0521   LEUKOCYTESUR NEGATIVE 09/23/2021 0521    Radiological Exams on Admission: CT Abdomen Pelvis W Contrast  Result Date: 11/21/2021 CLINICAL DATA:  Abdominal pain, possible bowel obstruction EXAM: CT ABDOMEN AND PELVIS WITH CONTRAST TECHNIQUE: Multidetector CT imaging of the abdomen and pelvis was performed using the standard protocol following bolus administration of intravenous contrast. RADIATION DOSE REDUCTION: This exam was performed according to the departmental dose-optimization program which includes automated exposure control, adjustment of the mA and/or kV according to patient size and/or use of iterative  reconstruction technique. CONTRAST:  5m OMNIPAQUE IOHEXOL 300 MG/ML  SOLN COMPARISON:  Previous studies including the examination done earlier today FINDINGS: Lower chest: Heart is enlarged in size. There is no focal consolidation in the visualized lower lung fields. Hepatobiliary: Liver is not enlarged. There is no dilation of bile ducts. There is high density in the lumen of gallbladder, most likely due to vicarious contrast excretion in the bile from previous CT. Pancreas: No focal abnormalities are seen. Spleen: Spleen is  not enlarged. Adrenals/Urinary Tract: Adrenals are unremarkable. There is no hydronephrosis. There is 1.6 cm cyst in the lower pole of right kidney. Ureters are not dilated. There is contrast in the collecting systems and bladder from previous CT. Stomach/Bowel: Small hiatal hernia is seen. Stomach is unremarkable. Proximal small bowel loops are not dilated. There is dilation of distal small bowel loops in lower abdomen and pelvis measuring up to 3.3 cm in diameter. There is fecalization in ileal loop slightly to the right of midline. There is change in caliber of small bowel loops in right lower abdomen with decompression of distal and terminal ileum. There is wall thickening in some of the dilated small bowel loops in lower abdomen. Appendix is not dilated. Numerous diverticula are seen in colon. There is transverse colostomy in left periumbilical region. Left colon is not seen and appears to have been removed. Vascular/Lymphatic: Scattered arterial calcifications are seen. No new significant lymphadenopathy is seen. Reproductive: Prostate is not seen. Other: There is interval appearance of small ascites. There is no pneumoperitoneum. There is fluid density in the right inguinal canal. Right inguinal hernia containing fat is seen. Musculoskeletal: Degenerative changes are noted in lower thoracic spine and lumbar spine. IMPRESSION: There is interval worsening of small bowel dilation in lower  abdomen and pelvis. There is wall thickening in some of the dilated small bowel loops. There is fecalization in distal ileum with change in caliber in right lower abdomen. Findings suggest partial small bowel obstruction with interval worsening. This may be due to adhesions. There is interval appearance of small ascites which may be due to small-bowel obstruction. There is wall thickening in some of the dilated distal small bowel loops suggesting possible enteritis. There is no hydronephrosis. Appendix is not dilated. Diverticulosis of colon. 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 Other findings as described in the body of the report. Electronically Signed   By: Elmer Picker M.D.   On: 11/21/2021 20:13   CT CHEST ABDOMEN PELVIS W CONTRAST  Result Date: 11/21/2021 CLINICAL DATA:  Rectal cancer, restaging EXAM: CT CHEST, ABDOMEN, AND PELVIS WITH CONTRAST TECHNIQUE: Multidetector CT imaging of the chest, abdomen and pelvis was performed following the standard protocol during bolus administration of intravenous contrast. RADIATION DOSE REDUCTION: This exam was performed according to the departmental dose-optimization program which includes automated exposure control, adjustment of the mA and/or kV according to patient size and/or use of iterative reconstruction technique. CONTRAST:  65m OMNIPAQUE IOHEXOL 300 MG/ML  SOLN COMPARISON:  CT abdomen and pelvis 09/23/2021.  CT chest 05/17/2021. FINDINGS: CT CHEST FINDINGS Cardiovascular: Heart is normal size. Aorta is normal caliber. Coronary artery and aortic calcifications. Mediastinum/Nodes: No mediastinal, hilar, or axillary adenopathy. Trachea and esophagus are unremarkable. Thyroid unremarkable. Lungs/Pleura: Lungs are clear. No focal airspace opacities or suspicious nodules. No effusions. Musculoskeletal: Chest wall soft tissues are unremarkable. No acute bony abnormality. CT ABDOMEN PELVIS FINDINGS Hepatobiliary: No focal hepatic  abnormality. Gallbladder unremarkable. Pancreas: No focal abnormality or ductal dilatation. Spleen: No focal abnormality.  Normal size. Adrenals/Urinary Tract: No adrenal abnormality. No suspicious focal renal abnormality. No stones or hydronephrosis. Urinary bladder is unremarkable. Stomach/Bowel: Prior partial colectomy with Hartmann's pouch and left lower quadrant ostomy. Again noted are mildly prominent pelvic small bowel loops, unchanged since prior study. Colonic diverticulosis. Normal appendix. Stomach unremarkable. Vascular/Lymphatic: Aortic atherosclerosis. No evidence of aneurysm or adenopathy. Reproductive: No visible focal abnormality.  Prior prostatectomy. Other: No free fluid or free air. Musculoskeletal: No acute bony abnormality. IMPRESSION:  No acute findings or evidence of metastatic disease in the chest, abdomen or pelvis. Stable postoperative changes with left lower quadrant ostomy. Coronary artery disease, aortic atherosclerosis. Electronically Signed   By: Rolm Baptise M.D.   On: 11/21/2021 17:08     Data Reviewed: Relevant notes from primary care and specialist visits, past discharge summaries as available in EHR, including Care Everywhere. Prior diagnostic testing as pertinent to current admission diagnoses Updated medications and problem lists for reconciliation ED course, including vitals, labs, imaging, treatment and response to treatment Triage notes, nursing and pharmacy notes and ED provider's notes Notable results as noted in HPI   Assessment and Plan: * Partial small bowel obstruction (HCC) History of recurrent SBO Possible enteritis CT abdomen and pelvis with contrast showing:Interval worsening of small bowel dilation in lower abdomen and pelvis.This may be due to adhesions... interval appearance of small ascites which may be due to small-bowel obstruction...wall thickening in some of the dilated distal small bowel loops suggesting possible enteritis. NG tube and  keep n.p.o., per surgery recommendations per conversation with ED provider Pain control, IV antiemetics, IV fluids and supportive care Surgery consult to follow  AKI (acute kidney injury) ([place]) Mild metabolic acidosis Creatinine on arrival 1.56, above normal baseline of 1.09 on 7/19, associated with bicarb of 19 IV fluids, and monitor renal function  Hypertensive urgency SBP over 200 on arrival and could be secondary to pain given improvement after pain meds Metoprolol IV every 6 with hold parameters  Rectal cancer [facility]) S/p colectomy, chemotherapy and XRT Colostomy status  Followed by oncology Colonoscopy 04/2021 for colon cancer surveillance: "evidence of a widely patent end colostomy in the descending colon...healthy appearing mucosa.Marland KitchenMarland KitchenMany small and large-mouthed diverticula were found in the entire colon". Colostomy care  History of NSVT (nonsustained ventricular tachycardia) (HCC) Patient had runs of NSVT during his hospitalization in July 2023 IV metoprolol every 6 while n.p.o. Monitor electrolytes to keep potassium above 4 and magnesium over 2 We will get baseline EKG  CAD (coronary artery disease) No complaints of chest pain.  EKG WNL We will get baseline EKG Holding home oral meds until trial of clears  Controlled type 2 diabetes mellitus without complication, with long-term current use of insulin (HCC) Sliding scale insulin coverage    DVT prophylaxis: Lovenox  Consults: Surgery, Dr [name]  Advance Care Planning:   Code Status: Prior   Family Communication: none  Disposition Plan: Back to previous home environment  Severity of Illness: The appropriate patient status for this patient is INPATIENT. Inpatient status is judged to be reasonable and necessary in order to provide the required intensity of service to ensure the patient's safety. The patient's presenting symptoms, physical exam findings, and initial radiographic and laboratory data in the context of  their chronic comorbidities is felt to place them at high risk for further clinical deterioration. Furthermore, it is not anticipated that the patient will be medically stable for discharge from the hospital within 2 midnights of admission.   * I certify that at the point of admission it is my clinical judgment that the patient will require inpatient hospital care spanning beyond 2 midnights from the point of admission due to high intensity of service, high risk for further deterioration and high frequency of surveillance required.*  Author: Athena Masse, MD 11/21/2021 9:18 PM  For on call review [email or web address].

## 2021-11-21 NOTE — Assessment & Plan Note (Addendum)
S/p colectomy, chemotherapy and XRT Colostomy status  Followed by oncology Colonoscopy 04/2021 for colon cancer surveillance: "evidence of a widely patent end colostomy in the descending colon...healthy appearing mucosa.Marland KitchenMarland KitchenMany small and large-mouthed diverticula were found in the entire colon". Colostomy care

## 2021-11-22 ENCOUNTER — Encounter: Payer: Self-pay | Admitting: Internal Medicine

## 2021-11-22 ENCOUNTER — Other Ambulatory Visit: Payer: Self-pay

## 2021-11-22 ENCOUNTER — Inpatient Hospital Stay: Payer: Medicare PPO

## 2021-11-22 DIAGNOSIS — N179 Acute kidney failure, unspecified: Secondary | ICD-10-CM | POA: Diagnosis not present

## 2021-11-22 DIAGNOSIS — Z933 Colostomy status: Secondary | ICD-10-CM

## 2021-11-22 DIAGNOSIS — I160 Hypertensive urgency: Secondary | ICD-10-CM

## 2021-11-22 DIAGNOSIS — E119 Type 2 diabetes mellitus without complications: Secondary | ICD-10-CM | POA: Diagnosis not present

## 2021-11-22 DIAGNOSIS — Z794 Long term (current) use of insulin: Secondary | ICD-10-CM

## 2021-11-22 DIAGNOSIS — K56600 Partial intestinal obstruction, unspecified as to cause: Secondary | ICD-10-CM | POA: Diagnosis not present

## 2021-11-22 DIAGNOSIS — I4729 Other ventricular tachycardia: Secondary | ICD-10-CM

## 2021-11-22 DIAGNOSIS — C20 Malignant neoplasm of rectum: Secondary | ICD-10-CM

## 2021-11-22 LAB — BASIC METABOLIC PANEL
Anion gap: 8 (ref 5–15)
BUN: 22 mg/dL (ref 8–23)
CO2: 22 mmol/L (ref 22–32)
Calcium: 8.4 mg/dL — ABNORMAL LOW (ref 8.9–10.3)
Chloride: 108 mmol/L (ref 98–111)
Creatinine, Ser: 1.29 mg/dL — ABNORMAL HIGH (ref 0.61–1.24)
GFR, Estimated: 55 mL/min — ABNORMAL LOW (ref >60)
Glucose, Bld: 160 mg/dL — ABNORMAL HIGH (ref 70–99)
Potassium: 4.3 mmol/L (ref 3.5–5.1)
Sodium: 138 mmol/L (ref 135–145)

## 2021-11-22 LAB — HEMOGLOBIN A1C
Hgb A1c MFr Bld: 7.4 % — ABNORMAL HIGH (ref 4.8–5.6)
Mean Plasma Glucose: 165.68 mg/dL

## 2021-11-22 LAB — GLUCOSE, CAPILLARY
Glucose-Capillary: 117 mg/dL — ABNORMAL HIGH (ref 70–99)
Glucose-Capillary: 126 mg/dL — ABNORMAL HIGH (ref 70–99)
Glucose-Capillary: 127 mg/dL — ABNORMAL HIGH (ref 70–99)
Glucose-Capillary: 136 mg/dL — ABNORMAL HIGH (ref 70–99)
Glucose-Capillary: 164 mg/dL — ABNORMAL HIGH (ref 70–99)
Glucose-Capillary: 196 mg/dL — ABNORMAL HIGH (ref 70–99)
Glucose-Capillary: 92 mg/dL (ref 70–99)

## 2021-11-22 LAB — CBC
HCT: 42.3 % (ref 39.0–52.0)
Hemoglobin: 14.1 g/dL (ref 13.0–17.0)
MCH: 28.8 pg (ref 26.0–34.0)
MCHC: 33.3 g/dL (ref 30.0–36.0)
MCV: 86.5 fL (ref 80.0–100.0)
Platelets: 235 10*3/uL (ref 150–400)
RBC: 4.89 MIL/uL (ref 4.22–5.81)
RDW: 13.8 % (ref 11.5–15.5)
WBC: 4.5 10*3/uL (ref 4.0–10.5)
nRBC: 0 % (ref 0.0–0.2)

## 2021-11-22 LAB — MAGNESIUM: Magnesium: 2.1 mg/dL (ref 1.7–2.4)

## 2021-11-22 LAB — CEA: CEA: <0.6 ng/mL (ref 0.0–4.7)

## 2021-11-22 MED ORDER — ORAL CARE MOUTH RINSE: 15.0000 mL | OROMUCOSAL | Status: DC | PRN

## 2021-11-22 MED ORDER — DIATRIZOATE MEGLUMINE & SODIUM 66-10 % PO SOLN
90.0000 mL | Freq: Once | ORAL | Status: AC
  Administered 2021-11-22: 90 mL via NASOGASTRIC

## 2021-11-22 MED ORDER — BRIMONIDINE TARTRATE 0.2 % OP SOLN
1.0000 [drp] | Freq: Two times a day (BID) | OPHTHALMIC | Status: DC
  Administered 2021-11-22 – 2021-11-29 (×15): 1 [drp] via OPHTHALMIC
  Filled 2021-11-22: qty 5

## 2021-11-22 MED ORDER — NETARSUDIL-LATANOPROST 0.02-0.005 % OP SOLN
1.0000 [drp] | Freq: Every day | OPHTHALMIC | Status: DC
  Administered 2021-11-22 – 2021-11-28 (×7): 1 [drp] via OPHTHALMIC
  Filled 2021-11-22: qty 2.5

## 2021-11-22 MED ORDER — PNEUMOCOCCAL 20-VAL CONJ VACC 0.5 ML IM SUSY
0.5000 mL | PREFILLED_SYRINGE | INTRAMUSCULAR | Status: AC
  Administered 2021-11-25: 0.5 mL via INTRAMUSCULAR
  Filled 2021-11-22: qty 0.5

## 2021-11-22 MED ORDER — LABETALOL HCL 5 MG/ML IV SOLN
10.0000 mg | INTRAVENOUS | Status: DC | PRN
  Administered 2021-11-22: 10 mg via INTRAVENOUS
  Filled 2021-11-22: qty 4

## 2021-11-22 MED ORDER — INFLUENZA VAC A&B SA ADJ QUAD 0.5 ML IM PRSY
0.5000 mL | PREFILLED_SYRINGE | INTRAMUSCULAR | Status: AC
  Administered 2021-11-25: 0.5 mL via INTRAMUSCULAR
  Filled 2021-11-22 (×3): qty 0.5

## 2021-11-22 NOTE — Consult Note (Signed)
SURGICAL CONSULTATION NOTE   HISTORY OF PRESENT ILLNESS (HPI):  81 y.o. male presented to [facility] ED for evaluation of abdominal pain. Patient reports he was feeling good until he had a CT scan that was done for oncologic surveillance purposes.  He endorses that after drinking the contrast he started feeling abdominal pain.  Pain was general, no pain radiation.  Aggravating factor was drinking the contrast.  No alleviating factors.  Patient endorses nausea but no vomiting.  He endorses decreased ostomy output.  Initial surveillance CT chest abdomen and pelvis shows no bowel obstruction but repeated CT scan of the of the abdomen and pelvis shows worsening small bowel dilation with fecalization.  There was no free air or free fluid.  I personally evaluated the images.  Labs shows no leukocytosis.  This morning the patient endorses that he is feeling much better.  He is still not passing gas through the colostomy.  Denies any nausea at this moment.  He feels that the aggravating factor was drinking the contrast.  Upon chart review patient was admitted about 51-monthago with similar episode of generalized abdominal pain with suspected bowel obstruction.  Treated medically and resolved.  Surgery is consulted by Dr. WJacelyn Gripin this context for evaluation and management of partial small bowel obstruction.  PAST MEDICAL HISTORY (PMH):  Past Medical History:  Diagnosis Date   Anemia    Coronary artery disease    Diabetes mellitus without complication (HCC)    GERD (gastroesophageal reflux disease)    History of hiatal hernia    Hypercholesterolemia    Hypertension    [place]   [place].    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   Chemo tx's.    Wears dentures    partial lower     PAST SURGICAL HISTORY ([facility]:  Past Surgical History:  Procedure Laterality Date   CATARACT EXTRACTION W/PHACO Right 10/12/2019   Procedure: CATARACT EXTRACTION PHACO AND INTRAOCULAR LENS PLACEMENT (IOC)  RIGHT DIABETIC 6.35 00:37.1;  Surgeon: PBirder Robson MD;  [place]  Service: Ophthalmology;  Laterality: Right;   COLONOSCOPY N/A 05/02/2021   Procedure: COLONOSCOPY;  Surgeon: [name], TBenay Pike MD;  Location: [facility] ENDOSCOPY;  Service: Gastroenterology;  Laterality: N/A;  IDDM   COLONOSCOPY WITH PROPOFOL N/A 07/15/2018   Procedure: COLONOSCOPY WITH PROPOFOL;  Surgeon: [name], TBenay Pike MD;  Location: [facility] ENDOSCOPY;  Service: Gastroenterology;  Laterality: N/A;   ESOPHAGOGASTRODUODENOSCOPY N/A 10/12/2018   Procedure: ESOPHAGOGASTRODUODENOSCOPY (EGD);  Surgeon: VLin Landsman MD;  [facility]  Service: Gastroenterology;  Laterality: N/A;   ESOPHAGOGASTRODUODENOSCOPY (EGD) WITH PROPOFOL N/A 07/15/2018   Procedure: ESOPHAGOGASTRODUODENOSCOPY (EGD) WITH PROPOFOL;  Surgeon: [name], TBenay Pike MD;  Location: [facility] ENDOSCOPY;  Service: Gastroenterology;  Laterality: N/A;   PORTACATH PLACEMENT N/A 08/13/2018   Procedure: INSERTION PORT-A-CATH;  Surgeon: CHerbert Pun MD;  Location: [facility] ORS;  Service: General;  Laterality: N/A;   PROSTATE SURGERY     ROBOT ASSISTED LAPAROSCOPIC PARTIAL COLECTOMY  05/12/2019   [place]     MEDICATIONS:  Prior to Admission medications   Medication Sig Start Date End Date Taking? Authorizing Provider  aspirin EC 81 MG tablet Take 81 mg by mouth 3 (three) times a week. Tues weds, thurs   Yes [provider]  brimonidine (ALPHAGAN) 0.2 % ophthalmic solution Place 1 drop into both eyes 2 (two) times daily.   Yes [provider]  lisinopril (ZESTRIL) 10 MG tablet Take 10 mg by mouth daily.   Yes [provider]  metoprolol succinate (TOPROL-XL) 50 MG 24 hr tablet Take 50 mg by mouth daily.   Yes [provider]  Netarsudil-Latanoprost 0.02-0.005 % SOLN Place 1 drop into both eyes at bedtime.   Yes [provider]  NOVOLOG MIX 70/30 FLEXPEN (70-30) 100 UNIT/ML FlexPen Inject 12 Units into  the skin 2 (two) times daily with a meal.   Yes [provider]  pantoprazole (PROTONIX) 40 MG tablet Take 1 tablet (40 mg total) by mouth 2 (two) times daily before a meal. Patient taking differently: Take 40 mg by mouth daily. 10/19/18  Yes Sindy Guadeloupe, MD  rosuvastatin (CRESTOR) 10 MG tablet Take 10 mg by mouth daily.   Yes [provider]  traZODone (DESYREL) 50 MG tablet Take 50 mg by mouth at bedtime as needed for sleep.    Yes [provider]  traMADol (ULTRAM) 50 MG tablet Take 1 tablet (50 mg total) by mouth every 6 (six) hours as needed. 09/19/21   Harvest Dark, MD  prochlorperazine (COMPAZINE) 10 MG tablet TAKE 1 TABLET BY MOUTH EVERY 6 HOURS AS NEEDED NAUSEA AND VOMITING 11/06/18 12/28/18  Sindy Guadeloupe, MD     ALLERGIES:  No Known Allergies   SOCIAL HISTORY:  Social History   Socioeconomic History   Marital status: Widowed    Spouse name: Not on file   Number of children: 0   Years of education: Not on file   Highest education level: Not on file  Occupational History   Occupation: Pharmacist, hospital    Comment: Retired  Tobacco Use   Smoking status: Never   Smokeless tobacco: Never  Vaping Use   Vaping Use: Never used  Substance and Sexual Activity   Alcohol use: Not Currently   Drug use: Never   Sexual activity: Not Currently  Other Topics Concern   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  His wife was a Camera operator and they traveled frequently.  Several nieces and nephews.  He is the youngest of his brothers and sisters and only surviving.   Social Determinants of Health   Financial Resource Strain: Low Risk  (11/01/2017)   Overall Financial Resource Strain (CARDIA)    Difficulty of Paying Living Expenses: Not hard at all  Food Insecurity: No Food Insecurity (11/22/2021)   Hunger Vital Sign     Worried About Running Out of Food in the Last Year: Never true    Ran Out of Food in the Last Year: Never true  Transportation Needs: No Transportation Needs (11/22/2021)   PRAPARE - Hydrologist (Medical): No    Lack of Transportation (Non-Medical): No  Physical Activity: Insufficiently Active (11/01/2017)   Exercise Vital Sign    Days of Exercise per Week: 2 days    Minutes of Exercise per Session: 30 min  Stress: No Stress Concern Present (11/01/2017)   [place]    Feeling of Stress : Only a little  Social Connections: Unknown (08/31/2018)   Social Connection and Isolation Panel [NHANES]    Frequency of Communication with Friends and Family: More than three times a week    Frequency of Social Gatherings with Friends and Family: More than three times a week    Attends Religious Services: Not on file    Active Member  of Clubs or Organizations: Not on file    Attends Club or Organization Meetings: Not on file    Marital Status: Not on file  Intimate Partner Violence: Not At Risk (11/22/2021)   Humiliation, Afraid, Rape, and Kick questionnaire    Fear of Current or Ex-Partner: No    Emotionally Abused: No    Physically Abused: No    Sexually Abused: No      FAMILY HISTORY:  Family History  Problem Relation Age of Onset   Diabetes Mother    Ovarian cancer Sister      REVIEW OF SYSTEMS:  Constitutional: denies weight loss, fever, chills, or sweats  Eyes: denies any other vision changes, history of eye injury  ENT: denies sore throat, hearing problems  Respiratory: denies shortness of breath, wheezing  Cardiovascular: denies chest pain, palpitations  Gastrointestinal: Positive abdominal pain, nausea.  Denies vomiting Genitourinary: denies burning with urination or urinary frequency Musculoskeletal: denies any other joint pains or cramps  Skin: denies any other rashes or skin  discolorations  Neurological: denies any other headache, dizziness, weakness  Psychiatric: denies any other depression, anxiety   All other review of systems were negative   VITAL SIGNS:  Temp:  [98.1 F (36.7 C)-98.6 F (37 C)] 98.5 F (36.9 C) (09/14 0812) Pulse Rate:  [30-98] 82 (09/14 0812) Resp:  [12-34] 18 (09/14 0812) BP: (148-229)/(46-119) 174/80 (09/14 0812) SpO2:  [90 %-99 %] 97 % (09/14 0812) Weight:  [76.2 kg-76.7 kg] 76.7 kg (09/14 1100)     Height: '5\' 3"'$  (160 cm) Weight: 76.7 kg BMI (Calculated): 29.94   INTAKE/OUTPUT:  This shift: Total I/O In: 1396.2 [I.V.:1366.2; NG/GT:30] Out: 550 [Urine:400; Emesis/NG output:150]  Last 2 shifts: '@IOLAST2SHIFTS'$ @   PHYSICAL EXAM:  Constitutional:  -- Normal body habitus  -- Awake, alert, and oriented x3  Eyes:  -- Pupils equally round and reactive to light  -- No scleral icterus  Ear, nose, and throat:  -- No jugular venous distension  Pulmonary:  -- No crackles  -- Equal breath sounds bilaterally -- Breathing non-labored at rest Cardiovascular:  -- S1, S2 present  -- No pericardial rubs Gastrointestinal:  -- Abdomen soft, nontender, non-distended, no guarding or rebound tenderness -- Colostomy pink and patent Musculoskeletal and Integumentary:  -- Wounds: None appreciated -- Extremities: B/L UE and LE FROM, hands and feet warm, no edema  Neurologic:  -- Motor function: intact and symmetric -- Sensation: intact and symmetric   Labs:     Latest Ref Rng & Units 11/22/2021    4:09 AM 11/21/2021    3:46 PM 11/21/2021    9:39 AM  CBC  WBC 4.0 - 10.5 K/uL 4.5  6.7  3.8   Hemoglobin 13.0 - 17.0 g/dL 14.1  15.4  13.7   Hematocrit 39.0 - 52.0 % 42.3  45.9  41.5   Platelets 150 - 400 K/uL 235  235  207       Latest Ref Rng & Units 11/22/2021    4:09 AM 11/21/2021    6:24 PM 11/21/2021    9:39 AM  CMP  Glucose 70 - 99 mg/dL 160  171  166   BUN 8 - 23 mg/dL '22  23  28   '$ Creatinine 0.61 - 1.24 mg/dL 1.29  1.34  1.56    Sodium 135 - 145 mmol/L 138  136  136   Potassium 3.5 - 5.1 mmol/L 4.3  4.2  4.3   Chloride 98 - 111 mmol/L 108  106  105   CO2 22 - 32 mmol/L '22  19  26   '$ Calcium 8.9 - 10.3 mg/dL 8.4  8.6  8.6   Total Protein 6.5 - 8.1 g/dL  7.5  7.6   Total Bilirubin 0.3 - 1.2 mg/dL  0.5  0.5   Alkaline Phos 38 - 126 U/L  72  71   AST 15 - 41 U/L  22  18   ALT 0 - 44 U/L  16  17      Imaging studies:  EXAM: CT ABDOMEN AND PELVIS WITH CONTRAST   TECHNIQUE: Multidetector CT imaging of the abdomen and pelvis was performed using the standard protocol following bolus administration of intravenous contrast.   RADIATION DOSE REDUCTION: This exam was performed according to the departmental dose-optimization program which includes automated exposure control, adjustment of the mA and/or kV according to patient size and/or use of iterative reconstruction technique.   CONTRAST:  46m OMNIPAQUE IOHEXOL 300 MG/ML  SOLN   COMPARISON:  Previous studies including the examination done earlier today   FINDINGS: Lower chest: Heart is enlarged in size. There is no focal consolidation in the visualized lower lung fields.   Hepatobiliary: Liver is not enlarged. There is no dilation of bile ducts. There is high density in the lumen of gallbladder, most likely due to vicarious contrast excretion in the bile from previous CT.   Pancreas: No focal abnormalities are seen.   Spleen: Spleen is not enlarged.   Adrenals/Urinary Tract: Adrenals are unremarkable. There is no hydronephrosis. There is 1.6 cm cyst in the lower pole of right kidney. Ureters are not dilated. There is contrast in the collecting systems and bladder from previous CT.   Stomach/Bowel: Small hiatal hernia is seen. Stomach is unremarkable. Proximal small bowel loops are not dilated. There is dilation of distal small bowel loops in lower abdomen and pelvis measuring up to 3.3 cm in diameter. There is fecalization in ileal loop slightly  to the right of midline. There is change in caliber of small bowel loops in right lower abdomen with decompression of distal and terminal ileum. There is wall thickening in some of the dilated small bowel loops in lower abdomen. Appendix is not dilated. Numerous diverticula are seen in colon. There is transverse colostomy in left periumbilical region. Left colon is not seen and appears to have been removed.   Vascular/Lymphatic: Scattered arterial calcifications are seen. No new significant lymphadenopathy is seen.   Reproductive: Prostate is not seen.   Other: There is interval appearance of small ascites. There is no pneumoperitoneum. There is fluid density in the right inguinal canal. Right inguinal hernia containing fat is seen.   Musculoskeletal: Degenerative changes are noted in lower thoracic spine and lumbar spine.   IMPRESSION: There is interval worsening of small bowel dilation in lower abdomen and pelvis. There is wall thickening in some of the dilated small bowel loops. There is fecalization in distal ileum with change in caliber in right lower abdomen. Findings suggest partial small bowel obstruction with interval worsening. This may be due to adhesions. There is interval appearance of small ascites which may be due to small-bowel obstruction. There is wall thickening in some of the dilated distal small bowel loops suggesting possible enteritis.   There is no hydronephrosis. Appendix is not dilated. Diverticulosis of colon. 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   Other findings as described in the body of the report.     Electronically Signed   By:  Elmer Picker M.D.   On: 11/21/2021 20:13  Assessment/Plan:  81 y.o. male with suspected partial SBO, complicated by pertinent comorbidities including rectal cancer.  Patient with physical exam and CT scan imaging suspecting partial SBO versus constipation.  Patient says that he felt worse  after drinking the oral contrast for the CT scan.  Abdominal exam this morning is benign without abdominal pain and no distention.  Abdominal x-ray after NGT placement without significant small bowel dilation.  We will do Gastrografin challenge.  We will also continue to follow clinically.  No surgical intervention indicated at this moment.  Patient should resolve with medical management.  Arnold Long, MD

## 2021-11-22 NOTE — Progress Notes (Signed)
Progress Note    Willie [name]  SWN:[id number] DOB: 1940/08/03  DOA: 11/21/2021 PCP: [name], MD      Brief Narrative:    Medical records reviewed and are as summarized below:  Willie [name] is a 81 y.o. male  with medical history significant for DM, HTN, CAD, rectal cancer s/p colectomy, chemotherapy and XRT, with colostomy bag, prior admissions for small bowel obstruction most recently from 7/16 to 09/27/2021.  He presented to the hospital with 24-hour history of abdominal pain, abdominal distention, vomiting, lack of output in the colostomy bag.  He had gone for routine surveillance CT chest, abdomen and pelvis with contrast prior to admission and he believes this triggered his symptoms.    He was admitted to the hospital for partial small bowel obstruction that was evident on repeat CT abdomen and pelvis.    Assessment/Plan:   Principal Problem:   Partial small bowel obstruction (HCC) Active Problems:   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HCC)   Hypertensive urgency   AKI (acute kidney injury) ([place])   History of NSVT (nonsustained ventricular tachycardia) (HCC)   CAD (coronary artery disease)   Controlled type 2 diabetes mellitus without complication, with long-term current use of insulin (HCC)   Colostomy status (HCC)    Body mass index is 29.94 kg/m.   Partial small bowel obstruction, history of recurrent SBO with recent hospitalization from 09/23/2021 through 09/27/2021 for SBO: Keep NPO.  Continue NG tube for gastric decompression.  Continue IV fluids for hydration.  Use analgesics and antiemetics as needed for pain and vomiting respectively.  AKI with non-anion gap metabolic acidosis: Improving.  Continue IV fluids for hydration.  Monitor BMP.  Hypertensive urgency: BP is better but still elevated.  Lisinopril and metoprolol have been held because of n.p.o. status.  He is scheduled IV metoprolol for hypertension.  Use IV labetalol as needed for severe  hypertension.  Type II DM: Use NovoLog as needed for hyperglycemia  History of NSVT: Use IV metoprolol in place of oral metoprolol because of n.p.o. status  History of rectal cancer s/p colectomy, chemotherapy and radiation therapy, colostomy status: Outpatient follow-up with oncologist  Diet Order             Diet NPO time specified  Diet effective now                            Consultants: General surgeon  Procedures: None    Medications:    brimonidine  1 drop Both Eyes BID   enoxaparin (LOVENOX) injection  40 mg Subcutaneous Q24H   [START ON 11/23/2021] influenza vaccine adjuvanted  0.5 mL Intramuscular Tomorrow-1000   insulin aspart  0-15 Units Subcutaneous Q4H   metoprolol tartrate  5 mg Intravenous Q6H   Netarsudil-Latanoprost  1 drop Both Eyes QHS   pantoprazole (PROTONIX) IV  40 mg Intravenous Q24H   [START ON 11/23/2021] pneumococcal 20-valent conjugate vaccine  0.5 mL Intramuscular Tomorrow-1000   Continuous Infusions:  lactated ringers 125 mL/hr at 11/22/21 1156     Anti-infectives (From admission, onward)    None              Family Communication/Anticipated D/C date and plan/Code Status   DVT prophylaxis: enoxaparin (LOVENOX) injection 40 mg Start: 11/21/21 2145     Code Status: DNR  Family Communication: Plan discussed with [place], nephew, at the bedside Disposition Plan: Plan to discharge home in 2 to 3  days   Status is: Inpatient Remains inpatient appropriate because: N.p.o. for potential small bowel obstruction       Subjective:   Interval events noted.  No abdominal pain or vomiting.  Objective:    Vitals:   11/22/21 0420 11/22/21 0801 11/22/21 0812 11/22/21 1100  BP: (!) 157/88 (!) 164/78 (!) 174/80   Pulse:  (!) 30 82   Resp: '20 14 18   '$ Temp: 98.6 F (37 C) 98.1 F (36.7 C) 98.5 F (36.9 C)   TempSrc:   Oral   SpO2: 95% 96% 97%   Weight:    76.7 kg  Height:    '5\' 3"'$  (1.6 m)   No data  found.   Intake/Output Summary (Last 24 hours) at 11/22/2021 1300 Last data filed at 11/22/2021 1156 Gross per 24 hour  Intake 1449.25 ml  Output 700 ml  Net 749.25 ml   Filed Weights   11/21/21 2100 11/22/21 1100  Weight: 76.2 kg 76.7 kg    Exam:  GEN: NAD SKIN: Warm and dry EYES: EOMI ENT: MMM, NG tube in place CV: RRR PULM: CTA B ABD: soft, ND, NT, +BS, +colostomy (smear of stool in the colostomy bag) CNS: AAO x 3, non focal EXT: No edema or tenderness        Data Reviewed:   I have personally reviewed following labs and imaging studies:  Labs: Labs show the following:   Basic Metabolic Panel: Recent Labs  Lab 11/21/21 0939 11/21/21 1711 11/21/21 1824 11/22/21 0409  NA 136  --  136 138  K 4.3  --  4.2 4.3  CL 105  --  106 108  CO2 26  --  19* 22  GLUCOSE 166*  --  171* 160*  BUN 28*  --  23 22  CREATININE 1.56*  --  1.34* 1.29*  CALCIUM 8.6*  --  8.6* 8.4*  MG  --  2.1  --  2.1   GFR Estimated Creatinine Clearance: 39.8 mL/min (A) (by C-G formula based on SCr of 1.29 mg/dL (H)). Liver Function Tests: Recent Labs  Lab 11/21/21 0939 11/21/21 1824  AST 18 22  ALT 17 16  ALKPHOS 71 72  BILITOT 0.5 0.5  PROT 7.6 7.5  ALBUMIN 3.7 3.8   No results for input(s): "LIPASE", "AMYLASE" in the last 168 hours. No results for input(s): "AMMONIA" in the last 168 hours. Coagulation profile No results for input(s): "INR", "PROTIME" in the last 168 hours.  CBC: Recent Labs  Lab 11/21/21 0939 11/21/21 1546 11/22/21 0409  WBC 3.8* 6.7 4.5  NEUTROABS 2.3 5.7  --   HGB 13.7 15.4 14.1  HCT 41.5 45.9 42.3  MCV 90.0 87.8 86.5  PLT 207 235 235   Cardiac Enzymes: No results for input(s): "CKTOTAL", "CKMB", "CKMBINDEX", "TROPONINI" in the last 168 hours. BNP (last 3 results) No results for input(s): "PROBNP" in the last 8760 hours. CBG: Recent Labs  Lab 11/22/21 0023 11/22/21 0419 11/22/21 0847 11/22/21 1153  GLUCAP 196* 164* 92 126*    D-Dimer: No results for input(s): "DDIMER" in the last 72 hours. Hgb A1c: Recent Labs    11/21/21 1546  HGBA1C 7.4*   Lipid Profile: No results for input(s): "CHOL", "HDL", "LDLCALC", "TRIG", "CHOLHDL", "LDLDIRECT" in the last 72 hours. Thyroid function studies: No results for input(s): "TSH", "T4TOTAL", "T3FREE", "THYROIDAB" in the last 72 hours.  Invalid input(s): "FREET3" Anemia work up: No results for input(s): "VITAMINB12", "FOLATE", "FERRITIN", "TIBC", "IRON", "RETICCTPCT" in the last 72  hours. Sepsis Labs: Recent Labs  Lab 11/21/21 0939 11/21/21 1546 11/22/21 0409  WBC 3.8* 6.7 4.5    Microbiology No results found for this or any previous visit (from the past 240 hour(s)).  Procedures and diagnostic studies:  DG Abdomen 1 View  Result Date: 11/21/2021 CLINICAL DATA:  NG tube placement. EXAM: ABDOMEN - 1 VIEW COMPARISON:  CT abdomen and pelvis 11/21/2021 FINDINGS: An enteric tube has been placed and terminates in the expected region of the gastric body with side port near the GE junction. Residual or contrast material is noted in multiple small bowel loops in the included portion of the abdomen. The included lung bases are grossly clear. IMPRESSION: Enteric tube in the proximal stomach with side port near the GE junction. Electronically Signed   By: Logan Bores M.D.   On: 11/21/2021 21:52   CT Abdomen Pelvis W Contrast  Result Date: 11/21/2021 CLINICAL DATA:  Abdominal pain, possible bowel obstruction EXAM: CT ABDOMEN AND PELVIS WITH CONTRAST TECHNIQUE: Multidetector CT imaging of the abdomen and pelvis was performed using the standard protocol following bolus administration of intravenous contrast. RADIATION DOSE REDUCTION: This exam was performed according to the departmental dose-optimization program which includes automated exposure control, adjustment of the mA and/or kV according to patient size and/or use of iterative reconstruction technique. CONTRAST:  51m  OMNIPAQUE IOHEXOL 300 MG/ML  SOLN COMPARISON:  Previous studies including the examination done earlier today FINDINGS: Lower chest: Heart is enlarged in size. There is no focal consolidation in the visualized lower lung fields. Hepatobiliary: Liver is not enlarged. There is no dilation of bile ducts. There is high density in the lumen of gallbladder, most likely due to vicarious contrast excretion in the bile from previous CT. Pancreas: No focal abnormalities are seen. Spleen: Spleen is not enlarged. Adrenals/Urinary Tract: Adrenals are unremarkable. There is no hydronephrosis. There is 1.6 cm cyst in the lower pole of right kidney. Ureters are not dilated. There is contrast in the collecting systems and bladder from previous CT. Stomach/Bowel: Small hiatal hernia is seen. Stomach is unremarkable. Proximal small bowel loops are not dilated. There is dilation of distal small bowel loops in lower abdomen and pelvis measuring up to 3.3 cm in diameter. There is fecalization in ileal loop slightly to the right of midline. There is change in caliber of small bowel loops in right lower abdomen with decompression of distal and terminal ileum. There is wall thickening in some of the dilated small bowel loops in lower abdomen. Appendix is not dilated. Numerous diverticula are seen in colon. There is transverse colostomy in left periumbilical region. Left colon is not seen and appears to have been removed. Vascular/Lymphatic: Scattered arterial calcifications are seen. No new significant lymphadenopathy is seen. Reproductive: Prostate is not seen. Other: There is interval appearance of small ascites. There is no pneumoperitoneum. There is fluid density in the right inguinal canal. Right inguinal hernia containing fat is seen. Musculoskeletal: Degenerative changes are noted in lower thoracic spine and lumbar spine. IMPRESSION: There is interval worsening of small bowel dilation in lower abdomen and pelvis. There is wall  thickening in some of the dilated small bowel loops. There is fecalization in distal ileum with change in caliber in right lower abdomen. Findings suggest partial small bowel obstruction with interval worsening. This may be due to adhesions. There is interval appearance of small ascites which may be due to small-bowel obstruction. There is wall thickening in some of the dilated distal small bowel loops suggesting  possible enteritis. There is no hydronephrosis. Appendix is not dilated. Diverticulosis of colon. 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 Other findings as described in the body of the report. Electronically Signed   By: Elmer Picker M.D.   On: 11/21/2021 20:13   CT CHEST ABDOMEN PELVIS W CONTRAST  Result Date: 11/21/2021 CLINICAL DATA:  Rectal cancer, restaging EXAM: CT CHEST, ABDOMEN, AND PELVIS WITH CONTRAST TECHNIQUE: Multidetector CT imaging of the chest, abdomen and pelvis was performed following the standard protocol during bolus administration of intravenous contrast. RADIATION DOSE REDUCTION: This exam was performed according to the departmental dose-optimization program which includes automated exposure control, adjustment of the mA and/or kV according to patient size and/or use of iterative reconstruction technique. CONTRAST:  14m OMNIPAQUE IOHEXOL 300 MG/ML  SOLN COMPARISON:  CT abdomen and pelvis 09/23/2021.  CT chest 05/17/2021. FINDINGS: CT CHEST FINDINGS Cardiovascular: Heart is normal size. Aorta is normal caliber. Coronary artery and aortic calcifications. Mediastinum/Nodes: No mediastinal, hilar, or axillary adenopathy. Trachea and esophagus are unremarkable. Thyroid unremarkable. Lungs/Pleura: Lungs are clear. No focal airspace opacities or suspicious nodules. No effusions. Musculoskeletal: Chest wall soft tissues are unremarkable. No acute bony abnormality. CT ABDOMEN PELVIS FINDINGS Hepatobiliary: No focal hepatic abnormality. Gallbladder unremarkable.  Pancreas: No focal abnormality or ductal dilatation. Spleen: No focal abnormality.  Normal size. Adrenals/Urinary Tract: No adrenal abnormality. No suspicious focal renal abnormality. No stones or hydronephrosis. Urinary bladder is unremarkable. Stomach/Bowel: Prior partial colectomy with Hartmann's pouch and left lower quadrant ostomy. Again noted are mildly prominent pelvic small bowel loops, unchanged since prior study. Colonic diverticulosis. Normal appendix. Stomach unremarkable. Vascular/Lymphatic: Aortic atherosclerosis. No evidence of aneurysm or adenopathy. Reproductive: No visible focal abnormality.  Prior prostatectomy. Other: No free fluid or free air. Musculoskeletal: No acute bony abnormality. IMPRESSION: No acute findings or evidence of metastatic disease in the chest, abdomen or pelvis. Stable postoperative changes with left lower quadrant ostomy. Coronary artery disease, aortic atherosclerosis. Electronically Signed   By: KRolm BaptiseM.D.   On: 11/21/2021 17:08               LOS: 1 day   Loyal Holzheimer  [facility]   Pager on [email or web address] If 7PM-7AM, please contact night-coverage at [email or web address]     11/22/2021, 1:00 PM

## 2021-11-22 NOTE — IPAL (Signed)
  Interdisciplinary Goals of Care Family Meeting   Date carried out: 11/22/2021  Location of the meeting: Bedside  Member's involved: Physician and Family Member or next of kin  Durable Power of Attorney or acting medical decision maker: nephew Purvis Sheffield   Discussion: We discussed goals of care for [employer] .    Code status: Full DNR  Disposition: Home  Time spent for the meeting: [place], MD  11/22/2021, 4:44 AM

## 2021-11-22 NOTE — TOC Initial Note (Signed)
Transition of Care [facility]) - Initial/Assessment Note    Patient Details  Name: Willie [name] MRN: [id number] Date of Birth: 07-04-1940  Transition of Care [facility]) CM/SW Contact:    Beverly Sessions, RN Phone Number: 11/22/2021, 2:49 PM  Clinical Narrative:                  Transition of Care [facility]) Screening Note   Patient Details  Name: Willie [name] Date of Birth: [birth year]   Transition of Care [facility]) CM/SW Contact:    Beverly Sessions, RN Phone Number: 11/22/2021, 2:49 PM    Transition of Care Department [facility]) has reviewed patient and no TOC needs have been identified at this time. We will continue to monitor patient advancement through interdisciplinary progression rounds. If new patient transition needs arise, please place a TOC consult.          Patient Goals and CMS Choice        Expected Discharge Plan and Services                                                Prior Living Arrangements/Services                       Activities of Daily Living Home Assistive Devices/Equipment: None ADL Screening (condition at time of admission) Patient's cognitive ability adequate to safely complete daily activities?: Yes Is the patient deaf or have difficulty hearing?: No Does the patient have difficulty seeing, even when wearing glasses/contacts?: No Does the patient have difficulty concentrating, remembering, or making decisions?: No Patient able to express need for assistance with ADLs?: Yes Does the patient have difficulty dressing or bathing?: No Independently performs ADLs?: Yes (appropriate for developmental age) Does the patient have difficulty walking or climbing stairs?: Yes Weakness of Legs: Both Weakness of Arms/Hands: Both  Permission Sought/Granted                  Emotional Assessment              Admission diagnosis:  Small bowel obstruction ([place]) [K56.609] Partial small bowel obstruction (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[place]) 05/13/2019   Chemotherapy induced neutropenia ([place]) 03/11/2019   Hypokalemia 10/27/2018   Protein-calorie malnutrition, severe 10/12/2018   Sepsis ([place]) 10/10/2018   Hypercholesteremia 08/17/2018   Goals of care, counseling/discussion 08/05/2018   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[place]) 08/05/2018   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   History of gastric ulcer 07/07/2018   History of NSVT (nonsustained ventricular tachycardia) ([place]) 10/31/2017   Benign prostatic hyperplasia 09/16/2017   Controlled type 2 diabetes mellitus without complication, with long-term current use of insulin ([place]) 09/16/2017   Coronary artery disease involving native heart without angina pectoris 09/16/2017   Essential hypertension 09/16/2017   Onychomycosis due to dermatophyte 05/28/2013   PCP:  [name], MD Pharmacy:  [facility] [address] [place] [address] Phone: [phone] Fax: [phone]     Social Determinants of Health (SDOH) Interventions    Readmission Risk Interventions     No data to display

## 2021-11-23 ENCOUNTER — Other Ambulatory Visit: Payer: Self-pay

## 2021-11-23 ENCOUNTER — Encounter: Payer: Self-pay | Admitting: Internal Medicine

## 2021-11-23 ENCOUNTER — Encounter
Admission: EM | Disposition: A | Discharge status: Home / Self Care | Payer: Self-pay | Source: Home / Self Care | Attending: Internal Medicine

## 2021-11-23 ENCOUNTER — Inpatient Hospital Stay: Payer: Medicare PPO | Admitting: Certified Registered Nurse Anesthetist

## 2021-11-23 ENCOUNTER — Inpatient Hospital Stay: Payer: Medicare PPO

## 2021-11-23 DIAGNOSIS — Z933 Colostomy status: Secondary | ICD-10-CM | POA: Diagnosis not present

## 2021-11-23 DIAGNOSIS — K56600 Partial intestinal obstruction, unspecified as to cause: Secondary | ICD-10-CM | POA: Diagnosis not present

## 2021-11-23 DIAGNOSIS — E119 Type 2 diabetes mellitus without complications: Secondary | ICD-10-CM | POA: Diagnosis not present

## 2021-11-23 DIAGNOSIS — N179 Acute kidney failure, unspecified: Secondary | ICD-10-CM | POA: Diagnosis not present

## 2021-11-23 HISTORY — PX: XI ROBOT ASSISTED DIAGNOSTIC LAPAROSCOPY: SHX6815

## 2021-11-23 LAB — TYPE AND SCREEN
ABO/RH(D): A POS
Antibody Screen: NEGATIVE

## 2021-11-23 LAB — BASIC METABOLIC PANEL
Anion gap: 15 (ref 5–15)
BUN: 16 mg/dL (ref 8–23)
CO2: 21 mmol/L — ABNORMAL LOW (ref 22–32)
Calcium: 8.6 mg/dL — ABNORMAL LOW (ref 8.9–10.3)
Chloride: 102 mmol/L (ref 98–111)
Creatinine, Ser: 1.22 mg/dL (ref 0.61–1.24)
GFR, Estimated: 59 mL/min — ABNORMAL LOW (ref >60)
Glucose, Bld: 159 mg/dL — ABNORMAL HIGH (ref 70–99)
Potassium: 4.2 mmol/L (ref 3.5–5.1)
Sodium: 138 mmol/L (ref 135–145)

## 2021-11-23 LAB — GLUCOSE, CAPILLARY
Glucose-Capillary: 121 mg/dL — ABNORMAL HIGH (ref 70–99)
Glucose-Capillary: 136 mg/dL — ABNORMAL HIGH (ref 70–99)
Glucose-Capillary: 140 mg/dL — ABNORMAL HIGH (ref 70–99)
Glucose-Capillary: 140 mg/dL — ABNORMAL HIGH (ref 70–99)
Glucose-Capillary: 159 mg/dL — ABNORMAL HIGH (ref 70–99)
Glucose-Capillary: 159 mg/dL — ABNORMAL HIGH (ref 70–99)
Glucose-Capillary: 171 mg/dL — ABNORMAL HIGH (ref 70–99)

## 2021-11-23 SURGERY — LAPAROSCOPY, DIAGNOSTIC, ROBOT-ASSISTED
Anesthesia: General | Site: Abdomen

## 2021-11-23 MED ORDER — ACETAMINOPHEN 10 MG/ML IV SOLN
INTRAVENOUS | Status: DC | PRN
  Administered 2021-11-23: 1000 mg via INTRAVENOUS

## 2021-11-23 MED ORDER — SUCCINYLCHOLINE CHLORIDE 200 MG/10ML IV SOSY
PREFILLED_SYRINGE | INTRAVENOUS | Status: DC | PRN
  Administered 2021-11-23: 100 mg via INTRAVENOUS

## 2021-11-23 MED ORDER — PROPOFOL 10 MG/ML IV BOLUS
INTRAVENOUS | Status: DC | PRN
  Administered 2021-11-23: 120 mg via INTRAVENOUS

## 2021-11-23 MED ORDER — MORPHINE SULFATE (PF) 4 MG/ML IV SOLN
4.0000 mg | INTRAVENOUS | Status: DC | PRN
  Administered 2021-11-23 – 2021-11-24 (×3): 4 mg via INTRAVENOUS
  Filled 2021-11-23 (×3): qty 1

## 2021-11-23 MED ORDER — BUPIVACAINE HCL (PF) 0.5 % IJ SOLN
INTRAMUSCULAR | Status: AC
  Filled 2021-11-23: qty 30

## 2021-11-23 MED ORDER — 0.9 % SODIUM CHLORIDE (POUR BTL) OPTIME
TOPICAL | Status: DC | PRN
  Administered 2021-11-23: 500 mL

## 2021-11-23 MED ORDER — VASOPRESSIN 20 UNIT/ML IV SOLN
INTRAVENOUS | Status: DC | PRN
  Administered 2021-11-23: 2 [IU] via INTRAVENOUS

## 2021-11-23 MED ORDER — DEXAMETHASONE SODIUM PHOSPHATE 10 MG/ML IJ SOLN
INTRAMUSCULAR | Status: DC | PRN
  Administered 2021-11-23: 5 mg via INTRAVENOUS

## 2021-11-23 MED ORDER — ONDANSETRON HCL 4 MG/2ML IJ SOLN
INTRAMUSCULAR | Status: DC | PRN
  Administered 2021-11-23: 4 mg via INTRAVENOUS

## 2021-11-23 MED ORDER — PHENYLEPHRINE HCL (PRESSORS) 10 MG/ML IV SOLN
INTRAVENOUS | Status: DC | PRN
  Administered 2021-11-23: 160 ug via INTRAVENOUS

## 2021-11-23 MED ORDER — BUPIVACAINE LIPOSOME 1.3 % IJ SUSP
INTRAMUSCULAR | Status: AC
  Filled 2021-11-23: qty 20

## 2021-11-23 MED ORDER — PROPOFOL 10 MG/ML IV BOLUS
INTRAVENOUS | Status: AC
  Filled 2021-11-23: qty 20

## 2021-11-23 MED ORDER — BUPIVACAINE LIPOSOME 1.3 % IJ SUSP
INTRAMUSCULAR | Status: DC | PRN
  Administered 2021-11-23: 20 mL

## 2021-11-23 MED ORDER — FENTANYL CITRATE (PF) 100 MCG/2ML IJ SOLN
INTRAMUSCULAR | Status: AC
  Filled 2021-11-23: qty 2

## 2021-11-23 MED ORDER — FENTANYL CITRATE (PF) 100 MCG/2ML IJ SOLN
INTRAMUSCULAR | Status: DC | PRN
  Administered 2021-11-23: 25 ug via INTRAVENOUS
  Administered 2021-11-23: 50 ug via INTRAVENOUS
  Administered 2021-11-23 (×3): 25 ug via INTRAVENOUS
  Administered 2021-11-23: 50 ug via INTRAVENOUS

## 2021-11-23 MED ORDER — MORPHINE SULFATE (PF) 4 MG/ML IV SOLN
4.0000 mg | INTRAVENOUS | Status: DC | PRN
  Administered 2021-11-23: 4 mg via INTRAVENOUS
  Filled 2021-11-23: qty 1

## 2021-11-23 MED ORDER — LIDOCAINE HCL (CARDIAC) PF 100 MG/5ML IV SOSY
PREFILLED_SYRINGE | INTRAVENOUS | Status: DC | PRN
  Administered 2021-11-23: 80 mg via INTRAVENOUS

## 2021-11-23 MED ORDER — OXYCODONE HCL 5 MG/5ML PO SOLN: 5.0000 mg | Freq: Once | ORAL | Status: DC | PRN

## 2021-11-23 MED ORDER — OXYCODONE HCL 5 MG PO TABS: 5.0000 mg | ORAL_TABLET | Freq: Once | ORAL | Status: DC | PRN

## 2021-11-23 MED ORDER — FENTANYL CITRATE (PF) 100 MCG/2ML IJ SOLN: 25.0000 ug | INTRAMUSCULAR | Status: DC | PRN

## 2021-11-23 MED ORDER — SUGAMMADEX SODIUM 200 MG/2ML IV SOLN
INTRAVENOUS | Status: DC | PRN
  Administered 2021-11-23: 200 mg via INTRAVENOUS

## 2021-11-23 MED ORDER — EPHEDRINE SULFATE (PRESSORS) 50 MG/ML IJ SOLN
INTRAMUSCULAR | Status: DC | PRN
  Administered 2021-11-23: 15 mg via INTRAVENOUS

## 2021-11-23 MED ORDER — LABETALOL HCL 5 MG/ML IV SOLN
10.0000 mg | INTRAVENOUS | Status: DC | PRN
  Administered 2021-11-23 – 2021-11-24 (×2): 10 mg via INTRAVENOUS
  Filled 2021-11-23 (×2): qty 4

## 2021-11-23 MED ORDER — CEFAZOLIN SODIUM-DEXTROSE 2-3 GM-%(50ML) IV SOLR
INTRAVENOUS | Status: DC | PRN
  Administered 2021-11-23: 2 g via INTRAVENOUS

## 2021-11-23 MED ORDER — ROCURONIUM BROMIDE 100 MG/10ML IV SOLN
INTRAVENOUS | Status: DC | PRN
  Administered 2021-11-23: 40 mg via INTRAVENOUS
  Administered 2021-11-23 (×2): 20 mg via INTRAVENOUS

## 2021-11-23 MED ORDER — BUPIVACAINE HCL (PF) 0.5 % IJ SOLN
INTRAMUSCULAR | Status: DC | PRN
  Administered 2021-11-23: 30 mL

## 2021-11-23 MED ORDER — SODIUM CHLORIDE 0.9 % IV SOLN: INTRAVENOUS | Status: DC | PRN

## 2021-11-23 MED ORDER — METOPROLOL TARTRATE 5 MG/5ML IV SOLN
5.0000 mg | Freq: Three times a day (TID) | INTRAVENOUS | Status: AC
  Administered 2021-11-23: 5 mg via INTRAVENOUS
  Administered 2021-11-23: 1 mg via INTRAVENOUS
  Administered 2021-11-23: 2.5 mg via INTRAVENOUS
  Administered 2021-11-23: 5 mg via INTRAVENOUS
  Filled 2021-11-23 (×2): qty 5

## 2021-11-23 MED ORDER — ACETAMINOPHEN 10 MG/ML IV SOLN
INTRAVENOUS | Status: AC
  Filled 2021-11-23: qty 100

## 2021-11-23 MED ORDER — METOPROLOL TARTRATE 5 MG/5ML IV SOLN
INTRAVENOUS | Status: AC
  Filled 2021-11-23: qty 5

## 2021-11-23 MED ORDER — CEFAZOLIN SODIUM-DEXTROSE 2-4 GM/100ML-% IV SOLN
INTRAVENOUS | Status: AC
  Filled 2021-11-23: qty 100

## 2021-11-23 SURGICAL SUPPLY — 85 items
BLADE CLIPPER SURG (BLADE) ×1 IMPLANT
BLADE SURG SZ10 CARB STEEL (BLADE) ×1 IMPLANT
BLADE SURG SZ11 CARB STEEL (BLADE) ×1 IMPLANT
CANNULA REDUC XI 12-8 STAPL (CANNULA) ×1
CANNULA REDUCER 12-8 DVNC XI (CANNULA) ×1 IMPLANT
COVER TIP SHEARS 8 DVNC (MISCELLANEOUS) ×1 IMPLANT
COVER TIP SHEARS 8MM DA VINCI (MISCELLANEOUS) ×1
DERMABOND ADVANCED .7 DNX12 (GAUZE/BANDAGES/DRESSINGS) IMPLANT
DRAPE ARM DVNC X/XI (DISPOSABLE) ×4 IMPLANT
DRAPE COLUMN DVNC XI (DISPOSABLE) ×1 IMPLANT
DRAPE DA VINCI XI ARM (DISPOSABLE) ×4
DRAPE DA VINCI XI COLUMN (DISPOSABLE) ×1
DRSG OPSITE POSTOP 4X10 (GAUZE/BANDAGES/DRESSINGS) IMPLANT
DRSG OPSITE POSTOP 4X8 (GAUZE/BANDAGES/DRESSINGS) IMPLANT
ELECT BLADE 6.5 EXT (BLADE) IMPLANT
ELECT CAUTERY BLADE 6.4 (BLADE) IMPLANT
ELECT REM PT RETURN 9FT ADLT (ELECTROSURGICAL) ×1
ELECTRODE REM PT RTRN 9FT ADLT (ELECTROSURGICAL) ×1 IMPLANT
GLOVE BIO SURGEON STRL SZ 6.5 (GLOVE) ×3 IMPLANT
GLOVE BIOGEL PI IND STRL 6.5 (GLOVE) ×3 IMPLANT
GOWN STRL REUS W/ TWL LRG LVL3 (GOWN DISPOSABLE) ×6 IMPLANT
GOWN STRL REUS W/TWL LRG LVL3 (GOWN DISPOSABLE) ×6
HANDLE YANKAUER SUCT BULB TIP (MISCELLANEOUS) ×1 IMPLANT
IRRIGATION STRYKERFLOW (MISCELLANEOUS) IMPLANT
IRRIGATOR STRYKERFLOW (MISCELLANEOUS)
IRRIGATOR SUCT 8 DISP DVNC XI (IRRIGATION / IRRIGATOR) IMPLANT
IRRIGATOR SUCTION 8MM XI DISP (IRRIGATION / IRRIGATOR)
IV NS 1000ML (IV SOLUTION)
IV NS 1000ML BAXH (IV SOLUTION) IMPLANT
KIT IMAGING PINPOINTPAQ (MISCELLANEOUS) ×1 IMPLANT
KIT OSTOMY 2 PC DRNBL 2.25 STR (WOUND CARE) IMPLANT
KIT OSTOMY DRAINABLE 2.25 STR (WOUND CARE) ×1
KIT PINK PAD W/HEAD ARE REST (MISCELLANEOUS) ×1 IMPLANT
KIT PINK PAD W/HEAD ARM REST (MISCELLANEOUS) ×1 IMPLANT
LABEL OR SOLS (LABEL) IMPLANT
MANIFOLD NEPTUNE II (INSTRUMENTS) ×1 IMPLANT
NDL INSUFFLATION 14GA 120MM (NEEDLE) ×1 IMPLANT
NEEDLE HYPO 22GX1.5 SAFETY (NEEDLE) ×1 IMPLANT
NEEDLE INSUFFLATION 14GA 120MM (NEEDLE) ×1 IMPLANT
NEEDLE VERESS 14GA 120MM (NEEDLE) IMPLANT
OBTURATOR OPTICAL STANDARD 8MM (TROCAR) ×1
OBTURATOR OPTICAL STND 8 DVNC (TROCAR) ×1
OBTURATOR OPTICALSTD 8 DVNC (TROCAR) ×1 IMPLANT
PACK COLON CLEAN CLOSURE (MISCELLANEOUS) ×1 IMPLANT
PACK LAP CHOLECYSTECTOMY (MISCELLANEOUS) ×1 IMPLANT
PORT ACCESS TROCAR AIRSEAL 5 (TROCAR) ×1 IMPLANT
RELOAD STAPLE 45 3.5 BLU DVNC (STAPLE) IMPLANT
RELOAD STAPLE 60 3.5 BLU DVNC (STAPLE) IMPLANT
RELOAD STAPLER 3.5X45 BLU DVNC (STAPLE) IMPLANT
RELOAD STAPLER 3.5X60 BLU DVNC (STAPLE) IMPLANT
RETRACTOR WOUND ALXS 18CM SML (MISCELLANEOUS) IMPLANT
RTRCTR WOUND ALEXIS O 18CM SML (MISCELLANEOUS)
SEAL CANN UNIV 5-8 DVNC XI (MISCELLANEOUS) ×3 IMPLANT
SEAL XI 5MM-8MM UNIVERSAL (MISCELLANEOUS) ×4
SEALER VESSEL DA VINCI XI (MISCELLANEOUS)
SEALER VESSEL EXT DVNC XI (MISCELLANEOUS) IMPLANT
SET TRI-LUMEN FLTR TB AIRSEAL (TUBING) ×1 IMPLANT
SET TUBE SMOKE EVAC HIGH FLOW (TUBING) IMPLANT
SOLUTION ELECTROLUBE (MISCELLANEOUS) ×1 IMPLANT
SPONGE T-LAP 18X18 ~~LOC~~+RFID (SPONGE) ×1 IMPLANT
SPONGE T-LAP 4X18 ~~LOC~~+RFID (SPONGE) ×1 IMPLANT
STAPLER 45 DA VINCI SURE FORM (STAPLE)
STAPLER 45 SUREFORM DVNC (STAPLE) IMPLANT
STAPLER 60 DA VINCI SURE FORM (STAPLE)
STAPLER 60 SUREFORM DVNC (STAPLE) IMPLANT
STAPLER CANNULA SEAL DVNC XI (STAPLE) ×1 IMPLANT
STAPLER CANNULA SEAL XI (STAPLE) ×1
STAPLER RELOAD 3.5X45 BLU DVNC (STAPLE)
STAPLER RELOAD 3.5X45 BLUE (STAPLE)
STAPLER RELOAD 3.5X60 BLU DVNC (STAPLE)
STAPLER RELOAD 3.5X60 BLUE (STAPLE)
SUT MNCRL 4-0 (SUTURE)
SUT MNCRL 4-0 27XMFL (SUTURE)
SUT MNCRL AB 4-0 PS2 18 (SUTURE) ×1 IMPLANT
SUT PDS AB 0 CT1 27 (SUTURE) ×2 IMPLANT
SUT SILK 3 0 SH 30 (SUTURE) IMPLANT
SUT VIC AB 3-0 SH 27 (SUTURE) ×1
SUT VIC AB 3-0 SH 27X BRD (SUTURE) ×4 IMPLANT
SUT VICRYL 0 AB UR-6 (SUTURE) ×2 IMPLANT
SUT VLOC 90 6 CV-15 VIOLET (SUTURE) ×1 IMPLANT
SUTURE MNCRL 4-0 27XMF (SUTURE) ×2 IMPLANT
SYR 30ML LL (SYRINGE) ×2 IMPLANT
TRAP FLUID SMOKE EVACUATOR (MISCELLANEOUS) ×1 IMPLANT
TRAY FOLEY MTR SLVR 16FR STAT (SET/KITS/TRAYS/PACK) ×1 IMPLANT
WATER STERILE IRR 500ML POUR (IV SOLUTION) ×1 IMPLANT

## 2021-11-23 NOTE — Plan of Care (Signed)
  Problem: Clinical Measurements: Goal: Cardiovascular complication will be avoided Outcome: Progressing   Problem: Activity: Goal: Risk for activity intolerance will decrease Outcome: Progressing   Problem: Nutrition: Goal: Adequate nutrition will be maintained Outcome: Progressing   Problem: Pain Managment: Goal: General experience of comfort will improve Outcome: Progressing

## 2021-11-23 NOTE — Anesthesia Postprocedure Evaluation (Signed)
Anesthesia Post Note  Patient: Willie [name]  Procedure(s) Performed: XI ROBOT ASSISTED DIAGNOSTIC LAPAROSCOPY (Abdomen)  Patient location during evaluation: PACU Anesthesia Type: General Level of consciousness: awake and alert, oriented and patient cooperative Pain management: pain level controlled Vital Signs Assessment: post-procedure vital signs reviewed and stable Respiratory status: spontaneous breathing, nonlabored ventilation and respiratory function stable Cardiovascular status: blood pressure returned to baseline and stable Postop Assessment: adequate PO intake Anesthetic complications: no   No notable events documented.   Last Vitals:  Vitals:   11/23/21 1613 11/23/21 1629  BP: (!) 176/70 (!) 182/65  Pulse: (!) 31 69  Resp: 13 15  Temp: (!) 36.3 C 37.2 C  SpO2: 97% 96%    Last Pain:  Vitals:   11/23/21 1613  TempSrc:   PainSc: 0-No pain                 Darrin Nipper

## 2021-11-23 NOTE — Progress Notes (Addendum)
Progress Note    Willie [name]  ZOX:[id number] DOB: August 15, 1940  DOA: 11/21/2021 PCP: [name], MD      Brief Narrative:    Medical records reviewed and are as summarized below:  AARIB PULIDO is a 81 y.o. male  with medical history significant for DM, HTN, CAD, rectal cancer s/p colectomy, chemotherapy and XRT, with colostomy bag, prior admissions for small bowel obstruction most recently from 7/16 to 09/27/2021.  He presented to the hospital with 24-hour history of abdominal pain, abdominal distention, vomiting, lack of output in the colostomy bag.  He had gone for routine surveillance CT chest, abdomen and pelvis with contrast prior to admission and he believes this triggered his symptoms.    He was admitted to the hospital for partial small bowel obstruction that was evident on repeat CT abdomen and pelvis.    Assessment/Plan:   Principal Problem:   Partial small bowel obstruction (HCC) Active Problems:   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HCC)   Hypertensive urgency   AKI (acute kidney injury) ([place])   History of NSVT (nonsustained ventricular tachycardia) (HCC)   CAD (coronary artery disease)   Controlled type 2 diabetes mellitus without complication, with long-term current use of insulin (HCC)   Colostomy status (HCC)    Body mass index is 29.95 kg/m.   Persistent small bowel obstruction, history of recurrent SBO with recent hospitalization from 09/23/2021 through 09/27/2021 for SBO: Abdominal x-ray obtained today showed persistent SBO.  Dr. Windell Moment, general surgeon, has decided to take the patient to the OR today for surgical intervention.  Continue IV fluids for hydration since patient is nil by mouth.  AKI with non-anion gap metabolic acidosis: Improved.   Hypertensive urgency: Restart scheduled IV metoprolol.  Use IV labetalol as needed for severe hypertension.  Hopefully, BP will trend down with the use of anesthetic agents and intubation for surgery.  Home  lisinopril and metoprolol have been held because of n.p.o. status.   Type II DM: Use NovoLog as needed for hyperglycemia  History of NSVT: Use IV metoprolol in place of oral metoprolol because of n.p.o. status  History of rectal cancer s/p colectomy, chemotherapy and radiation therapy, colostomy status: Outpatient follow-up with oncologist  Plan of care was discussed with patient and Misty, RN.   Diet Order             Diet NPO time specified  Diet effective now                            Consultants: General surgeon  Procedures: None    Medications:    [MAR Hold] brimonidine  1 drop Both Eyes BID   [MAR Hold] enoxaparin (LOVENOX) injection  40 mg Subcutaneous Q24H   influenza vaccine adjuvanted  0.5 mL Intramuscular Tomorrow-1000   [MAR Hold] insulin aspart  0-15 Units Subcutaneous Q4H   [MAR Hold] metoprolol tartrate  5 mg Intravenous Q8H   [MAR Hold] Netarsudil-Latanoprost  1 drop Both Eyes QHS   [MAR Hold] pantoprazole (PROTONIX) IV  40 mg Intravenous Q24H   [MAR Hold] pneumococcal 20-valent conjugate vaccine  0.5 mL Intramuscular Tomorrow-1000   Continuous Infusions:  lactated ringers Stopped (11/23/21 0639)     Anti-infectives (From admission, onward)    None              Family Communication/Anticipated D/C date and plan/Code Status   DVT prophylaxis: enoxaparin (LOVENOX) injection 40 mg Start: 11/21/21 2145  Code Status: DNR  Family Communication: Plan discussed with [place] Bells, nephew, at the bedside Disposition Plan: Plan to discharge home in 2 to 3 days   Status is: Inpatient Remains inpatient appropriate because: Requires surgery for small bowel obstruction      Subjective:   Interval events noted.  No abdominal pain or vomiting.  No headache, dizziness, shortness of breath, chest pain, palpitations.  He had nausea earlier.  Blood pressure has been ele shortness vated.  [place] Bells, nephew, was at the  bedside.  Objective:    Vitals:   11/23/21 0347 11/23/21 0804 11/23/21 1058 11/23/21 1119  BP: (!) 176/88 (!) 179/80 (!) 190/70 (!) 173/66  Pulse: (!) 108 (!) 108 96 86  Resp:  18  20  Temp: 98.7 F (37.1 C) 99.1 F (37.3 C)    TempSrc: Oral Oral  Temporal  SpO2: 99% 97%  96%  Weight:    76.7 kg  Height:    '5\' 3"'$  (1.6 m)   No data found.   Intake/Output Summary (Last 24 hours) at 11/23/2021 1131 Last data filed at 11/23/2021 0753 Gross per 24 hour  Intake 2887.32 ml  Output 1650 ml  Net 1237.32 ml   Filed Weights   11/21/21 2100 11/22/21 1100 11/23/21 1119  Weight: 76.2 kg 76.7 kg 76.7 kg    Exam:  GEN: NAD SKIN: Warm and dry EYES: No pallor or icterus ENT: MMM, NG tube in place CV: RRR PULM: CTA B ABD: soft, distended, NT, +BS, smear of stool in colostomy bag CNS: AAO x 3, non focal EXT: No edema or tenderness            Data Reviewed:   I have personally reviewed following labs and imaging studies:  Labs: Labs show the following:   Basic Metabolic Panel: Recent Labs  Lab 11/21/21 0939 11/21/21 1711 11/21/21 1824 11/22/21 0409 11/23/21 0409  NA 136  --  136 138 138  K 4.3  --  4.2 4.3 4.2  CL 105  --  106 108 102  CO2 26  --  19* 22 21*  GLUCOSE 166*  --  171* 160* 159*  BUN 28*  --  '23 22 16  '$ CREATININE 1.56*  --  1.34* 1.29* 1.22  CALCIUM 8.6*  --  8.6* 8.4* 8.6*  MG  --  2.1  --  2.1  --    GFR Estimated Creatinine Clearance: 42 mL/min (by C-G formula based on SCr of 1.22 mg/dL). Liver Function Tests: Recent Labs  Lab 11/21/21 0939 11/21/21 1824  AST 18 22  ALT 17 16  ALKPHOS 71 72  BILITOT 0.5 0.5  PROT 7.6 7.5  ALBUMIN 3.7 3.8   No results for input(s): "LIPASE", "AMYLASE" in the last 168 hours. No results for input(s): "AMMONIA" in the last 168 hours. Coagulation profile No results for input(s): "INR", "PROTIME" in the last 168 hours.  CBC: Recent Labs  Lab 11/21/21 0939 11/21/21 1546 11/22/21 0409  WBC 3.8*  6.7 4.5  NEUTROABS 2.3 5.7  --   HGB 13.7 15.4 14.1  HCT 41.5 45.9 42.3  MCV 90.0 87.8 86.5  PLT 207 235 235   Cardiac Enzymes: No results for input(s): "CKTOTAL", "CKMB", "CKMBINDEX", "TROPONINI" in the last 168 hours. BNP (last 3 results) No results for input(s): "PROBNP" in the last 8760 hours. CBG: Recent Labs  Lab 11/22/21 2005 11/22/21 2325 11/23/21 0341 11/23/21 0804 11/23/21 1119  GLUCAP 136* 117* 140* 171* 121*   D-Dimer: No results for  input(s): "DDIMER" in the last 72 hours. Hgb A1c: Recent Labs    11/21/21 1546  HGBA1C 7.4*   Lipid Profile: No results for input(s): "CHOL", "HDL", "LDLCALC", "TRIG", "CHOLHDL", "LDLDIRECT" in the last 72 hours. Thyroid function studies: No results for input(s): "TSH", "T4TOTAL", "T3FREE", "THYROIDAB" in the last 72 hours.  Invalid input(s): "FREET3" Anemia work up: No results for input(s): "VITAMINB12", "FOLATE", "FERRITIN", "TIBC", "IRON", "RETICCTPCT" in the last 72 hours. Sepsis Labs: Recent Labs  Lab 11/21/21 0939 11/21/21 1546 11/22/21 0409  WBC 3.8* 6.7 4.5    Microbiology No results found for this or any previous visit (from the past 240 hour(s)).  Procedures and diagnostic studies:  DG Abd 2 Views  Result Date: 11/23/2021 CLINICAL DATA:  Obstruction, status post colectomy EXAM: ABDOMEN - 2 VIEW COMPARISON:  11/21/2021 FINDINGS: Insert gastric tube with the tip projecting over the stomach. Distension of the small measuring up to 7 cm with contrast material within the small. Small amount in the colon. No evidence of pneumoperitoneum, portal venous gas or pneumatosis. No pathologic calcifications along the expected course of the ureters. No acute osseous abnormality. IMPRESSION: 1. Persistent small bowel distension which may reflect persistent small bowel obstruction versus ileus. 2. Nasogastric tube with the tip projecting over the stomach. Electronically Signed   By: Kathreen Devoid M.D.   On: 11/23/2021 08:24   DG  Abd Portable 1V-Small Bowel Obstruction Protocol-initial, 8 hr delay  Result Date: 11/22/2021 CLINICAL DATA:  Small bowel obstruction protocol. 8 hour delayed film. EXAM: PORTABLE ABDOMEN - 1 VIEW COMPARISON:  November 21, 2021 FINDINGS: Sub pathologic small-bowel distention. The distal small bowel loops are opacified. However no contrast is seen within the functional right colon. IMPRESSION: Sub pathologic small-bowel distention. No contrast is seen within the functional right colon, suggestive of distal small bowel obstruction. Electronically Signed   By: Fidela Salisbury M.D.   On: 11/22/2021 15:32   DG Abdomen 1 View  Result Date: 11/21/2021 CLINICAL DATA:  NG tube placement. EXAM: ABDOMEN - 1 VIEW COMPARISON:  CT abdomen and pelvis 11/21/2021 FINDINGS: An enteric tube has been placed and terminates in the expected region of the gastric body with side port near the GE junction. Residual or contrast material is noted in multiple small bowel loops in the included portion of the abdomen. The included lung bases are grossly clear. IMPRESSION: Enteric tube in the proximal stomach with side port near the GE junction. Electronically Signed   By: Logan Bores M.D.   On: 11/21/2021 21:52   CT Abdomen Pelvis W Contrast  Result Date: 11/21/2021 CLINICAL DATA:  Abdominal pain, possible bowel obstruction EXAM: CT ABDOMEN AND PELVIS WITH CONTRAST TECHNIQUE: Multidetector CT imaging of the abdomen and pelvis was performed using the standard protocol following bolus administration of intravenous contrast. RADIATION DOSE REDUCTION: This exam was performed according to the departmental dose-optimization program which includes automated exposure control, adjustment of the mA and/or kV according to patient size and/or use of iterative reconstruction technique. CONTRAST:  36m OMNIPAQUE IOHEXOL 300 MG/ML  SOLN COMPARISON:  Previous studies including the examination done earlier today FINDINGS: Lower chest: Heart is  enlarged in size. There is no focal consolidation in the visualized lower lung fields. Hepatobiliary: Liver is not enlarged. There is no dilation of bile ducts. There is high density in the lumen of gallbladder, most likely due to vicarious contrast excretion in the bile from previous CT. Pancreas: No focal abnormalities are seen. Spleen: Spleen is not enlarged. Adrenals/Urinary Tract:  Adrenals are unremarkable. There is no hydronephrosis. There is 1.6 cm cyst in the lower pole of right kidney. Ureters are not dilated. There is contrast in the collecting systems and bladder from previous CT. Stomach/Bowel: Small hiatal hernia is seen. Stomach is unremarkable. Proximal small bowel loops are not dilated. There is dilation of distal small bowel loops in lower abdomen and pelvis measuring up to 3.3 cm in diameter. There is fecalization in ileal loop slightly to the right of midline. There is change in caliber of small bowel loops in right lower abdomen with decompression of distal and terminal ileum. There is wall thickening in some of the dilated small bowel loops in lower abdomen. Appendix is not dilated. Numerous diverticula are seen in colon. There is transverse colostomy in left periumbilical region. Left colon is not seen and appears to have been removed. Vascular/Lymphatic: Scattered arterial calcifications are seen. No new significant lymphadenopathy is seen. Reproductive: Prostate is not seen. Other: There is interval appearance of small ascites. There is no pneumoperitoneum. There is fluid density in the right inguinal canal. Right inguinal hernia containing fat is seen. Musculoskeletal: Degenerative changes are noted in lower thoracic spine and lumbar spine. IMPRESSION: There is interval worsening of small bowel dilation in lower abdomen and pelvis. There is wall thickening in some of the dilated small bowel loops. There is fecalization in distal ileum with change in caliber in right lower abdomen. Findings  suggest partial small bowel obstruction with interval worsening. This may be due to adhesions. There is interval appearance of small ascites which may be due to small-bowel obstruction. There is wall thickening in some of the dilated distal small bowel loops suggesting possible enteritis. There is no hydronephrosis. Appendix is not dilated. Diverticulosis of colon. 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 Other findings as described in the body of the report. Electronically Signed   By: Elmer Picker M.D.   On: 11/21/2021 20:13               LOS: 2 days   Abigial Newville  [facility]   Pager on [email or web address]. If 7PM-7AM, please contact night-coverage at [email or web address]     11/23/2021, 11:31 AM

## 2021-11-23 NOTE — Progress Notes (Signed)
Dr. Mal Loeta Herst updated with rechecked BP. Patient heading to surgery. No new orders at this time. SDS aware of BP prior to leaving the unit.   Fuller Mandril, RN     11/23/21 1058  Vitals  BP (!) 190/70  MAP (mmHg) 104  BP Method Automatic  Pulse Rate 96  MEWS COLOR  MEWS Score Color Green  MEWS Score  MEWS Temp 0  MEWS Systolic 0  MEWS Pulse 0  MEWS RR 0  MEWS LOC 0  MEWS Score 0

## 2021-11-23 NOTE — Anesthesia Procedure Notes (Signed)
Procedure Name: Intubation Date/Time: 11/23/2021 12:01 PM  Performed by: Johnna Acosta, CRNAPre-anesthesia Checklist: Patient identified, Emergency Drugs available, Suction available, Patient being monitored and Timeout performed Patient Re-evaluated:Patient Re-evaluated prior to induction Oxygen Delivery Method: Circle system utilized Preoxygenation: Pre-oxygenation with 100% oxygen Induction Type: IV induction, Rapid sequence and Cricoid Pressure applied Laryngoscope Size: McGraph and 3 Grade View: Grade I Tube type: Oral Tube size: 7.5 mm Number of attempts: 1 Airway Equipment and Method: Stylet and Video-laryngoscopy Placement Confirmation: ETT inserted through vocal cords under direct vision, positive ETCO2 and breath sounds checked- equal and bilateral Secured at: 22 cm Tube secured with: Tape Dental Injury: Teeth and Oropharynx as per pre-operative assessment

## 2021-11-23 NOTE — Care Management Important Message (Signed)
Important Message  Patient Details  Name: Willie [name] MRN: [id number] Date of Birth: [birth year]   Medicare Important Message Given:  Yes     Loann Quill 11/23/2021, 1:36 PM

## 2021-11-23 NOTE — Progress Notes (Signed)
Patient ID: Willie [name], male   DOB: 22-Nov-1940, 81 y.o.   MRN: [id number]     [facility] Day(s): 2.   Interval History: Patient seen and examined, no acute events or new complaints overnight. Patient reports pain in the abdomen when he was taken to the x-ray this morning.  He was also nauseated at this moment.  He got nausea medication and he felt better.   Abdominal x-ray 2 view shows persistent small bowel dilation without contrast getting into the large intestine.  Vital signs in last 24 hours: [min-max] current  Temp:  [98 F (36.7 C)-99.1 F (37.3 C)] 99.1 F (37.3 C) (09/15 0804) Pulse Rate:  [79-108] 108 (09/15 0804) Resp:  [16-18] 18 (09/15 0804) BP: (172-194)/(56-88) 179/80 (09/15 0804) SpO2:  [97 %-99 %] 97 % (09/15 0804) Weight:  [76.7 kg] 76.7 kg (09/14 1100)     Height: '5\' 3"'$  (160 cm) Weight: 76.7 kg BMI (Calculated): 29.94   Physical Exam:  Constitutional: alert, cooperative and no distress  Respiratory: breathing non-labored at rest  Cardiovascular: regular rate and sinus rhythm  Gastrointestinal: soft, non-tender, and distended colostomy pink and patent without gas in the bag.  Labs:     Latest Ref Rng & Units 11/22/2021    4:09 AM 11/21/2021    3:46 PM 11/21/2021    9:39 AM  CBC  WBC 4.0 - 10.5 K/uL 4.5  6.7  3.8   Hemoglobin 13.0 - 17.0 g/dL 14.1  15.4  13.7   Hematocrit 39.0 - 52.0 % 42.3  45.9  41.5   Platelets 150 - 400 K/uL 235  235  207       Latest Ref Rng & Units 11/23/2021    4:09 AM 11/22/2021    4:09 AM 11/21/2021    6:24 PM  CMP  Glucose 70 - 99 mg/dL 159  160  171   BUN 8 - 23 mg/dL '16  22  23   '$ Creatinine 0.61 - 1.24 mg/dL 1.22  1.29  1.34   Sodium 135 - 145 mmol/L 138  138  136   Potassium 3.5 - 5.1 mmol/L 4.2  4.3  4.2   Chloride 98 - 111 mmol/L 102  108  106   CO2 22 - 32 mmol/L '21  22  19   '$ Calcium 8.9 - 10.3 mg/dL 8.6  8.4  8.6   Total Protein 6.5 - 8.1 g/dL   7.5   Total Bilirubin 0.3 - 1.2 mg/dL   0.5    Alkaline Phos 38 - 126 U/L   72   AST 15 - 41 U/L   22   ALT 0 - 44 U/L   16     Imaging studies: Follow-up x-ray with Gastrografin 8 hours without contrast reaching the large intestine.  Abdominal x-ray to be repeated this morning with persistent small bowel dilation without contrast getting into the large intestine 24-hour after administration of Gastrografin.  I personally evaluated the images.   Assessment/Plan:  81 y.o. male with suspected partial SBO, complicated by pertinent comorbidities including rectal cancer.  Patient continue with persistent SBO.  Clinically there is no gas or stool in the bag.  Radiologically patient with persistent small bowel dilation and a contour in the large intestine.  I discussed with patient this finding and the fact that the chances of spontaneous resolution are decreasing.  I do recommend to proceed with surgical intervention.  Patient and his nephew requested transfer to [facility]  due to his previous surgery being at [facility].  I did the transfer communication with [facility] but it was declined due to capacity.  I provided the patient the documentation of the decline of the transfer due to capacity.  I also provided the patient with the results of the abdominal x-ray with persistent small bowel dilation and bowel obstruction.  I discussed with patient surgical intubation.  I will try to start diagnostic laparoscopy, robotic assisted.  But patient understand that if the obstruction cannot be resolved with minimal invasive approach he will need exploratory laparotomy.  I discussed with patient the risk of surgery includes bleeding, infection, injury to other organs or intestines, intestinal fistula, risk of anesthesia, among others.  The patient reported he understood and agreed to proceed.  Arnold Long, MD

## 2021-11-23 NOTE — Progress Notes (Signed)
Dr. Mal  aware of BP. PRN med ordered and given.     11/23/21 0804  Vitals  Temp 99.1 F (37.3 C)  Temp Source Oral  BP (!) 179/80  MAP (mmHg) 105  BP Location Left Arm  BP Method Automatic  Patient Position (if appropriate) Lying  Pulse Rate (!) 108  Pulse Rate Source Monitor  Resp 18  MEWS COLOR  MEWS Score Color Green  Oxygen Therapy  SpO2 97 %  MEWS Score  MEWS Temp 0  MEWS Systolic 0  MEWS Pulse 1  MEWS RR 0  MEWS LOC 0  MEWS Score 1

## 2021-11-23 NOTE — Op Note (Signed)
Preoperative diagnosis: Small bowel obstruction.  Postoperative diagnosis: Small bowel obstruction .  Procedure: Robotic assisted laparoscopic extensive enterolysis.  Anesthesia: GETA  Surgeon: Dr. Windell Moment, MD  Wound Classification: Clean    Indications: Patient is a 81 y.o. male with history of low anterior resection, who presented 2 days ago with picture consistent with partial small bowel obstruction that unresponsive to conservative measures. Gastrografin did not reached the large intestine even 24 hours after administration.    Findings: Abundant amount of adhesions to points on the ileum causing obstruction. Every difficult and time-consuming lysis of adhesion of dense scar tissue was needed to be done to release all the small bowel and make sure that no other pathology were identified. No gross metastatic disease was identified intraoperatively Adequate hemostasis  Description of procedure: The patient was placed in the supine position and general endotracheal anesthesia was induced. Preoperative antibiotics were given. The abdomen was prepped and draped in the usual sterile fashion. A timeout was completed verifying correct patient, procedure, site, positioning, and implant(s) and/or special equipment prior to beginning this procedure.  The colostomy was closed with 2-0 Vicryl with a pursestring.  Deep was done to prevent any spillage from gas or stool to contaminate the field.  Veress needle was inserted in the Palmer's point.  Abdominal cavity was insufflated to 15 mm Hg.  Abdominal cavity was entered in an Optiview fashion through the left upper quadrant.  3 other 8 mm trocars were inserted through the upper abdomen.  The [employer] robot was brought to the field and docked.  Upon inspection of the abdomen there was abundant amount of adhesions from intestine, omentum and mesentery to the abdominal wall.  A very difficult and time-consuming sharp dissection was done with scissors.   This led me identified the cecum and the terminal ileum and I was able to run the bowel from the terminal ileum to the first transition point from a collapsed ileum to a distended ileum.  A band of adhesion was identified that point was cutted.  I then continue inspecting the small intestine proximally until another point of transition of proximal elbow of the intestine attached to the abdominal wall.  I was also able to release this adhesion from the small intestine to the abdominal wall relieving and alert plan of transition of obstruction.  Then I continued to do abundant amount of lysis of adhesion of the small intestine to be able to inspect the whole small intestine up to ligament of Treitz.  The case was also very difficult since the colostomy was on the field and I had to move around the colostomy to be able to release all the small intestine adhesions.  No injury to the colostomy was identified intraoperatively.  2 small serosal tears were repaired with 2-0 Vicryl. One more time, the entire small bowel was then inspected for viability and the absence of any additional obstructing bands. Small bowel content was milked proximally to be aspirated by the nasogastric tube and distally into the colon, again confirming patency and integrity throughout its entire length.  The [employer] robot was undocked. The pneumoperitoneum was evacuated.  The skin was closed with Monocryl.  The stitch from the colostomy was removed.  The colostomy was digitalized and found patent. The patient tolerated the procedure well and was taken to the postanesthesia care unit in stable condition.   Specimen: None  Complications: None  Estimated Blood Loss: 50 mL

## 2021-11-23 NOTE — Transfer of Care (Signed)
Immediate Anesthesia Transfer of Care Note  Patient: Willie [name]  Procedure(s) Performed: XI ROBOT ASSISTED DIAGNOSTIC LAPAROSCOPY (Abdomen)  Patient Location: PACU  Anesthesia Type:General  Level of Consciousness: awake and drowsy  Airway & Oxygen Therapy: Patient Spontanous Breathing and Patient connected to face mask oxygen  Post-op Assessment: Report given to RN and Post -op Vital signs reviewed and stable  Post vital signs: Reviewed and stable  Last Vitals:  Vitals Value Taken Time  BP 166/70 11/23/21 1555  Temp 36.2 C 11/23/21 1552  Pulse 102 11/23/21 1555  Resp 21 11/23/21 1555  SpO2 100 % 11/23/21 1555    Last Pain:  Vitals:   11/23/21 1119  TempSrc: Temporal  PainSc: 4       Patients Stated Pain Goal: 0 (05/03/34 1224)  Complications: No notable events documented.

## 2021-11-23 NOTE — Anesthesia Preprocedure Evaluation (Addendum)
Anesthesia Evaluation  Patient identified by MRN, date of birth, ID band Patient awake    Reviewed: Allergy & Precautions, NPO status , Patient's Chart, lab work & pertinent test results  History of Anesthesia Complications Negative for: history of anesthetic complications  Airway Mallampati: III  TM Distance: >3 FB Neck ROM: full    Dental  (+) Chipped, Poor Dentition, Missing   Pulmonary neg pulmonary ROS, neg shortness of breath,    Pulmonary exam normal        Cardiovascular Exercise Tolerance: Good hypertension, (-) angina+ CAD   Rhythm:irregular Rate:Tachycardia     Neuro/Psych negative neurological ROS  negative psych ROS   GI/Hepatic Neg liver ROS, hiatal hernia, GERD  Controlled,  Endo/Other  negative endocrine ROSdiabetes, Type 2  Renal/GU Renal disease     Musculoskeletal   Abdominal   Peds  Hematology negative hematology ROS (+)   Anesthesia Other Findings Past Medical History: No date: Anemia No date: Coronary artery disease No date: Diabetes mellitus without complication (HCC) No date: GERD (gastroesophageal reflux disease) No date: History of hiatal hernia No date: Hypercholesterolemia No date: Hypertension 2000: Prostate cancer ([place])     Comment:  Prostatectomy.  09/2018: 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[place])     Comment:  Chemo tx's.  No date: Wears dentures     Comment:  partial lower  Past Surgical History: 10/12/2019: CATARACT EXTRACTION W/PHACO; Right     Comment:  Procedure: CATARACT EXTRACTION PHACO AND INTRAOCULAR               LENS PLACEMENT (IOC) RIGHT DIABETIC 6.35 00:37.1;                Surgeon: Birder Robson, MD;  Location: [place];  Service: Ophthalmology;  Laterality: Right; 05/02/2021: COLONOSCOPY; N/A     Comment:  Procedure: COLONOSCOPY;  Surgeon: [name], [name], MD;              Location: [facility];  Service: Gastroenterology;                 Laterality: N/A;  IDDM 07/15/2018: COLONOSCOPY WITH PROPOFOL; N/A     Comment:  Procedure: COLONOSCOPY WITH PROPOFOL;  Surgeon: [name],               [name], MD;  Location: [facility];  Service:               Gastroenterology;  Laterality: N/A; 10/12/2018: ESOPHAGOGASTRODUODENOSCOPY; N/A     Comment:  Procedure: ESOPHAGOGASTRODUODENOSCOPY (EGD);  Surgeon:               [name], MD;  Location: [facility];                Service: Gastroenterology;  Laterality: N/A; 07/15/2018: ESOPHAGOGASTRODUODENOSCOPY (EGD) WITH PROPOFOL; N/A     Comment:  Procedure: ESOPHAGOGASTRODUODENOSCOPY (EGD) WITH               PROPOFOL;  Surgeon: [name], [name], MD;  Location:               [facility];  Service: Gastroenterology;  Laterality:               N/A; 08/13/2018: PORTACATH PLACEMENT; N/A     Comment:  Procedure: INSERTION PORT-A-CATH;  Surgeon:               [name], MD;  Location: [facility];  Service:  General;  Laterality: N/A; No date: PROSTATE SURGERY 05/12/2019: ROBOT ASSISTED LAPAROSCOPIC PARTIAL COLECTOMY     Comment:  [place]  BMI    Body Mass Index: 29.95 kg/m      Reproductive/Obstetrics negative OB ROS                            Anesthesia Physical Anesthesia Plan  ASA: 4  Anesthesia Plan: General ETT and Rapid Sequence   Post-op Pain Management:    Induction: Intravenous  PONV Risk Score and Plan: Ondansetron, Dexamethasone, Midazolam and Treatment may vary due to age or medical condition  Airway Management Planned: Oral ETT and Video Laryngoscope Planned  Additional Equipment:   Intra-op Plan:   Post-operative Plan: Extubation in OR and Possible Post-op intubation/ventilation  Informed Consent: I have reviewed the patients History and Physical, chart, labs and discussed the procedure including the risks, benefits and alternatives for the proposed anesthesia with the patient or authorized representative  who has indicated his/her understanding and acceptance.   Patient has DNR.  Discussed DNR with patient and Suspend DNR.   Dental Advisory Given  Plan Discussed with: Anesthesiologist, CRNA and Surgeon  Anesthesia Plan Comments: (Patient consented for risks of anesthesia including but not limited to:  - adverse reactions to medications - damage to eyes, teeth, lips or other oral mucosa - nerve damage due to positioning  - sore throat or hoarseness - Damage to heart, brain, nerves, lungs, other parts of body or loss of life  Patient voiced understanding.)        Anesthesia Quick Evaluation

## 2021-11-24 ENCOUNTER — Encounter: Payer: Self-pay | Admitting: General Surgery

## 2021-11-24 DIAGNOSIS — N179 Acute kidney failure, unspecified: Secondary | ICD-10-CM | POA: Diagnosis not present

## 2021-11-24 DIAGNOSIS — Z933 Colostomy status: Secondary | ICD-10-CM | POA: Diagnosis not present

## 2021-11-24 DIAGNOSIS — K56600 Partial intestinal obstruction, unspecified as to cause: Secondary | ICD-10-CM | POA: Diagnosis not present

## 2021-11-24 DIAGNOSIS — I160 Hypertensive urgency: Secondary | ICD-10-CM | POA: Diagnosis not present

## 2021-11-24 LAB — GLUCOSE, CAPILLARY
Glucose-Capillary: 120 mg/dL — ABNORMAL HIGH (ref 70–99)
Glucose-Capillary: 143 mg/dL — ABNORMAL HIGH (ref 70–99)
Glucose-Capillary: 143 mg/dL — ABNORMAL HIGH (ref 70–99)
Glucose-Capillary: 182 mg/dL — ABNORMAL HIGH (ref 70–99)
Glucose-Capillary: 144 mg/dL — ABNORMAL HIGH (ref 70–99)

## 2021-11-24 MED ORDER — LISINOPRIL 10 MG PO TABS
10.0000 mg | ORAL_TABLET | Freq: Every day | ORAL | Status: DC
  Administered 2021-11-24 – 2021-11-29 (×6): 10 mg via ORAL
  Filled 2021-11-24 (×7): qty 1

## 2021-11-24 MED ORDER — METOPROLOL SUCCINATE ER 50 MG PO TB24
50.0000 mg | ORAL_TABLET | Freq: Every day | ORAL | Status: DC
  Administered 2021-11-24 – 2021-11-29 (×6): 50 mg via ORAL
  Filled 2021-11-24 (×7): qty 1

## 2021-11-24 MED ORDER — SIMETHICONE 80 MG PO CHEW
80.0000 mg | CHEWABLE_TABLET | Freq: Four times a day (QID) | ORAL | Status: DC | PRN
  Administered 2021-11-24 – 2021-11-26 (×5): 80 mg via ORAL
  Filled 2021-11-24 (×5): qty 1

## 2021-11-24 MED ORDER — OXYCODONE HCL 5 MG PO TABS
5.0000 mg | ORAL_TABLET | Freq: Three times a day (TID) | ORAL | Status: DC | PRN
  Administered 2021-11-24 – 2021-11-25 (×3): 5 mg via ORAL
  Filled 2021-11-24 (×3): qty 1

## 2021-11-24 MED ORDER — ROSUVASTATIN CALCIUM 10 MG PO TABS
10.0000 mg | ORAL_TABLET | Freq: Every day | ORAL | Status: DC
  Administered 2021-11-24 – 2021-11-29 (×6): 10 mg via ORAL
  Filled 2021-11-24 (×6): qty 1

## 2021-11-24 MED ORDER — TRAZODONE HCL 50 MG PO TABS
50.0000 mg | ORAL_TABLET | Freq: Every evening | ORAL | Status: DC | PRN
  Administered 2021-11-24 – 2021-11-28 (×4): 50 mg via ORAL
  Filled 2021-11-24 (×4): qty 1

## 2021-11-24 NOTE — Progress Notes (Signed)
End of shift note:  1500-1900  Pt's NG tube was discontinue by the surgeon this afternoon. Colostomy bag was emptied and it had 50 cc for output. Prn Roxicodone was given for pain and simethicone for gas. VSS. Blood glucose checked and coverage given

## 2021-11-24 NOTE — Progress Notes (Signed)
Patient ID: Willie [name], male   DOB: 07-17-1940, 81 y.o.   MRN: [id number]  Surgery Follow Up Note  Patient re evaluated this afternoon. Continue having colostomy output. Tolerated clear liquid. Will discontinue NGT and advance diet to full liquid.     Encourage the patient to ambulate.   [name], MD, FACS

## 2021-11-24 NOTE — Plan of Care (Signed)
  Problem: Coping: Goal: Ability to adjust to condition or change in health will improve Outcome: Progressing   

## 2021-11-24 NOTE — Plan of Care (Signed)
Patient axox4, NG in place and clamped. Standby assist. Colostomy has put out some air and has small amount of stool to bag not enough to empti and accurately measure. No c/o pain. Problem: Education: Goal: Ability to describe self-care measures that may prevent or decrease complications (Diabetes Survival Skills Education) will improve Outcome: Progressing Goal: Individualized Educational Video(s) Outcome: Progressing   Problem: Coping: Goal: Ability to adjust to condition or change in health will improve Outcome: Progressing   Problem: Fluid Volume: Goal: Ability to maintain a balanced intake and output will improve Outcome: Progressing   Problem: Health Behavior/Discharge Planning: Goal: Ability to identify and utilize available resources and services will improve Outcome: Progressing Goal: Ability to manage health-related needs will improve Outcome: Progressing   Problem: Metabolic: Goal: Ability to maintain appropriate glucose levels will improve Outcome: Progressing   Problem: Nutritional: Goal: Maintenance of adequate nutrition will improve Outcome: Progressing Goal: Progress toward achieving an optimal weight will improve Outcome: Progressing   Problem: Skin Integrity: Goal: Risk for impaired skin integrity will decrease Outcome: Progressing   Problem: Tissue Perfusion: Goal: Adequacy of tissue perfusion will improve Outcome: Progressing   Problem: Health Behavior/Discharge Planning: Goal: Ability to manage health-related needs will improve Outcome: Progressing   Problem: Education: Goal: Knowledge of General Education information will improve Description: Including pain rating scale, medication(s)/side effects and non-pharmacologic comfort measures Outcome: Progressing   Problem: Clinical Measurements: Goal: Ability to maintain clinical measurements within normal limits will improve Outcome: Progressing Goal: Will remain free from infection Outcome:  Progressing Goal: Diagnostic test results will improve Outcome: Progressing Goal: Respiratory complications will improve Outcome: Progressing Goal: Cardiovascular complication will be avoided Outcome: Progressing   Problem: Activity: Goal: Risk for activity intolerance will decrease Outcome: Progressing   Problem: Nutrition: Goal: Adequate nutrition will be maintained Outcome: Progressing   Problem: Coping: Goal: Level of anxiety will decrease Outcome: Progressing   Problem: Elimination: Goal: Will not experience complications related to bowel motility Outcome: Progressing Goal: Will not experience complications related to urinary retention Outcome: Progressing   Problem: Pain Managment: Goal: General experience of comfort will improve Outcome: Progressing   Problem: Safety: Goal: Ability to remain free from injury will improve Outcome: Progressing   Problem: Skin Integrity: Goal: Risk for impaired skin integrity will decrease Outcome: Progressing

## 2021-11-24 NOTE — Progress Notes (Signed)
Bedside report given by Lorrin Jackson.

## 2021-11-24 NOTE — Progress Notes (Signed)
Patient ID: Willie [name], male   DOB: 1940-10-11, 81 y.o.   MRN: [id number]     [facility] Hospital Day(s): 3.   Interval History: Patient seen and examined, no acute events or new complaints overnight. Patient reports feeling well. Endorses pain is controlled. No nausea. Some gas and stool in bag.   Vital signs in last 24 hours: [min-max] current  Temp:  [97.1 F (36.2 C)-99.1 F (37.3 C)] 98.5 F (36.9 C) (09/16 0804) Pulse Rate:  [31-102] 89 (09/16 0804) Resp:  [4-21] 18 (09/16 0804) BP: (141-190)/(53-110) 165/61 (09/16 0804) SpO2:  [96 %-100 %] 96 % (09/16 0804) Weight:  [76.7 kg] 76.7 kg (09/15 1119)     Height: '5\' 3"'$  (160 cm) Weight: 76.7 kg BMI (Calculated): 29.96   Physical Exam:  Constitutional: alert, cooperative and no distress  Respiratory: breathing non-labored at rest  Cardiovascular: regular rate and sinus rhythm  Gastrointestinal: soft, non-tender, and non-distended. Wounds are dry and clean. Stool and gas in bag.   Labs:     Latest Ref Rng & Units 11/22/2021    4:09 AM 11/21/2021    3:46 PM 11/21/2021    9:39 AM  CBC  WBC 4.0 - 10.5 K/uL 4.5  6.7  3.8   Hemoglobin 13.0 - 17.0 g/dL 14.1  15.4  13.7   Hematocrit 39.0 - 52.0 % 42.3  45.9  41.5   Platelets 150 - 400 K/uL 235  235  207       Latest Ref Rng & Units 11/23/2021    4:09 AM 11/22/2021    4:09 AM 11/21/2021    6:24 PM  CMP  Glucose 70 - 99 mg/dL 159  160  171   BUN 8 - 23 mg/dL '16  22  23   '$ Creatinine 0.61 - 1.24 mg/dL 1.22  1.29  1.34   Sodium 135 - 145 mmol/L 138  138  136   Potassium 3.5 - 5.1 mmol/L 4.2  4.3  4.2   Chloride 98 - 111 mmol/L 102  108  106   CO2 22 - 32 mmol/L '21  22  19   '$ Calcium 8.9 - 10.3 mg/dL 8.6  8.4  8.6   Total Protein 6.5 - 8.1 g/dL   7.5   Total Bilirubin 0.3 - 1.2 mg/dL   0.5   Alkaline Phos 38 - 126 U/L   72   AST 15 - 41 U/L   22   ALT 0 - 44 U/L   16     Imaging studies: No new pertinent imaging studies   Assessment/Plan:  81 y.o. male with  small bowel obstruction 1 Day Post-Op s/p robotic assisted laparoscopic enterolysis.  -adequate vitals signs. No fever -Pain controlled -Stool and gas in colostomy bag. Will do NGT clamp trial and clear liquid diet trial -Encourage the patient to ambulate  Arnold Long, MD

## 2021-11-24 NOTE — Progress Notes (Addendum)
Progress Note    Willie [name]  ZOX:[id number] DOB: 1941/01/10  DOA: 11/21/2021 PCP: [name], MD      Brief Narrative:    Medical records reviewed and are as summarized below:  Willie [name] is a 81 y.o. male  with medical history significant for DM, HTN, CAD, rectal cancer s/p colectomy, chemotherapy and XRT, with colostomy bag, prior admissions for small bowel obstruction most recently from 7/16 to 09/27/2021.  He presented to the hospital with 24-hour history of abdominal pain, abdominal distention, vomiting, lack of output in the colostomy bag.  He had gone for routine surveillance CT chest, abdomen and pelvis with contrast prior to admission and he believes this triggered his symptoms.    He was admitted to the hospital for partial small bowel obstruction that was evident on repeat CT abdomen and pelvis.    Assessment/Plan:   Principal Problem:   Partial small bowel obstruction (HCC) Active Problems:   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HCC)   Hypertensive urgency   AKI (acute kidney injury) ([place])   History of NSVT (nonsustained ventricular tachycardia) (HCC)   CAD (coronary artery disease)   Controlled type 2 diabetes mellitus without complication, with long-term current use of insulin (HCC)   Colostomy status (HCC)    Body mass index is 29.95 kg/m.  Small bowel obstruction, history of recurrent SBO with recent hospitalization from 09/23/2021 through 09/27/2021 for SBO: S/p robotic assisted laparoscopic extensive enterolysis on 11/23/2021.  He has been started on clear liquid diet.  Discontinue IV fluids.  AKI with non-anion gap metabolic acidosis: Improved.   Hypertensive urgency: BP is better.  Resume home lisinopril and metoprolol.   Type II DM: Use NovoLog as needed for hyperglycemia  History of NSVT: Restart metoprolol History of rectal cancer s/p colectomy, chemotherapy and radiation therapy, colostomy status: Outpatient follow-up with oncologist    Diet  Order             Diet clear liquid Room service appropriate? Yes; Fluid consistency: Thin  Diet effective now                            Consultants: General surgeon  Procedures: Robotic assisted laparoscopic extensive enterolysis 11/23/2021    Medications:    brimonidine  1 drop Both Eyes BID   enoxaparin (LOVENOX) injection  40 mg Subcutaneous Q24H   influenza vaccine adjuvanted  0.5 mL Intramuscular Tomorrow-1000   insulin aspart  0-15 Units Subcutaneous Q4H   lisinopril  10 mg Oral Daily   metoprolol succinate  50 mg Oral Daily   Netarsudil-Latanoprost  1 drop Both Eyes QHS   pantoprazole (PROTONIX) IV  40 mg Intravenous Q24H   pneumococcal 20-valent conjugate vaccine  0.5 mL Intramuscular Tomorrow-1000   rosuvastatin  10 mg Oral Daily   Continuous Infusions:     Anti-infectives (From admission, onward)    Start     Dose/Rate Route Frequency Ordered Stop   11/23/21 1130  ceFAZolin (ANCEF) 2-4 GM/100ML-% IVPB       Note to Pharmacy: Norton Blizzard A: cabinet override      11/23/21 1130 11/23/21 1145              Family Communication/Anticipated D/C date and plan/Code Status   DVT prophylaxis: enoxaparin (LOVENOX) injection 40 mg Start: 11/21/21 2145     Code Status: DNR  Family Communication: Plan discussed with [place], nephew, at the bedside Disposition Plan: Plan to  discharge home in 2 to 3 days   Status is: Inpatient Remains inpatient appropriate because: Requires surgery for small bowel obstruction      Subjective:   Interval events noted.  He complains of soreness at the surgical site but no other issues.  No vomiting.  Objective:    Vitals:   11/23/21 1955 11/24/21 0406 11/24/21 0454 11/24/21 0804  BP: (!) 177/87 (!) 187/110 (!) 141/53 (!) 165/61  Pulse: 95 (!) 102 82 89  Resp: '16 16 20 18  '$ Temp: 99 F (37.2 C) 99.1 F (37.3 C)  98.5 F (36.9 C)  TempSrc: Oral Oral  Oral  SpO2: 98% 98%  96%  Weight:       Height:       No data found.   Intake/Output Summary (Last 24 hours) at 11/24/2021 1507 Last data filed at 11/24/2021 1140 Gross per 24 hour  Intake 1836.54 ml  Output 730 ml  Net 1106.54 ml   Filed Weights   11/21/21 2100 11/22/21 1100 11/23/21 1119  Weight: 76.2 kg 76.7 kg 76.7 kg    Exam:  GEN: NAD SKIN: Warm and dry EYES: No pallor or icterus ENT: MMM CV: RRR PULM: CTA B ABD: soft, ND, NT, +BS, + colostomy bag with small amount of yellow stools CNS: AAO x 3, non focal EXT: No edema or tenderness             Data Reviewed:   I have personally reviewed following labs and imaging studies:  Labs: Labs show the following:   Basic Metabolic Panel: Recent Labs  Lab 11/21/21 0939 11/21/21 1711 11/21/21 1824 11/22/21 0409 11/23/21 0409  NA 136  --  136 138 138  K 4.3  --  4.2 4.3 4.2  CL 105  --  106 108 102  CO2 26  --  19* 22 21*  GLUCOSE 166*  --  171* 160* 159*  BUN 28*  --  '23 22 16  '$ CREATININE 1.56*  --  1.34* 1.29* 1.22  CALCIUM 8.6*  --  8.6* 8.4* 8.6*  MG  --  2.1  --  2.1  --    GFR Estimated Creatinine Clearance: 42 mL/min (by C-G formula based on SCr of 1.22 mg/dL). Liver Function Tests: Recent Labs  Lab 11/21/21 0939 11/21/21 1824  AST 18 22  ALT 17 16  ALKPHOS 71 72  BILITOT 0.5 0.5  PROT 7.6 7.5  ALBUMIN 3.7 3.8   No results for input(s): "LIPASE", "AMYLASE" in the last 168 hours. No results for input(s): "AMMONIA" in the last 168 hours. Coagulation profile No results for input(s): "INR", "PROTIME" in the last 168 hours.  CBC: Recent Labs  Lab 11/21/21 0939 11/21/21 1546 11/22/21 0409  WBC 3.8* 6.7 4.5  NEUTROABS 2.3 5.7  --   HGB 13.7 15.4 14.1  HCT 41.5 45.9 42.3  MCV 90.0 87.8 86.5  PLT 207 235 235   Cardiac Enzymes: No results for input(s): "CKTOTAL", "CKMB", "CKMBINDEX", "TROPONINI" in the last 168 hours. BNP (last 3 results) No results for input(s): "PROBNP" in the last 8760 hours. CBG: Recent Labs   Lab 11/23/21 2140 11/23/21 2346 11/24/21 0400 11/24/21 0806 11/24/21 1141  GLUCAP 140* 136* 120* 143* 143*   D-Dimer: No results for input(s): "DDIMER" in the last 72 hours. Hgb A1c: Recent Labs    11/21/21 1546  HGBA1C 7.4*   Lipid Profile: No results for input(s): "CHOL", "HDL", "LDLCALC", "TRIG", "CHOLHDL", "LDLDIRECT" in the last 72 hours. Thyroid function  studies: No results for input(s): "TSH", "T4TOTAL", "T3FREE", "THYROIDAB" in the last 72 hours.  Invalid input(s): "FREET3" Anemia work up: No results for input(s): "VITAMINB12", "FOLATE", "FERRITIN", "TIBC", "IRON", "RETICCTPCT" in the last 72 hours. Sepsis Labs: Recent Labs  Lab 11/21/21 0939 11/21/21 1546 11/22/21 0409  WBC 3.8* 6.7 4.5    Microbiology No results found for this or any previous visit (from the past 240 hour(s)).  Procedures and diagnostic studies:  DG Abd 2 Views  Result Date: 11/23/2021 CLINICAL DATA:  Obstruction, status post colectomy EXAM: ABDOMEN - 2 VIEW COMPARISON:  11/21/2021 FINDINGS: Insert gastric tube with the tip projecting over the stomach. Distension of the small measuring up to 7 cm with contrast material within the small. Small amount in the colon. No evidence of pneumoperitoneum, portal venous gas or pneumatosis. No pathologic calcifications along the expected course of the ureters. No acute osseous abnormality. IMPRESSION: 1. Persistent small bowel distension which may reflect persistent small bowel obstruction versus ileus. 2. Nasogastric tube with the tip projecting over the stomach. Electronically Signed   By: Kathreen Devoid M.D.   On: 11/23/2021 08:24               LOS: 3 days   Ieesha Abbasi  [facility]   Pager on [email or web address]. If 7PM-7AM, please contact night-coverage at [email or web address]     11/24/2021, 3:07 PM

## 2021-11-25 DIAGNOSIS — N179 Acute kidney failure, unspecified: Secondary | ICD-10-CM | POA: Diagnosis not present

## 2021-11-25 DIAGNOSIS — K56600 Partial intestinal obstruction, unspecified as to cause: Secondary | ICD-10-CM | POA: Diagnosis not present

## 2021-11-25 DIAGNOSIS — Z933 Colostomy status: Secondary | ICD-10-CM | POA: Diagnosis not present

## 2021-11-25 LAB — GLUCOSE, CAPILLARY
Glucose-Capillary: 101 mg/dL — ABNORMAL HIGH (ref 70–99)
Glucose-Capillary: 112 mg/dL — ABNORMAL HIGH (ref 70–99)
Glucose-Capillary: 113 mg/dL — ABNORMAL HIGH (ref 70–99)
Glucose-Capillary: 177 mg/dL — ABNORMAL HIGH (ref 70–99)
Glucose-Capillary: 178 mg/dL — ABNORMAL HIGH (ref 70–99)
Glucose-Capillary: 187 mg/dL — ABNORMAL HIGH (ref 70–99)

## 2021-11-25 LAB — CBC WITH DIFFERENTIAL/PLATELET
Abs Immature Granulocytes: 0.03 10*3/uL (ref 0.00–0.07)
Basophils Absolute: 0 10*3/uL (ref 0.0–0.1)
Basophils Relative: 1 %
Eosinophils Absolute: 0.1 10*3/uL (ref 0.0–0.5)
Eosinophils Relative: 3 %
HCT: 37.2 % — ABNORMAL LOW (ref 39.0–52.0)
Hemoglobin: 12 g/dL — ABNORMAL LOW (ref 13.0–17.0)
Immature Granulocytes: 1 %
Lymphocytes Relative: 25 %
Lymphs Abs: 1.1 10*3/uL (ref 0.7–4.0)
MCH: 29.3 pg (ref 26.0–34.0)
MCHC: 32.3 g/dL (ref 30.0–36.0)
MCV: 90.7 fL (ref 80.0–100.0)
Monocytes Absolute: 0.5 10*3/uL (ref 0.1–1.0)
Monocytes Relative: 11 %
Neutro Abs: 2.6 10*3/uL (ref 1.7–7.7)
Neutrophils Relative %: 59 %
Platelets: 191 10*3/uL (ref 150–400)
RBC: 4.1 MIL/uL — ABNORMAL LOW (ref 4.22–5.81)
RDW: 14.3 % (ref 11.5–15.5)
WBC: 4.3 10*3/uL (ref 4.0–10.5)
nRBC: 0 % (ref 0.0–0.2)

## 2021-11-25 LAB — BASIC METABOLIC PANEL
Anion gap: 8 (ref 5–15)
BUN: 21 mg/dL (ref 8–23)
CO2: 26 mmol/L (ref 22–32)
Calcium: 8 mg/dL — ABNORMAL LOW (ref 8.9–10.3)
Chloride: 106 mmol/L (ref 98–111)
Creatinine, Ser: 1.47 mg/dL — ABNORMAL HIGH (ref 0.61–1.24)
GFR, Estimated: 47 mL/min — ABNORMAL LOW (ref >60)
Glucose, Bld: 107 mg/dL — ABNORMAL HIGH (ref 70–99)
Potassium: 3.6 mmol/L (ref 3.5–5.1)
Sodium: 140 mmol/L (ref 135–145)

## 2021-11-25 LAB — MAGNESIUM: Magnesium: 2.3 mg/dL (ref 1.7–2.4)

## 2021-11-25 MED ORDER — TRAMADOL HCL 50 MG PO TABS
50.0000 mg | ORAL_TABLET | Freq: Four times a day (QID) | ORAL | Status: AC | PRN
  Administered 2021-11-25: 50 mg via ORAL
  Filled 2021-11-25: qty 1

## 2021-11-25 MED ORDER — OXYCODONE HCL 5 MG PO TABS
5.0000 mg | ORAL_TABLET | Freq: Four times a day (QID) | ORAL | Status: DC | PRN
  Administered 2021-11-25 – 2021-11-28 (×7): 5 mg via ORAL
  Filled 2021-11-25 (×7): qty 1

## 2021-11-25 MED ORDER — PANTOPRAZOLE SODIUM 40 MG PO TBEC
40.0000 mg | DELAYED_RELEASE_TABLET | Freq: Every day | ORAL | Status: DC
  Administered 2021-11-25 – 2021-11-28 (×4): 40 mg via ORAL
  Filled 2021-11-25 (×4): qty 1

## 2021-11-25 NOTE — Progress Notes (Signed)
PHARMACIST - PHYSICIAN COMMUNICATION  CONCERNING: IV to Oral Route Change Policy  RECOMMENDATION: This patient is receiving pantoprazole by the intravenous route.  Based on criteria approved by the Pharmacy and Therapeutics Committee, the intravenous medication(s) is/are being converted to the equivalent oral dose form(s).   DESCRIPTION: These criteria include: The patient is eating (either orally or via tube) and/or has been taking other orally administered medications for a least 24 hours The patient has no evidence of active gastrointestinal bleeding or impaired GI absorption (gastrectomy, short bowel, patient on TNA or NPO).  If you have questions about this conversion, please contact the [place], [facility] 11/25/2021 10:53 AM

## 2021-11-25 NOTE — Progress Notes (Signed)
Progress Note    Willie [name]  ZRA:[id number] DOB: Aug 29, 1940  DOA: 11/21/2021 PCP: [name], MD      Brief Narrative:    Medical records reviewed and are as summarized below:  Willie [name] is a 81 y.o. male  with medical history significant for DM, HTN, CAD, rectal cancer s/p colectomy, chemotherapy and XRT, with colostomy bag, prior admissions for small bowel obstruction most recently from 7/16 to 09/27/2021.  He presented to the hospital with 24-hour history of abdominal pain, abdominal distention, vomiting, lack of output in the colostomy bag.  He had gone for routine surveillance CT chest, abdomen and pelvis with contrast prior to admission and he believes this triggered his symptoms.    He was admitted to the hospital for partial small bowel obstruction that was evident on repeat CT abdomen and pelvis.    Assessment/Plan:   Principal Problem:   Partial small bowel obstruction (HCC) Active Problems:   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HCC)   Hypertensive urgency   AKI (acute kidney injury) ([place])   History of NSVT (nonsustained ventricular tachycardia) (HCC)   CAD (coronary artery disease)   Controlled type 2 diabetes mellitus without complication, with long-term current use of insulin (HCC)   Colostomy status (HCC)    Body mass index is 29.95 kg/m.  Small bowel obstruction, history of recurrent SBO with recent hospitalization from 09/23/2021 through 09/27/2021 for SBO: S/p robotic assisted laparoscopic extensive enterolysis on 11/23/2021.  Diet has been advanced to soft diet by the surgeon.  Analgesics as needed for pain (oxycodone increased from 5 mg every 8 hours as needed to every 6 hours as needed for adequate pain control).  AKI with non-anion gap metabolic acidosis: Improved.   Hypertensive urgency: BP is better. Continue lisinopril and metoprolol.   Type II DM: Use NovoLog as needed for hyperglycemia  History of NSVT: Continue metoprolol  History of rectal  cancer s/p colectomy, chemotherapy and radiation therapy, colostomy status: Outpatient follow-up with oncologist    Diet Order             DIET SOFT Room service appropriate? Yes; Fluid consistency: Thin  Diet effective now                            Consultants: General surgeon  Procedures: Robotic assisted laparoscopic extensive enterolysis 11/23/2021    Medications:    brimonidine  1 drop Both Eyes BID   enoxaparin (LOVENOX) injection  40 mg Subcutaneous Q24H   insulin aspart  0-15 Units Subcutaneous Q4H   lisinopril  10 mg Oral Daily   metoprolol succinate  50 mg Oral Daily   Netarsudil-Latanoprost  1 drop Both Eyes QHS   pantoprazole  40 mg Oral QHS   rosuvastatin  10 mg Oral Daily   Continuous Infusions:     Anti-infectives (From admission, onward)    Start     Dose/Rate Route Frequency Ordered Stop   11/23/21 1130  ceFAZolin (ANCEF) 2-4 GM/100ML-% IVPB       Note to Pharmacy: Norton Blizzard A: cabinet override      11/23/21 1130 11/23/21 1145              Family Communication/Anticipated D/C date and plan/Code Status   DVT prophylaxis: enoxaparin (LOVENOX) injection 40 mg Start: 11/21/21 2145     Code Status: DNR  Family Communication: None Disposition Plan: Plan to discharge home in 1 to 2 days  Status is: Inpatient Remains inpatient appropriate because: S/p surgery for SBO     Subjective:   He complains of abdominal soreness.  No vomiting.  This was better.  Objective:    Vitals:   11/24/21 1952 11/25/21 0004 11/25/21 0500 11/25/21 0721  BP: (!) 171/66  (!) 140/60 (!) 159/62  Pulse: 63  67 66  Resp: 20 (!) 21  20  Temp: 98.5 F (36.9 C)  98.5 F (36.9 C) 98.6 F (37 C)  TempSrc: Oral  Oral Oral  SpO2: 97%  98% 98%  Weight:      Height:       No data found.   Intake/Output Summary (Last 24 hours) at 11/25/2021 1355 Last data filed at 11/25/2021 1300 Gross per 24 hour  Intake 200 ml  Output 750 ml   Net -550 ml   Filed Weights   11/21/21 2100 11/22/21 1100 11/23/21 1119  Weight: 76.2 kg 76.7 kg 76.7 kg    Exam:  GEN: NAD SKIN: No rash on exposed skin EYES: No pallor or icterus ENT: MMM CV: RRR PULM: CTA B ABD: soft, ND, mild generalized surgical tenderness, no rebound tenderness or guarding, +BS, + colostomy bag with yellowish stools CNS: AAO x 3, non focal EXT: No edema or tenderness              Data Reviewed:   I have personally reviewed following labs and imaging studies:  Labs: Labs show the following:   Basic Metabolic Panel: Recent Labs  Lab 11/21/21 0939 11/21/21 1711 11/21/21 1824 11/22/21 0409 11/23/21 0409 11/25/21 0549  NA 136  --  136 138 138 140  K 4.3  --  4.2 4.3 4.2 3.6  CL 105  --  106 108 102 106  CO2 26  --  19* 22 21* 26  GLUCOSE 166*  --  171* 160* 159* 107*  BUN 28*  --  '23 22 16 21  '$ CREATININE 1.56*  --  1.34* 1.29* 1.22 1.47*  CALCIUM 8.6*  --  8.6* 8.4* 8.6* 8.0*  MG  --  2.1  --  2.1  --  2.3   GFR Estimated Creatinine Clearance: 34.9 mL/min (A) (by C-G formula based on SCr of 1.47 mg/dL (H)). Liver Function Tests: Recent Labs  Lab 11/21/21 0939 11/21/21 1824  AST 18 22  ALT 17 16  ALKPHOS 71 72  BILITOT 0.5 0.5  PROT 7.6 7.5  ALBUMIN 3.7 3.8   No results for input(s): "LIPASE", "AMYLASE" in the last 168 hours. No results for input(s): "AMMONIA" in the last 168 hours. Coagulation profile No results for input(s): "INR", "PROTIME" in the last 168 hours.  CBC: Recent Labs  Lab 11/21/21 0939 11/21/21 1546 11/22/21 0409 11/25/21 0549  WBC 3.8* 6.7 4.5 4.3  NEUTROABS 2.3 5.7  --  2.6  HGB 13.7 15.4 14.1 12.0*  HCT 41.5 45.9 42.3 37.2*  MCV 90.0 87.8 86.5 90.7  PLT 207 235 235 191   Cardiac Enzymes: No results for input(s): "CKTOTAL", "CKMB", "CKMBINDEX", "TROPONINI" in the last 168 hours. BNP (last 3 results) No results for input(s): "PROBNP" in the last 8760 hours. CBG: Recent Labs  Lab  11/24/21 1956 11/25/21 0003 11/25/21 0458 11/25/21 0721 11/25/21 1153  GLUCAP 144* 187* 101* 112* 178*   D-Dimer: No results for input(s): "DDIMER" in the last 72 hours. Hgb A1c: No results for input(s): "HGBA1C" in the last 72 hours.  Lipid Profile: No results for input(s): "CHOL", "HDL", "LDLCALC", "TRIG", "CHOLHDL", "  LDLDIRECT" in the last 72 hours. Thyroid function studies: No results for input(s): "TSH", "T4TOTAL", "T3FREE", "THYROIDAB" in the last 72 hours.  Invalid input(s): "FREET3" Anemia work up: No results for input(s): "VITAMINB12", "FOLATE", "FERRITIN", "TIBC", "IRON", "RETICCTPCT" in the last 72 hours. Sepsis Labs: Recent Labs  Lab 11/21/21 0939 11/21/21 1546 11/22/21 0409 11/25/21 0549  WBC 3.8* 6.7 4.5 4.3    Microbiology No results found for this or any previous visit (from the past 240 hour(s)).  Procedures and diagnostic studies:  No results found.             LOS: 4 days   Nykeem Citro  [facility]   Pager on [email or web address]. If 7PM-7AM, please contact night-coverage at [email or web address]     11/25/2021, 1:55 PM

## 2021-11-25 NOTE — Plan of Care (Signed)
  Problem: Coping: Goal: Ability to adjust to condition or change in health will improve Outcome: Progressing   

## 2021-11-25 NOTE — Progress Notes (Signed)
Patient ID: Willie [name], male   DOB: 1940-05-17, 81 y.o.   MRN: [id number]     [place] Hospital Day(s): 4.   Interval History: Patient seen and examined, no acute events or new complaints overnight. Patient reports feeling well. Denies abdominal pain. Denies nausea. Tolerated full liquid diet.   Vital signs in last 24 hours: [min-max] current  Temp:  [98.4 F (36.9 C)-98.6 F (37 C)] 98.6 F (37 C) (09/17 0721) Pulse Rate:  [63-92] 66 (09/17 0721) Resp:  [18-21] 20 (09/17 0721) BP: (140-171)/(58-66) 159/62 (09/17 0721) SpO2:  [97 %-98 %] 98 % (09/17 0721)     Height: '5\' 3"'$  (160 cm) Weight: 76.7 kg BMI (Calculated): 29.96   Physical Exam:  Constitutional: alert, cooperative and no distress  Respiratory: breathing non-labored at rest  Cardiovascular: regular rate and sinus rhythm  Gastrointestinal: soft, non-tender, and non-distended. Colostomy pink and patent  Labs:     Latest Ref Rng & Units 11/25/2021    5:49 AM 11/22/2021    4:09 AM 11/21/2021    3:46 PM  CBC  WBC 4.0 - 10.5 K/uL 4.3  4.5  6.7   Hemoglobin 13.0 - 17.0 g/dL 12.0  14.1  15.4   Hematocrit 39.0 - 52.0 % 37.2  42.3  45.9   Platelets 150 - 400 K/uL 191  235  235       Latest Ref Rng & Units 11/25/2021    5:49 AM 11/23/2021    4:09 AM 11/22/2021    4:09 AM  CMP  Glucose 70 - 99 mg/dL 107  159  160   BUN 8 - 23 mg/dL '21  16  22   '$ Creatinine 0.61 - 1.24 mg/dL 1.47  1.22  1.29   Sodium 135 - 145 mmol/L 140  138  138   Potassium 3.5 - 5.1 mmol/L 3.6  4.2  4.3   Chloride 98 - 111 mmol/L 106  102  108   CO2 22 - 32 mmol/L '26  21  22   '$ Calcium 8.9 - 10.3 mg/dL 8.0  8.6  8.4     Imaging studies: No new pertinent imaging studies   Assessment/Plan:  81 y.o. male with small bowel obstruction 2 Day Post-Op s/p robotic assisted laparoscopic enterolysis.  Patient continue recovering adequately. Stable vital signs. No feer - Pain under controlled -Tolerated full liquid. Adequate colostomy  output. -Will advance diet to soft diet and assess for toleration. -Encourage the patient to ambulate.    Arnold Long, MD

## 2021-11-26 ENCOUNTER — Inpatient Hospital Stay: Payer: Medicare PPO | Admitting: Oncology

## 2021-11-26 ENCOUNTER — Telehealth: Payer: Self-pay

## 2021-11-26 DIAGNOSIS — E119 Type 2 diabetes mellitus without complications: Secondary | ICD-10-CM | POA: Diagnosis not present

## 2021-11-26 DIAGNOSIS — K56600 Partial intestinal obstruction, unspecified as to cause: Secondary | ICD-10-CM | POA: Diagnosis not present

## 2021-11-26 DIAGNOSIS — Z933 Colostomy status: Secondary | ICD-10-CM | POA: Diagnosis not present

## 2021-11-26 DIAGNOSIS — N179 Acute kidney failure, unspecified: Secondary | ICD-10-CM | POA: Diagnosis not present

## 2021-11-26 LAB — GLUCOSE, CAPILLARY
Glucose-Capillary: 116 mg/dL — ABNORMAL HIGH (ref 70–99)
Glucose-Capillary: 117 mg/dL — ABNORMAL HIGH (ref 70–99)
Glucose-Capillary: 119 mg/dL — ABNORMAL HIGH (ref 70–99)
Glucose-Capillary: 149 mg/dL — ABNORMAL HIGH (ref 70–99)
Glucose-Capillary: 154 mg/dL — ABNORMAL HIGH (ref 70–99)
Glucose-Capillary: 166 mg/dL — ABNORMAL HIGH (ref 70–99)
Glucose-Capillary: 175 mg/dL — ABNORMAL HIGH (ref 70–99)
Glucose-Capillary: 182 mg/dL — ABNORMAL HIGH (ref 70–99)

## 2021-11-26 MED ORDER — ALUM & MAG HYDROXIDE-SIMETH 200-200-20 MG/5ML PO SUSP
30.0000 mL | ORAL | Status: DC | PRN
  Administered 2021-11-26: 30 mL via ORAL
  Filled 2021-11-26: qty 30

## 2021-11-26 MED ORDER — SODIUM CHLORIDE 0.9 % IV SOLN
12.5000 mg | Freq: Three times a day (TID) | INTRAVENOUS | Status: DC | PRN
  Administered 2021-11-26: 12.5 mg via INTRAVENOUS
  Filled 2021-11-26: qty 12.5

## 2021-11-26 MED ORDER — SODIUM CHLORIDE 0.9 % IV SOLN: INTRAVENOUS | Status: DC | PRN

## 2021-11-26 MED ORDER — BISMUTH SUBSALICYLATE 262 MG PO CHEW
524.0000 mg | CHEWABLE_TABLET | Freq: Three times a day (TID) | ORAL | Status: DC
  Administered 2021-11-26 – 2021-11-28 (×8): 524 mg via ORAL
  Filled 2021-11-26 (×15): qty 2

## 2021-11-26 NOTE — Telephone Encounter (Signed)
Patient's nephew called to cancel/reschedule today's apt due to patient being in the hospital. Please call nephew to reschedule at [phone].

## 2021-11-26 NOTE — Progress Notes (Signed)
Mobility Specialist - Progress Note   11/26/21 1338  Mobility  Activity Ambulated with assistance in hallway;Stood at bedside;Dangled on edge of bed  Level of Assistance Standby assist, set-up cues, supervision of patient - no hands on  Assistive Device Front wheel walker  Distance Ambulated (ft) 640 ft  Activity Response Tolerated well  $Mobility charge 1 Mobility   Pt semi-supine on RA upon arrival. Pt STS with HHA and ambulates 4 laps around NS SBA. (No hands on). Pt returns to bed with needs in reach and family in room.   Gretchen Short  Mobility Specialist  11/26/21 1:39 PM

## 2021-11-26 NOTE — Progress Notes (Signed)
Patient ID: Willie [name], male   DOB: 09-22-1940, 81 y.o.   MRN: [id number]     [place] Hospital Day(s): 5.   Interval History: Patient seen and examined, no acute events or new complaints overnight. Patient reports he did not feel good yesterday.  During the day he developed some abdominal distention and nausea.  There was no vomiting.  Patient has some hiccups.  He did continue having some gas and colostomy output.  He was not able to tolerate soft diet.  Vital signs in last 24 hours: [min-max] current  Temp:  [98 F (36.7 C)-98.8 F (37.1 C)] 98 F (36.7 C) (09/18 0421) Pulse Rate:  [58-80] 58 (09/18 0421) Resp:  [18-20] 18 (09/18 0421) BP: (136-162)/(52-61) 136/61 (09/18 0421) SpO2:  [96 %-100 %] 97 % (09/18 0421)     Height: '5\' 3"'$  (160 cm) Weight: 76.7 kg BMI (Calculated): 29.96   Physical Exam:  Constitutional: alert, cooperative and no distress  Respiratory: breathing non-labored at rest  Cardiovascular: regular rate and sinus rhythm  Gastrointestinal: soft, non-tender, and mild-distended.  Colostomy pink and patent  Labs:     Latest Ref Rng & Units 11/25/2021    5:49 AM 11/22/2021    4:09 AM 11/21/2021    3:46 PM  CBC  WBC 4.0 - 10.5 K/uL 4.3  4.5  6.7   Hemoglobin 13.0 - 17.0 g/dL 12.0  14.1  15.4   Hematocrit 39.0 - 52.0 % 37.2  42.3  45.9   Platelets 150 - 400 K/uL 191  235  235       Latest Ref Rng & Units 11/25/2021    5:49 AM 11/23/2021    4:09 AM 11/22/2021    4:09 AM  CMP  Glucose 70 - 99 mg/dL 107  159  160   BUN 8 - 23 mg/dL '21  16  22   '$ Creatinine 0.61 - 1.24 mg/dL 1.47  1.22  1.29   Sodium 135 - 145 mmol/L 140  138  138   Potassium 3.5 - 5.1 mmol/L 3.6  4.2  4.3   Chloride 98 - 111 mmol/L 106  102  108   CO2 22 - 32 mmol/L '26  21  22   '$ Calcium 8.9 - 10.3 mg/dL 8.0  8.6  8.4     Imaging studies: No new pertinent imaging studies   Assessment/Plan:  81 y.o. male with small bowel obstruction 3 Day Post-Op s/p robotic assisted  laparoscopic enterolysis.  Developed some postoperative ileus yesterday with small bowel distention. -He does endorses feeling a little bit better this morning.  He still passing gas and some output through the colostomy.  Hopefully the watchers and intermittent ileus until he will be able to get better.  He did not try the soft diet yesterday.  We will see if he feels better this morning and he can try soft diet.  I encouraged the patient to ambulate.   Arnold Long, MD

## 2021-11-26 NOTE — Telephone Encounter (Signed)
Please call.

## 2021-11-26 NOTE — Plan of Care (Signed)
  Problem: Skin Integrity: Goal: Risk for impaired skin integrity will decrease Outcome: Progressing   Problem: Activity: Goal: Risk for activity intolerance will decrease Outcome: Progressing   Problem: Elimination: Goal: Will not experience complications related to urinary retention Outcome: Progressing

## 2021-11-26 NOTE — Care Management Important Message (Signed)
Important Message  Patient Details  Name: FARRIS GEIMAN MRN: [id number] Date of Birth: March 23, 1940   Medicare Important Message Given:  Yes     Dannette Barbara 11/26/2021, 11:57 AM

## 2021-11-26 NOTE — Progress Notes (Signed)
Progress Note    Willie [name]  FYB:[id number] DOB: [birth year]  DOA: 11/21/2021 PCP: [name], MD      Brief Narrative:    Medical records reviewed and are as summarized below:  Willie [name] is a 81 y.o. male  with medical history significant for DM, HTN, CAD, rectal cancer s/p colectomy, chemotherapy and XRT, with colostomy bag, prior admissions for small bowel obstruction most recently from 7/16 to 09/27/2021.  He presented to the hospital with 24-hour history of abdominal pain, abdominal distention, vomiting, lack of output in the colostomy bag.  He had gone for routine surveillance CT chest, abdomen and pelvis with contrast prior to admission and he believes this triggered his symptoms.    He was admitted to the hospital for partial small bowel obstruction that was evident on repeat CT abdomen and pelvis.    Assessment/Plan:   Principal Problem:   Partial small bowel obstruction (HCC) Active Problems:   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HCC)   Hypertensive urgency   AKI (acute kidney injury) ([place])   History of NSVT (nonsustained ventricular tachycardia) (HCC)   CAD (coronary artery disease)   Controlled type 2 diabetes mellitus without complication, with long-term current use of insulin (HCC)   Colostomy status (HCC)    Body mass index is 29.95 kg/m.  Small bowel obstruction, history of recurrent SBO with recent hospitalization from 09/23/2021 through 09/27/2021 for SBO: S/p robotic assisted laparoscopic extensive enterolysis on 11/23/2021.  Patient is still on a soft diet.  Surgeon unable to advance diet because of significant nausea.  IV Phenergan has been added to Zofran for nausea.  Continue analgesics as needed for pain.  AKI with non-anion gap metabolic acidosis: Patient has had fluctuating GFR levels in the past.  Suspect he may have underlying CKD stage IIIa.  Monitor BMP.  Hypertensive urgency: BP is better. Continue lisinopril and metoprolol.  Type II DM: Use  NovoLog as needed for hyperglycemia  History of NSVT: Continue metoprolol  History of rectal cancer s/p colectomy, chemotherapy and radiation therapy, colostomy status: Outpatient follow-up with oncologist    Diet Order             DIET SOFT Room service appropriate? Yes; Fluid consistency: Thin  Diet effective now                            Consultants: General surgeon  Procedures: Robotic assisted laparoscopic extensive enterolysis 11/23/2021    Medications:    bismuth subsalicylate  527 mg Oral TID AC & HS   brimonidine  1 drop Both Eyes BID   enoxaparin (LOVENOX) injection  40 mg Subcutaneous Q24H   insulin aspart  0-15 Units Subcutaneous Q4H   lisinopril  10 mg Oral Daily   metoprolol succinate  50 mg Oral Daily   Netarsudil-Latanoprost  1 drop Both Eyes QHS   pantoprazole  40 mg Oral QHS   rosuvastatin  10 mg Oral Daily   Continuous Infusions:  sodium chloride 50 mL/hr at 11/26/21 1055   promethazine (PHENERGAN) injection (IM or IVPB) Stopped (11/26/21 1030)      Anti-infectives (From admission, onward)    Start     Dose/Rate Route Frequency Ordered Stop   11/23/21 1130  ceFAZolin (ANCEF) 2-4 GM/100ML-% IVPB       Note to Pharmacy: Norton Blizzard A: cabinet override      11/23/21 1130 11/23/21 1145  Family Communication/Anticipated D/C date and plan/Code Status   DVT prophylaxis: enoxaparin (LOVENOX) injection 40 mg Start: 11/21/21 2145     Code Status: DNR  Family Communication: Plan discussed with his nephew at the bedside Disposition Plan: Plan to discharge home in tomorrow   Status is: Inpatient Remains inpatient appropriate because: S/p surgery for SBO     Subjective:   He complains of soreness at the surgical sites.  He also complains of significant nausea.  Objective:    Vitals:   11/25/21 1541 11/25/21 1952 11/26/21 0421 11/26/21 0852  BP: (!) 152/52 (!) 162/57 136/61 (!) 145/77  Pulse: 64  80 (!) 58 63  Resp: '18 20 18 16  '$ Temp: 98.4 F (36.9 C) 98.8 F (37.1 C) 98 F (36.7 C) 98.5 F (36.9 C)  TempSrc: Oral Oral Oral Oral  SpO2: 96% 100% 97% 99%  Weight:      Height:       No data found.   Intake/Output Summary (Last 24 hours) at 11/26/2021 1334 Last data filed at 11/26/2021 1220 Gross per 24 hour  Intake 602.85 ml  Output 450 ml  Net 152.85 ml   Filed Weights   11/21/21 2100 11/22/21 1100 11/23/21 1119  Weight: 76.2 kg 76.7 kg 76.7 kg    Exam:  GEN: NAD SKIN: No rash EYES: EOMI ENT: MMM CV: RRR PULM: CTA B ABD: soft, ND, NT, +BS, +colostomy bag with yellowish stools CNS: AAO x 3, non focal EXT: No edema or tenderness              Data Reviewed:   I have personally reviewed following labs and imaging studies:  Labs: Labs show the following:   Basic Metabolic Panel: Recent Labs  Lab 11/21/21 0939 11/21/21 1711 11/21/21 1824 11/22/21 0409 11/23/21 0409 11/25/21 0549  NA 136  --  136 138 138 140  K 4.3  --  4.2 4.3 4.2 3.6  CL 105  --  106 108 102 106  CO2 26  --  19* 22 21* 26  GLUCOSE 166*  --  171* 160* 159* 107*  BUN 28*  --  '23 22 16 21  '$ CREATININE 1.56*  --  1.34* 1.29* 1.22 1.47*  CALCIUM 8.6*  --  8.6* 8.4* 8.6* 8.0*  MG  --  2.1  --  2.1  --  2.3   GFR Estimated Creatinine Clearance: 34.9 mL/min (A) (by C-G formula based on SCr of 1.47 mg/dL (H)). Liver Function Tests: Recent Labs  Lab 11/21/21 0939 11/21/21 1824  AST 18 22  ALT 17 16  ALKPHOS 71 72  BILITOT 0.5 0.5  PROT 7.6 7.5  ALBUMIN 3.7 3.8   No results for input(s): "LIPASE", "AMYLASE" in the last 168 hours. No results for input(s): "AMMONIA" in the last 168 hours. Coagulation profile No results for input(s): "INR", "PROTIME" in the last 168 hours.  CBC: Recent Labs  Lab 11/21/21 0939 11/21/21 1546 11/22/21 0409 11/25/21 0549  WBC 3.8* 6.7 4.5 4.3  NEUTROABS 2.3 5.7  --  2.6  HGB 13.7 15.4 14.1 12.0*  HCT 41.5 45.9 42.3 37.2*  MCV 90.0  87.8 86.5 90.7  PLT 207 235 235 191   Cardiac Enzymes: No results for input(s): "CKTOTAL", "CKMB", "CKMBINDEX", "TROPONINI" in the last 168 hours. BNP (last 3 results) No results for input(s): "PROBNP" in the last 8760 hours. CBG: Recent Labs  Lab 11/25/21 1955 11/26/21 0034 11/26/21 0507 11/26/21 0745 11/26/21 1246  GLUCAP 177* 154* 119* 116*  175*   D-Dimer: No results for input(s): "DDIMER" in the last 72 hours. Hgb A1c: No results for input(s): "HGBA1C" in the last 72 hours.  Lipid Profile: No results for input(s): "CHOL", "HDL", "LDLCALC", "TRIG", "CHOLHDL", "LDLDIRECT" in the last 72 hours. Thyroid function studies: No results for input(s): "TSH", "T4TOTAL", "T3FREE", "THYROIDAB" in the last 72 hours.  Invalid input(s): "FREET3" Anemia work up: No results for input(s): "VITAMINB12", "FOLATE", "FERRITIN", "TIBC", "IRON", "RETICCTPCT" in the last 72 hours. Sepsis Labs: Recent Labs  Lab 11/21/21 0939 11/21/21 1546 11/22/21 0409 11/25/21 0549  WBC 3.8* 6.7 4.5 4.3    Microbiology No results found for this or any previous visit (from the past 240 hour(s)).  Procedures and diagnostic studies:  No results found.             LOS: 5 days   Addysin Porco  [facility]   Pager on [email or web address]. If 7PM-7AM, please contact night-coverage at [email or web address]     11/26/2021, 1:34 PM

## 2021-11-27 ENCOUNTER — Inpatient Hospital Stay: Payer: Medicare PPO

## 2021-11-27 DIAGNOSIS — Z794 Long term (current) use of insulin: Secondary | ICD-10-CM | POA: Diagnosis not present

## 2021-11-27 DIAGNOSIS — N179 Acute kidney failure, unspecified: Secondary | ICD-10-CM | POA: Diagnosis not present

## 2021-11-27 DIAGNOSIS — E119 Type 2 diabetes mellitus without complications: Secondary | ICD-10-CM | POA: Diagnosis not present

## 2021-11-27 DIAGNOSIS — K56600 Partial intestinal obstruction, unspecified as to cause: Secondary | ICD-10-CM | POA: Diagnosis not present

## 2021-11-27 LAB — BASIC METABOLIC PANEL
Anion gap: 7 (ref 5–15)
BUN: 16 mg/dL (ref 8–23)
CO2: 27 mmol/L (ref 22–32)
Calcium: 7.8 mg/dL — ABNORMAL LOW (ref 8.9–10.3)
Chloride: 104 mmol/L (ref 98–111)
Creatinine, Ser: 1.4 mg/dL — ABNORMAL HIGH (ref 0.61–1.24)
GFR, Estimated: 50 mL/min — ABNORMAL LOW (ref >60)
Glucose, Bld: 120 mg/dL — ABNORMAL HIGH (ref 70–99)
Potassium: 3.6 mmol/L (ref 3.5–5.1)
Sodium: 138 mmol/L (ref 135–145)

## 2021-11-27 LAB — GLUCOSE, CAPILLARY
Glucose-Capillary: 111 mg/dL — ABNORMAL HIGH (ref 70–99)
Glucose-Capillary: 115 mg/dL — ABNORMAL HIGH (ref 70–99)
Glucose-Capillary: 145 mg/dL — ABNORMAL HIGH (ref 70–99)
Glucose-Capillary: 136 mg/dL — ABNORMAL HIGH (ref 70–99)
Glucose-Capillary: 178 mg/dL — ABNORMAL HIGH (ref 70–99)

## 2021-11-27 MED ORDER — IOHEXOL 300 MG/ML  SOLN
100.0000 mL | Freq: Once | INTRAMUSCULAR | Status: AC | PRN
  Administered 2021-11-27: 100 mL via INTRAVENOUS

## 2021-11-27 MED ORDER — IOHEXOL 9 MG/ML PO SOLN
500.0000 mL | ORAL | Status: AC
  Administered 2021-11-27 (×2): 500 mL via ORAL

## 2021-11-27 NOTE — Progress Notes (Signed)
Patient ID: Willie [name], male   DOB: 1940-09-06, 81 y.o.   MRN: [id number]     [place] Hospital Day(s): 6.   Interval History: Patient seen and examined, no acute events or new complaints overnight. Patient reports did not have a good night and was not able to eat much.  He endorses that he is having soreness and pain in the lower abdomen.  Denies nausea or vomiting.  Vital signs in last 24 hours: [min-max] current  Temp:  [98.3 F (36.8 C)-98.5 F (36.9 C)] 98.3 F (36.8 C) (09/19 0421) Pulse Rate:  [63-75] 75 (09/19 0421) Resp:  [15-20] 15 (09/19 0421) BP: (143-162)/(66-77) 162/77 (09/19 0421) SpO2:  [96 %-99 %] 97 % (09/19 0421)     Height: '5\' 3"'$  (160 cm) Weight: 76.7 kg BMI (Calculated): 29.96   Physical Exam:  Constitutional: alert, cooperative and no distress  Respiratory: breathing non-labored at rest  Cardiovascular: regular rate and sinus rhythm  Gastrointestinal: soft, non-tender, and non-distended.  Colostomy is pink and patent  Labs:     Latest Ref Rng & Units 11/25/2021    5:49 AM 11/22/2021    4:09 AM 11/21/2021    3:46 PM  CBC  WBC 4.0 - 10.5 K/uL 4.3  4.5  6.7   Hemoglobin 13.0 - 17.0 g/dL 12.0  14.1  15.4   Hematocrit 39.0 - 52.0 % 37.2  42.3  45.9   Platelets 150 - 400 K/uL 191  235  235       Latest Ref Rng & Units 11/27/2021    4:53 AM 11/25/2021    5:49 AM 11/23/2021    4:09 AM  CMP  Glucose 70 - 99 mg/dL 120  107  159   BUN 8 - 23 mg/dL '16  21  16   '$ Creatinine 0.61 - 1.24 mg/dL 1.40  1.47  1.22   Sodium 135 - 145 mmol/L 138  140  138   Potassium 3.5 - 5.1 mmol/L 3.6  3.6  4.2   Chloride 98 - 111 mmol/L 104  106  102   CO2 22 - 32 mmol/L '27  26  21   '$ Calcium 8.9 - 10.3 mg/dL 7.8  8.0  8.6     Imaging studies: No new pertinent imaging studies   Assessment/Plan:  81 y.o. male with small bowel obstruction 4 Day Post-Op s/p robotic assisted laparoscopic enterolysis.  Stable vital signs.  No fever. -Continuing abdominal pain.   Patient still continue passing gas through the colostomy.  Due to persistent abdominal pain and unable to tolerate solid diet I will order CT scan of the abdomen and pelvis to rule out postop complications. Possible causes of persistent pain could be postop ileus even though he is passing gas he may be still too slow and crampy.  If no sign of any complication from CT scan, we will continue with pain management.  I encouraged the patient to ambulate.  Arnold Long, MD

## 2021-11-27 NOTE — Progress Notes (Signed)
Mobility Specialist - Progress Note   11/27/21 1300  Mobility  Activity Ambulated independently in hallway;Stood at bedside;Dangled on edge of bed  Level of Assistance Independent after set-up  Assistive Device Front wheel walker  Distance Ambulated (ft) 800 ft  Activity Response Tolerated well  $Mobility charge 1 Mobility   Pt supine in bed on RA upon arrival. Pt STS with HHA and ambulates 5 laps indep. Pt returns to bed with needs in reach and family in room.   Gretchen Short  Mobility Specialist  11/27/21 1:55 PM

## 2021-11-27 NOTE — Progress Notes (Signed)
Progress Note    Willie [name]  ZOX:[id number] DOB: Jul 15, 1940  DOA: 11/21/2021 PCP: [name], MD      Brief Narrative:    Medical records reviewed and are as summarized below:  Willie [name] is a 81 y.o. male  with medical history significant for DM, HTN, CAD, rectal cancer s/p colectomy, chemotherapy and XRT, with colostomy bag, prior admissions for small bowel obstruction most recently from 7/16 to 09/27/2021.  He presented to the hospital with 24-hour history of abdominal pain, abdominal distention, vomiting, lack of output in the colostomy bag.  He had gone for routine surveillance CT chest, abdomen and pelvis with contrast prior to admission and he believes this triggered his symptoms.    He was admitted to the hospital for partial small bowel obstruction that was evident on repeat CT abdomen and pelvis.    Assessment/Plan:   Principal Problem:   Partial small bowel obstruction (HCC) Active Problems:   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HCC)   Hypertensive urgency   AKI (acute kidney injury) ([place])   History of NSVT (nonsustained ventricular tachycardia) (HCC)   CAD (coronary artery disease)   Controlled type 2 diabetes mellitus without complication, with long-term current use of insulin (HCC)   Colostomy status (HCC)    Body mass index is 29.95 kg/m.  Small bowel obstruction, history of recurrent SBO with recent hospitalization from 09/23/2021 through 09/27/2021 for SBO: S/p robotic assisted laparoscopic extensive enterolysis on 11/23/2021.  He remains on a soft diet.  Continue analgesics as needed for pain and antiemetics as needed for nausea.  Encouraged frequent ambulation.  Follow-up with general surgeon for further recommendations.  He had repeat CT abdomen pelvis today because of recurrent abdominal pain and nausea.  Results reviewed.  Report is as follows 1  IMPRESSION: 1. Since the CT of 11/21/2021, improved appearance of probable low-grade partial small bowel  obstruction, presumably secondary to adhesions in the distal ileum. No ischemia or other acute complication. 2. Status post partial colectomy and transverse colostomy. New 2.8 cm soft tissue density adjacent the distal most colon. Appearance is nonspecific, especially given interval development over 5 days. Possibly postoperative such as small hematoma. Peritoneal metastasis could have this appearance but felt unlikely given time course of development. Recommend attention on follow-up. 3. Resolved abdominopelvic ascites.      AKI with non-anion gap metabolic acidosis: Patient has had fluctuating GFR levels in the past.  Suspect he may have underlying CKD stage IIIa.  Monitor BMP.  Hypertensive urgency: BP is better. Continue lisinopril and metoprolol.  Type II DM: Use NovoLog as needed for hyperglycemia  History of NSVT: Continue metoprolol  History of rectal cancer s/p colectomy, chemotherapy and radiation therapy, colostomy status: Outpatient follow-up with oncologist    Diet Order             DIET SOFT Room service appropriate? Yes; Fluid consistency: Thin  Diet effective now                            Consultants: General surgeon  Procedures: Robotic assisted laparoscopic extensive enterolysis 11/23/2021    Medications:    bismuth subsalicylate  811 mg Oral TID AC & HS   brimonidine  1 drop Both Eyes BID   enoxaparin (LOVENOX) injection  40 mg Subcutaneous Q24H   insulin aspart  0-15 Units Subcutaneous Q4H   lisinopril  10 mg Oral Daily   metoprolol succinate  50  mg Oral Daily   Netarsudil-Latanoprost  1 drop Both Eyes QHS   pantoprazole  40 mg Oral QHS   rosuvastatin  10 mg Oral Daily   Continuous Infusions:  sodium chloride 10 mL/hr at 11/27/21 0317   promethazine (PHENERGAN) injection (IM or IVPB) Stopped (11/26/21 1030)      Anti-infectives (From admission, onward)    Start     Dose/Rate Route Frequency Ordered Stop   11/23/21 1130   ceFAZolin (ANCEF) 2-4 GM/100ML-% IVPB       Note to Pharmacy: Norton Blizzard A: cabinet override      11/23/21 1130 11/23/21 1145              Family Communication/Anticipated D/C date and plan/Code Status   DVT prophylaxis: enoxaparin (LOVENOX) injection 40 mg Start: 11/21/21 2145     Code Status: DNR  Family Communication: Plan discussed with his nephew at the bedside Disposition Plan: Plan to discharge home in tomorrow   Status is: Inpatient Remains inpatient appropriate because: S/p surgery for SBO     Subjective:   He complains of abdominal pain.  Nausea has improved.  [place], nephew, was at the bedside  Objective:    Vitals:   11/26/21 1548 11/26/21 1937 11/27/21 0421 11/27/21 0819  BP: (!) 143/69 (!) 151/66 (!) 162/77 (!) 166/72  Pulse: 74 64 75 64  Resp: '18 20 15 19  '$ Temp: 98.4 F (36.9 C) 98.5 F (36.9 C) 98.3 F (36.8 C) 98.1 F (36.7 C)  TempSrc: Oral Oral Oral Oral  SpO2: 98% 96% 97% 100%  Weight:      Height:       No data found.   Intake/Output Summary (Last 24 hours) at 11/27/2021 1451 Last data filed at 11/27/2021 1208 Gross per 24 hour  Intake 338.33 ml  Output 1115 ml  Net -776.67 ml   Filed Weights   11/21/21 2100 11/22/21 1100 11/23/21 1119  Weight: 76.2 kg 76.7 kg 76.7 kg    Exam:  GEN: NAD SKIN: No rash EYES: EOMI ENT: MMM CV: RRR PULM: CTA B ABD: soft, ND, NT, +BS, + colostomy bag with yellowish stools CNS: AAO x 3, non focal EXT: No edema or tenderness       Data Reviewed:   I have personally reviewed following labs and imaging studies:  Labs: Labs show the following:   Basic Metabolic Panel: Recent Labs  Lab 11/21/21 1711 11/21/21 1824 11/22/21 0409 11/23/21 0409 11/25/21 0549 11/27/21 0453  NA  --  136 138 138 140 138  K  --  4.2 4.3 4.2 3.6 3.6  CL  --  106 108 102 106 104  CO2  --  19* 22 21* 26 27  GLUCOSE  --  171* 160* 159* 107* 120*  BUN  --  '23 22 16 21 16  '$ CREATININE  --   1.34* 1.29* 1.22 1.47* 1.40*  CALCIUM  --  8.6* 8.4* 8.6* 8.0* 7.8*  MG 2.1  --  2.1  --  2.3  --    GFR Estimated Creatinine Clearance: 36.6 mL/min (A) (by C-G formula based on SCr of 1.4 mg/dL (H)). Liver Function Tests: Recent Labs  Lab 11/21/21 0939 11/21/21 1824  AST 18 22  ALT 17 16  ALKPHOS 71 72  BILITOT 0.5 0.5  PROT 7.6 7.5  ALBUMIN 3.7 3.8   No results for input(s): "LIPASE", "AMYLASE" in the last 168 hours. No results for input(s): "AMMONIA" in the last 168 hours. Coagulation profile No  results for input(s): "INR", "PROTIME" in the last 168 hours.  CBC: Recent Labs  Lab 11/21/21 0939 11/21/21 1546 11/22/21 0409 11/25/21 0549  WBC 3.8* 6.7 4.5 4.3  NEUTROABS 2.3 5.7  --  2.6  HGB 13.7 15.4 14.1 12.0*  HCT 41.5 45.9 42.3 37.2*  MCV 90.0 87.8 86.5 90.7  PLT 207 235 235 191   Cardiac Enzymes: No results for input(s): "CKTOTAL", "CKMB", "CKMBINDEX", "TROPONINI" in the last 168 hours. BNP (last 3 results) No results for input(s): "PROBNP" in the last 8760 hours. CBG: Recent Labs  Lab 11/26/21 1941 11/26/21 2324 11/27/21 0421 11/27/21 0829 11/27/21 1156  GLUCAP 117* 149* 111* 115* 145*   D-Dimer: No results for input(s): "DDIMER" in the last 72 hours. Hgb A1c: No results for input(s): "HGBA1C" in the last 72 hours.  Lipid Profile: No results for input(s): "CHOL", "HDL", "LDLCALC", "TRIG", "CHOLHDL", "LDLDIRECT" in the last 72 hours. Thyroid function studies: No results for input(s): "TSH", "T4TOTAL", "T3FREE", "THYROIDAB" in the last 72 hours.  Invalid input(s): "FREET3" Anemia work up: No results for input(s): "VITAMINB12", "FOLATE", "FERRITIN", "TIBC", "IRON", "RETICCTPCT" in the last 72 hours. Sepsis Labs: Recent Labs  Lab 11/21/21 0939 11/21/21 1546 11/22/21 0409 11/25/21 0549  WBC 3.8* 6.7 4.5 4.3    Microbiology No results found for this or any previous visit (from the past 240 hour(s)).  Procedures and diagnostic studies:  CT  ABDOMEN PELVIS W CONTRAST  Result Date: 11/27/2021 CLINICAL DATA:  Rectal cancer, status post colectomy. Chemotherapy and radiation therapy. Abdominal pain/distension. Vomiting. Robot assisted diagnostic laparoscopy 4 days ago. * Tracking Code: BO * EXAM: CT ABDOMEN AND PELVIS WITH CONTRAST TECHNIQUE: Multidetector CT imaging of the abdomen and pelvis was performed using the standard protocol following bolus administration of intravenous contrast. RADIATION DOSE REDUCTION: This exam was performed according to the departmental dose-optimization program which includes automated exposure control, adjustment of the mA and/or kV according to patient size and/or use of iterative reconstruction technique. CONTRAST:  127m OMNIPAQUE IOHEXOL 300 MG/ML  SOLN COMPARISON:  11/21/2021 FINDINGS: Lower chest: Clear lung bases. Normal heart size without pericardial or pleural effusion. Hepatobiliary: Normal liver. Normal gallbladder, without biliary ductal dilatation. Pancreas: Normal, without mass or ductal dilatation. Spleen: Minimal subcapsular splenic hypoattenuation on 22/2 is similar back to 09/23/2021, possibly small volume infarct. Adrenals/Urinary Tract: Mild left adrenal thickening. Normal right adrenal gland. Interpolar right renal 1.9 cm cyst . In the absence of clinically indicated signs/symptoms require(s) no independent follow-up. Normal left kidney. No hydronephrosis. Normal urinary bladder. Stomach/Bowel: Normal stomach, without wall thickening. left abdominal transverse colostomy. Extensive diverticulosis throughout the remaining colon. Soft tissue density just left of the distal most colon (just proximal to the ostomy site) measures 2.8 x 2.7 cm on 48/2 and is new since 11/21/2021. Normal terminal ileum and appendix. Fluid-filled small bowel loops measure maximally 3.0 cm including on 52/2. Decreased from 3.3 cm on 11/21/2021. The distal ileum is relatively decompressed. Gradual transition in the right lower  quadrant from dilated small bowel on 33/2 to decompressed small bowel on 53/2. No findings to suggest complicating ischemia. Vascular/Lymphatic: Advanced aortic and branch vessel atherosclerosis. No abdominopelvic adenopathy. Reproductive: Prostatectomy. Other: Right inguinal hernia contains fluid and is small. Resolved abdominopelvic fluid with mild interstitial thickening remaining in the deep pelvis. No free intraperitoneal air. Musculoskeletal: Presumably iatrogenic air about the anterior right pelvic wall. Degenerative changes of both hips. IMPRESSION: 1. Since the CT of 11/21/2021, improved appearance of probable low-grade partial small bowel obstruction, presumably  secondary to adhesions in the distal ileum. No ischemia or other acute complication. 2. Status post partial colectomy and transverse colostomy. New 2.8 cm soft tissue density adjacent the distal most colon. Appearance is nonspecific, especially given interval development over 5 days. Possibly postoperative such as small hematoma. Peritoneal metastasis could have this appearance but felt unlikely given time course of development. Recommend attention on follow-up. 3. Resolved abdominopelvic ascites. Electronically Signed   By: Abigail Miyamoto M.D.   On: 11/27/2021 13:11               LOS: 6 days   Donovin Kraemer  [facility]   Pager on [email or web address]. If 7PM-7AM, please contact night-coverage at [email or web address]     11/27/2021, 2:51 PM

## 2021-11-28 DIAGNOSIS — N179 Acute kidney failure, unspecified: Secondary | ICD-10-CM | POA: Diagnosis not present

## 2021-11-28 DIAGNOSIS — K56600 Partial intestinal obstruction, unspecified as to cause: Secondary | ICD-10-CM | POA: Diagnosis not present

## 2021-11-28 DIAGNOSIS — I10 Essential (primary) hypertension: Secondary | ICD-10-CM | POA: Diagnosis not present

## 2021-11-28 LAB — GLUCOSE, CAPILLARY
Glucose-Capillary: 111 mg/dL — ABNORMAL HIGH (ref 70–99)
Glucose-Capillary: 112 mg/dL — ABNORMAL HIGH (ref 70–99)
Glucose-Capillary: 116 mg/dL — ABNORMAL HIGH (ref 70–99)
Glucose-Capillary: 121 mg/dL — ABNORMAL HIGH (ref 70–99)
Glucose-Capillary: 141 mg/dL — ABNORMAL HIGH (ref 70–99)
Glucose-Capillary: 141 mg/dL — ABNORMAL HIGH (ref 70–99)
Glucose-Capillary: 155 mg/dL — ABNORMAL HIGH (ref 70–99)

## 2021-11-28 LAB — CREATININE, SERUM
Creatinine, Ser: 1.28 mg/dL — ABNORMAL HIGH (ref 0.61–1.24)
GFR, Estimated: 56 mL/min — ABNORMAL LOW (ref >60)

## 2021-11-28 NOTE — Progress Notes (Signed)
Mobility Specialist - Progress Note   11/28/21 1049  Mobility  Activity Ambulated independently in hallway;Stood at bedside;Dangled on edge of bed  Level of Assistance Independent after set-up  Assistive Device Front wheel walker  Distance Ambulated (ft) 1000 ft  Activity Response Tolerated well  $Mobility charge 1 Mobility   Pt supine in bed on RA upon arrival. Pt STS with HHA and ambulates over 1,000 ft indep. Pt returns to bed with needs in reach and family in room.   Gretchen Short  Mobility Specialist  11/28/21 10:50 AM

## 2021-11-28 NOTE — Progress Notes (Signed)
PROGRESS NOTE    Willie [name]  EGB:[id number] DOB: Apr 12, 1940 DOA: 11/21/2021 PCP: [name], MD    Assessment & Plan:   Principal Problem:   Partial small bowel obstruction ([place]) Active Problems:   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[place])   Hypertensive urgency   AKI (acute kidney injury) ([place])   History of NSVT (nonsustained ventricular tachycardia) ([place])   CAD (coronary artery disease)   Controlled type 2 diabetes mellitus without complication, with long-term current use of insulin ([place])   Colostomy status ([place])  Assessment and Plan: SBO: w/ hx of recurrent SBO with recent hospitalization from 7/16- 09/27/2021 for SBO. S/p robotic assisted laparoscopic extensive enterolysis on 11/23/2021. C/o abd cramps intermittently. Oxycodone prn   AKI: Cr is trending down from day prior. Avoid nephrotoxic meds  Non-anion gap metabolic acidosis: resolved   Hypertensive urgency:  Urgency resolved but still w/ HTN. Continue lisinopril and metoprolol.   DM2: likely well controlled. Continue on SSI w/ accuchecks  Hx of NSVT: continue metoprolol    DVT prophylaxis:  lovenox  Code Status: DNR Family Communication: discussed pt's care w/ pt's family at bedside and answered their questions  Disposition Plan: likely d/c back home tomorrow  Level of care: Med-Surg  Status is: Inpatient Remains inpatient appropriate because: can likely d/c back home tomorrow      Consultants:  Gen surg   Procedures:   Antimicrobials:  Subjective: Pt c/o abd cramps  Objective: Vitals:   11/27/21 1637 11/27/21 1912 11/28/21 0414 11/28/21 0811  BP: 138/77 (!) 130/40 (!) 147/56 (!) 152/54  Pulse: 72 (!) 56 72 80  Resp:  '16 14 16  '$ Temp: 98.2 F (36.8 C) 99 F (37.2 C) 98.4 F (36.9 C) 98.1 F (36.7 C)  TempSrc: Oral Oral Oral Oral  SpO2: 100% 97% 99% 95%  Weight:      Height:        Intake/Output Summary (Last 24 hours) at 11/28/2021 1406 Last data filed at 11/28/2021 1026 Gross per 24 hour   Intake 240 ml  Output 301 ml  Net -61 ml   Filed Weights   11/21/21 2100 11/22/21 1100 11/23/21 1119  Weight: 76.2 kg 76.7 kg 76.7 kg    Examination:  General exam: Appears calm but uncomfortable  Respiratory system: Clear to auscultation. Respiratory effort normal. Cardiovascular system: S1 & S2 +. No rubs, gallops or clicks. Gastrointestinal system: Abdomen is nondistended, soft and nontender. Normal bowel sounds heard. Central nervous system: Alert and oriented. Moves all extremities  Psychiatry: Judgement and insight appear normal. Flat mood and affect    Data Reviewed: I have personally reviewed following labs and imaging studies  CBC: Recent Labs  Lab 11/21/21 1546 11/22/21 0409 11/25/21 0549  WBC 6.7 4.5 4.3  NEUTROABS 5.7  --  2.6  HGB 15.4 14.1 12.0*  HCT 45.9 42.3 37.2*  MCV 87.8 86.5 90.7  PLT 235 235 371   Basic Metabolic Panel: Recent Labs  Lab 11/21/21 1711 11/21/21 1824 11/21/21 1824 11/22/21 0409 11/23/21 0409 11/25/21 0549 11/27/21 0453 11/28/21 0451  NA  --  136  --  138 138 140 138  --   K  --  4.2  --  4.3 4.2 3.6 3.6  --   CL  --  106  --  108 102 106 104  --   CO2  --  19*  --  22 21* 26 27  --   GLUCOSE  --  171*  --  160* 159* 107* 120*  --  BUN  --  23  --  '22 16 21 16  '$ --   CREATININE  --  1.34*   < > 1.29* 1.22 1.47* 1.40* 1.28*  CALCIUM  --  8.6*  --  8.4* 8.6* 8.0* 7.8*  --   MG 2.1  --   --  2.1  --  2.3  --   --    < > = values in this interval not displayed.   GFR: Estimated Creatinine Clearance: 40.1 mL/min (A) (by C-G formula based on SCr of 1.28 mg/dL (H)). Liver Function Tests: Recent Labs  Lab 11/21/21 1824  AST 22  ALT 16  ALKPHOS 72  BILITOT 0.5  PROT 7.5  ALBUMIN 3.8   No results for input(s): "LIPASE", "AMYLASE" in the last 168 hours. No results for input(s): "AMMONIA" in the last 168 hours. Coagulation Profile: No results for input(s): "INR", "PROTIME" in the last 168 hours. Cardiac Enzymes: No  results for input(s): "CKTOTAL", "CKMB", "CKMBINDEX", "TROPONINI" in the last 168 hours. BNP (last 3 results) No results for input(s): "PROBNP" in the last 8760 hours. HbA1C: No results for input(s): "HGBA1C" in the last 72 hours. CBG: Recent Labs  Lab 11/28/21 0009 11/28/21 0412 11/28/21 0815 11/28/21 1132 11/28/21 1222  GLUCAP 121* 116* 141* 111* 112*   Lipid Profile: No results for input(s): "CHOL", "HDL", "LDLCALC", "TRIG", "CHOLHDL", "LDLDIRECT" in the last 72 hours. Thyroid Function Tests: No results for input(s): "TSH", "T4TOTAL", "FREET4", "T3FREE", "THYROIDAB" in the last 72 hours. Anemia Panel: No results for input(s): "VITAMINB12", "FOLATE", "FERRITIN", "TIBC", "IRON", "RETICCTPCT" in the last 72 hours. Sepsis Labs: No results for input(s): "PROCALCITON", "LATICACIDVEN" in the last 168 hours.  No results found for this or any previous visit (from the past 240 hour(s)).       Radiology Studies: CT ABDOMEN PELVIS W CONTRAST  Result Date: 11/27/2021 CLINICAL DATA:  Rectal cancer, status post colectomy. Chemotherapy and radiation therapy. Abdominal pain/distension. Vomiting. Robot assisted diagnostic laparoscopy 4 days ago. * Tracking Code: BO * EXAM: CT ABDOMEN AND PELVIS WITH CONTRAST TECHNIQUE: Multidetector CT imaging of the abdomen and pelvis was performed using the standard protocol following bolus administration of intravenous contrast. RADIATION DOSE REDUCTION: This exam was performed according to the departmental dose-optimization program which includes automated exposure control, adjustment of the mA and/or kV according to patient size and/or use of iterative reconstruction technique. CONTRAST:  135m OMNIPAQUE IOHEXOL 300 MG/ML  SOLN COMPARISON:  11/21/2021 FINDINGS: Lower chest: Clear lung bases. Normal heart size without pericardial or pleural effusion. Hepatobiliary: Normal liver. Normal gallbladder, without biliary ductal dilatation. Pancreas: Normal, without  mass or ductal dilatation. Spleen: Minimal subcapsular splenic hypoattenuation on 22/2 is similar back to 09/23/2021, possibly small volume infarct. Adrenals/Urinary Tract: Mild left adrenal thickening. Normal right adrenal gland. Interpolar right renal 1.9 cm cyst . In the absence of clinically indicated signs/symptoms require(s) no independent follow-up. Normal left kidney. No hydronephrosis. Normal urinary bladder. Stomach/Bowel: Normal stomach, without wall thickening. left abdominal transverse colostomy. Extensive diverticulosis throughout the remaining colon. Soft tissue density just left of the distal most colon (just proximal to the ostomy site) measures 2.8 x 2.7 cm on 48/2 and is new since 11/21/2021. Normal terminal ileum and appendix. Fluid-filled small bowel loops measure maximally 3.0 cm including on 52/2. Decreased from 3.3 cm on 11/21/2021. The distal ileum is relatively decompressed. Gradual transition in the right lower quadrant from dilated small bowel on 33/2 to decompressed small bowel on 53/2. No findings to suggest complicating  ischemia. Vascular/Lymphatic: Advanced aortic and branch vessel atherosclerosis. No abdominopelvic adenopathy. Reproductive: Prostatectomy. Other: Right inguinal hernia contains fluid and is small. Resolved abdominopelvic fluid with mild interstitial thickening remaining in the deep pelvis. No free intraperitoneal air. Musculoskeletal: Presumably iatrogenic air about the anterior right pelvic wall. Degenerative changes of both hips. IMPRESSION: 1. Since the CT of 11/21/2021, improved appearance of probable low-grade partial small bowel obstruction, presumably secondary to adhesions in the distal ileum. No ischemia or other acute complication. 2. Status post partial colectomy and transverse colostomy. New 2.8 cm soft tissue density adjacent the distal most colon. Appearance is nonspecific, especially given interval development over 5 days. Possibly postoperative such as  small hematoma. Peritoneal metastasis could have this appearance but felt unlikely given time course of development. Recommend attention on follow-up. 3. Resolved abdominopelvic ascites. Electronically Signed   By: Abigail Miyamoto M.D.   On: 11/27/2021 13:11        Scheduled Meds:  bismuth subsalicylate  758 mg Oral TID AC & HS   brimonidine  1 drop Both Eyes BID   enoxaparin (LOVENOX) injection  40 mg Subcutaneous Q24H   insulin aspart  0-15 Units Subcutaneous Q4H   lisinopril  10 mg Oral Daily   metoprolol succinate  50 mg Oral Daily   Netarsudil-Latanoprost  1 drop Both Eyes QHS   pantoprazole  40 mg Oral QHS   rosuvastatin  10 mg Oral Daily   Continuous Infusions:  sodium chloride 10 mL/hr at 11/27/21 0317   promethazine (PHENERGAN) injection (IM or IVPB) Stopped (11/26/21 1030)     LOS: 7 days    Time spent: 25 mins     Wyvonnia Dusky, MD [facility] Pager [phone]  If 7PM-7AM, please contact night-coverage [email or web address] 11/28/2021, 2:06 PM

## 2021-11-28 NOTE — Progress Notes (Signed)
Patient ID: Willie [name], male   DOB: September 05, 1940, 81 y.o.   MRN: [id number]     [place] Hospital Day(s): 7.   Interval History: Patient seen and examined, no acute events or new complaints overnight. Patient reports continued having intermittent cramps.  He did have good bowel movement through the colostomy.  Patient was seen last night eating the meat loaf and much better.  This morning he woke up with more cramping.  Denies any nausea or vomiting.  Having adequate colostomy output.  Vital signs in last 24 hours: [min-max] current  Temp:  [98.1 F (36.7 C)-99 F (37.2 C)] 98.1 F (36.7 C) (09/20 0811) Pulse Rate:  [56-80] 80 (09/20 0811) Resp:  [14-16] 16 (09/20 0811) BP: (130-152)/(40-77) 152/54 (09/20 0811) SpO2:  [95 %-100 %] 95 % (09/20 0811)     Height: '5\' 3"'$  (160 cm) Weight: 76.7 kg BMI (Calculated): 29.96   Physical Exam:  Constitutional: alert, cooperative and no distress  Respiratory: breathing non-labored at rest  Cardiovascular: regular rate and sinus rhythm  Gastrointestinal: soft, non-tender, and non-distended.  The wounds are dry and clean.  The colostomy is pink and patent.  Labs:     Latest Ref Rng & Units 11/25/2021    5:49 AM 11/22/2021    4:09 AM 11/21/2021    3:46 PM  CBC  WBC 4.0 - 10.5 K/uL 4.3  4.5  6.7   Hemoglobin 13.0 - 17.0 g/dL 12.0  14.1  15.4   Hematocrit 39.0 - 52.0 % 37.2  42.3  45.9   Platelets 150 - 400 K/uL 191  235  235       Latest Ref Rng & Units 11/28/2021    4:51 AM 11/27/2021    4:53 AM 11/25/2021    5:49 AM  CMP  Glucose 70 - 99 mg/dL  120  107   BUN 8 - 23 mg/dL  16  21   Creatinine 0.61 - 1.24 mg/dL 1.28  1.40  1.47   Sodium 135 - 145 mmol/L  138  140   Potassium 3.5 - 5.1 mmol/L  3.6  3.6   Chloride 98 - 111 mmol/L  104  106   CO2 22 - 32 mmol/L  27  26   Calcium 8.9 - 10.3 mg/dL  7.8  8.0     Imaging studies: CT scan of the abdomen and pelvis shows improved small bowel dilation.  No sign of obstruction.   No free fluid or free air.   Assessment/Plan:  81 y.o. male with small bowel obstruction 5 Day Post-Op s/p robotic assisted laparoscopic enterolysis.  Patient with expected postoperative course.  Stable vital signs.  No fever. -I discussed with patient that intermittent cramping is expected after surgery for bowel obstruction.  Most likely with low transit GI function.  There is no sign of obstruction.  Patient having adequate colostomy output.  Patient is requesting another day for pain control.  -I encouraged the patient to ambulate -Continue current diet  Arnold Long, MD

## 2021-11-29 DIAGNOSIS — N179 Acute kidney failure, unspecified: Secondary | ICD-10-CM | POA: Diagnosis not present

## 2021-11-29 DIAGNOSIS — K56600 Partial intestinal obstruction, unspecified as to cause: Secondary | ICD-10-CM | POA: Diagnosis not present

## 2021-11-29 DIAGNOSIS — I10 Essential (primary) hypertension: Secondary | ICD-10-CM | POA: Diagnosis not present

## 2021-11-29 LAB — GLUCOSE, CAPILLARY
Glucose-Capillary: 103 mg/dL — ABNORMAL HIGH (ref 70–99)
Glucose-Capillary: 113 mg/dL — ABNORMAL HIGH (ref 70–99)
Glucose-Capillary: 123 mg/dL — ABNORMAL HIGH (ref 70–99)

## 2021-11-29 LAB — CBC
HCT: 37.2 % — ABNORMAL LOW (ref 39.0–52.0)
Hemoglobin: 12.2 g/dL — ABNORMAL LOW (ref 13.0–17.0)
MCH: 29.4 pg (ref 26.0–34.0)
MCHC: 32.8 g/dL (ref 30.0–36.0)
MCV: 89.6 fL (ref 80.0–100.0)
Platelets: 233 10*3/uL (ref 150–400)
RBC: 4.15 MIL/uL — ABNORMAL LOW (ref 4.22–5.81)
RDW: 13.5 % (ref 11.5–15.5)
WBC: 4.1 10*3/uL (ref 4.0–10.5)
nRBC: 0 % (ref 0.0–0.2)

## 2021-11-29 LAB — BASIC METABOLIC PANEL
Anion gap: 8 (ref 5–15)
BUN: 17 mg/dL (ref 8–23)
CO2: 26 mmol/L (ref 22–32)
Calcium: 8.3 mg/dL — ABNORMAL LOW (ref 8.9–10.3)
Chloride: 103 mmol/L (ref 98–111)
Creatinine, Ser: 1.36 mg/dL — ABNORMAL HIGH (ref 0.61–1.24)
GFR, Estimated: 52 mL/min — ABNORMAL LOW (ref >60)
Glucose, Bld: 108 mg/dL — ABNORMAL HIGH (ref 70–99)
Potassium: 4.3 mmol/L (ref 3.5–5.1)
Sodium: 137 mmol/L (ref 135–145)

## 2021-11-29 MED ORDER — OXYCODONE HCL 5 MG PO TABS: 5.0000 mg | ORAL_TABLET | Freq: Four times a day (QID) | ORAL | 0 refills | Status: AC | PRN

## 2021-11-29 NOTE — Discharge Summary (Signed)
Physician Discharge Summary  Willie [name] KGM:[id number] DOB: [birth year] DOA: 11/21/2021  PCP: [name], MD  Admit date: 11/21/2021 Discharge date: 11/29/2021  Admitted From: home  Disposition:  home   Recommendations for Outpatient Follow-up:  Follow up with PCP in 1-2 weeks F/u w/ gen surg, Dr. Windell Moment, in 2 weeks   Home Health: no  Equipment/Devices:  Discharge Condition: stable  CODE STATUS: Full  Diet recommendation: DNR   Brief/Interim Summary: HPI was taken from Dr. Damita Dunnings: Willie [name] is a 81 y.o. male with medical history significant for DM, HTN, CAD, rectal cancer s/p colectomy, chemotherapy and XRT, with colostomy bag, prior admissions for small bowel obstruction most recently from 7/16 to 09/27/2021 who now presents to the ED with a 24-hour history of abdominal distention and no output through colostomy bag, and associated abdominal pain.  Patient went for routine surveillance CT abdomen and pelvis with contrast earlier in the day developed abdominal pain and vomiting Thus prompting the visit to the ED, but states that there has been no output in the bag for the past 24 hours.  Nephew at the bedside contributes to the history. ED course and data review: On arrival afebrile with pulse of 57 respirations 18 and BP 210/62 with O2 sat 99% on room air.  While in the ED he did become tachycardic to 107 and tachypneic to the mid to high 20s at the time of admission though BP did improve to SBP in the 160s.  Labs significant for creatinine of 1.56-1.34, up from normal baseline of 1.09 a month prior on 09-26-2021.  Labs otherwise unremarkable, including CMP and troponin to blood sugar slightly elevated at 171 and bicarb 19. CTA abdomen and pelvis with contrast showing the following: IMPRESSION: There is interval worsening of small bowel dilation in lower abdomen and pelvis. There is wall thickening in some of the dilated small bowel loops. There is fecalization in distal  ileum with change in caliber in right lower abdomen. Findings suggest partial small bowel obstruction with interval worsening. This may be due to adhesions. There is interval appearance of small ascites which may be due to small-bowel obstruction. There is wall thickening in some of the dilated distal small bowel loops suggesting possible enteritis.   There is no hydronephrosis. Appendix is not dilated. Diverticulosis of colon. 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   Other findings as described in the body of the report.     Patient treated with IV morphine for pain and a NS bolus.  The ED provider spoke with surgeon, Dr. [name] who recommended an NG tube and keep n.p.o. and will evaluate in the a.m.  Hospitalist subsequently consulted for admission.   As per Dr. Mal Misty: Willie [name] is a 82 y.o. male  with medical history significant for DM, HTN, CAD, rectal cancer s/p colectomy, chemotherapy and XRT, with colostomy bag, prior admissions for small bowel obstruction most recently from 7/16 to 09/27/2021.  He presented to the hospital with 24-hour history of abdominal pain, abdominal distention, vomiting, lack of output in the colostomy bag.  He had gone for routine surveillance CT chest, abdomen and pelvis with contrast prior to admission and he believes this triggered his symptoms.   He was admitted to the hospital for partial small bowel obstruction that was evident on repeat CT abdomen and pelvis.   As per Dr. Jimmye Norman 9/20-9/21/23: Pt was already s/p robotic assisted laparoscopic extensive enterolysis on 11/23/21 as per gen surg. Pt  was tolerating a po diet and output was present in ostomy bag prior to d/c. Pt denied any abd pain or cramps on the day of d/c. Pt was ambulating and transferring independently so therapy was not indicated. For more information, please see previous progress/consult notes.    Discharge Diagnoses:  Principal Problem:   Partial small bowel  obstruction (HCC) Active Problems:   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HCC)   Hypertensive urgency   AKI (acute kidney injury) ([place])   History of NSVT (nonsustained ventricular tachycardia) (HCC)   CAD (coronary artery disease)   Controlled type 2 diabetes mellitus without complication, with long-term current use of insulin (HCC)   Colostomy status (HCC) SBO: w/ hx of recurrent SBO with recent hospitalization from 7/16- 09/27/2021 for SBO. S/p robotic assisted laparoscopic extensive enterolysis on 11/23/2021. No abd pain or cramps today so far. Oxycodone prn   AKI: Cr is labile. Avoid nephrotoxic meds  Non-anion gap metabolic acidosis: resolved   Hypertensive urgency:  Urgency resolved but still w/ HTN. Continue lisinopril and metoprolol.   DM2: likely well controlled. Continue on SSI w/ accuchecks  Hx of NSVT: continue metoprolol   Discharge Instructions  Discharge Instructions     Diet - low sodium heart healthy   Complete by: As directed    Soft diet and advance as tolerated   Discharge instructions   Complete by: As directed    F/u w/ PCP in 1-2 weeks. F/u w/ general surg, Dr. Windell Moment, in 2 weeks   Increase activity slowly   Complete by: As directed    No wound care   Complete by: As directed       Allergies as of 11/29/2021   No Known Allergies      Medication List     STOP taking these medications    traMADol 50 MG tablet Commonly known as: Ultram       TAKE these medications    aspirin EC 81 MG tablet Take 81 mg by mouth 3 (three) times a week. Tues weds, thurs   brimonidine 0.2 % ophthalmic solution Commonly known as: ALPHAGAN Place 1 drop into both eyes 2 (two) times daily.   lisinopril 10 MG tablet Commonly known as: ZESTRIL Take 10 mg by mouth daily.   metoprolol succinate 50 MG 24 hr tablet Commonly known as: TOPROL-XL Take 50 mg by mouth daily.   Netarsudil-Latanoprost 0.02-0.005 % Soln Place 1 drop into both eyes at bedtime.   NovoLOG Mix 70/30  FlexPen (70-30) 100 UNIT/ML FlexPen Generic drug: insulin aspart protamine - aspart Inject 12 Units into the skin 2 (two) times daily with a meal.   oxyCODONE 5 MG immediate release tablet Commonly known as: Oxy IR/ROXICODONE Take 1 tablet (5 mg total) by mouth every 6 (six) hours as needed for up to 5 days for moderate pain or severe pain.   pantoprazole 40 MG tablet Commonly known as: PROTONIX Take 1 tablet (40 mg total) by mouth 2 (two) times daily before a meal. What changed: when to take this   rosuvastatin 10 MG tablet Commonly known as: CRESTOR Take 10 mg by mouth daily.   traZODone 50 MG tablet Commonly known as: DESYREL Take 50 mg by mouth at bedtime as needed for sleep.        Follow-up Information     [name], MD Follow up in 2 week(s).   Specialty: General Surgery Contact information: [address] [place] [address] [phone]  [name], MD Follow up.   Specialty: Internal Medicine Why: F/u in 1-2 weeks Contact information: [place] [address] [phone]                No Known Allergies  Consultations: General surg    Procedures/Studies: CT ABDOMEN PELVIS W CONTRAST  Result Date: 11/27/2021 CLINICAL DATA:  Rectal cancer, status post colectomy. Chemotherapy and radiation therapy. Abdominal pain/distension. Vomiting. Robot assisted diagnostic laparoscopy 4 days ago. * Tracking Code: BO * EXAM: CT ABDOMEN AND PELVIS WITH CONTRAST TECHNIQUE: Multidetector CT imaging of the abdomen and pelvis was performed using the standard protocol following bolus administration of intravenous contrast. RADIATION DOSE REDUCTION: This exam was performed according to the departmental dose-optimization program which includes automated exposure control, adjustment of the mA and/or kV according to patient size and/or use of iterative reconstruction technique. CONTRAST:  150m OMNIPAQUE IOHEXOL 300  MG/ML  SOLN COMPARISON:  11/21/2021 FINDINGS: Lower chest: Clear lung bases. Normal heart size without pericardial or pleural effusion. Hepatobiliary: Normal liver. Normal gallbladder, without biliary ductal dilatation. Pancreas: Normal, without mass or ductal dilatation. Spleen: Minimal subcapsular splenic hypoattenuation on 22/2 is similar back to 09/23/2021, possibly small volume infarct. Adrenals/Urinary Tract: Mild left adrenal thickening. Normal right adrenal gland. Interpolar right renal 1.9 cm cyst . In the absence of clinically indicated signs/symptoms require(s) no independent follow-up. Normal left kidney. No hydronephrosis. Normal urinary bladder. Stomach/Bowel: Normal stomach, without wall thickening. left abdominal transverse colostomy. Extensive diverticulosis throughout the remaining colon. Soft tissue density just left of the distal most colon (just proximal to the ostomy site) measures 2.8 x 2.7 cm on 48/2 and is new since 11/21/2021. Normal terminal ileum and appendix. Fluid-filled small bowel loops measure maximally 3.0 cm including on 52/2. Decreased from 3.3 cm on 11/21/2021. The distal ileum is relatively decompressed. Gradual transition in the right lower quadrant from dilated small bowel on 33/2 to decompressed small bowel on 53/2. No findings to suggest complicating ischemia. Vascular/Lymphatic: Advanced aortic and branch vessel atherosclerosis. No abdominopelvic adenopathy. Reproductive: Prostatectomy. Other: Right inguinal hernia contains fluid and is small. Resolved abdominopelvic fluid with mild interstitial thickening remaining in the deep pelvis. No free intraperitoneal air. Musculoskeletal: Presumably iatrogenic air about the anterior right pelvic wall. Degenerative changes of both hips. IMPRESSION: 1. Since the CT of 11/21/2021, improved appearance of probable low-grade partial small bowel obstruction, presumably secondary to adhesions in the distal ileum. No ischemia or other acute  complication. 2. Status post partial colectomy and transverse colostomy. New 2.8 cm soft tissue density adjacent the distal most colon. Appearance is nonspecific, especially given interval development over 5 days. Possibly postoperative such as small hematoma. Peritoneal metastasis could have this appearance but felt unlikely given time course of development. Recommend attention on follow-up. 3. Resolved abdominopelvic ascites. Electronically Signed   By: KAbigail MiyamotoM.D.   On: 11/27/2021 13:11   DG Abd 2 Views  Result Date: 11/23/2021 CLINICAL DATA:  Obstruction, status post colectomy EXAM: ABDOMEN - 2 VIEW COMPARISON:  11/21/2021 FINDINGS: Insert gastric tube with the tip projecting over the stomach. Distension of the small measuring up to 7 cm with contrast material within the small. Small amount in the colon. No evidence of pneumoperitoneum, portal venous gas or pneumatosis. No pathologic calcifications along the expected course of the ureters. No acute osseous abnormality. IMPRESSION: 1. Persistent small bowel distension which may reflect persistent small bowel obstruction versus ileus. 2. Nasogastric tube with the tip projecting over the stomach.  Electronically Signed   By: Kathreen Devoid M.D.   On: 11/23/2021 08:24   DG Abd Portable 1V-Small Bowel Obstruction Protocol-initial, 8 hr delay  Result Date: 11/22/2021 CLINICAL DATA:  Small bowel obstruction protocol. 8 hour delayed film. EXAM: PORTABLE ABDOMEN - 1 VIEW COMPARISON:  November 21, 2021 FINDINGS: Sub pathologic small-bowel distention. The distal small bowel loops are opacified. However no contrast is seen within the functional right colon. IMPRESSION: Sub pathologic small-bowel distention. No contrast is seen within the functional right colon, suggestive of distal small bowel obstruction. Electronically Signed   By: Fidela Salisbury M.D.   On: 11/22/2021 15:32   DG Abdomen 1 View  Result Date: 11/21/2021 CLINICAL DATA:  NG tube  placement. EXAM: ABDOMEN - 1 VIEW COMPARISON:  CT abdomen and pelvis 11/21/2021 FINDINGS: An enteric tube has been placed and terminates in the expected region of the gastric body with side port near the GE junction. Residual or contrast material is noted in multiple small bowel loops in the included portion of the abdomen. The included lung bases are grossly clear. IMPRESSION: Enteric tube in the proximal stomach with side port near the GE junction. Electronically Signed   By: Logan Bores M.D.   On: 11/21/2021 21:52   CT Abdomen Pelvis W Contrast  Result Date: 11/21/2021 CLINICAL DATA:  Abdominal pain, possible bowel obstruction EXAM: CT ABDOMEN AND PELVIS WITH CONTRAST TECHNIQUE: Multidetector CT imaging of the abdomen and pelvis was performed using the standard protocol following bolus administration of intravenous contrast. RADIATION DOSE REDUCTION: This exam was performed according to the departmental dose-optimization program which includes automated exposure control, adjustment of the mA and/or kV according to patient size and/or use of iterative reconstruction technique. CONTRAST:  24m OMNIPAQUE IOHEXOL 300 MG/ML  SOLN COMPARISON:  Previous studies including the examination done earlier today FINDINGS: Lower chest: Heart is enlarged in size. There is no focal consolidation in the visualized lower lung fields. Hepatobiliary: Liver is not enlarged. There is no dilation of bile ducts. There is high density in the lumen of gallbladder, most likely due to vicarious contrast excretion in the bile from previous CT. Pancreas: No focal abnormalities are seen. Spleen: Spleen is not enlarged. Adrenals/Urinary Tract: Adrenals are unremarkable. There is no hydronephrosis. There is 1.6 cm cyst in the lower pole of right kidney. Ureters are not dilated. There is contrast in the collecting systems and bladder from previous CT. Stomach/Bowel: Small hiatal hernia is seen. Stomach is unremarkable. Proximal small bowel  loops are not dilated. There is dilation of distal small bowel loops in lower abdomen and pelvis measuring up to 3.3 cm in diameter. There is fecalization in ileal loop slightly to the right of midline. There is change in caliber of small bowel loops in right lower abdomen with decompression of distal and terminal ileum. There is wall thickening in some of the dilated small bowel loops in lower abdomen. Appendix is not dilated. Numerous diverticula are seen in colon. There is transverse colostomy in left periumbilical region. Left colon is not seen and appears to have been removed. Vascular/Lymphatic: Scattered arterial calcifications are seen. No new significant lymphadenopathy is seen. Reproductive: Prostate is not seen. Other: There is interval appearance of small ascites. There is no pneumoperitoneum. There is fluid density in the right inguinal canal. Right inguinal hernia containing fat is seen. Musculoskeletal: Degenerative changes are noted in lower thoracic spine and lumbar spine. IMPRESSION: There is interval worsening of small bowel dilation in lower abdomen and pelvis. There  is wall thickening in some of the dilated small bowel loops. There is fecalization in distal ileum with change in caliber in right lower abdomen. Findings suggest partial small bowel obstruction with interval worsening. This may be due to adhesions. There is interval appearance of small ascites which may be due to small-bowel obstruction. There is wall thickening in some of the dilated distal small bowel loops suggesting possible enteritis. There is no hydronephrosis. Appendix is not dilated. Diverticulosis of colon. 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 Other findings as described in the body of the report. Electronically Signed   By: Elmer Picker M.D.   On: 11/21/2021 20:13   CT CHEST ABDOMEN PELVIS W CONTRAST  Result Date: 11/21/2021 CLINICAL DATA:  Rectal cancer, restaging EXAM: CT CHEST, ABDOMEN,  AND PELVIS WITH CONTRAST TECHNIQUE: Multidetector CT imaging of the chest, abdomen and pelvis was performed following the standard protocol during bolus administration of intravenous contrast. RADIATION DOSE REDUCTION: This exam was performed according to the departmental dose-optimization program which includes automated exposure control, adjustment of the mA and/or kV according to patient size and/or use of iterative reconstruction technique. CONTRAST:  58m OMNIPAQUE IOHEXOL 300 MG/ML  SOLN COMPARISON:  CT abdomen and pelvis 09/23/2021.  CT chest 05/17/2021. FINDINGS: CT CHEST FINDINGS Cardiovascular: Heart is normal size. Aorta is normal caliber. Coronary artery and aortic calcifications. Mediastinum/Nodes: No mediastinal, hilar, or axillary adenopathy. Trachea and esophagus are unremarkable. Thyroid unremarkable. Lungs/Pleura: Lungs are clear. No focal airspace opacities or suspicious nodules. No effusions. Musculoskeletal: Chest wall soft tissues are unremarkable. No acute bony abnormality. CT ABDOMEN PELVIS FINDINGS Hepatobiliary: No focal hepatic abnormality. Gallbladder unremarkable. Pancreas: No focal abnormality or ductal dilatation. Spleen: No focal abnormality.  Normal size. Adrenals/Urinary Tract: No adrenal abnormality. No suspicious focal renal abnormality. No stones or hydronephrosis. Urinary bladder is unremarkable. Stomach/Bowel: Prior partial colectomy with Hartmann's pouch and left lower quadrant ostomy. Again noted are mildly prominent pelvic small bowel loops, unchanged since prior study. Colonic diverticulosis. Normal appendix. Stomach unremarkable. Vascular/Lymphatic: Aortic atherosclerosis. No evidence of aneurysm or adenopathy. Reproductive: No visible focal abnormality.  Prior prostatectomy. Other: No free fluid or free air. Musculoskeletal: No acute bony abnormality. IMPRESSION: No acute findings or evidence of metastatic disease in the chest, abdomen or pelvis. Stable postoperative  changes with left lower quadrant ostomy. Coronary artery disease, aortic atherosclerosis. Electronically Signed   By: KRolm BaptiseM.D.   On: 11/21/2021 17:08   (Echo, Carotid, EGD, Colonoscopy, ERCP)    Subjective: Pt denies any complaints    Discharge Exam: Vitals:   11/29/21 0349 11/29/21 0744  BP: (!) 136/91 (!) 141/60  Pulse: 75 79  Resp: 18 18  Temp: 97.7 F (36.5 C) 97.9 F (36.6 C)  SpO2: 100% 100%   Vitals:   11/28/21 1641 11/28/21 1906 11/29/21 0349 11/29/21 0744  BP: (!) 170/66 (!) 151/48 (!) 136/91 (!) 141/60  Pulse: 88 (!) 54 75 79  Resp: '14 20 18 18  '$ Temp: 98.2 F (36.8 C) 99.1 F (37.3 C) 97.7 F (36.5 C) 97.9 F (36.6 C)  TempSrc: Oral Oral Oral Oral  SpO2: 100% 97% 100% 100%  Weight:      Height:        General: Pt is alert, awake, not in acute distress Cardiovascular: S1/S2 +, no rubs, no gallops Respiratory: CTA bilaterally, no wheezing, no rhonchi Abdominal: Soft, NT, ND, bowel sounds + Extremities: no edema, no cyanosis    The results of significant diagnostics from this hospitalization (  including imaging, microbiology, ancillary and laboratory) are listed below for reference.     Microbiology: No results found for this or any previous visit (from the past 240 hour(s)).   Labs: BNP (last 3 results) No results for input(s): "BNP" in the last 8760 hours. Basic Metabolic Panel: Recent Labs  Lab 11/23/21 0409 11/25/21 0549 11/27/21 0453 11/28/21 0451 11/29/21 0612  NA 138 140 138  --  137  K 4.2 3.6 3.6  --  4.3  CL 102 106 104  --  103  CO2 21* 26 27  --  26  GLUCOSE 159* 107* 120*  --  108*  BUN '16 21 16  '$ --  17  CREATININE 1.22 1.47* 1.40* 1.28* 1.36*  CALCIUM 8.6* 8.0* 7.8*  --  8.3*  MG  --  2.3  --   --   --    Liver Function Tests: No results for input(s): "AST", "ALT", "ALKPHOS", "BILITOT", "PROT", "ALBUMIN" in the last 168 hours. No results for input(s): "LIPASE", "AMYLASE" in the last 168 hours. No results for  input(s): "AMMONIA" in the last 168 hours. CBC: Recent Labs  Lab 11/25/21 0549 11/29/21 0612  WBC 4.3 4.1  NEUTROABS 2.6  --   HGB 12.0* 12.2*  HCT 37.2* 37.2*  MCV 90.7 89.6  PLT 191 233   Cardiac Enzymes: No results for input(s): "CKTOTAL", "CKMB", "CKMBINDEX", "TROPONINI" in the last 168 hours. BNP: Invalid input(s): "POCBNP" CBG: Recent Labs  Lab 11/28/21 1708 11/28/21 1907 11/29/21 0008 11/29/21 0346 11/29/21 0740  GLUCAP 155* 141* 123* 103* 113*   D-Dimer No results for input(s): "DDIMER" in the last 72 hours. Hgb A1c No results for input(s): "HGBA1C" in the last 72 hours. Lipid Profile No results for input(s): "CHOL", "HDL", "LDLCALC", "TRIG", "CHOLHDL", "LDLDIRECT" in the last 72 hours. Thyroid function studies No results for input(s): "TSH", "T4TOTAL", "T3FREE", "THYROIDAB" in the last 72 hours.  Invalid input(s): "FREET3" Anemia work up No results for input(s): "VITAMINB12", "FOLATE", "FERRITIN", "TIBC", "IRON", "RETICCTPCT" in the last 72 hours. Urinalysis    Component Value Date/Time   COLORURINE YELLOW (A) 09/23/2021 0521   APPEARANCEUR HAZY (A) 09/23/2021 0521   LABSPEC 1.020 09/23/2021 0521   PHURINE 5.0 09/23/2021 0521   GLUCOSEU NEGATIVE 09/23/2021 0521   HGBUR SMALL (A) 09/23/2021 0521   BILIRUBINUR NEGATIVE 09/23/2021 0521   KETONESUR 20 (A) 09/23/2021 0521   PROTEINUR 100 (A) 09/23/2021 0521   NITRITE NEGATIVE 09/23/2021 0521   LEUKOCYTESUR NEGATIVE 09/23/2021 0521   Sepsis Labs Recent Labs  Lab 11/25/21 0549 11/29/21 0612  WBC 4.3 4.1   Microbiology No results found for this or any previous visit (from the past 240 hour(s)).   Time coordinating discharge: Over 30 minutes  SIGNED:   Wyvonnia Dusky, MD  [facility] 11/29/2021, 11:16 AM Pager   If 7PM-7AM, please contact night-coverage [email or web address]

## 2021-11-29 NOTE — Plan of Care (Signed)
IV removed, discharge instructions reviewed and patient discharged to home with nephew

## 2021-11-29 NOTE — Progress Notes (Signed)
Mobility Specialist - Progress Note   11/29/21 0941  Mobility  Activity Ambulated independently in hallway;Stood at bedside;Dangled on edge of bed  Level of Assistance Independent after set-up  Assistive Device Front wheel walker  Distance Ambulated (ft) 800 ft  Activity Response Tolerated well  $Mobility charge 1 Mobility   Pt supine in bed on RA upon arrival. Pt STS with HHA and ambulates 5 laps around NS Indep. Pt returns to bed with needs in reach.   Gretchen Short  Mobility Specialist  11/29/21 9:42 AM

## 2021-12-03 ENCOUNTER — Inpatient Hospital Stay: Payer: Medicare PPO | Admitting: Oncology

## 2021-12-03 ENCOUNTER — Encounter: Payer: Self-pay | Admitting: Oncology

## 2021-12-03 VITALS — BP 142/84 | HR 89 | Resp 16 | Wt 165.0 lb

## 2021-12-03 DIAGNOSIS — Z08 Encounter for follow-up examination after completed treatment for malignant neoplasm: Secondary | ICD-10-CM

## 2021-12-03 DIAGNOSIS — Z85048 Personal history of other malignant neoplasm of rectum, rectosigmoid junction, and anus: Secondary | ICD-10-CM | POA: Diagnosis not present

## 2021-12-03 DIAGNOSIS — C19 Malignant neoplasm of rectosigmoid junction: Secondary | ICD-10-CM | POA: Diagnosis not present

## 2021-12-03 NOTE — Progress Notes (Signed)
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  He was recently found to have iron deficiency anemia which led to a colonoscopy On 07/15/2018.  Colonoscopy showed a villous partially obstructing medium-sized mass in the mid sigmoid colon.  The mass was partially circumferential measuring 5 cm.  There was another infiltrative partially obstructing medium-sized mass found in the rectosigmoid colon which also measured 5 cm.  Pathology from the sigmoid colon mass showed high-grade dysplasia at least involving an adenomatous lesion with villous features.  A more serious process is not excluded.  Rectosigmoid mass biopsy showed invasive colorectal adenocarcinoma.   Patient was referred to Dr. [name] for surgical management.  Patient had a CT abdomen and pelvis with contrast which showed irregular asymmetric mural thickening of the proximal rectum measuring over 6 to 7 cm.  Potential local nodal metastases with several subcentimeter iliac lymph nodes and presacral lymph nodes.  No evidence of metastatic disease outside the pelvis.   Patient also had MRI of the pelvis with and without contrast which showed tumor length 8.5 cm with extension through muscularis propria decreased C.   There is diffuse involvement of muscularis propria.  No extramural vascular invasion or tumor thrombus.  Shortest distance of any tumor/note from mesorectal fascia 1 to 2 mm.  No involvement of adjacent organs or pelvic sidewall.  Mesorectal lymph nodes within perirectal fat including 6 mm node and a low sigmoid mesocolon node measuring 6 mm.  Another 6 mm node within the low sigmoid mesocolon representing extremity rectal lymphadenopathy.  T3N2 by MRI.  Distance from tumor to internal anal sphincter is 6.2 cm.   Patient completed 8 cycles of neoadjuvant FOLFOX chemotherapy on 12/07/2018.  Interim scans showed similar-appearing irregular wall thickening of the rectum but no retroperitoneal pelvic adenopathy.  Patient completed concurrent chemoradiation end of November 2020.   Patient had partial colectomy with a colostomy at [facility] in March 2021.  Final pathology showed invasive adenocarcinoma 6 cm grade 2.  Tumor deposit present.  Tumor invades through muscularis propria into pericolorectal tissue.  Lymphovascular invasion present.  Perineural invasion absent.  Treatment effect absent.  Extensive residual cancer with no evident tumor regression.  Poor to no response.  Margins negative.  1 out of 39 lymph nodes positive.  yPT3 pN1a  Interval history-after patient had a CT scanIn early September for rectal cancer surveillance he was in the hospital for symptoms of abdominal distention and vomiting and no output from the colostomy and was found to have small bowel obstruction secondary to adhesions requiring operative management.  He is presently doing well postsurgery.  He has not had these problems before with prior CT scans  ECOG PS- 1 Pain scale- 0   Review of systems- Review of Systems  Constitutional:  Positive for malaise/fatigue. Negative for chills, fever and weight loss.  HENT:  Negative for congestion,  ear discharge and nosebleeds.   Eyes:  Negative for blurred vision.  Respiratory:  Negative  for cough, hemoptysis, sputum production, shortness of breath and wheezing.   Cardiovascular:  Negative for chest pain, palpitations, orthopnea and claudication.  Gastrointestinal:  Negative for abdominal pain, blood in stool, constipation, diarrhea, heartburn, melena, nausea and vomiting.  Genitourinary:  Negative for dysuria, flank pain, frequency, hematuria and urgency.  Musculoskeletal:  Negative for back pain, joint pain and myalgias.  Skin:  Negative for rash.  Neurological:  Negative for dizziness, tingling, focal weakness, seizures, weakness and headaches.  Endo/Heme/Allergies:  Does not bruise/bleed easily.  Psychiatric/Behavioral:  Negative for depression and suicidal ideas. The patient does not have insomnia.       No Known Allergies   Past Medical History:  Diagnosis Date   Anemia    Coronary artery disease    Diabetes mellitus without complication (HCC)    GERD (gastroesophageal reflux disease)    History of hiatal hernia    Hypercholesterolemia    Hypertension    Prostate cancer ([place]) 2000   Prostatectomy.    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[place]) 09/2018   Chemo tx's.    Wears dentures    partial lower     Past Surgical History:  Procedure Laterality Date   CATARACT EXTRACTION W/PHACO Right 10/12/2019   Procedure: CATARACT EXTRACTION PHACO AND INTRAOCULAR LENS PLACEMENT (IOC) RIGHT DIABETIC 6.35 00:37.1;  Surgeon: Birder Robson, MD;  Location: [place];  Service: Ophthalmology;  Laterality: Right;   COLONOSCOPY N/A 05/02/2021   Procedure: COLONOSCOPY;  Surgeon: [name], [name], MD;  Location: [facility] ENDOSCOPY;  Service: Gastroenterology;  Laterality: N/A;  IDDM   COLONOSCOPY WITH PROPOFOL N/A 07/15/2018   Procedure: COLONOSCOPY WITH PROPOFOL;  Surgeon: [name], [name], MD;  Location: [facility] END[facility];  Service: Gastroenterology;  Laterality: N/A;   ESOPHAGOGASTRODUODENOSCOPY N/A 10/12/2018   Procedure: ESOPHAGOGASTRODUODENOSCOPY (EGD);  Surgeon: [name], MD;   Location: [facility] ENDOSCOPY;  Service: Gastroenterology;  Laterality: N/A;   ESOPHAGOGASTRODUODENOSCOPY (EGD) WITH PROPOFOL N/A 07/15/2018   Procedure: ESOPHAGOGASTRODUODENOSCOPY (EGD) WITH PROPOFOL;  Surgeon: [name], [name], MD;  Location: [facility] END[facility];  Service: Gastroenterology;  Laterality: N/A;   PORTACATH PLACEMENT N/A 08/13/2018   Procedure: INSERTION PORT-A-CATH;  Surgeon: [name], MD;  Location: [facility];  Service: General;  Laterality: N/A;   PROSTATE SURGERY     ROBOT ASSISTED LAPAROSCOPIC PARTIAL COLECTOMY  05/12/2019   [place]   XI ROBOT ASSISTED DIAGNOSTIC LAPAROSCOPY N/A 11/23/2021   Procedure: XI ROBOT ASSISTED DIAGNOSTIC LAPAROSCOPY;  Surgeon: [name], MD;  Location: [facility] OR[facility];  Service: General;  Laterality: N/A;    Social History   Socioeconomic History   Marital status: Widowed    Spouse name: Not on file   Number of children: 0   Years of education: Not on file   Highest education level: Not on file  Occupational History   Occupation: Pharmacist, hospital    Comment: Retired  Tobacco Use   Smoking status: Never   Smokeless tobacco: Never  Vaping Use   Vaping Use: Never used  Substance and Sexual Activity   Alcohol use: Not Currently   Drug use: Never   Sexual activity: Not Currently  Other Topics Concern   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  His wife was a Camera operator and they traveled frequently.  Several nieces and nephews.  He  is the youngest of his brothers and sisters and only surviving.   Social Determinants of Health   Financial Resource Strain: Low Risk  (11/01/2017)   Overall Financial Resource Strain (CARDIA)    Difficulty of Paying Living Expenses: Not hard at all  Food Insecurity: No Food Insecurity (11/22/2021)   Hunger Vital Sign    Worried About Running Out of Food in the Last Year: Never  true    Ran Out of Food in the Last Year: Never true  Transportation Needs: No Transportation Needs (11/22/2021)   PRAPARE - Hydrologist (Medical): No    Lack of Transportation (Non-Medical): No  Physical Activity: Insufficiently Active (11/01/2017)   Exercise Vital Sign    Days of Exercise per Week: 2 days    Minutes of Exercise per Session: 30 min  Stress: No Stress Concern Present (11/01/2017)   [place]    Feeling of Stress : Only a little  Social Connections: Unknown (08/31/2018)   Social Connection and Isolation Panel [NHANES]    Frequency of Communication with Friends and Family: More than three times a week    Frequency of Social Gatherings with Friends and Family: More than three times a week    Attends Religious Services: Not on file    Active Member of [place] or Organizations: Not on file    Attends Archivist Meetings: Not on file    Marital Status: Not on file  Intimate Partner Violence: Not At Risk (11/22/2021)   Humiliation, Afraid, Rape, and Kick questionnaire    Fear of Current or Ex-Partner: No    Emotionally Abused: No    Physically Abused: No    Sexually Abused: No    Family History  Problem Relation Age of Onset   Diabetes Mother    Ovarian cancer Sister      Current Outpatient Medications:    aspirin EC 81 MG tablet, Take 81 mg by mouth 3 (three) times a week. Tues weds, thurs, Disp: , Rfl:    brimonidine (ALPHAGAN) 0.2 % ophthalmic solution, Place 1 drop into both eyes 2 (two) times daily., Disp: , Rfl:    lisinopril (ZESTRIL) 10 MG tablet, Take 10 mg by mouth daily., Disp: , Rfl:    metoprolol succinate (TOPROL-XL) 50 MG 24 hr tablet, Take 50 mg by mouth daily., Disp: , Rfl:    Netarsudil-Latanoprost 0.02-0.005 % SOLN, Place 1 drop into both eyes at bedtime., Disp: , Rfl:    NOVOLOG MIX 70/30 FLEXPEN (70-30) 100 UNIT/ML FlexPen, Inject 12 Units into  the skin 2 (two) times daily with a meal., Disp: , Rfl:    oxyCODONE (OXY IR/ROXICODONE) 5 MG immediate release tablet, Take 1 tablet (5 mg total) by mouth every 6 (six) hours as needed for up to 5 days for moderate pain or severe pain., Disp: 20 tablet, Rfl: 0   pantoprazole (PROTONIX) 40 MG tablet, Take 1 tablet (40 mg total) by mouth 2 (two) times daily before a meal. (Patient taking differently: Take 40 mg by mouth daily.), Disp: 60 tablet, Rfl: 0   rosuvastatin (CRESTOR) 10 MG tablet, Take 10 mg by mouth daily., Disp: , Rfl:    traZODone (DESYREL) 50 MG tablet, Take 50 mg by mouth at bedtime as needed for sleep. , Disp: , Rfl:  No current facility-administered medications for this visit.  Facility-Administered Medications Ordered in Other Visits:    heparin lock flush 100 unit/mL, 500  Units, Intravenous, Once, Sindy Guadeloupe, MD   sodium chloride flush (NS) 0.9 % injection 10 mL, 10 mL, Intravenous, Once, Sindy Guadeloupe, MD   sodium chloride flush (NS) 0.9 % injection 10 mL, 10 mL, Intravenous, Once, Sindy Guadeloupe, MD   sodium chloride flush (NS) 0.9 % injection 10 mL, 10 mL, Intravenous, PRN, Sindy Guadeloupe, MD  Physical exam:  Vitals:   12/03/21 1353  BP: (!) 142/84  Pulse: 89  Resp: 16  SpO2: 100%  Weight: 165 lb (74.8 kg)   Physical Exam Constitutional:      General: He is not in acute distress. Cardiovascular:     Rate and Rhythm: Normal rate and regular rhythm.     Heart sounds: Normal heart sounds.  Pulmonary:     Effort: Pulmonary effort is normal.     Breath sounds: Normal breath sounds.  Abdominal:     General: Bowel sounds are normal.     Palpations: Abdomen is soft.     Comments: Colostomy in place.  Scars of recent laparoscopy surgery healing well  Skin:    General: Skin is warm and dry.  Neurological:     Mental Status: He is alert and oriented to person, place, and time.   Year     Latest Ref Rng & Units 11/29/2021    6:12 AM  CMP  Glucose 70 - 99  mg/dL 108   BUN 8 - 23 mg/dL 17   Creatinine 0.61 - 1.24 mg/dL 1.36   Sodium 135 - 145 mmol/L 137   Potassium 3.5 - 5.1 mmol/L 4.3   Chloride 98 - 111 mmol/L 103   CO2 22 - 32 mmol/L 26   Calcium 8.9 - 10.3 mg/dL 8.3       Latest Ref Rng & Units 11/29/2021    6:12 AM  CBC  WBC 4.0 - 10.5 K/uL 4.1   Hemoglobin 13.0 - 17.0 g/dL 12.2   Hematocrit 39.0 - 52.0 % 37.2   Platelets 150 - 400 K/uL 233     No images are attached to the encounter.  CT ABDOMEN PELVIS W CONTRAST  Result Date: 11/27/2021 CLINICAL DATA:  Rectal cancer, status post colectomy. Chemotherapy and radiation therapy. Abdominal pain/distension. Vomiting. Robot assisted diagnostic laparoscopy 4 days ago. * Tracking Code: BO * EXAM: CT ABDOMEN AND PELVIS WITH CONTRAST TECHNIQUE: Multidetector CT imaging of the abdomen and pelvis was performed using the standard protocol following bolus administration of intravenous contrast. RADIATION DOSE REDUCTION: This exam was performed according to the departmental dose-optimization program which includes automated exposure control, adjustment of the mA and/or kV according to patient size and/or use of iterative reconstruction technique. CONTRAST:  166m OMNIPAQUE IOHEXOL 300 MG/ML  SOLN COMPARISON:  11/21/2021 FINDINGS: Lower chest: Clear lung bases. Normal heart size without pericardial or pleural effusion. Hepatobiliary: Normal liver. Normal gallbladder, without biliary ductal dilatation. Pancreas: Normal, without mass or ductal dilatation. Spleen: Minimal subcapsular splenic hypoattenuation on 22/2 is similar back to 09/23/2021, possibly small volume infarct. Adrenals/Urinary Tract: Mild left adrenal thickening. Normal right adrenal gland. Interpolar right renal 1.9 cm cyst . In the absence of clinically indicated signs/symptoms require(s) no independent follow-up. Normal left kidney. No hydronephrosis. Normal urinary bladder. Stomach/Bowel: Normal stomach, without wall thickening. left  abdominal transverse colostomy. Extensive diverticulosis throughout the remaining colon. Soft tissue density just left of the distal most colon (just proximal to the ostomy site) measures 2.8 x 2.7 cm on 48/2 and is new since 11/21/2021. Normal terminal  ileum and appendix. Fluid-filled small bowel loops measure maximally 3.0 cm including on 52/2. Decreased from 3.3 cm on 11/21/2021. The distal ileum is relatively decompressed. Gradual transition in the right lower quadrant from dilated small bowel on 33/2 to decompressed small bowel on 53/2. No findings to suggest complicating ischemia. Vascular/Lymphatic: Advanced aortic and branch vessel atherosclerosis. No abdominopelvic adenopathy. Reproductive: Prostatectomy. Other: Right inguinal hernia contains fluid and is small. Resolved abdominopelvic fluid with mild interstitial thickening remaining in the deep pelvis. No free intraperitoneal air. Musculoskeletal: Presumably iatrogenic air about the anterior right pelvic wall. Degenerative changes of both hips. IMPRESSION: 1. Since the CT of 11/21/2021, improved appearance of probable low-grade partial small bowel obstruction, presumably secondary to adhesions in the distal ileum. No ischemia or other acute complication. 2. Status post partial colectomy and transverse colostomy. New 2.8 cm soft tissue density adjacent the distal most colon. Appearance is nonspecific, especially given interval development over 5 days. Possibly postoperative such as small hematoma. Peritoneal metastasis could have this appearance but felt unlikely given time course of development. Recommend attention on follow-up. 3. Resolved abdominopelvic ascites. Electronically Signed   By: Abigail Miyamoto M.D.   On: 11/27/2021 13:11   DG Abd 2 Views  Result Date: 11/23/2021 CLINICAL DATA:  Obstruction, status post colectomy EXAM: ABDOMEN - 2 VIEW COMPARISON:  11/21/2021 FINDINGS: Insert gastric tube with the tip projecting over the stomach. Distension  of the small measuring up to 7 cm with contrast material within the small. Small amount in the colon. No evidence of pneumoperitoneum, portal venous gas or pneumatosis. No pathologic calcifications along the expected course of the ureters. No acute osseous abnormality. IMPRESSION: 1. Persistent small bowel distension which may reflect persistent small bowel obstruction versus ileus. 2. Nasogastric tube with the tip projecting over the stomach. Electronically Signed   By: Kathreen Devoid M.D.   On: 11/23/2021 08:24   DG Abd Portable 1V-Small Bowel Obstruction Protocol-initial, 8 hr delay  Result Date: 11/22/2021 CLINICAL DATA:  Small bowel obstruction protocol. 8 hour delayed film. EXAM: PORTABLE ABDOMEN - 1 VIEW COMPARISON:  November 21, 2021 FINDINGS: Sub pathologic small-bowel distention. The distal small bowel loops are opacified. However no contrast is seen within the functional right colon. IMPRESSION: Sub pathologic small-bowel distention. No contrast is seen within the functional right colon, suggestive of distal small bowel obstruction. Electronically Signed   By: Fidela Salisbury M.D.   On: 11/22/2021 15:32   DG Abdomen 1 View  Result Date: 11/21/2021 CLINICAL DATA:  NG tube placement. EXAM: ABDOMEN - 1 VIEW COMPARISON:  CT abdomen and pelvis 11/21/2021 FINDINGS: An enteric tube has been placed and terminates in the expected region of the gastric body with side port near the GE junction. Residual or contrast material is noted in multiple small bowel loops in the included portion of the abdomen. The included lung bases are grossly clear. IMPRESSION: Enteric tube in the proximal stomach with side port near the GE junction. Electronically Signed   By: Logan Bores M.D.   On: 11/21/2021 21:52   CT Abdomen Pelvis W Contrast  Result Date: 11/21/2021 CLINICAL DATA:  Abdominal pain, possible bowel obstruction EXAM: CT ABDOMEN AND PELVIS WITH CONTRAST TECHNIQUE: Multidetector CT imaging of the abdomen  and pelvis was performed using the standard protocol following bolus administration of intravenous contrast. RADIATION DOSE REDUCTION: This exam was performed according to the departmental dose-optimization program which includes automated exposure control, adjustment of the mA and/or kV according to patient size and/or use  of iterative reconstruction technique. CONTRAST:  50m OMNIPAQUE IOHEXOL 300 MG/ML  SOLN COMPARISON:  Previous studies including the examination done earlier today FINDINGS: Lower chest: Heart is enlarged in size. There is no focal consolidation in the visualized lower lung fields. Hepatobiliary: Liver is not enlarged. There is no dilation of bile ducts. There is high density in the lumen of gallbladder, most likely due to vicarious contrast excretion in the bile from previous CT. Pancreas: No focal abnormalities are seen. Spleen: Spleen is not enlarged. Adrenals/Urinary Tract: Adrenals are unremarkable. There is no hydronephrosis. There is 1.6 cm cyst in the lower pole of right kidney. Ureters are not dilated. There is contrast in the collecting systems and bladder from previous CT. Stomach/Bowel: Small hiatal hernia is seen. Stomach is unremarkable. Proximal small bowel loops are not dilated. There is dilation of distal small bowel loops in lower abdomen and pelvis measuring up to 3.3 cm in diameter. There is fecalization in ileal loop slightly to the right of midline. There is change in caliber of small bowel loops in right lower abdomen with decompression of distal and terminal ileum. There is wall thickening in some of the dilated small bowel loops in lower abdomen. Appendix is not dilated. Numerous diverticula are seen in colon. There is transverse colostomy in left periumbilical region. Left colon is not seen and appears to have been removed. Vascular/Lymphatic: Scattered arterial calcifications are seen. No new significant lymphadenopathy is seen. Reproductive: Prostate is not seen.  Other: There is interval appearance of small ascites. There is no pneumoperitoneum. There is fluid density in the right inguinal canal. Right inguinal hernia containing fat is seen. Musculoskeletal: Degenerative changes are noted in lower thoracic spine and lumbar spine. IMPRESSION: There is interval worsening of small bowel dilation in lower abdomen and pelvis. There is wall thickening in some of the dilated small bowel loops. There is fecalization in distal ileum with change in caliber in right lower abdomen. Findings suggest partial small bowel obstruction with interval worsening. This may be due to adhesions. There is interval appearance of small ascites which may be due to small-bowel obstruction. There is wall thickening in some of the dilated distal small bowel loops suggesting possible enteritis. There is no hydronephrosis. Appendix is not dilated. Diverticulosis of colon. 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 Other findings as described in the body of the report. Electronically Signed   By: PElmer PickerM.D.   On: 11/21/2021 20:13   CT CHEST ABDOMEN PELVIS W CONTRAST  Result Date: 11/21/2021 CLINICAL DATA:  Rectal cancer, restaging EXAM: CT CHEST, ABDOMEN, AND PELVIS WITH CONTRAST TECHNIQUE: Multidetector CT imaging of the chest, abdomen and pelvis was performed following the standard protocol during bolus administration of intravenous contrast. RADIATION DOSE REDUCTION: This exam was performed according to the departmental dose-optimization program which includes automated exposure control, adjustment of the mA and/or kV according to patient size and/or use of iterative reconstruction technique. CONTRAST:  867mOMNIPAQUE IOHEXOL 300 MG/ML  SOLN COMPARISON:  CT abdomen and pelvis 09/23/2021.  CT chest 05/17/2021. FINDINGS: CT CHEST FINDINGS Cardiovascular: Heart is normal size. Aorta is normal caliber. Coronary artery and aortic calcifications. Mediastinum/Nodes: No  mediastinal, hilar, or axillary adenopathy. Trachea and esophagus are unremarkable. Thyroid unremarkable. Lungs/Pleura: Lungs are clear. No focal airspace opacities or suspicious nodules. No effusions. Musculoskeletal: Chest wall soft tissues are unremarkable. No acute bony abnormality. CT ABDOMEN PELVIS FINDINGS Hepatobiliary: No focal hepatic abnormality. Gallbladder unremarkable. Pancreas: No focal abnormality or ductal dilatation. Spleen:  No focal abnormality.  Normal size. Adrenals/Urinary Tract: No adrenal abnormality. No suspicious focal renal abnormality. No stones or hydronephrosis. Urinary bladder is unremarkable. Stomach/Bowel: Prior partial colectomy with Hartmann's pouch and left lower quadrant ostomy. Again noted are mildly prominent pelvic small bowel loops, unchanged since prior study. Colonic diverticulosis. Normal appendix. Stomach unremarkable. Vascular/Lymphatic: Aortic atherosclerosis. No evidence of aneurysm or adenopathy. Reproductive: No visible focal abnormality.  Prior prostatectomy. Other: No free fluid or free air. Musculoskeletal: No acute bony abnormality. IMPRESSION: No acute findings or evidence of metastatic disease in the chest, abdomen or pelvis. Stable postoperative changes with left lower quadrant ostomy. Coronary artery disease, aortic atherosclerosis. Electronically Signed   By: Rolm Baptise M.D.   On: 11/21/2021 17:08     Assessment and plan- Patient is a 81 y.o. male here for routine follow-up of rectal cancer  I have reviewed CT abdomen pelvis images independently.  Recent CT scan in September 23 did not show any evidence of recurrent or progressive disease.  He did develop small bowel obstruction following the CT scan which required surgical management.  Likely secondary to prior lesions from surgery.  He has not had these issues with prior CT scans but I will see if there is a way we could change his contrast to prevent such episodes in the future.  I will see him  back in 6 months with CBC with differential CMP CEA with CT chest abdomen and pelvis with contrast prior   Visit Diagnosis 1. Encounter for follow-up surveillance of rectal cancer      Dr. Randa Evens, MD, MPH [facility] at [facility] [phone] 12/03/2021 4:28 PM

## 2021-12-12 ENCOUNTER — Encounter: Payer: Self-pay | Admitting: Radiation Oncology

## 2021-12-12 ENCOUNTER — Ambulatory Visit
Admission: RE | Admit: 2021-12-12 | Discharge: 2021-12-12 | Disposition: A | Discharge status: Home / Self Care | Payer: Medicare PPO | Source: Ambulatory Visit | Attending: Radiation Oncology | Admitting: Radiation Oncology

## 2021-12-12 VITALS — BP 144/70 | HR 62 | Temp 97.6°F | Resp 16 | Wt 168.4 lb

## 2021-12-12 DIAGNOSIS — C20 Malignant neoplasm of rectum: Secondary | ICD-10-CM | POA: Insufficient documentation

## 2021-12-12 DIAGNOSIS — Z923 Personal history of irradiation: Secondary | ICD-10-CM | POA: Diagnosis not present

## 2021-12-12 DIAGNOSIS — Z9221 Personal history of antineoplastic chemotherapy: Secondary | ICD-10-CM | POA: Insufficient documentation

## 2021-12-12 NOTE — Progress Notes (Signed)
Radiation Oncology Follow up Note  Name: Willie [name]   Date:   12/12/2021 MRN:  [id number] DOB: [birth year]    This 81 y.o. 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  Time of surgery had 1 of 39 nodes positive still had a 6 cm mass with lymph-vascular invasion.  Seen today in routine follow-up he is doing well recently had a bowel obstruction secondary to a CT scan with barium.  That did resolve.  His most recent CT scan.  Showed improved appearance of low-grade partial small bowel obstruction most likely secondary to adhesions in the distal ileum.  He had a 2.8 cm soft tissue mass in the distal part of the colon developed over 5 days after postoperative treatment for his small bowel obstruction.  His CEA may remains low at 0.6.  COMPLICATIONS OF TREATMENT: none  FOLLOW UP COMPLIANCE: keeps appointments   PHYSICAL EXAM:  BP (!) 144/70 (BP Location: Left Arm, Patient Position: Sitting, Cuff Size: Normal)   Pulse 62   Temp 97.6 F (36.4 C) (Tympanic)   Resp 16   Wt 168 lb 6.4 oz (76.4 kg)   BMI 29.83 kg/m  Colostomy seems to be functioning well.  Well-developed well-nourished patient in NAD. HEENT reveals PERLA, EOMI, discs not visualized.  Oral cavity is clear. No oral mucosal lesions are identified. Neck is clear without evidence of cervical or supraclavicular adenopathy. Lungs are clear to A&P. Cardiac examination is essentially unremarkable with regular rate and rhythm without murmur rub or thrill. Abdomen is benign with no organomegaly  or masses noted. Motor sensory and DTR levels are equal and symmetric in the upper and lower extremities. Cranial nerves II through XII are grossly intact. Proprioception is intact. No peripheral adenopathy or edema is identified. No motor or sensory levels are noted. Crude visual fields are within normal range.  RADIOLOGY RESULTS: CT scan reviewed compatible with above-stated findings  PLAN: Present time patient is doing well no evidence of disease 2 and half years out from concurrent chemoradiation therapy followed by definitive surgery.  And pleased with his overall progress.  I am going to turn follow-up care over to medical oncology.  I be happy to reevaluate the patient anytime should that be indicated.  I would like to take this opportunity to thank you for allowing me to participate in the care of your patient.Noreene Filbert, MD

## 2021-12-31 ENCOUNTER — Telehealth: Payer: Self-pay | Admitting: *Deleted

## 2021-12-31 NOTE — Telephone Encounter (Signed)
Call received reporting that patient is having same symptoms (nausea vomiting clear mucous, decreased colostomy output) as when he was admitted and had to have surgery for a bowel blockage. Asking to speak with Dr Janese Banks. I explained that she is out of office  and asked if they had called Dr Windell Moment and was told no. I asked that he call Dr Ferrel Logan and he asked for the number and I gave it to him.He said that he would call Dr Windell Moment

## 2022-01-03 ENCOUNTER — Telehealth: Payer: Self-pay

## 2022-01-03 NOTE — Telephone Encounter (Signed)
Judeen Hammans- can you check if patient saw dr. Ferrel Logan? This would be a surgery issue

## 2022-01-03 NOTE — Telephone Encounter (Signed)
Reached out to pt to check if symptoms were still occuring (nausea and abdominal pain). Pt states his nephew reached out to Dr. Ferrel Logan on Monday who recommended pt to take a laxative. Pt states the laxative has helped alot and is no longer experiencing nausea or abdominal pain. Pt thanked me for reaching out to check on him.

## 2022-05-13 ENCOUNTER — Inpatient Hospital Stay: Payer: Medicare PPO | Attending: Oncology

## 2022-05-13 ENCOUNTER — Telehealth: Payer: Self-pay

## 2022-05-13 DIAGNOSIS — I10 Essential (primary) hypertension: Secondary | ICD-10-CM | POA: Diagnosis not present

## 2022-05-13 DIAGNOSIS — C19 Malignant neoplasm of rectosigmoid junction: Secondary | ICD-10-CM | POA: Diagnosis not present

## 2022-05-13 DIAGNOSIS — Z8546 Personal history of malignant neoplasm of prostate: Secondary | ICD-10-CM | POA: Diagnosis not present

## 2022-05-13 DIAGNOSIS — E119 Type 2 diabetes mellitus without complications: Secondary | ICD-10-CM | POA: Diagnosis not present

## 2022-05-13 DIAGNOSIS — Z923 Personal history of irradiation: Secondary | ICD-10-CM | POA: Insufficient documentation

## 2022-05-13 DIAGNOSIS — Z08 Encounter for follow-up examination after completed treatment for malignant neoplasm: Secondary | ICD-10-CM

## 2022-05-13 LAB — CBC WITH DIFFERENTIAL/PLATELET
Abs Immature Granulocytes: 0.01 10*3/uL (ref 0.00–0.07)
Basophils Absolute: 0.1 10*3/uL (ref 0.0–0.1)
Basophils Relative: 1 %
Eosinophils Absolute: 0.1 10*3/uL (ref 0.0–0.5)
Eosinophils Relative: 2 %
HCT: 38.7 % — ABNORMAL LOW (ref 39.0–52.0)
Hemoglobin: 12.8 g/dL — ABNORMAL LOW (ref 13.0–17.0)
Immature Granulocytes: 0 %
Lymphocytes Relative: 30 %
Lymphs Abs: 1.1 10*3/uL (ref 0.7–4.0)
MCH: 28.9 pg (ref 26.0–34.0)
MCHC: 33.1 g/dL (ref 30.0–36.0)
MCV: 87.4 fL (ref 80.0–100.0)
Monocytes Absolute: 0.4 10*3/uL (ref 0.1–1.0)
Monocytes Relative: 10 %
Neutro Abs: 2.1 10*3/uL (ref 1.7–7.7)
Neutrophils Relative %: 57 %
Platelets: 201 10*3/uL (ref 150–400)
RBC: 4.43 MIL/uL (ref 4.22–5.81)
RDW: 13.6 % (ref 11.5–15.5)
WBC: 3.8 10*3/uL — ABNORMAL LOW (ref 4.0–10.5)
nRBC: 0 % (ref 0.0–0.2)

## 2022-05-13 LAB — COMPREHENSIVE METABOLIC PANEL
ALT: 19 U/L (ref 0–44)
AST: 20 U/L (ref 15–41)
Albumin: 3.6 g/dL (ref 3.5–5.0)
Alkaline Phosphatase: 81 U/L (ref 38–126)
Anion gap: 8 (ref 5–15)
BUN: 28 mg/dL — ABNORMAL HIGH (ref 8–23)
CO2: 22 mmol/L (ref 22–32)
Calcium: 8.9 mg/dL (ref 8.9–10.3)
Chloride: 106 mmol/L (ref 98–111)
Creatinine, Ser: 1.47 mg/dL — ABNORMAL HIGH (ref 0.61–1.24)
GFR, Estimated: 47 mL/min — ABNORMAL LOW (ref >60)
Glucose, Bld: 178 mg/dL — ABNORMAL HIGH (ref 70–99)
Potassium: 3.9 mmol/L (ref 3.5–5.1)
Sodium: 136 mmol/L (ref 135–145)
Total Bilirubin: 0.5 mg/dL (ref 0.3–1.2)
Total Protein: 7.5 g/dL (ref 6.5–8.1)

## 2022-05-13 NOTE — Telephone Encounter (Signed)
Patient called stating he is scheduled for lab tomorrow at 215 but he needs to be at the CT scan to drink the prep at 1. So, if he can move lab appointment to today at 345. Scheduler notified. Thanks

## 2022-05-14 ENCOUNTER — Ambulatory Visit
Admission: RE | Admit: 2022-05-14 | Discharge: 2022-05-14 | Disposition: A | Discharge status: Home / Self Care | Payer: Medicare PPO | Source: Ambulatory Visit | Attending: Oncology | Admitting: Oncology

## 2022-05-14 ENCOUNTER — Inpatient Hospital Stay: Payer: Medicare PPO

## 2022-05-14 DIAGNOSIS — Z85048 Personal history of other malignant neoplasm of rectum, rectosigmoid junction, and anus: Secondary | ICD-10-CM | POA: Diagnosis present

## 2022-05-14 DIAGNOSIS — Z08 Encounter for follow-up examination after completed treatment for malignant neoplasm: Secondary | ICD-10-CM | POA: Diagnosis present

## 2022-05-14 MED ORDER — IOHEXOL 300 MG/ML  SOLN
100.0000 mL | Freq: Once | INTRAMUSCULAR | Status: AC | PRN
  Administered 2022-05-14: 100 mL via INTRAVENOUS

## 2022-05-15 LAB — CEA: CEA: 0.7 ng/mL (ref 0.0–4.7)

## 2022-05-28 ENCOUNTER — Inpatient Hospital Stay: Payer: Medicare PPO | Admitting: Oncology

## 2022-05-28 ENCOUNTER — Encounter: Payer: Self-pay | Admitting: Oncology

## 2022-05-28 VITALS — BP 124/47 | HR 64 | Temp 95.7°F | Resp 18 | Ht 63.0 in | Wt 171.6 lb

## 2022-05-28 DIAGNOSIS — C20 Malignant neoplasm of rectum: Secondary | ICD-10-CM

## 2022-05-28 DIAGNOSIS — Z08 Encounter for follow-up examination after completed treatment for malignant neoplasm: Secondary | ICD-10-CM

## 2022-05-28 DIAGNOSIS — C19 Malignant neoplasm of rectosigmoid junction: Secondary | ICD-10-CM | POA: Diagnosis not present

## 2022-05-28 DIAGNOSIS — Z85048 Personal history of other malignant neoplasm of rectum, rectosigmoid junction, and anus: Secondary | ICD-10-CM

## 2022-05-28 NOTE — Progress Notes (Signed)
No concerns. 

## 2022-05-28 NOTE — Progress Notes (Signed)
Hematology/Oncology Consult note [facility]  Telephone:([phone] [phone]  Patient Care Team: [name], MD as PCP - General (Internal Medicine) [name], RN as Registered Nurse Noreene Filbert, MD as Radiation Oncologist (Radiation Oncology)   Name of the patient: Willie [name]  [id number]  [birth year]   Date of visit: 05/28/22  Diagnosis- stage IIIb rectal adenocarcinoma cT3 cN2 cM0 s/p total neoadjuvant therapy with chemotherapy and chemoradiation followed by definitive surgery   Chief complaint/ Reason for visit- routine f/u of rectal cancer  Heme/Onc history: patient is a 82 year old male with a past medical history significant for hypertension hyperlipidemia and diabetes among other medical problems.  He was recently found to have iron deficiency anemia which led to a colonoscopy On 07/15/2018.  Colonoscopy showed a villous partially obstructing medium-sized mass in the mid sigmoid colon.  The mass was partially circumferential measuring 5 cm.  There was another infiltrative partially obstructing medium-sized mass found in the rectosigmoid colon which also measured 5 cm.  Pathology from the sigmoid colon mass showed high-grade dysplasia at least involving an adenomatous lesion with villous features.  A more serious process is not excluded.  Rectosigmoid mass biopsy showed invasive colorectal adenocarcinoma.   Patient was referred to Dr. [name] for surgical management.  Patient had a CT abdomen and pelvis with contrast which showed irregular asymmetric mural thickening of the proximal rectum measuring over 6 to 7 cm.  Potential local nodal metastases with several subcentimeter iliac lymph nodes and presacral lymph nodes.  No evidence of metastatic disease outside the pelvis.   Patient also had MRI of the pelvis with and without contrast which showed tumor length 8.5 cm with extension through muscularis propria decreased C.  There is  diffuse involvement of muscularis propria.  No extramural vascular invasion or tumor thrombus.  Shortest distance of any tumor/note from mesorectal fascia 1 to 2 mm.  No involvement of adjacent organs or pelvic sidewall.  Mesorectal lymph nodes within perirectal fat including 6 mm node and a low sigmoid mesocolon node measuring 6 mm.  Another 6 mm node within the low sigmoid mesocolon representing extremity rectal lymphadenopathy.  T3N2 by MRI.  Distance from tumor to internal anal sphincter is 6.2 cm.   Patient completed 8 cycles of neoadjuvant FOLFOX chemotherapy on 12/07/2018.  Interim scans showed similar-appearing irregular wall thickening of the rectum but no retroperitoneal pelvic adenopathy.  Patient completed concurrent chemoradiation end of November 2020.   Patient had partial colectomy with a colostomy at [facility] in March 2021.  Final pathology showed invasive adenocarcinoma 6 cm grade 2.  Tumor deposit present.  Tumor invades through muscularis propria into pericolorectal tissue.  Lymphovascular invasion present.  Perineural invasion absent.  Treatment effect absent.  Extensive residual cancer with no evident tumor regression.  Poor to no response.  Margins negative.  1 out of 39 lymph nodes positive.  yPT3 pN1a  Interval history- he is doing well. Denies any bowel complaints. Denies any issues with colostomy  ECOG PS- 1 Pain scale- 0   Review of systems- Review of Systems  Constitutional:  Negative for chills, fever, malaise/fatigue and weight loss.  HENT:  Negative for congestion, ear discharge and nosebleeds.   Eyes:  Negative for blurred vision.  Respiratory:  Negative for cough, hemoptysis, sputum production, shortness of breath and wheezing.   Cardiovascular:  Negative for chest pain, palpitations, orthopnea and claudication.  Gastrointestinal:  Negative for abdominal pain, blood in stool, constipation, diarrhea, heartburn,  melena, nausea and vomiting.  Genitourinary:  Negative for  dysuria, flank pain, frequency, hematuria and urgency.  Musculoskeletal:  Negative for back pain, joint pain and myalgias.  Skin:  Negative for rash.  Neurological:  Negative for dizziness, tingling, focal weakness, seizures, weakness and headaches.  Endo/Heme/Allergies:  Does not bruise/bleed easily.  Psychiatric/Behavioral:  Negative for depression and suicidal ideas. The patient does not have insomnia.       No Known Allergies   Past Medical History:  Diagnosis Date   Anemia    Coronary artery disease    Diabetes mellitus without complication (HCC)    GERD (gastroesophageal reflux disease)    History of hiatal hernia    Hypercholesterolemia    Hypertension    Prostate cancer ([place]) 2000   Prostatectomy.    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[place]) 09/2018   Chemo tx's.    Wears dentures    partial lower     Past Surgical History:  Procedure Laterality Date   CATARACT EXTRACTION W/PHACO Right 10/12/2019   Procedure: CATARACT EXTRACTION PHACO AND INTRAOCULAR LENS PLACEMENT (IOC) RIGHT DIABETIC 6.35 00:37.1;  Surgeon: Birder Robson, MD;  Location: [facility];  Service: Ophthalmology;  Laterality: Right;   COLONOSCOPY N/A 05/02/2021   Procedure: COLONOSCOPY;  Surgeon: [name], [name], MD;  Location: [facility] ENDOSCOPY;  Service: Gastroenterology;  Laterality: N/A;  IDDM   COLONOSCOPY WITH PROPOFOL N/A 07/15/2018   Procedure: COLONOSCOPY WITH PROPOFOL;  Surgeon: [name], [name], MD;  Location: [facility] ENDOSCOPY;  Service: Gastroenterology;  Laterality: N/A;   ESOPHAGOGASTRODUODENOSCOPY N/A 10/12/2018   Procedure: ESOPHAGOGASTRODUODENOSCOPY (EGD);  Surgeon: [name], MD;  Location[facility] ENDOSCOPY;  Service: Gastroenterology;  Laterality: N/A;   ESOPHAGOGASTRODUODENOSCOPY (EGD) WITH PROPOFOL N/A 07/15/2018   Procedure: ESOPHAGOGASTRODUODENOSCOPY (EGD) WITH PROPOFOL;  Surgeon: [name], [name], MD;  Location: [facility] ENDOSCOPY;  Service: Gastroenterology;  Laterality: N/A;   PORTACATH  PLACEMENT N/A 08/13/2018   Procedure: INSERTION PORT-A-CATH;  Surgeon: [name], MD;  Location: [facility] ORS;  Service: General;  Laterality: N/A;   PROSTATE SURGERY     ROBOT ASSISTED LAPAROSCOPIC PARTIAL COLECTOMY  05/12/2019   [place]   XI ROBOT ASSISTED DIAGNOSTIC LAPAROSCOPY N/A 11/23/2021   Procedure: XI ROBOT ASSISTED DIAGNOSTIC LAPAROSCOPY;  Surgeon: [name], MD;  Location: [facility] ORS;  Service: General;  Laterality: N/A;    Social History   Socioeconomic History   Marital status: Widowed    Spouse name: Not on file   Number of children: 0   Years of education: Not on file   Highest education level: Not on file  Occupational History   Occupation: Pharmacist, hospital    Comment: Retired  Tobacco Use   Smoking status: Never   Smokeless tobacco: Never  Vaping Use   Vaping Use: Never used  Substance and Sexual Activity   Alcohol use: Not Currently   Drug use: Never   Sexual activity: Not Currently  Other Topics Concern   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  His wife was a Camera operator and they traveled frequently.  Several nieces and nephews.  He is the youngest of his brothers and sisters and only surviving.   Social Determinants of Health   Financial Resource Strain: Low Risk  (11/01/2017)   Overall Financial Resource Strain (CARDIA)    Difficulty of Paying Living Expenses: Not hard at all  Food Insecurity: No Food Insecurity (  11/22/2021)   Hunger Vital Sign    Worried About Running Out of Food in the Last Year: Never true    [place] in the Last Year: Never true  Transportation Needs: No Transportation Needs (11/22/2021)   PRAPARE - Hydrologist (Medical): No    Lack of Transportation (Non-Medical): No  Physical Activity: Insufficiently Active (11/01/2017)   Exercise Vital Sign    Days of  Exercise per Week: 2 days    Minutes of Exercise per Session: 30 min  Stress: No Stress Concern Present (11/01/2017)   [place]    Feeling of Stress : Only a little  Social Connections: Unknown (08/31/2018)   Social Connection and Isolation Panel [NHANES]    Frequency of Communication with Friends and Family: More than three times a week    Frequency of Social Gatherings with Friends and Family: More than three times a week    Attends Religious Services: Not on file    Active Member of [place] or Organizations: Not on file    Attends Archivist Meetings: Not on file    Marital Status: Not on file  Intimate Partner Violence: Not At Risk (11/22/2021)   Humiliation, Afraid, Rape, and Kick questionnaire    Fear of Current or Ex-Partner: No    Emotionally Abused: No    Physically Abused: No    Sexually Abused: No    Family History  Problem Relation Age of Onset   Diabetes Mother    Ovarian cancer Sister      Current Outpatient Medications:    aspirin EC 81 MG tablet, Take 81 mg by mouth 3 (three) times a week. Tues weds, thurs, Disp: , Rfl:    brimonidine (ALPHAGAN) 0.2 % ophthalmic solution, Place 1 drop into both eyes 2 (two) times daily., Disp: , Rfl:    lisinopril (ZESTRIL) 10 MG tablet, Take 10 mg by mouth daily., Disp: , Rfl:    metoprolol succinate (TOPROL-XL) 50 MG 24 hr tablet, Take 50 mg by mouth daily., Disp: , Rfl:    Netarsudil-Latanoprost 0.02-0.005 % SOLN, Place 1 drop into both eyes at bedtime., Disp: , Rfl:    NOVOLOG MIX 70/30 FLEXPEN (70-30) 100 UNIT/ML FlexPen, Inject 12 Units into the skin 2 (two) times daily with a meal., Disp: , Rfl:    pantoprazole (PROTONIX) 40 MG tablet, Take 1 tablet (40 mg total) by mouth 2 (two) times daily before a meal. (Patient taking differently: Take 40 mg by mouth daily.), Disp: 60 tablet, Rfl: 0   rosuvastatin (CRESTOR) 10 MG tablet, Take 10 mg by mouth daily.,  Disp: , Rfl:    traZODone (DESYREL) 50 MG tablet, Take 50 mg by mouth at bedtime as needed for sleep. , Disp: , Rfl:  No current facility-administered medications for this visit.  Facility-Administered Medications Ordered in Other Visits:    heparin lock flush 100 unit/mL, 500 Units, Intravenous, Once, Sindy Guadeloupe, MD   sodium chloride flush (NS) 0.9 % injection 10 mL, 10 mL, Intravenous, Once, Sindy Guadeloupe, MD   sodium chloride flush (NS) 0.9 % injection 10 mL, 10 mL, Intravenous, Once, Sindy Guadeloupe, MD   sodium chloride flush (NS) 0.9 % injection 10 mL, 10 mL, Intravenous, PRN, Sindy Guadeloupe, MD  Physical exam:  Vitals:   05/28/22 1352  BP: (!) 124/47  Pulse: 64  Resp: 18  Temp: (!) 95.7 F (35.4 C)  TempSrc: Tympanic  SpO2: 100%  Weight: 171 lb 9.6 oz (77.8 kg)  Height: 5\' 3"  (1.6 m)   Physical Exam Cardiovascular:     Rate and Rhythm: Normal rate and regular rhythm.     Heart sounds: Normal heart sounds.  Pulmonary:     Effort: Pulmonary effort is normal.     Breath sounds: Normal breath sounds.  Abdominal:     General: Bowel sounds are normal.     Palpations: Abdomen is soft.     Comments: Ileostomy in place functioning well  Skin:    General: Skin is warm and dry.  Neurological:     Mental Status: He is alert and oriented to person, place, and time.         Latest Ref Rng & Units 05/13/2022    3:42 PM  CMP  Glucose 70 - 99 mg/dL 178   BUN 8 - 23 mg/dL 28   Creatinine 0.61 - 1.24 mg/dL 1.47   Sodium 135 - 145 mmol/L 136   Potassium 3.5 - 5.1 mmol/L 3.9   Chloride 98 - 111 mmol/L 106   CO2 22 - 32 mmol/L 22   Calcium 8.9 - 10.3 mg/dL 8.9   Total Protein 6.5 - 8.1 g/dL 7.5   Total Bilirubin 0.3 - 1.2 mg/dL 0.5   Alkaline Phos 38 - 126 U/L 81   AST 15 - 41 U/L 20   ALT 0 - 44 U/L 19       Latest Ref Rng & Units 05/13/2022    3:42 PM  CBC  WBC 4.0 - 10.5 K/uL 3.8   Hemoglobin 13.0 - 17.0 g/dL 12.8   Hematocrit 39.0 - 52.0 % 38.7   Platelets 150 -  400 K/uL 201     No images are attached to the encounter.  CT CHEST ABDOMEN PELVIS W CONTRAST  Result Date: 05/15/2022 CLINICAL DATA:  Rectal carcinoma. Recent surgery for lysis of adhesions. Rectal carcinoma surveillance. * Tracking Code: BO * is EXAM: CT CHEST, ABDOMEN, AND PELVIS WITH CONTRAST TECHNIQUE: Multidetector CT imaging of the chest, abdomen and pelvis was performed following the standard protocol during bolus administration of intravenous contrast. RADIATION DOSE REDUCTION: This exam was performed according to the departmental dose-optimization program which includes automated exposure control, adjustment of the mA and/or kV according to patient size and/or use of iterative reconstruction technique. CONTRAST:  156mL OMNIPAQUE IOHEXOL 300 MG/ML  SOLN COMPARISON:  None Available. FINDINGS: CT CHEST FINDINGS Cardiovascular: No significant vascular findings. Normal heart size. No pericardial effusion. Mediastinum/Nodes: No axillary or supraclavicular adenopathy. No mediastinal or hilar adenopathy. No pericardial fluid. Esophagus normal. Lungs/Pleura: No suspicious pulmonary nodules. Normal pleural. Airways normal. Musculoskeletal: No aggressive osseous lesion. CT ABDOMEN AND PELVIS FINDINGS Hepatobiliary: No focal hepatic lesion. No biliary ductal dilatation. Gallbladder is normal. Common bile duct is normal. Pancreas: Pancreas is normal. No ductal dilatation. No pancreatic inflammation. Spleen: Normal spleen. No mesenteric lymphadenopathy.  No pelvic lymphadenopathy. Adrenals/urinary tract: Adrenal glands and kidneys are normal. The ureters and bladder normal. Stomach/Bowel: .Stomach, duodenum and small bowel normal. Appendix normal. Diverticula ascending colon transverse colon. LEFT abdominal wall colostomy without obstruction. Hartmann's pouch noted. Contrast in the excluded rectum. No suspicious nodularity. Previous described nodularity along the distal most transverse colon adjacent to the ostomy  is no longer identified. Vascular/Lymphatic: Abdominal aorta is normal caliber. There is no retroperitoneal or periportal lymphadenopathy. No pelvic lymphadenopathy. Reproductive: Prostatectomy. Other: No peritoneal metastasis. Musculoskeletal: Degenerative cystic change in LEFT and RIGHT acetabulum.  Schmorl's nodes noted endplates L3 and L4. No aggressive osseous lesion. IMPRESSION: CHEST IMPRESSION: No evidence of thoracic metastasis. PELVIS IMPRESSION: 1. No evidence of local colorectal carcinoma recurrence. 2. No evidence of metastatic disease in the abdomen pelvis. 3. LEFT abdominal wall colostomy without complication. 4. No bowel obstruction. Electronically Signed   By: Suzy Bouchard M.D.   On: 05/15/2022 15:44     Assessment and plan- Patient is a 82 y.o. male here for routine surveillance visit for rectal cancer  I have reviewed ct chest abdomen pelvis images independently and discussed findings with the patient. Scans do not show any evidence of recurrent or progressive disease. He remains in remission.   I will see you in 6 months with labs and scans   Visit Diagnosis 1. Encounter for follow-up surveillance of rectal cancer      Dr. Randa Evens, MD, MPH [facility] [id number] 05/28/2022 2:01 PM

## 2022-05-30 ENCOUNTER — Inpatient Hospital Stay
Admission: EM | Admit: 2022-05-30 | Discharge: 2022-06-02 | DRG: 389 | Disposition: A | Discharge status: Home / Self Care | Payer: Medicare PPO | Attending: Internal Medicine | Admitting: Internal Medicine

## 2022-05-30 ENCOUNTER — Emergency Department: Payer: Medicare PPO

## 2022-05-30 DIAGNOSIS — I1310 Hypertensive heart and chronic kidney disease without heart failure, with stage 1 through stage 4 chronic kidney disease, or unspecified chronic kidney disease: Secondary | ICD-10-CM | POA: Diagnosis present

## 2022-05-30 DIAGNOSIS — R112 Nausea with vomiting, unspecified: Secondary | ICD-10-CM | POA: Diagnosis not present

## 2022-05-30 DIAGNOSIS — Z933 Colostomy status: Secondary | ICD-10-CM

## 2022-05-30 DIAGNOSIS — K56609 Unspecified intestinal obstruction, unspecified as to partial versus complete obstruction: Secondary | ICD-10-CM | POA: Diagnosis not present

## 2022-05-30 DIAGNOSIS — R109 Unspecified abdominal pain: Principal | ICD-10-CM

## 2022-05-30 DIAGNOSIS — K56600 Partial intestinal obstruction, unspecified as to cause: Principal | ICD-10-CM | POA: Diagnosis present

## 2022-05-30 DIAGNOSIS — K219 Gastro-esophageal reflux disease without esophagitis: Secondary | ICD-10-CM | POA: Diagnosis present

## 2022-05-30 DIAGNOSIS — Z9221 Personal history of antineoplastic chemotherapy: Secondary | ICD-10-CM

## 2022-05-30 DIAGNOSIS — Z9079 Acquired absence of other genital organ(s): Secondary | ICD-10-CM

## 2022-05-30 DIAGNOSIS — I10 Essential (primary) hypertension: Secondary | ICD-10-CM | POA: Diagnosis present

## 2022-05-30 DIAGNOSIS — Z7982 Long term (current) use of aspirin: Secondary | ICD-10-CM

## 2022-05-30 DIAGNOSIS — Z85048 Personal history of other malignant neoplasm of rectum, rectosigmoid junction, and anus: Secondary | ICD-10-CM

## 2022-05-30 DIAGNOSIS — Z8041 Family history of malignant neoplasm of ovary: Secondary | ICD-10-CM

## 2022-05-30 DIAGNOSIS — Z9049 Acquired absence of other specified parts of digestive tract: Secondary | ICD-10-CM

## 2022-05-30 DIAGNOSIS — E872 Acidosis, unspecified: Secondary | ICD-10-CM | POA: Diagnosis present

## 2022-05-30 DIAGNOSIS — I2510 Atherosclerotic heart disease of native coronary artery without angina pectoris: Secondary | ICD-10-CM | POA: Diagnosis present

## 2022-05-30 DIAGNOSIS — Z79899 Other long term (current) drug therapy: Secondary | ICD-10-CM

## 2022-05-30 DIAGNOSIS — E1122 Type 2 diabetes mellitus with diabetic chronic kidney disease: Secondary | ICD-10-CM | POA: Diagnosis present

## 2022-05-30 DIAGNOSIS — E7800 Pure hypercholesterolemia, unspecified: Secondary | ICD-10-CM | POA: Diagnosis present

## 2022-05-30 DIAGNOSIS — Z8546 Personal history of malignant neoplasm of prostate: Secondary | ICD-10-CM

## 2022-05-30 DIAGNOSIS — Z833 Family history of diabetes mellitus: Secondary | ICD-10-CM

## 2022-05-30 DIAGNOSIS — N1831 Chronic kidney disease, stage 3a: Secondary | ICD-10-CM | POA: Diagnosis present

## 2022-05-30 HISTORY — DX: Unspecified intestinal obstruction, unspecified as to partial versus complete obstruction: K56.609

## 2022-05-30 LAB — COMPREHENSIVE METABOLIC PANEL
ALT: 27 U/L (ref 0–44)
AST: 34 U/L (ref 15–41)
Albumin: 3.9 g/dL (ref 3.5–5.0)
Alkaline Phosphatase: 94 U/L (ref 38–126)
Anion gap: 16 — ABNORMAL HIGH (ref 5–15)
BUN: 17 mg/dL (ref 8–23)
CO2: 19 mmol/L — ABNORMAL LOW (ref 22–32)
Calcium: 9.7 mg/dL (ref 8.9–10.3)
Chloride: 101 mmol/L (ref 98–111)
Creatinine, Ser: 1.27 mg/dL — ABNORMAL HIGH (ref 0.61–1.24)
GFR, Estimated: 56 mL/min — ABNORMAL LOW (ref >60)
Glucose, Bld: 160 mg/dL — ABNORMAL HIGH (ref 70–99)
Potassium: 4.7 mmol/L (ref 3.5–5.1)
Sodium: 136 mmol/L (ref 135–145)
Total Bilirubin: 1 mg/dL (ref 0.3–1.2)
Total Protein: 8.4 g/dL — ABNORMAL HIGH (ref 6.5–8.1)

## 2022-05-30 LAB — LIPASE, BLOOD: Lipase: 25 U/L (ref 11–51)

## 2022-05-30 LAB — LACTIC ACID, PLASMA
Lactic Acid, Venous: 3.1 mmol/L (ref 0.5–1.9)
Lactic Acid, Venous: 2.8 mmol/L (ref 0.5–1.9)

## 2022-05-30 LAB — CBC
HCT: 47.2 % (ref 39.0–52.0)
Hemoglobin: 15.9 g/dL (ref 13.0–17.0)
MCH: 28.9 pg (ref 26.0–34.0)
MCHC: 33.7 g/dL (ref 30.0–36.0)
MCV: 85.7 fL (ref 80.0–100.0)
Platelets: 241 10*3/uL (ref 150–400)
RBC: 5.51 MIL/uL (ref 4.22–5.81)
RDW: 13.6 % (ref 11.5–15.5)
WBC: 7.6 10*3/uL (ref 4.0–10.5)
nRBC: 0 % (ref 0.0–0.2)

## 2022-05-30 LAB — CBG MONITORING, ED: Glucose-Capillary: 148 mg/dL — ABNORMAL HIGH (ref 70–99)

## 2022-05-30 MED ORDER — ONDANSETRON HCL 4 MG/2ML IJ SOLN
4.0000 mg | Freq: Once | INTRAMUSCULAR | Status: AC
  Administered 2022-05-30: 4 mg via INTRAVENOUS
  Filled 2022-05-30: qty 2

## 2022-05-30 MED ORDER — SODIUM CHLORIDE 0.9 % IV BOLUS
1000.0000 mL | Freq: Once | INTRAVENOUS | Status: AC
  Administered 2022-05-30: 1000 mL via INTRAVENOUS

## 2022-05-30 MED ORDER — IOHEXOL 300 MG/ML  SOLN
100.0000 mL | Freq: Once | INTRAMUSCULAR | Status: AC | PRN
  Administered 2022-05-30: 100 mL via INTRAVENOUS

## 2022-05-30 MED ORDER — FENTANYL CITRATE PF 50 MCG/ML IJ SOSY
50.0000 ug | PREFILLED_SYRINGE | Freq: Once | INTRAMUSCULAR | Status: AC
  Administered 2022-05-30: 50 ug via INTRAVENOUS
  Filled 2022-05-30: qty 1

## 2022-05-30 MED ORDER — IOHEXOL 300 MG/ML  SOLN: 100.0000 mL | Freq: Once | INTRAMUSCULAR | Status: DC | PRN

## 2022-05-30 MED ORDER — ONDANSETRON 4 MG PO TBDP: 4.0000 mg | ORAL_TABLET | Freq: Once | ORAL | Status: AC

## 2022-05-30 MED ORDER — ONDANSETRON 4 MG PO TBDP
ORAL_TABLET | ORAL | Status: AC
  Administered 2022-05-30: 4 mg via ORAL
  Filled 2022-05-30: qty 1

## 2022-05-30 MED ORDER — LACTATED RINGERS IV SOLN: INTRAVENOUS | Status: DC

## 2022-05-30 MED ORDER — MORPHINE SULFATE (PF) 4 MG/ML IV SOLN
4.0000 mg | Freq: Once | INTRAVENOUS | Status: AC
  Administered 2022-05-30: 4 mg via INTRAVENOUS
  Filled 2022-05-30: qty 1

## 2022-05-30 NOTE — ED Triage Notes (Signed)
Pt here with sob, BIB [facility] for N/V and abd pain that started today. Pt has al colostomy bag, son reports empty it this morning and pt has not had any stool in his ostomy bag all day which is unusual. Pt reports abd feels tighter then normal and feels bloated. HX of bowl obstruction in the past. HTN noted, pt has vomited while in triage. A&Ox4.

## 2022-05-30 NOTE — ED Notes (Signed)
Date and time results received: 05/30/22 2200 (use smartphrase ".now" to insert current time)  Test: lactic acid Critical Value: 3.1  Name of Provider Notified: Dr. Kerman Passey  Orders Received? Or Actions Taken?: awaiting orders

## 2022-05-30 NOTE — ED Provider Notes (Signed)
[facility] Provider Note    Event Date/Time   First MD Initiated Contact with Patient 05/30/22 2109     (approximate)   History   N/V Abd Pain   HPI  Willie [name] is a 82 y.o. male w/ PMH of colon CA with colectomy now in remission, history of prostate cancer, history of small bowel obstructions, who presents to the emergency department for not feeling well.  This morning started not feeling well with abdominal pain and nausea and vomiting.  No bowel movement.  Last time he had output in his colostomy was yesterday.  Endorses ongoing nausea and vomiting.  Denies chest pain or shortness of breath.  Denies any dysuria.     Physical Exam   Triage Vital Signs: ED Triage Vitals  Enc Vitals Group     BP 05/30/22 2049 (!) 172/106     Pulse Rate 05/30/22 2049 (!) 109     Resp 05/30/22 2049 20     Temp 05/30/22 2049 98.4 F (36.9 C)     Temp src --      SpO2 05/30/22 2049 98 %     Weight 05/30/22 2055 171 lb 9.6 oz (77.8 kg)     Height 05/30/22 2055 5\' 3"  (1.6 m)     Head Circumference --      Peak Flow --      Pain Score 05/30/22 2055 8     Pain Loc --      Pain Edu? --      Excl. in [place]? --     Most recent vital signs: Vitals:   05/30/22 2049 05/30/22 2123  BP: (!) 172/106 (!) 167/103  Pulse: (!) 109 (!) 114  Resp: 20 19  Temp: 98.4 F (36.9 C)   SpO2: 98% 99%    Physical Exam Constitutional:      General: He is in acute distress.     Appearance: He is well-developed.  HENT:     Head: Atraumatic.  Eyes:     Conjunctiva/sclera: Conjunctivae normal.  Cardiovascular:     Rate and Rhythm: Regular rhythm. Tachycardia present.  Pulmonary:     Effort: No respiratory distress.  Abdominal:     General: There is distension.     Comments: Colostomy with no output in the bag, abdominal distention.  Diffuse abdominal tenderness to palpation with no rebound or guarding.  Musculoskeletal:     Cervical back: Normal range of motion.  Skin:     General: Skin is warm.  Neurological:     Mental Status: He is alert. Mental status is at baseline.     IMPRESSION / MDM / ASSESSMENT AND PLAN / ED COURSE  I reviewed the triage vital signs and the nursing notes.  On chart review patient has a history of colon cancer.  Now in remission.  No recent chemotherapy since 2020.  Differential diagnosis including bowel obstruction, intra-abdominal infectious process, electrolyte abnormality, dehydration    RADIOLOGY I independently reviewed imaging, my interpretation of imaging: CT abdomen and pelvis with contrast - concern for SBO.  Read as concern for closed loop obstruction.   LABS (all labs ordered are listed, but only abnormal results are displayed) Labs interpreted as -    Labs Reviewed  COMPREHENSIVE METABOLIC PANEL - Abnormal; Notable for the following components:      Result Value   CO2 19 (*)    Glucose, Bld 160 (*)    Creatinine, Ser 1.27 (*)  Total Protein 8.4 (*)    GFR, Estimated 56 (*)    Anion gap 16 (*)    All other components within normal limits  LACTIC ACID, PLASMA - Abnormal; Notable for the following components:   Lactic Acid, Venous 3.1 (*)    All other components within normal limits  CBG MONITORING, ED - Abnormal; Notable for the following components:   Glucose-Capillary 148 (*)    All other components within normal limits  CBC  LIPASE, BLOOD  LACTIC ACID, PLASMA    TREATMENT  1 L of IV fluids, IV Zofran, IV fentanyl, IV morphine   MDM  LA elevated.  Given IVF and IV pain medication.  CT with findings of SBO with concern for closed loop obstruction.     Discussed with Surgery, Dr. Hampton Abbot, recommended NG tube and he will come in to the emergency department for evaluation.  Patient NPO.   PROCEDURES:  Critical Care performed: yes  .Critical Care  Performed by: Nathaniel Man, MD Authorized by: Nathaniel Man, MD   Critical care provider statement:    Critical care time (minutes):   30   Critical care time was exclusive of:  Separately billable procedures and treating other patients   Critical care was necessary to treat or prevent imminent or life-threatening deterioration of the following conditions: acute abdomen.   Critical care was time spent personally by me on the following activities:  Development of treatment plan with patient or surrogate, discussions with consultants, evaluation of patient's response to treatment, examination of patient, ordering and review of laboratory studies, ordering and review of radiographic studies, ordering and performing treatments and interventions, pulse oximetry, re-evaluation of patient's condition and review of old charts   Patient's presentation is most consistent with acute presentation with potential threat to life or bodily function.   MEDICATIONS ORDERED IN ED: Medications  lactated ringers infusion (has no administration in time range)  ondansetron (ZOFRAN-ODT) disintegrating tablet 4 mg (4 mg Oral Given 05/30/22 2047)  sodium chloride 0.9 % bolus 1,000 mL (1,000 mLs Intravenous New Bag/Given 05/30/22 2107)  fentaNYL (SUBLIMAZE) injection 50 mcg (50 mcg Intravenous Given 05/30/22 2108)  ondansetron (ZOFRAN) injection 4 mg (4 mg Intravenous Given 05/30/22 2130)  iohexol (OMNIPAQUE) 300 MG/ML solution 100 mL (100 mLs Intravenous Contrast Given 05/30/22 2200)  morphine (PF) 4 MG/ML injection 4 mg (4 mg Intravenous Given 05/30/22 2321)    FINAL CLINICAL IMPRESSION(S) / ED DIAGNOSES   Final diagnoses:  Abdominal pain, unspecified abdominal location  SBO (small bowel obstruction) ([place])     Rx / DC Orders   ED Discharge Orders     None        Note:  This document was prepared using Dragon voice recognition software and may include unintentional dictation errors.   Nathaniel Man, MD 05/30/22 [phone]

## 2022-05-31 ENCOUNTER — Inpatient Hospital Stay: Payer: Medicare PPO

## 2022-05-31 ENCOUNTER — Other Ambulatory Visit: Payer: Self-pay

## 2022-05-31 DIAGNOSIS — E872 Acidosis, unspecified: Secondary | ICD-10-CM | POA: Diagnosis present

## 2022-05-31 DIAGNOSIS — I1310 Hypertensive heart and chronic kidney disease without heart failure, with stage 1 through stage 4 chronic kidney disease, or unspecified chronic kidney disease: Secondary | ICD-10-CM | POA: Diagnosis present

## 2022-05-31 DIAGNOSIS — R112 Nausea with vomiting, unspecified: Secondary | ICD-10-CM | POA: Diagnosis present

## 2022-05-31 DIAGNOSIS — Z8546 Personal history of malignant neoplasm of prostate: Secondary | ICD-10-CM | POA: Diagnosis not present

## 2022-05-31 DIAGNOSIS — K56609 Unspecified intestinal obstruction, unspecified as to partial versus complete obstruction: Secondary | ICD-10-CM | POA: Diagnosis present

## 2022-05-31 DIAGNOSIS — Z9049 Acquired absence of other specified parts of digestive tract: Secondary | ICD-10-CM | POA: Diagnosis not present

## 2022-05-31 DIAGNOSIS — I2510 Atherosclerotic heart disease of native coronary artery without angina pectoris: Secondary | ICD-10-CM | POA: Diagnosis present

## 2022-05-31 DIAGNOSIS — Z8041 Family history of malignant neoplasm of ovary: Secondary | ICD-10-CM | POA: Diagnosis not present

## 2022-05-31 DIAGNOSIS — E7800 Pure hypercholesterolemia, unspecified: Secondary | ICD-10-CM | POA: Diagnosis present

## 2022-05-31 DIAGNOSIS — Z933 Colostomy status: Secondary | ICD-10-CM | POA: Diagnosis not present

## 2022-05-31 DIAGNOSIS — Z9221 Personal history of antineoplastic chemotherapy: Secondary | ICD-10-CM | POA: Diagnosis not present

## 2022-05-31 DIAGNOSIS — K219 Gastro-esophageal reflux disease without esophagitis: Secondary | ICD-10-CM | POA: Diagnosis present

## 2022-05-31 DIAGNOSIS — Z7982 Long term (current) use of aspirin: Secondary | ICD-10-CM | POA: Diagnosis not present

## 2022-05-31 DIAGNOSIS — N1831 Chronic kidney disease, stage 3a: Secondary | ICD-10-CM

## 2022-05-31 DIAGNOSIS — E1122 Type 2 diabetes mellitus with diabetic chronic kidney disease: Secondary | ICD-10-CM | POA: Diagnosis present

## 2022-05-31 DIAGNOSIS — I10 Essential (primary) hypertension: Secondary | ICD-10-CM | POA: Diagnosis not present

## 2022-05-31 DIAGNOSIS — Z9079 Acquired absence of other genital organ(s): Secondary | ICD-10-CM | POA: Diagnosis not present

## 2022-05-31 DIAGNOSIS — Z79899 Other long term (current) drug therapy: Secondary | ICD-10-CM | POA: Diagnosis not present

## 2022-05-31 DIAGNOSIS — Z85048 Personal history of other malignant neoplasm of rectum, rectosigmoid junction, and anus: Secondary | ICD-10-CM | POA: Diagnosis not present

## 2022-05-31 DIAGNOSIS — K56600 Partial intestinal obstruction, unspecified as to cause: Secondary | ICD-10-CM | POA: Diagnosis present

## 2022-05-31 DIAGNOSIS — Z833 Family history of diabetes mellitus: Secondary | ICD-10-CM | POA: Diagnosis not present

## 2022-05-31 LAB — PROTIME-INR
INR: 1.1 (ref 0.8–1.2)
Prothrombin Time: 13.6 seconds (ref 11.4–15.2)

## 2022-05-31 LAB — CBC
HCT: 42.5 % (ref 39.0–52.0)
Hemoglobin: 14.2 g/dL (ref 13.0–17.0)
MCH: 28.8 pg (ref 26.0–34.0)
MCHC: 33.4 g/dL (ref 30.0–36.0)
MCV: 86.2 fL (ref 80.0–100.0)
Platelets: 204 10*3/uL (ref 150–400)
RBC: 4.93 MIL/uL (ref 4.22–5.81)
RDW: 13.8 % (ref 11.5–15.5)
WBC: 5.6 10*3/uL (ref 4.0–10.5)
nRBC: 0 % (ref 0.0–0.2)

## 2022-05-31 LAB — GLUCOSE, CAPILLARY
Glucose-Capillary: 113 mg/dL — ABNORMAL HIGH (ref 70–99)
Glucose-Capillary: 115 mg/dL — ABNORMAL HIGH (ref 70–99)
Glucose-Capillary: 122 mg/dL — ABNORMAL HIGH (ref 70–99)
Glucose-Capillary: 128 mg/dL — ABNORMAL HIGH (ref 70–99)
Glucose-Capillary: 134 mg/dL — ABNORMAL HIGH (ref 70–99)
Glucose-Capillary: 153 mg/dL — ABNORMAL HIGH (ref 70–99)

## 2022-05-31 LAB — BASIC METABOLIC PANEL
Anion gap: 14 (ref 5–15)
BUN: 17 mg/dL (ref 8–23)
CO2: 24 mmol/L (ref 22–32)
Calcium: 9.3 mg/dL (ref 8.9–10.3)
Chloride: 104 mmol/L (ref 98–111)
Creatinine, Ser: 1.26 mg/dL — ABNORMAL HIGH (ref 0.61–1.24)
GFR, Estimated: 57 mL/min — ABNORMAL LOW (ref >60)
Glucose, Bld: 149 mg/dL — ABNORMAL HIGH (ref 70–99)
Potassium: 4.3 mmol/L (ref 3.5–5.1)
Sodium: 142 mmol/L (ref 135–145)

## 2022-05-31 LAB — APTT: aPTT: 25 seconds (ref 24–36)

## 2022-05-31 LAB — LACTIC ACID, PLASMA: Lactic Acid, Venous: 1 mmol/L (ref 0.5–1.9)

## 2022-05-31 MED ORDER — INSULIN ASPART 100 UNIT/ML IJ SOLN
0.0000 [IU] | INTRAMUSCULAR | Status: DC
  Administered 2022-05-31: 1 [IU] via SUBCUTANEOUS
  Administered 2022-05-31 – 2022-06-01 (×2): 2 [IU] via SUBCUTANEOUS
  Administered 2022-06-01: 3 [IU] via SUBCUTANEOUS
  Administered 2022-06-01 – 2022-06-02 (×3): 1 [IU] via SUBCUTANEOUS
  Administered 2022-06-02: 2 [IU] via SUBCUTANEOUS
  Filled 2022-05-31 (×8): qty 1

## 2022-05-31 MED ORDER — PANTOPRAZOLE SODIUM 40 MG IV SOLR
40.0000 mg | Freq: Every day | INTRAVENOUS | Status: DC
  Administered 2022-05-31 – 2022-06-02 (×3): 40 mg via INTRAVENOUS
  Filled 2022-05-31 (×4): qty 10

## 2022-05-31 MED ORDER — METOPROLOL SUCCINATE ER 50 MG PO TB24
50.0000 mg | ORAL_TABLET | Freq: Every day | ORAL | Status: DC
  Administered 2022-05-31 – 2022-06-02 (×3): 50 mg via ORAL
  Filled 2022-05-31 (×3): qty 1

## 2022-05-31 MED ORDER — SODIUM CHLORIDE 0.9 % IV SOLN
3.0000 g | Freq: Four times a day (QID) | INTRAVENOUS | Status: DC
  Administered 2022-05-31 (×2): 3 g via INTRAVENOUS
  Filled 2022-05-31 (×3): qty 8

## 2022-05-31 MED ORDER — ASPIRIN 81 MG PO TBEC: 81.0000 mg | DELAYED_RELEASE_TABLET | ORAL | Status: DC

## 2022-05-31 MED ORDER — LISINOPRIL 10 MG PO TABS
10.0000 mg | ORAL_TABLET | Freq: Every day | ORAL | Status: DC
  Administered 2022-05-31 – 2022-06-02 (×3): 10 mg via ORAL
  Filled 2022-05-31 (×3): qty 1

## 2022-05-31 MED ORDER — TRAZODONE HCL 50 MG PO TABS: 50.0000 mg | ORAL_TABLET | Freq: Every evening | ORAL | Status: DC | PRN

## 2022-05-31 MED ORDER — ROSUVASTATIN CALCIUM 10 MG PO TABS
10.0000 mg | ORAL_TABLET | Freq: Every day | ORAL | Status: DC
  Administered 2022-05-31 – 2022-06-02 (×3): 10 mg via ORAL
  Filled 2022-05-31 (×3): qty 1

## 2022-05-31 MED ORDER — SODIUM CHLORIDE 0.9% FLUSH
3.0000 mL | Freq: Two times a day (BID) | INTRAVENOUS | Status: DC
  Administered 2022-05-31 – 2022-06-02 (×3): 3 mL via INTRAVENOUS

## 2022-05-31 MED ORDER — ENOXAPARIN SODIUM 40 MG/0.4ML IJ SOSY
40.0000 mg | PREFILLED_SYRINGE | INTRAMUSCULAR | Status: DC
  Administered 2022-05-31 – 2022-06-02 (×3): 40 mg via SUBCUTANEOUS
  Filled 2022-05-31 (×3): qty 0.4

## 2022-05-31 MED ORDER — LABETALOL HCL 5 MG/ML IV SOLN
20.0000 mg | INTRAVENOUS | Status: DC | PRN
  Administered 2022-05-31 – 2022-06-01 (×2): 20 mg via INTRAVENOUS
  Filled 2022-05-31 (×3): qty 4

## 2022-05-31 MED ORDER — MORPHINE SULFATE (PF) 4 MG/ML IV SOLN
3.0000 mg | INTRAVENOUS | Status: DC | PRN
  Administered 2022-05-31 – 2022-06-01 (×3): 3 mg via INTRAVENOUS
  Filled 2022-05-31 (×3): qty 1

## 2022-05-31 MED ORDER — DIATRIZOATE MEGLUMINE & SODIUM 66-10 % PO SOLN
90.0000 mL | Freq: Once | ORAL | Status: AC
  Administered 2022-05-31: 90 mL via NASOGASTRIC

## 2022-05-31 NOTE — Progress Notes (Signed)
Pharmacy Antibiotic Note  Willie [name] is a 82 y.o. male admitted on 05/30/2022 with intra-abdominal infection.  Pharmacy has been consulted for Unasyn dosing.  Plan: Unasyn 3 gm q6hr per indication & renal fxn.  Pharmacy will continue to follow and will adjust abx dosing whenever warranted.  Temp (24hrs), Avg:98.3 F (36.8 C), Min:98.2 F (36.8 C), Max:98.4 F (36.9 C)   Recent Labs  Lab 05/30/22 2106 05/30/22 2256  WBC 7.6  --   CREATININE 1.27*  --   LATICACIDVEN 3.1* 2.8*    Estimated Creatinine Clearance: 40.7 mL/min (A) (by C-G formula based on SCr of 1.27 mg/dL (H)).    No Known Allergies  Antimicrobials this admission: 3/22 Unasyn >>   Microbiology results: No lab cx currently ordered or pending at this time.  Thank you for allowing pharmacy to be a part of this patient's care.  Renda Rolls, PharmD, [facility] 05/31/2022 2:17 AM

## 2022-05-31 NOTE — Assessment & Plan Note (Signed)
Patient is appearing nontoxic no white count no fever.  Mild lactic acidosis is noted which is trending down.  At this time maintain patient on nasogastric tube suction to low intermittent.  Discussed with RN.  Pain control as needed, patient is actually not reporting any pain at this time.  IV fluids.  Follow-up surgical evaluation.

## 2022-05-31 NOTE — Assessment & Plan Note (Signed)
Mild trednign down. Iv fluids.

## 2022-05-31 NOTE — H&P (Signed)
History and Physical    Patient: Willie [name] [id number] DOB: [birth year] DOA: 05/30/2022 DOS: the patient was seen and examined on 05/31/2022 PCP: [name], MD  Patient coming from: Home  Chief Complaint:  Chief Complaint  Patient presents with   N/V Abd Pain   HPI: Willie [name] is a 82 y.o. male with medical history significant of colon surgery with stoma creation for colon cancer in the past.  Patient has had prior episodes of bowel obstruction.  With prior lysis of adhesions.  Patient was in his usual state of health till earlier this a.m. when he reports a new onset of nausea with associated vomiting at least 3 episodes which were nonbloody, several other smaller episodes of vomiting.  Associated with abdominal distention.  Patient does not report any abdominal pain.  No fever.  No diarrhea.  Patient is actually not passed gas or bowel movement since this morning.  No chest pain or shortness of breath no presyncope no palpitations.  Patient has had prior similar symptoms in the past that were diagnosed as bowel obstruction, patient presented to the ER for further evaluation.  ER course is notable for patient having nasogastric tube placement followed by low intermittent suction, patient is feeling pretty good at this time. Review of Systems: As mentioned in the history of present illness. All other systems reviewed and are negative. Past Medical History:  Diagnosis Date   Anemia    Bowel obstruction (HCC)    Coronary artery disease    Diabetes mellitus without complication (HCC)    GERD (gastroesophageal reflux disease)    History of hiatal hernia    Hypercholesterolemia    Hypertension    Prostate cancer ([place]) 2000   Prostatectomy.    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[place]) 09/2018   Chemo tx's.    Wears dentures    partial lower   Past Surgical History:  Procedure Laterality Date   CATARACT EXTRACTION W/PHACO Right 10/12/2019   Procedure: CATARACT EXTRACTION PHACO AND  INTRAOCULAR LENS PLACEMENT (IOC) RIGHT DIABETIC 6.35 00:37.1;  Surgeon: Birder Robson, MD;  Location: [facility];  Service: Ophthalmology;  Laterality: Right;   COLONOSCOPY N/A 05/02/2021   Procedure: COLONOSCOPY;  Surgeon: [name], [name], MD;  Location: [facility] ENDOSCOPY;  Service: Gastroenterology;  Laterality: N/A;  IDDM   COLONOSCOPY WITH PROPOFOL N/A 07/15/2018   Procedure: COLONOSCOPY WITH PROPOFOL;  Surgeon: [name], [name], MD;  Location: [facility] ENDOSCOPY;  Service: Gastroenterology;  Laterality: N/A;   COLOSTOMY Left    ESOPHAGOGASTRODUODENOSCOPY N/A 10/12/2018   Procedure: ESOPHAGOGASTRODUODENOSCOPY (EGD);  Surgeon: [name], MD;  Location: [facility]COPY;  Service: Gastroenterology;  Laterality: N/A;   ESOPHAGOGASTRODUODENOSCOPY (EGD) WITH PROPOFOL N/A 07/15/2018   Procedure: ESOPHAGOGASTRODUODENOSCOPY (EGD) WITH PROPOFOL;  Surgeon: [name], [name], MD;  Location: [facility] ENDOSCOPY;  Service: Gastroenterology;  Laterality: N/A;   PORTACATH PLACEMENT N/A 08/13/2018   Procedure: INSERTION PORT-A-CATH;  Surgeon: [name], MD;  Location: [facility] ORS;  Service: General;  Laterality: N/A;   PROSTATE SURGERY     ROBOT ASSISTED LAPAROSCOPIC PARTIAL COLECTOMY  05/12/2019   [place]   XI ROBOT ASSISTED DIAGNOSTIC LAPAROSCOPY N/A 11/23/2021   Procedure: XI ROBOT ASSISTED DIAGNOSTIC LAPAROSCOPY;  Surgeon: [name], MD;  Location: [facility] ORS;  Service: General;  Laterality: N/A;   Social History:  reports that he has never smoked. He has never used smokeless tobacco. He reports that he does not currently use alcohol. He reports that he does not use drugs.  No Known Allergies  Family History  Problem Relation Age of Onset   Diabetes Mother    Ovarian cancer Sister     Prior to Admission medications   Medication Sig Start Date End Date Taking? Authorizing Provider  aspirin EC 81 MG tablet Take 81 mg by mouth 3 (three) times a week. Tues weds, thurs    Yes [provider]  brimonidine (ALPHAGAN) 0.2 % ophthalmic solution Place 1 drop into both eyes 2 (two) times daily.   Yes [provider]  lisinopril (ZESTRIL) 10 MG tablet Take 10 mg by mouth daily.   Yes [provider]  metoprolol succinate (TOPROL-XL) 50 MG 24 hr tablet Take 50 mg by mouth daily.   Yes [provider]  Netarsudil-Latanoprost 0.02-0.005 % SOLN Place 1 drop into both eyes at bedtime.   Yes [provider]  NOVOLOG MIX 70/30 FLEXPEN (70-30) 100 UNIT/ML FlexPen Inject 12 Units into the skin 2 (two) times daily with a meal.   Yes [provider]  pantoprazole (PROTONIX) 40 MG tablet Take 1 tablet (40 mg total) by mouth 2 (two) times daily before a meal. Patient taking differently: Take 40 mg by mouth daily. 10/19/18  Yes Sindy Guadeloupe, MD  rosuvastatin (CRESTOR) 10 MG tablet Take 10 mg by mouth daily.   Yes [provider]  tamsulosin (FLOMAX) 0.4 MG CAPS capsule Take 0.4 mg by mouth daily.   Yes [provider]  traZODone (DESYREL) 50 MG tablet Take 50 mg by mouth at bedtime as needed for sleep.    Yes [provider]  prochlorperazine (COMPAZINE) 10 MG tablet TAKE 1 TABLET BY MOUTH EVERY 6 HOURS AS NEEDED NAUSEA AND VOMITING 11/06/18 12/28/18  Sindy Guadeloupe, MD    Physical Exam: Vitals:   05/30/22 2049 05/30/22 2055 05/30/22 2123  BP: (!) 172/106  (!) 167/103  Pulse: (!) 109  (!) 114  Resp: 20  19  Temp: 98.4 F (36.9 C)    SpO2: 98%  99%  Weight:  77.8 kg   Height:  5\' 3"  (1.6 m)    General :patient appears nontoxic and gives a coherent account of his symptoms today, nephew at bedside  Respiratory exam: Bilateral intravesicular Abdomen abdominal exam: Bowel sounds are positive, abdomen is soft and nontender Cardiovascular exam S1-S2 normalResp : bilateral air entry vesicualr Cvs-s1s2 nromal Abd - BS+VE. All quad soft non tedner Extremities - warm no edema.  PT exam bilaterally  intravesicular  Data Reviewed:  Labs on Admission:  Results for orders placed or performed during the hospital encounter of 05/30/22 (from the past 24 hour(s))  CBC     Status: None   Collection Time: 05/30/22  9:06 PM  Result Value Ref Range   WBC 7.6 4.0 - 10.5 K/uL   RBC 5.51 4.22 - 5.81 MIL/uL   Hemoglobin 15.9 13.0 - 17.0 g/dL   HCT 47.2 39.0 - 52.0 %   MCV 85.7 80.0 - 100.0 fL   MCH 28.9 26.0 - 34.0 pg   MCHC 33.7 30.0 - 36.0 g/dL   RDW 13.6 11.5 - 15.5 %   Platelets 241 150 - 400 K/uL   nRBC 0.0 0.0 - 0.2 %  Comprehensive metabolic panel     Status: Abnormal   Collection Time: 05/30/22  9:06 PM  Result Value Ref Range   Sodium 136 135 - 145 mmol/L   Potassium 4.7 3.5 - 5.1 mmol/L   Chloride 101 98 - 111 mmol/L   CO2 19 (L) 22 -  32 mmol/L   Glucose, Bld 160 (H) 70 - 99 mg/dL   BUN 17 8 - 23 mg/dL   Creatinine, Ser 1.27 (H) 0.61 - 1.24 mg/dL   Calcium 9.7 8.9 - 10.3 mg/dL   Total Protein 8.4 (H) 6.5 - 8.1 g/dL   Albumin 3.9 3.5 - 5.0 g/dL   AST 34 15 - 41 U/L   ALT 27 0 - 44 U/L   Alkaline Phosphatase 94 38 - 126 U/L   Total Bilirubin 1.0 0.3 - 1.2 mg/dL   GFR, Estimated 56 (L) >60 mL/min   Anion gap 16 (H) 5 - 15  Lipase, blood     Status: None   Collection Time: 05/30/22  9:06 PM  Result Value Ref Range   Lipase 25 11 - 51 U/L  Lactic acid, plasma     Status: Abnormal   Collection Time: 05/30/22  9:06 PM  Result Value Ref Range   Lactic Acid, Venous 3.1 (HH) 0.5 - 1.9 mmol/L  CBG monitoring, ED     Status: Abnormal   Collection Time: 05/30/22  9:51 PM  Result Value Ref Range   Glucose-Capillary 148 (H) 70 - 99 mg/dL  Lactic acid, plasma     Status: Abnormal   Collection Time: 05/30/22 10:56 PM  Result Value Ref Range   Lactic Acid, Venous 2.8 (HH) 0.5 - 1.9 mmol/L      Radiological Exams on Admission:  CT ABDOMEN PELVIS W CONTRAST  Result Date: 05/30/2022 CLINICAL DATA:  Abdominal pain, acute, nonlocalized. EXAM: CT ABDOMEN AND PELVIS WITH CONTRAST  TECHNIQUE: Multidetector CT imaging of the abdomen and pelvis was performed using the standard protocol following bolus administration of intravenous contrast. RADIATION DOSE REDUCTION: This exam was performed according to the departmental dose-optimization program which includes automated exposure control, adjustment of the mA and/or kV according to patient size and/or use of iterative reconstruction technique. CONTRAST:  126mL OMNIPAQUE IOHEXOL 300 MG/ML  SOLN COMPARISON:  05/14/2022 FINDINGS: Lower chest: No acute abnormality. Hepatobiliary: No focal hepatic abnormality. Gallbladder unremarkable. Pancreas: No focal abnormality or ductal dilatation. Spleen: No focal abnormality.  Normal size. Adrenals/Urinary Tract: Mild fullness of the renal collecting system bilaterally is stable since prior study. No overt hydronephrosis. Small cyst in the midpole of the right kidney is stable. No stones. Adrenal glands and urinary bladder unremarkable. Stomach/Bowel: Normal appendix. Partial colectomy with left lower quadrant ostomy noted. Fall diverticulosis throughout the remaining colon. Distal ileum is decompressed. There are dilated small bowel loops in the left abdomen and into the pelvis, similar to study from 11/27/2021. Given that small bowel proximal and distal to these dilated loops are decompressed, cannot completely exclude a closed loop obstruction. Vascular/Lymphatic: Scattered aortic atherosclerosis. No evidence of aneurysm or adenopathy. Reproductive: No visible focal abnormality. Other: No free fluid or free air. Musculoskeletal: No acute bony abnormality. IMPRESSION: Prior partial colectomy with left lower quadrant ostomy. Dilated small bowel loops in the left lower abdomen and extending into the pelvis. Small bowel proximal to these loops and distal to these loops are decompressed. This raises the possibility of a closed loop obstruction. However, this finding is similar to study from 11/27/2021. Aortic  atherosclerosis. Electronically Signed   By: Rolm Baptise M.D.   On: 05/30/2022 22:26      Assessment and Plan: * Small bowel obstruction ([place]) Patient is appearing nontoxic no white count no fever.  Mild lactic acidosis is noted which is trending down.  At this time maintain patient on nasogastric tube  suction to low intermittent.  Discussed with RN.  Pain control as needed, patient is actually not reporting any pain at this time.  IV fluids.  Follow-up surgical evaluation.  Essential hypertension Resume metoprolol . However, given problem with enteral accesss use labetalol iv prn for severe htn.  Lactic acid acidosis Mild trednign down. Iv fluids.      Advance Care Planning:   Code Status: Prior full code  Consults: Dr. Teodoro Spray has been consulted by ER attending. Anticipate evaluation this evening.  Family Communication: nephew at bedside  Severity of Illness: The appropriate patient status for this patient is INPATIENT. Inpatient status is judged to be reasonable and necessary in order to provide the required intensity of service to ensure the patient's safety. The patient's presenting symptoms, physical exam findings, and initial radiographic and laboratory data in the context of their chronic comorbidities is felt to place them at high risk for further clinical deterioration. Furthermore, it is not anticipated that the patient will be medically stable for discharge from the hospital within 2 midnights of admission.   * I certify that at the point of admission it is my clinical judgment that the patient will require inpatient hospital care spanning beyond 2 midnights from the point of admission due to high intensity of service, high risk for further deterioration and high frequency of surveillance required.*  Author: Gertie Fey, MD 05/31/2022 12:09 AM  For on call review [email or web address].

## 2022-05-31 NOTE — Progress Notes (Signed)
PROGRESS NOTE    Willie [name]  [id number] DOB: 28-Dec-1940 DOA: 05/30/2022 PCP: [name], MD  Assessment & Plan:   Principal Problem:   Small bowel obstruction [facility]) Active Problems:   SBO (small bowel obstruction) (HCC)   Essential hypertension   Lactic acid acidosis  Assessment and Plan:  Likely partial SBO: NPO. Continue w/ NG tube as per gen surg. Gastrografin study ordered. 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 Cr is trending down slightly from day prior     DVT prophylaxis: lovenox Code Status: full  Family Communication:  Disposition Plan: likely d/c back home  Level of care: Med-Surg  Status is: Inpatient Remains inpatient appropriate because: severity of illness   Consultants:  General surg   Procedures:   Antimicrobials:   Subjective: Pt c/o fatigue. Pt denies any nausea & vomiting  Objective: Vitals:   05/31/22 0138 05/31/22 0402 05/31/22 0428 05/31/22 0737  BP: (!) 138/103 (!) 174/89 (!) 153/85 (!) 159/83  Pulse: (!) 105 (!) 44 91 79  Resp: 18 20  18   Temp: 98.4 F (36.9 C) 98.7 F (37.1 C)  99.4 F (37.4 C)  TempSrc:  Oral  Oral  SpO2: 96% 98%  99%  Weight:      Height:        Intake/Output Summary (Last 24 hours) at 05/31/2022 0815 Last data filed at 05/31/2022 0600 Gross per 24 hour  Intake 882.27 ml  Output 0 ml  Net 882.27 ml   Filed Weights   05/30/22 2055  Weight: 77.8 kg    Examination:  General exam: Appears calm and comfortable. NG tube in place Respiratory system: Clear to auscultation. Respiratory effort normal. Cardiovascular system: S1 & S2 +. No rubs, gallops or clicks. No pedal edema. Gastrointestinal system: Abdomen is nondistended, soft and nontender, hypoactive bowel sounds Central nervous system: Alert and oriented. Moves all extremities  Psychiatry: Judgement and insight appear  normal. Mood & affect appropriate.     Data Reviewed: I have personally reviewed following labs and imaging studies  CBC: Recent Labs  Lab 05/30/22 2106 05/31/22 0500  WBC 7.6 5.6  HGB 15.9 14.2  HCT 47.2 42.5  MCV 85.7 86.2  PLT 241 0000000   Basic Metabolic Panel: Recent Labs  Lab 05/30/22 2106 05/31/22 0500  NA 136 142  K 4.7 4.3  CL 101 104  CO2 19* 24  GLUCOSE 160* 149*  BUN 17 17  CREATININE 1.27* 1.26*  CALCIUM 9.7 9.3   GFR: Estimated Creatinine Clearance: 41 mL/min (A) (by C-G formula based on SCr of 1.26 mg/dL (H)). Liver Function Tests: Recent Labs  Lab 05/30/22 2106  AST 34  ALT 27  ALKPHOS 94  BILITOT 1.0  PROT 8.4*  ALBUMIN 3.9   Recent Labs  Lab 05/30/22 2106  LIPASE 25   No results for input(s): "AMMONIA" in the last 168 hours. Coagulation Profile: Recent Labs  Lab 05/31/22 0228  INR 1.1   Cardiac Enzymes: No results for input(s): "CKTOTAL", "CKMB", "CKMBINDEX", "TROPONINI" in the last 168 hours. BNP (last 3 results) No results for input(s): "PROBNP" in the last 8760 hours. HbA1C: No results for input(s): "HGBA1C" in the last 72 hours. CBG: Recent Labs  Lab 05/30/22 2151 05/31/22 0415 05/31/22 0746  GLUCAP 148* 153* 113*   Lipid Profile: No results for input(s): "CHOL", "HDL", "LDLCALC", "TRIG", "CHOLHDL", "LDLDIRECT" in the last 72 hours.  Thyroid Function Tests: No results for input(s): "TSH", "T4TOTAL", "FREET4", "T3FREE", "THYROIDAB" in the last 72 hours. Anemia Panel: No results for input(s): "VITAMINB12", "FOLATE", "FERRITIN", "TIBC", "IRON", "RETICCTPCT" in the last 72 hours. Sepsis Labs: Recent Labs  Lab 05/30/22 2106 05/30/22 2256 05/31/22 0500  LATICACIDVEN 3.1* 2.8* 1.0    No results found for this or any previous visit (from the past 240 hour(s)).       Radiology Studies: DG Abdomen 1 View  Result Date: 05/31/2022 CLINICAL DATA:  NG tube placement EXAM: ABDOMEN - 1 VIEW COMPARISON:  05/31/2022  FINDINGS: NG tube is in the stomach.  Visualized lungs clear. IMPRESSION: NG tube in the stomach. Electronically Signed   By: Rolm Baptise M.D.   On: 05/31/2022 01:13   DG Abdomen 1 View  Result Date: 05/31/2022 CLINICAL DATA:  Nasogastric tube placement EXAM: ABDOMEN - 1 VIEW COMPARISON:  11/23/2021 FINDINGS: Nasogastric tube tip overlies the gastric fundus. The abdominal gas pattern is indeterminate due to a paucity of intra-abdominal gas exclusion of the pelvis. No free intraperitoneal gas. Lung bases are clear. IMPRESSION: 1. Nasogastric tube tip within the gastric fundus. Electronically Signed   By: Fidela Salisbury M.D.   On: 05/31/2022 00:51   CT ABDOMEN PELVIS W CONTRAST  Result Date: 05/30/2022 CLINICAL DATA:  Abdominal pain, acute, nonlocalized. EXAM: CT ABDOMEN AND PELVIS WITH CONTRAST TECHNIQUE: Multidetector CT imaging of the abdomen and pelvis was performed using the standard protocol following bolus administration of intravenous contrast. RADIATION DOSE REDUCTION: This exam was performed according to the departmental dose-optimization program which includes automated exposure control, adjustment of the mA and/or kV according to patient size and/or use of iterative reconstruction technique. CONTRAST:  168mL OMNIPAQUE IOHEXOL 300 MG/ML  SOLN COMPARISON:  05/14/2022 FINDINGS: Lower chest: No acute abnormality. Hepatobiliary: No focal hepatic abnormality. Gallbladder unremarkable. Pancreas: No focal abnormality or ductal dilatation. Spleen: No focal abnormality.  Normal size. Adrenals/Urinary Tract: Mild fullness of the renal collecting system bilaterally is stable since prior study. No overt hydronephrosis. Small cyst in the midpole of the right kidney is stable. No stones. Adrenal glands and urinary bladder unremarkable. Stomach/Bowel: Normal appendix. Partial colectomy with left lower quadrant ostomy noted. Fall diverticulosis throughout the remaining colon. Distal ileum is decompressed. There  are dilated small bowel loops in the left abdomen and into the pelvis, similar to study from 11/27/2021. Given that small bowel proximal and distal to these dilated loops are decompressed, cannot completely exclude a closed loop obstruction. Vascular/Lymphatic: Scattered aortic atherosclerosis. No evidence of aneurysm or adenopathy. Reproductive: No visible focal abnormality. Other: No free fluid or free air. Musculoskeletal: No acute bony abnormality. IMPRESSION: Prior partial colectomy with left lower quadrant ostomy. Dilated small bowel loops in the left lower abdomen and extending into the pelvis. Small bowel proximal to these loops and distal to these loops are decompressed. This raises the possibility of a closed loop obstruction. However, this finding is similar to study from 11/27/2021. Aortic atherosclerosis. Electronically Signed   By: Rolm Baptise M.D.   On: 05/30/2022 22:26        Scheduled Meds:  [START ON 06/04/2022] aspirin EC  81 mg Oral Once per day on Tue Wed Thu   diatrizoate meglumine-sodium  90 mL Per NG tube Once   enoxaparin (LOVENOX) injection  40 mg Subcutaneous Q24H   insulin aspart  0-9 Units Subcutaneous Q4H   lisinopril  10 mg Oral Daily   metoprolol succinate  50 mg Oral Daily   pantoprazole (  PROTONIX) IV  40 mg Intravenous Q0600   rosuvastatin  10 mg Oral Daily   sodium chloride flush  3 mL Intravenous Q12H   Continuous Infusions:  ampicillin-sulbactam (UNASYN) IV 3 g (05/31/22 0426)   lactated ringers 125 mL/hr at 05/31/22 0634     LOS: 0 days    Time spent: 35 mins     Wyvonnia Dusky, MD [facility] Pager [phone]  If 7PM-7AM, please contact night-coverage [email or web address] 05/31/2022, 8:15 AM

## 2022-05-31 NOTE — Plan of Care (Signed)
  Problem: Health Behavior/Discharge Planning: Goal: Ability to manage health-related needs will improve Outcome: Progressing   

## 2022-05-31 NOTE — Progress Notes (Signed)
Will give unasyn give lactic acidosis, abd pain in patient, and concern for bowel ischeima

## 2022-05-31 NOTE — Progress Notes (Signed)
Patient ID: Willie [name], male   DOB: 03-Jan-1941, 82 y.o.   MRN: [id number]     [place] [facility] Day(s): 0.   Interval History: Patient seen and examined, no acute events or new complaints overnight. Patient reports feeling much better this morning.  He actually said that he feels great.  Cannot recall if he was passing gas through the ostomy.  The bag was empty but he told me that he was recently changed by the nurse.  No documentation of ostomy output.  Vital signs in last 24 hours: [min-max] current  Temp:  [98.2 F (36.8 C)-99.4 F (37.4 C)] 99.4 F (37.4 C) (03/22 0737) Pulse Rate:  [44-114] 79 (03/22 0737) Resp:  [18-20] 18 (03/22 0737) BP: (138-174)/(83-106) 159/83 (03/22 0737) SpO2:  [96 %-99 %] 99 % (03/22 0737) Weight:  [77.8 kg] 77.8 kg (03/21 2055)     Height: 5\' 3"  (160 cm) Weight: 77.8 kg BMI (Calculated): 30.41   Physical Exam:  Constitutional: alert, cooperative and no distress  Respiratory: breathing non-labored at rest  Cardiovascular: regular rate and sinus rhythm  Gastrointestinal: soft, non-tender, and non-distended.  The ostomy is pink and patent with bag empty.  No gas or stool.  Labs:     Latest Ref Rng & Units 05/31/2022    5:00 AM 05/30/2022    9:06 PM 05/13/2022    3:42 PM  CBC  WBC 4.0 - 10.5 K/uL 5.6  7.6  3.8   Hemoglobin 13.0 - 17.0 g/dL 14.2  15.9  12.8   Hematocrit 39.0 - 52.0 % 42.5  47.2  38.7   Platelets 150 - 400 K/uL 204  241  201       Latest Ref Rng & Units 05/31/2022    5:00 AM 05/30/2022    9:06 PM 05/13/2022    3:42 PM  CMP  Glucose 70 - 99 mg/dL 149  160  178   BUN 8 - 23 mg/dL 17  17  28    Creatinine 0.61 - 1.24 mg/dL 1.26  1.27  1.47   Sodium 135 - 145 mmol/L 142  136  136   Potassium 3.5 - 5.1 mmol/L 4.3  4.7  3.9   Chloride 98 - 111 mmol/L 104  101  106   CO2 22 - 32 mmol/L 24  19  22    Calcium 8.9 - 10.3 mg/dL 9.3  9.7  8.9   Total Protein 6.5 - 8.1 g/dL  8.4  7.5   Total Bilirubin 0.3 - 1.2 mg/dL  1.0  0.5    Alkaline Phos 38 - 126 U/L  94  81   AST 15 - 41 U/L  34  20   ALT 0 - 44 U/L  27  19     Imaging studies: I personally evaluated the CT scan of the abdomen and pelvis done last night and the abdominal x-ray done this morning with NGT in place.  In the x-ray there is no significant small bowel dilation.  CT scan with small segment of small bowel loop dilated.   Assessment/Plan:  82 y.o. male with suspected partial SBO, complicated by pertinent comorbidities including rectal cancer.   Patient with improved abdominal pain this morning -Feels labs are also better.  White blood cell count within normal limits.  Lactic acidosis normalized with IV hydration. -Abdominal exam with no significantly distended abdomen. -Will order Gastrografin contrast and perform serial x-rays for further evaluation of bowel obstruction. -Discussed with patient that  if bowel obstruction does not resolve with conservative management he may need surgical intubation again. -At this moment there is no acute abdomen or any need for emergent surgical intervention.  Will continue to follow closely.  Arnold Long, MD

## 2022-05-31 NOTE — Consult Note (Signed)
Date of Consultation:  05/31/2022  Requesting Physician:  Nathaniel Man, MD  Reason for Consultation:  Small bowel obstruction  History of Present Illness: Willie [name] is a 82 y.o. male presenting with a one day history of abdominal pain, nausea, and vomiting.  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  He has had episodes of SBO and recently needed robotic assisted lysis of adhesions in 11/2021 with Dr. Windell Moment.  The patient reports that he started feeling bad after breakfast.  His nephew changed his ostomy appliance today and he has not had any stool today.  He started having nausea and a few episodes of emesis at home.  He denies too much pain, but reports discomfort with the emesis.  He presented to the ED as this seemed similar to prior episodes.  He had a lactic acid of 3.1 and a CT scan which was concerning for a possible closed loop obstruction.  Per Dr. Jori Moll, he appeared sickly and was tachycardic with a concerning abdominal exam and General Surgery was consulted to evaluate him.  NG tube was placed.  On my evaluation, the patient reports that he feels much better.  Denies any abdominal pain and appears comfortable watching the game with his nephew who is at bedside.  Past Medical History: Past Medical History:  Diagnosis Date   Anemia    Bowel obstruction (HCC)    Coronary artery disease    Diabetes mellitus without complication (HCC)    GERD (gastroesophageal reflux disease)    History of hiatal hernia    Hypercholesterolemia    Hypertension    Prostate cancer ([place]) 2000   Prostatectomy.    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[place]) 09/2018   Chemo tx's.    Wears dentures    partial lower     Past Surgical History: Past Surgical History:  Procedure Laterality Date   CATARACT EXTRACTION W/PHACO Right 10/12/2019   Procedure: CATARACT EXTRACTION PHACO AND INTRAOCULAR LENS PLACEMENT (IOC) RIGHT  DIABETIC 6.35 00:37.1;  Surgeon: Birder Robson, MD;  Location: [place];  Service: Ophthalmology;  Laterality: Right;   COLONOSCOPY N/A 05/02/2021   Procedure: COLONOSCOPY;  Surgeon: [name], [name], MD;  Location: [facility] ENDOSCOPY;  Service: Gastroenterology;  Laterality: N/A;  IDDM   COLONOSCOPY WITH PROPOFOL N/A 07/15/2018   Procedure: COLONOSCOPY WITH PROPOFOL;  Surgeon: [name], [name], MD;  Location: [facility] ENDOSCOPY;  Service: Gastroenterology;  Laterality: N/A;   COLOSTOMY Left    ESOPHAGOGASTRODUODENOSCOPY N/A 10/12/2018   Procedure: ESOPHAGOGASTRODUODENOSCOPY (EGD);  Surgeon: [name], MD;  Location: [facility] ENDOSCOPY;  Service: Gastroenterology;  Laterality: N/A;   ESOPHAGOGASTRODUODENOSCOPY (EGD) WITH PROPOFOL N/A 07/15/2018   Procedure: ESOPHAGOGASTRODUODENOSCOPY (EGD) WITH PROPOFOL;  Surgeon: [name], [name], MD;  Location: [facility] ENDOSCOPY;  Service: Gastroenterology;  Laterality: N/A;   PORTACATH PLACEMENT N/A 08/13/2018   Procedure: INSERTION PORT-A-CATH;  Surgeon: [name], MD;  Location: [facility] ORS;  Service: General;  Laterality: N/A;   PROSTATE SURGERY     ROBOT ASSISTED LAPAROSCOPIC PARTIAL COLECTOMY  05/12/2019   [place]   XI ROBOT ASSISTED DIAGNOSTIC LAPAROSCOPY N/A 11/23/2021   Procedure: XI ROBOT ASSISTED DIAGNOSTIC LAPAROSCOPY;  Surgeon: [name], MD;  Location: [facility] ORS;  Service: General;  Laterality: N/A;    Home Medications: Prior to Admission medications   Medication Sig Start Date End Date Taking? Authorizing Provider  aspirin EC 81 MG tablet Take 81 mg by mouth 3 (three) times  a week. Tues weds, thurs   Yes [provider]  brimonidine (ALPHAGAN) 0.2 % ophthalmic solution Place 1 drop into both eyes 2 (two) times daily.   Yes [provider]  lisinopril (ZESTRIL) 10 MG tablet Take 10 mg by mouth daily.   Yes [provider]  metoprolol succinate (TOPROL-XL) 50 MG 24 hr tablet Take 50 mg by  mouth daily.   Yes [provider]  Netarsudil-Latanoprost 0.02-0.005 % SOLN Place 1 drop into both eyes at bedtime.   Yes [provider]  NOVOLOG MIX 70/30 FLEXPEN (70-30) 100 UNIT/ML FlexPen Inject 12 Units into the skin 2 (two) times daily with a meal.   Yes [provider]  pantoprazole (PROTONIX) 40 MG tablet Take 1 tablet (40 mg total) by mouth 2 (two) times daily before a meal. Patient taking differently: Take 40 mg by mouth daily. 10/19/18  Yes Sindy Guadeloupe, MD  rosuvastatin (CRESTOR) 10 MG tablet Take 10 mg by mouth daily.   Yes [provider]  tamsulosin (FLOMAX) 0.4 MG CAPS capsule Take 0.4 mg by mouth daily.   Yes [provider]  traZODone (DESYREL) 50 MG tablet Take 50 mg by mouth at bedtime as needed for sleep.    Yes [provider]  prochlorperazine (COMPAZINE) 10 MG tablet TAKE 1 TABLET BY MOUTH EVERY 6 HOURS AS NEEDED NAUSEA AND VOMITING 11/06/18 12/28/18  Sindy Guadeloupe, MD    Allergies: No Known Allergies  Social History:  reports that he has never smoked. He has never used smokeless tobacco. He reports that he does not currently use alcohol. He reports that he does not use drugs.   Family History: Family History  Problem Relation Age of Onset   Diabetes Mother    Ovarian cancer Sister     Review of Systems: Review of Systems  Constitutional:  Negative for chills and fever.  HENT:  Negative for hearing loss.   Respiratory:  Negative for shortness of breath.   Cardiovascular:  Negative for chest pain.  Gastrointestinal:  Positive for abdominal pain, nausea and vomiting.  Genitourinary:  Negative for dysuria.  Musculoskeletal:  Negative for myalgias.  Skin:  Negative for rash.  Neurological:  Negative for dizziness.  Psychiatric/Behavioral:  Negative for depression.     Physical Exam BP (!) 174/93 (BP Location: Left Arm)   Pulse (!) 108   Temp 98.2 F (36.8 C) (Oral)   Resp 20   Ht 5\' 3"  (1.6 m)    Wt 77.8 kg   SpO2 99%   BMI 30.40 kg/m  CONSTITUTIONAL: No acute distress HEENT:  Normocephalic, atraumatic, extraocular motion intact. NECK: Trachea is midline, and there is no jugular venous distension. RESPIRATORY:  Normal respiratory effort without pathologic use of accessory muscles. CARDIOVASCULAR: Low grade sinus tachycardia. GI: The abdomen is soft, somewhat/mildly distended, non-tender to palpation.  NG in place with gastric contents.  MUSCULOSKELETAL:  Normal muscle strength and tone in all four extremities.  No peripheral edema or cyanosis. SKIN: Skin turgor is normal. There are no pathologic skin lesions.  NEUROLOGIC:  Motor and sensation is grossly normal.  Cranial nerves are grossly intact. PSYCH:  Alert and oriented to person, place and time. Affect is normal.  Laboratory Analysis: Results for orders placed or performed during the hospital encounter of 05/30/22 (from the past 24 hour(s))  CBC     Status: None   Collection Time: 05/30/22  9:06 PM  Result Value Ref Range   WBC 7.6 4.0 -  10.5 K/uL   RBC 5.51 4.22 - 5.81 MIL/uL   Hemoglobin 15.9 13.0 - 17.0 g/dL   HCT 47.2 39.0 - 52.0 %   MCV 85.7 80.0 - 100.0 fL   MCH 28.9 26.0 - 34.0 pg   MCHC 33.7 30.0 - 36.0 g/dL   RDW 13.6 11.5 - 15.5 %   Platelets 241 150 - 400 K/uL   nRBC 0.0 0.0 - 0.2 %  Comprehensive metabolic panel     Status: Abnormal   Collection Time: 05/30/22  9:06 PM  Result Value Ref Range   Sodium 136 135 - 145 mmol/L   Potassium 4.7 3.5 - 5.1 mmol/L   Chloride 101 98 - 111 mmol/L   CO2 19 (L) 22 - 32 mmol/L   Glucose, Bld 160 (H) 70 - 99 mg/dL   BUN 17 8 - 23 mg/dL   Creatinine, Ser 1.27 (H) 0.61 - 1.24 mg/dL   Calcium 9.7 8.9 - 10.3 mg/dL   Total Protein 8.4 (H) 6.5 - 8.1 g/dL   Albumin 3.9 3.5 - 5.0 g/dL   AST 34 15 - 41 U/L   ALT 27 0 - 44 U/L   Alkaline Phosphatase 94 38 - 126 U/L   Total Bilirubin 1.0 0.3 - 1.2 mg/dL   GFR, Estimated 56 (L) >60 mL/min   Anion gap 16 (H) 5 - 15   Lipase, blood     Status: None   Collection Time: 05/30/22  9:06 PM  Result Value Ref Range   Lipase 25 11 - 51 U/L  Lactic acid, plasma     Status: Abnormal   Collection Time: 05/30/22  9:06 PM  Result Value Ref Range   Lactic Acid, Venous 3.1 (HH) 0.5 - 1.9 mmol/L  CBG monitoring, ED     Status: Abnormal   Collection Time: 05/30/22  9:51 PM  Result Value Ref Range   Glucose-Capillary 148 (H) 70 - 99 mg/dL  Lactic acid, plasma     Status: Abnormal   Collection Time: 05/30/22 10:56 PM  Result Value Ref Range   Lactic Acid, Venous 2.8 (HH) 0.5 - 1.9 mmol/L    Imaging: DG Abdomen 1 View  Result Date: 05/31/2022 CLINICAL DATA:  NG tube placement EXAM: ABDOMEN - 1 VIEW COMPARISON:  05/31/2022 FINDINGS: NG tube is in the stomach.  Visualized lungs clear. IMPRESSION: NG tube in the stomach. Electronically Signed   By: Rolm Baptise M.D.   On: 05/31/2022 01:13   DG Abdomen 1 View  Result Date: 05/31/2022 CLINICAL DATA:  Nasogastric tube placement EXAM: ABDOMEN - 1 VIEW COMPARISON:  11/23/2021 FINDINGS: Nasogastric tube tip overlies the gastric fundus. The abdominal gas pattern is indeterminate due to a paucity of intra-abdominal gas exclusion of the pelvis. No free intraperitoneal gas. Lung bases are clear. IMPRESSION: 1. Nasogastric tube tip within the gastric fundus. Electronically Signed   By: Fidela Salisbury M.D.   On: 05/31/2022 00:51   CT ABDOMEN PELVIS W CONTRAST  Result Date: 05/30/2022 CLINICAL DATA:  Abdominal pain, acute, nonlocalized. EXAM: CT ABDOMEN AND PELVIS WITH CONTRAST TECHNIQUE: Multidetector CT imaging of the abdomen and pelvis was performed using the standard protocol following bolus administration of intravenous contrast. RADIATION DOSE REDUCTION: This exam was performed according to the departmental dose-optimization program which includes automated exposure control, adjustment of the mA and/or kV according to patient size and/or use of iterative reconstruction  technique. CONTRAST:  160mL OMNIPAQUE IOHEXOL 300 MG/ML  SOLN COMPARISON:  05/14/2022 FINDINGS: Lower chest: No acute  abnormality. Hepatobiliary: No focal hepatic abnormality. Gallbladder unremarkable. Pancreas: No focal abnormality or ductal dilatation. Spleen: No focal abnormality.  Normal size. Adrenals/Urinary Tract: Mild fullness of the renal collecting system bilaterally is stable since prior study. No overt hydronephrosis. Small cyst in the midpole of the right kidney is stable. No stones. Adrenal glands and urinary bladder unremarkable. Stomach/Bowel: Normal appendix. Partial colectomy with left lower quadrant ostomy noted. Fall diverticulosis throughout the remaining colon. Distal ileum is decompressed. There are dilated small bowel loops in the left abdomen and into the pelvis, similar to study from 11/27/2021. Given that small bowel proximal and distal to these dilated loops are decompressed, cannot completely exclude a closed loop obstruction. Vascular/Lymphatic: Scattered aortic atherosclerosis. No evidence of aneurysm or adenopathy. Reproductive: No visible focal abnormality. Other: No free fluid or free air. Musculoskeletal: No acute bony abnormality. IMPRESSION: Prior partial colectomy with left lower quadrant ostomy. Dilated small bowel loops in the left lower abdomen and extending into the pelvis. Small bowel proximal to these loops and distal to these loops are decompressed. This raises the possibility of a closed loop obstruction. However, this finding is similar to study from 11/27/2021. Aortic atherosclerosis. Electronically Signed   By: Rolm Baptise M.D.   On: 05/30/2022 22:26    Assessment and Plan: This is a 82 y.o. male with small bowel obstruction.  --Discussed with the patient the findings on his labs and CT scan images.  There is concern for a closed loop obstruction, and his lactic acid is elevated, with some response to IV fluids.  However, on exam, he is certainly not peritoneal,  without any tenderness at all and with a very reassuring abdominal exam.  Discussed with him that for now we can continue watchful monitoring and serial labs.  If any worsening, would discuss again the possible need for surgery in the form of exploratory laparotomy. --For now, continue NPO diet, NG tube to suction, IV fluid hydration, serial labs/exams.  I spent 60 minutes dedicated to the care of this patient on the date of this encounter to include pre-visit review of records, face-to-face time with the patient discussing diagnosis and management, and any post-visit coordination of care.   Melvyn Neth, MD [facility] Pg:  [phone]

## 2022-05-31 NOTE — Assessment & Plan Note (Signed)
Resume metoprolol . However, given problem with enteral accesss use labetalol iv prn for severe htn.

## 2022-05-31 NOTE — TOC Initial Note (Signed)
Transition of Care [facility]) - Initial/Assessment Note    Patient Details  Name: Willie [name] MRN: [id number] Date of Birth: 03-24-1940  Transition of Care [facility]) CM/SW Contact:    Beverly Sessions, RN Phone Number: 05/31/2022, 11:17 AM  Clinical Narrative:                   Transition of Care [facility]) Screening Note   Patient Details  Name: Willie [name] Date of Birth: 11-May-1940   Transition of Care [facility]) CM/SW Contact:    Beverly Sessions, RN Phone Number: 05/31/2022, 11:17 AM    Transition of Care Department [facility]) has reviewed patient and no TOC needs have been identified at this time. We will continue to monitor patient advancement through interdisciplinary progression rounds. If new patient transition needs arise, please place a TOC consult.         Patient Goals and CMS Choice            Expected Discharge Plan and Services                                              Prior Living Arrangements/Services                       Activities of Daily Living Home Assistive Devices/Equipment: Dentures (specify type) ADL Screening (condition at time of admission) Patient's cognitive ability adequate to safely complete daily activities?: Yes Is the patient deaf or have difficulty hearing?: No Does the patient have difficulty seeing, even when wearing glasses/contacts?: No Does the patient have difficulty concentrating, remembering, or making decisions?: No Patient able to express need for assistance with ADLs?: Yes Does the patient have difficulty dressing or bathing?: No Independently performs ADLs?: Yes (appropriate for developmental age) Does the patient have difficulty walking or climbing stairs?: Yes Weakness of Legs: None Weakness of Arms/Hands: None  Permission Sought/Granted                  Emotional Assessment              Admission diagnosis:  Small bowel obstruction ([place]) [K56.609] SBO (small bowel obstruction)  ([place]) [K56.609] Abdominal pain, unspecified abdominal location [R10.9] Patient Active Problem List   Diagnosis Date Noted   Small bowel obstruction ([place]) 05/31/2022   Lactic acid acidosis 05/31/2022   Hypertensive urgency 11/21/2021   AKI (acute kidney injury) ([place]) 11/21/2021   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   SBO (small bowel obstruction) ([place]) 09/23/2021   CAD (coronary artery disease) 09/23/2021   Type II diabetes mellitus with renal manifestations ([place]) 09/23/2021   Chronic kidney disease, stage 3a ([place]) 08/01/2021   GERD (gastroesophageal reflux disease) 02/08/2021   Red blood cell antibody positive, 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 05/20/2019   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/13/2019   Chemotherapy induced neutropenia ([place]) 03/11/2019   Hypokalemia 10/27/2018   Protein-calorie malnutrition, severe 10/12/2018   Sepsis ([place]) 10/10/2018   Hypercholesteremia 08/17/2018   Goals of care, counseling/discussion 08/05/2018   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[place]) 08/05/2018   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   History of gastric ulcer 07/07/2018   History of NSVT (nonsustained ventricular tachycardia) ([place]) 10/31/2017   Benign prostatic hyperplasia 09/16/2017   Controlled type 2 diabetes mellitus without complication, with long-term current use of insulin ([place]) 09/16/2017   Coronary artery  disease involving native heart without angina pectoris 09/16/2017   Essential hypertension 09/16/2017   Onychomycosis due to dermatophyte 05/28/2013   PCP:  [name], MD Pharmacy:   [place], [place] [address] Phone: [phone] Fax: [phone]     Social Determinants of Health (SDOH) Social History: SDOH Screenings   Food Insecurity: No Food Insecurity (05/31/2022)  Housing: Low Risk  (05/31/2022)  Transportation Needs: No Transportation Needs (05/31/2022)  Utilities: Not At Risk (05/31/2022)  Alcohol  Screen: Low Risk  (08/31/2018)  Depression (PHQ2-9): Low Risk  (08/31/2018)  Financial Resource Strain: Low Risk  (11/01/2017)  Physical Activity: Insufficiently Active (11/01/2017)  Social Connections: Unknown (08/31/2018)  Stress: No Stress Concern Present (11/01/2017)  Tobacco Use: Low Risk  (05/30/2022)   SDOH Interventions:     Readmission Risk Interventions     No data to display

## 2022-06-01 ENCOUNTER — Inpatient Hospital Stay: Payer: Medicare PPO

## 2022-06-01 DIAGNOSIS — K56609 Unspecified intestinal obstruction, unspecified as to partial versus complete obstruction: Secondary | ICD-10-CM | POA: Diagnosis not present

## 2022-06-01 DIAGNOSIS — N1831 Chronic kidney disease, stage 3a: Secondary | ICD-10-CM | POA: Diagnosis not present

## 2022-06-01 DIAGNOSIS — I10 Essential (primary) hypertension: Secondary | ICD-10-CM | POA: Diagnosis not present

## 2022-06-01 LAB — CBC
HCT: 41.9 % (ref 39.0–52.0)
Hemoglobin: 13.8 g/dL (ref 13.0–17.0)
MCH: 28.7 pg (ref 26.0–34.0)
MCHC: 32.9 g/dL (ref 30.0–36.0)
MCV: 87.1 fL (ref 80.0–100.0)
Platelets: 188 10*3/uL (ref 150–400)
RBC: 4.81 MIL/uL (ref 4.22–5.81)
RDW: 13.8 % (ref 11.5–15.5)
WBC: 4.4 10*3/uL (ref 4.0–10.5)
nRBC: 0 % (ref 0.0–0.2)

## 2022-06-01 LAB — BASIC METABOLIC PANEL
Anion gap: 12 (ref 5–15)
BUN: 13 mg/dL (ref 8–23)
CO2: 24 mmol/L (ref 22–32)
Calcium: 8.9 mg/dL (ref 8.9–10.3)
Chloride: 99 mmol/L (ref 98–111)
Creatinine, Ser: 1.15 mg/dL (ref 0.61–1.24)
GFR, Estimated: >60 mL/min (ref >60)
Glucose, Bld: 151 mg/dL — ABNORMAL HIGH (ref 70–99)
Potassium: 4 mmol/L (ref 3.5–5.1)
Sodium: 135 mmol/L (ref 135–145)

## 2022-06-01 LAB — GLUCOSE, CAPILLARY
Glucose-Capillary: 140 mg/dL — ABNORMAL HIGH (ref 70–99)
Glucose-Capillary: 115 mg/dL — ABNORMAL HIGH (ref 70–99)
Glucose-Capillary: 204 mg/dL — ABNORMAL HIGH (ref 70–99)
Glucose-Capillary: 126 mg/dL — ABNORMAL HIGH (ref 70–99)
Glucose-Capillary: 182 mg/dL — ABNORMAL HIGH (ref 70–99)

## 2022-06-01 NOTE — Progress Notes (Signed)
PROGRESS NOTE    Willie [name]  [id number] DOB: 18-Apr-1940 DOA: 05/30/2022 PCP: [name], MD  Assessment & Plan:   Principal Problem:   Small bowel obstruction [facility]) Active Problems:   SBO (small bowel obstruction) (HCC)   Essential hypertension   Lactic acid acidosis  Assessment and Plan:  Likely partial SBO: started on clear liquid diet as per gen surg. Stool output in colostomy bag. NG tube came out while pt was eating broth. Hx of rectal cancer  HTN: will restart home dose of metoprolol   Lactic acidosis: resolved  Likely CKDIIIa: baseline Cr/GFR is unknown, currently stage IIIa. Cr is trending down again today      DVT prophylaxis: lovenox Code Status: full  Family Communication:  Disposition Plan: likely d/c back home  Level of care: Med-Surg  Status is: Inpatient Remains inpatient appropriate because: severity of illness   Consultants:  General surg   Procedures:   Antimicrobials:   Subjective: Pt c/o fatigue. Pt denies any nausea & vomiting  Objective: Vitals:   05/31/22 1956 05/31/22 2105 06/01/22 0424 06/01/22 0527  BP: (!) 194/83 (!) 174/80 (!) 193/98 (!) 151/75  Pulse: 83 78 84 78  Resp: 20  20   Temp: 98.7 F (37.1 C)  98.6 F (37 C)   TempSrc: Oral  Oral   SpO2: 94% 96% 95%   Weight:      Height:        Intake/Output Summary (Last 24 hours) at 06/01/2022 0751 Last data filed at 06/01/2022 0510 Gross per 24 hour  Intake 2002.59 ml  Output 1525 ml  Net 477.59 ml   Filed Weights   05/30/22 2055  Weight: 77.8 kg    Examination:  General exam: Appears comfortable  Respiratory system: clear breath sounds b/l Cardiovascular system: S1/S2+. No gallops or rubs  Gastrointestinal system: Abd is soft, NT, ND & normal bowel sounds  Central nervous system: Alert and oriented. Moves all extremities  Psychiatry: Judgement and insight appears normal. Appropriate mood and affect    Data Reviewed: I have personally reviewed  following labs and imaging studies  CBC: Recent Labs  Lab 05/30/22 2106 05/31/22 0500 06/01/22 0446  WBC 7.6 5.6 4.4  HGB 15.9 14.2 13.8  HCT 47.2 42.5 41.9  MCV 85.7 86.2 87.1  PLT 241 204 0000000   Basic Metabolic Panel: Recent Labs  Lab 05/30/22 2106 05/31/22 0500 06/01/22 0446  NA 136 142 135  K 4.7 4.3 4.0  CL 101 104 99  CO2 19* 24 24  GLUCOSE 160* 149* 151*  BUN 17 17 13   CREATININE 1.27* 1.26* 1.15  CALCIUM 9.7 9.3 8.9   GFR: Estimated Creatinine Clearance: 45 mL/min (by C-G formula based on SCr of 1.15 mg/dL). Liver Function Tests: Recent Labs  Lab 05/30/22 2106  AST 34  ALT 27  ALKPHOS 94  BILITOT 1.0  PROT 8.4*  ALBUMIN 3.9   Recent Labs  Lab 05/30/22 2106  LIPASE 25   No results for input(s): "AMMONIA" in the last 168 hours. Coagulation Profile: Recent Labs  Lab 05/31/22 0228  INR 1.1   Cardiac Enzymes: No results for input(s): "CKTOTAL", "CKMB", "CKMBINDEX", "TROPONINI" in the last 168 hours. BNP (last 3 results) No results for input(s): "PROBNP" in the last 8760 hours. HbA1C: No results for input(s): "HGBA1C" in the last 72 hours. CBG: Recent Labs  Lab 05/31/22 1101 05/31/22 1621 05/31/22 2001 05/31/22 2339 06/01/22 0401  GLUCAP 122* 134* 128* 115* 140*   Lipid  Profile: No results for input(s): "CHOL", "HDL", "LDLCALC", "TRIG", "CHOLHDL", "LDLDIRECT" in the last 72 hours. Thyroid Function Tests: No results for input(s): "TSH", "T4TOTAL", "FREET4", "T3FREE", "THYROIDAB" in the last 72 hours. Anemia Panel: No results for input(s): "VITAMINB12", "FOLATE", "FERRITIN", "TIBC", "IRON", "RETICCTPCT" in the last 72 hours. Sepsis Labs: Recent Labs  Lab 05/30/22 2106 05/30/22 2256 05/31/22 0500  LATICACIDVEN 3.1* 2.8* 1.0    No results found for this or any previous visit (from the past 240 hour(s)).       Radiology Studies: DG Abd Portable 1V-Small Bowel Obstruction Protocol-initial, 8 hr delay  Result Date:  05/31/2022 CLINICAL DATA:  "8 hour delayed film" or small bowel obstruction EXAM: PORTABLE ABDOMEN - 1 VIEW COMPARISON:  05/31/2022 at 1 a.m. FINDINGS: A nasogastric tube is in the stomach with tip in the stomach body near the fundus. There is contrast medium in the stomach body and fundus. No substantial contrast in the distal stomach, small bowel, or large bowel. Gas and stool is present in the colon. Small bowel loops in the left abdomen measure up to 3.3 cm in diameter, mildly prominent. IMPRESSION: 1. Mildly dilated small bowel loops in the left abdomen measuring up to 3.3 cm in diameter. 2. Contrast medium in the stomach body and fundus. No substantial contrast in the distal stomach, small bowel, or large bowel. Electronically Signed   By: Van Clines M.D.   On: 05/31/2022 16:18   DG Abdomen 1 View  Result Date: 05/31/2022 CLINICAL DATA:  NG tube placement EXAM: ABDOMEN - 1 VIEW COMPARISON:  05/31/2022 FINDINGS: NG tube is in the stomach.  Visualized lungs clear. IMPRESSION: NG tube in the stomach. Electronically Signed   By: Rolm Baptise M.D.   On: 05/31/2022 01:13   DG Abdomen 1 View  Result Date: 05/31/2022 CLINICAL DATA:  Nasogastric tube placement EXAM: ABDOMEN - 1 VIEW COMPARISON:  11/23/2021 FINDINGS: Nasogastric tube tip overlies the gastric fundus. The abdominal gas pattern is indeterminate due to a paucity of intra-abdominal gas exclusion of the pelvis. No free intraperitoneal gas. Lung bases are clear. IMPRESSION: 1. Nasogastric tube tip within the gastric fundus. Electronically Signed   By: Fidela Salisbury M.D.   On: 05/31/2022 00:51   CT ABDOMEN PELVIS W CONTRAST  Result Date: 05/30/2022 CLINICAL DATA:  Abdominal pain, acute, nonlocalized. EXAM: CT ABDOMEN AND PELVIS WITH CONTRAST TECHNIQUE: Multidetector CT imaging of the abdomen and pelvis was performed using the standard protocol following bolus administration of intravenous contrast. RADIATION DOSE REDUCTION: This exam was  performed according to the departmental dose-optimization program which includes automated exposure control, adjustment of the mA and/or kV according to patient size and/or use of iterative reconstruction technique. CONTRAST:  171mL OMNIPAQUE IOHEXOL 300 MG/ML  SOLN COMPARISON:  05/14/2022 FINDINGS: Lower chest: No acute abnormality. Hepatobiliary: No focal hepatic abnormality. Gallbladder unremarkable. Pancreas: No focal abnormality or ductal dilatation. Spleen: No focal abnormality.  Normal size. Adrenals/Urinary Tract: Mild fullness of the renal collecting system bilaterally is stable since prior study. No overt hydronephrosis. Small cyst in the midpole of the right kidney is stable. No stones. Adrenal glands and urinary bladder unremarkable. Stomach/Bowel: Normal appendix. Partial colectomy with left lower quadrant ostomy noted. Fall diverticulosis throughout the remaining colon. Distal ileum is decompressed. There are dilated small bowel loops in the left abdomen and into the pelvis, similar to study from 11/27/2021. Given that small bowel proximal and distal to these dilated loops are decompressed, cannot completely exclude a closed loop obstruction. Vascular/Lymphatic: Scattered  aortic atherosclerosis. No evidence of aneurysm or adenopathy. Reproductive: No visible focal abnormality. Other: No free fluid or free air. Musculoskeletal: No acute bony abnormality. IMPRESSION: Prior partial colectomy with left lower quadrant ostomy. Dilated small bowel loops in the left lower abdomen and extending into the pelvis. Small bowel proximal to these loops and distal to these loops are decompressed. This raises the possibility of a closed loop obstruction. However, this finding is similar to study from 11/27/2021. Aortic atherosclerosis. Electronically Signed   By: Rolm Baptise M.D.   On: 05/30/2022 22:26        Scheduled Meds:  [START ON 06/04/2022] aspirin EC  81 mg Oral Once per day on Tue Wed Thu   enoxaparin  (LOVENOX) injection  40 mg Subcutaneous Q24H   insulin aspart  0-9 Units Subcutaneous Q4H   lisinopril  10 mg Oral Daily   metoprolol succinate  50 mg Oral Daily   pantoprazole (PROTONIX) IV  40 mg Intravenous Q0600   rosuvastatin  10 mg Oral Daily   sodium chloride flush  3 mL Intravenous Q12H   Continuous Infusions:  lactated ringers 125 mL/hr at 06/01/22 0711     LOS: 1 day    Time spent: 35 mins     Wyvonnia Dusky, MD [facility] Pager [phone]  If 7PM-7AM, please contact night-coverage [email or web address] 06/01/2022, 7:51 AM

## 2022-06-01 NOTE — Progress Notes (Signed)
Patient ID: Willie [name], male   DOB: 03/19/1940, 82 y.o.   MRN: [id number]     [place] Hospital Day(s): 1.   Interval History: Patient seen and examined, no acute events or new complaints overnight. Patient reports feeling better this morning.  He has been passing gas and stool through the colostomy since yesterday night.  He denies any nausea or vomiting.  The NGT has been clamped overnight.  Denies any abdominal pain.  Vital signs in last 24 hours: [min-max] current  Temp:  [98.6 F (37 C)-98.7 F (37.1 C)] 98.7 F (37.1 C) (03/23 0824) Pulse Rate:  [75-84] 75 (03/23 0824) Resp:  [18-20] 18 (03/23 0824) BP: (151-194)/(75-98) 185/85 (03/23 0824) SpO2:  [94 %-96 %] 95 % (03/23 0824)     Height: 5\' 3"  (160 cm) Weight: 77.8 kg BMI (Calculated): 30.41   Physical Exam:  Constitutional: alert, cooperative and no distress  Respiratory: breathing non-labored at rest  Cardiovascular: regular rate and sinus rhythm  Gastrointestinal: soft, non-tender, and non-distended.  Colostomy is pink and patent with abundant amount of stool and gas in the colostomy bag.  Labs:     Latest Ref Rng & Units 06/01/2022    4:46 AM 05/31/2022    5:00 AM 05/30/2022    9:06 PM  CBC  WBC 4.0 - 10.5 K/uL 4.4  5.6  7.6   Hemoglobin 13.0 - 17.0 g/dL 13.8  14.2  15.9   Hematocrit 39.0 - 52.0 % 41.9  42.5  47.2   Platelets 150 - 400 K/uL 188  204  241       Latest Ref Rng & Units 06/01/2022    4:46 AM 05/31/2022    5:00 AM 05/30/2022    9:06 PM  CMP  Glucose 70 - 99 mg/dL 151  149  160   BUN 8 - 23 mg/dL 13  17  17    Creatinine 0.61 - 1.24 mg/dL 1.15  1.26  1.27   Sodium 135 - 145 mmol/L 135  142  136   Potassium 3.5 - 5.1 mmol/L 4.0  4.3  4.7   Chloride 98 - 111 mmol/L 99  104  101   CO2 22 - 32 mmol/L 24  24  19    Calcium 8.9 - 10.3 mg/dL 8.9  9.3  9.7   Total Protein 6.5 - 8.1 g/dL   8.4   Total Bilirubin 0.3 - 1.2 mg/dL   1.0   Alkaline Phos 38 - 126 U/L   94   AST 15 - 41 U/L   34    ALT 0 - 44 U/L   27     Imaging studies: I personally evaluated the abdominal x-ray that shows oral contrast in the large intestine.  Small loop of small bowel with persistent dilation.   Assessment/Plan:  82 y.o. male with small bowel obstruction  -Clinically improving.  Start passing stool and gas through the colostomy.  Abdomen is soft.  Nondistended.  No abdominal pain.  No nausea or vomiting despite having the NGT clamped overnight. -I will give clear liquid diet trial this morning. -Encourage patient to get out of bed and ambulate -Will follow closely for possible removal NGT later today.  Arnold Long, MD

## 2022-06-02 ENCOUNTER — Encounter: Payer: Self-pay | Admitting: Oncology

## 2022-06-02 DIAGNOSIS — K56609 Unspecified intestinal obstruction, unspecified as to partial versus complete obstruction: Secondary | ICD-10-CM | POA: Diagnosis not present

## 2022-06-02 DIAGNOSIS — I10 Essential (primary) hypertension: Secondary | ICD-10-CM | POA: Diagnosis not present

## 2022-06-02 DIAGNOSIS — N1831 Chronic kidney disease, stage 3a: Secondary | ICD-10-CM | POA: Diagnosis not present

## 2022-06-02 LAB — BASIC METABOLIC PANEL
Anion gap: 3 — ABNORMAL LOW (ref 5–15)
BUN: 11 mg/dL (ref 8–23)
CO2: 25 mmol/L (ref 22–32)
Calcium: 8.5 mg/dL — ABNORMAL LOW (ref 8.9–10.3)
Chloride: 106 mmol/L (ref 98–111)
Creatinine, Ser: 1.13 mg/dL (ref 0.61–1.24)
GFR, Estimated: >60 mL/min (ref >60)
Glucose, Bld: 132 mg/dL — ABNORMAL HIGH (ref 70–99)
Potassium: 3.7 mmol/L (ref 3.5–5.1)
Sodium: 134 mmol/L — ABNORMAL LOW (ref 135–145)

## 2022-06-02 LAB — CBC
HCT: 38.5 % — ABNORMAL LOW (ref 39.0–52.0)
Hemoglobin: 12.8 g/dL — ABNORMAL LOW (ref 13.0–17.0)
MCH: 29.1 pg (ref 26.0–34.0)
MCHC: 33.2 g/dL (ref 30.0–36.0)
MCV: 87.5 fL (ref 80.0–100.0)
Platelets: 165 10*3/uL (ref 150–400)
RBC: 4.4 MIL/uL (ref 4.22–5.81)
RDW: 13.5 % (ref 11.5–15.5)
WBC: 4.2 10*3/uL (ref 4.0–10.5)
nRBC: 0 % (ref 0.0–0.2)

## 2022-06-02 LAB — GLUCOSE, CAPILLARY
Glucose-Capillary: 116 mg/dL — ABNORMAL HIGH (ref 70–99)
Glucose-Capillary: 118 mg/dL — ABNORMAL HIGH (ref 70–99)
Glucose-Capillary: 135 mg/dL — ABNORMAL HIGH (ref 70–99)
Glucose-Capillary: 157 mg/dL — ABNORMAL HIGH (ref 70–99)
Glucose-Capillary: 60 mg/dL — ABNORMAL LOW (ref 70–99)
Glucose-Capillary: 71 mg/dL (ref 70–99)

## 2022-06-02 NOTE — Discharge Summary (Signed)
Physician Discharge Summary  Willie [name] [id number] DOB: Aug 31, 1940 DOA: 05/30/2022  PCP: [name], MD  Admit date: 05/30/2022 Discharge date: 06/02/2022  Admitted From: home  Disposition:  home   Recommendations for Outpatient Follow-up:  Follow up with PCP in 1-2 weeks   Home Health: no Equipment/Devices:  Discharge Condition: stable  CODE STATUS: full  Diet recommendation: Heart Healthy  Brief/Interim Summary: HPI was taken from Dr. Elvia Collum: Willie [name] is a 82 y.o. male with medical history significant of colon surgery with stoma creation for colon cancer in the past.  Patient has had prior episodes of bowel obstruction.  With prior lysis of adhesions.  Patient was in his usual state of health till earlier this a.m. when he reports a new onset of nausea with associated vomiting at least 3 episodes which were nonbloody, several other smaller episodes of vomiting.  Associated with abdominal distention.  Patient does not report any abdominal pain.  No fever.  No diarrhea.  Patient is actually not passed gas or bowel movement since this morning.  No chest pain or shortness of breath no presyncope no palpitations.  Patient has had prior similar symptoms in the past that were diagnosed as bowel obstruction, patient presented to the ER for further evaluation.   ER course is notable for patient having nasogastric tube placement followed by low intermittent suction, patient is feeling pretty good at this time.  As per Dr. Jimmye Norman 3/22-3/24/24: Pt was found to have SBO and was treated w/ NPO, NG tube. The SBO resolved and pt was having adequate ostomy output and having gas prior to d/c. Also, pt was able to tolerate po diet prior to d/c.   Discharge Diagnoses:  Principal Problem:   Small bowel obstruction ([facility]) Active Problems:   SBO (small bowel obstruction) (HCC)   Essential hypertension   Lactic acid acidosis  Likely SBO: resolved. Stool output in colostomy bag. NG  tube came out while pt was eating broth on 06/01/22. 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 Cr is trending down daily   Discharge Instructions  Discharge Instructions     Diet - low sodium heart healthy   Complete by: As directed    Discharge instructions   Complete by: As directed    F/u w/ PCP in 1-2 weeks   Increase activity slowly   Complete by: As directed       Allergies as of 06/02/2022   No Known Allergies      Medication List     TAKE these medications    aspirin EC 81 MG tablet Take 81 mg by mouth 3 (three) times a week. Tues weds, thurs   brimonidine 0.2 % ophthalmic solution Commonly known as: ALPHAGAN Place 1 drop into both eyes 2 (two) times daily.   lisinopril 10 MG tablet Commonly known as: ZESTRIL Take 10 mg by mouth daily.   metoprolol succinate 50 MG 24 hr tablet Commonly known as: TOPROL-XL Take 50 mg by mouth daily.   Netarsudil-Latanoprost 0.02-0.005 % Soln Place 1 drop into both eyes at bedtime.   NovoLOG Mix 70/30 FlexPen (70-30) 100 UNIT/ML FlexPen Generic drug: insulin aspart protamine - aspart Inject 12 Units into the skin 2 (two) times daily with a meal.   pantoprazole 40 MG tablet Commonly known as: PROTONIX Take 1 tablet (40 mg total) by mouth 2 (two) times daily before a meal.  What changed: when to take this   rosuvastatin 10 MG tablet Commonly known as: CRESTOR Take 10 mg by mouth daily.   tamsulosin 0.4 MG Caps capsule Commonly known as: FLOMAX Take 0.4 mg by mouth daily.   traZODone 50 MG tablet Commonly known as: DESYREL Take 50 mg by mouth at bedtime as needed for sleep.        No Known Allergies  Consultations: General surg    Procedures/Studies: DG Abd 2 Views  Result Date: 06/01/2022 CLINICAL DATA:  Small bowel obstruction. EXAM: ABDOMEN - 2 VIEW COMPARISON:  May 31, 2022. FINDINGS:  Nasogastric tube tip is seen in proximal stomach. Stable mildly dilated small bowel loops are noted in left upper quadrant which may represent focal ileus or less likely partial obstruction. Residual contrast is noted in nondilated right colon. IMPRESSION: Stable mildly dilated small bowel loops are noted in left upper quadrant which may represent focal ileus or less likely partial obstruction. Electronically Signed   By: Marijo Conception M.D.   On: 06/01/2022 08:16   DG Abd Portable 1V-Small Bowel Obstruction Protocol-initial, 8 hr delay  Result Date: 05/31/2022 CLINICAL DATA:  "8 hour delayed film" or small bowel obstruction EXAM: PORTABLE ABDOMEN - 1 VIEW COMPARISON:  05/31/2022 at 1 a.m. FINDINGS: A nasogastric tube is in the stomach with tip in the stomach body near the fundus. There is contrast medium in the stomach body and fundus. No substantial contrast in the distal stomach, small bowel, or large bowel. Gas and stool is present in the colon. Small bowel loops in the left abdomen measure up to 3.3 cm in diameter, mildly prominent. IMPRESSION: 1. Mildly dilated small bowel loops in the left abdomen measuring up to 3.3 cm in diameter. 2. Contrast medium in the stomach body and fundus. No substantial contrast in the distal stomach, small bowel, or large bowel. Electronically Signed   By: Van Clines M.D.   On: 05/31/2022 16:18   DG Abdomen 1 View  Result Date: 05/31/2022 CLINICAL DATA:  NG tube placement EXAM: ABDOMEN - 1 VIEW COMPARISON:  05/31/2022 FINDINGS: NG tube is in the stomach.  Visualized lungs clear. IMPRESSION: NG tube in the stomach. Electronically Signed   By: Rolm Baptise M.D.   On: 05/31/2022 01:13   DG Abdomen 1 View  Result Date: 05/31/2022 CLINICAL DATA:  Nasogastric tube placement EXAM: ABDOMEN - 1 VIEW COMPARISON:  11/23/2021 FINDINGS: Nasogastric tube tip overlies the gastric fundus. The abdominal gas pattern is indeterminate due to a paucity of intra-abdominal gas  exclusion of the pelvis. No free intraperitoneal gas. Lung bases are clear. IMPRESSION: 1. Nasogastric tube tip within the gastric fundus. Electronically Signed   By: Fidela Salisbury M.D.   On: 05/31/2022 00:51   CT ABDOMEN PELVIS W CONTRAST  Result Date: 05/30/2022 CLINICAL DATA:  Abdominal pain, acute, nonlocalized. EXAM: CT ABDOMEN AND PELVIS WITH CONTRAST TECHNIQUE: Multidetector CT imaging of the abdomen and pelvis was performed using the standard protocol following bolus administration of intravenous contrast. RADIATION DOSE REDUCTION: This exam was performed according to the departmental dose-optimization program which includes automated exposure control, adjustment of the mA and/or kV according to patient size and/or use of iterative reconstruction technique. CONTRAST:  124mL OMNIPAQUE IOHEXOL 300 MG/ML  SOLN COMPARISON:  05/14/2022 FINDINGS: Lower chest: No acute abnormality. Hepatobiliary: No focal hepatic abnormality. Gallbladder unremarkable. Pancreas: No focal abnormality or ductal dilatation. Spleen: No focal abnormality.  Normal size. Adrenals/Urinary Tract: Mild fullness of the renal collecting system bilaterally  is stable since prior study. No overt hydronephrosis. Small cyst in the midpole of the right kidney is stable. No stones. Adrenal glands and urinary bladder unremarkable. Stomach/Bowel: Normal appendix. Partial colectomy with left lower quadrant ostomy noted. Fall diverticulosis throughout the remaining colon. Distal ileum is decompressed. There are dilated small bowel loops in the left abdomen and into the pelvis, similar to study from 11/27/2021. Given that small bowel proximal and distal to these dilated loops are decompressed, cannot completely exclude a closed loop obstruction. Vascular/Lymphatic: Scattered aortic atherosclerosis. No evidence of aneurysm or adenopathy. Reproductive: No visible focal abnormality. Other: No free fluid or free air. Musculoskeletal: No acute bony  abnormality. IMPRESSION: Prior partial colectomy with left lower quadrant ostomy. Dilated small bowel loops in the left lower abdomen and extending into the pelvis. Small bowel proximal to these loops and distal to these loops are decompressed. This raises the possibility of a closed loop obstruction. However, this finding is similar to study from 11/27/2021. Aortic atherosclerosis. Electronically Signed   By: Rolm Baptise M.D.   On: 05/30/2022 22:26   CT CHEST ABDOMEN PELVIS W CONTRAST  Result Date: 05/15/2022 CLINICAL DATA:  Rectal carcinoma. Recent surgery for lysis of adhesions. Rectal carcinoma surveillance. * Tracking Code: BO * is EXAM: CT CHEST, ABDOMEN, AND PELVIS WITH CONTRAST TECHNIQUE: Multidetector CT imaging of the chest, abdomen and pelvis was performed following the standard protocol during bolus administration of intravenous contrast. RADIATION DOSE REDUCTION: This exam was performed according to the departmental dose-optimization program which includes automated exposure control, adjustment of the mA and/or kV according to patient size and/or use of iterative reconstruction technique. CONTRAST:  156mL OMNIPAQUE IOHEXOL 300 MG/ML  SOLN COMPARISON:  None Available. FINDINGS: CT CHEST FINDINGS Cardiovascular: No significant vascular findings. Normal heart size. No pericardial effusion. Mediastinum/Nodes: No axillary or supraclavicular adenopathy. No mediastinal or hilar adenopathy. No pericardial fluid. Esophagus normal. Lungs/Pleura: No suspicious pulmonary nodules. Normal pleural. Airways normal. Musculoskeletal: No aggressive osseous lesion. CT ABDOMEN AND PELVIS FINDINGS Hepatobiliary: No focal hepatic lesion. No biliary ductal dilatation. Gallbladder is normal. Common bile duct is normal. Pancreas: Pancreas is normal. No ductal dilatation. No pancreatic inflammation. Spleen: Normal spleen. No mesenteric lymphadenopathy.  No pelvic lymphadenopathy. Adrenals/urinary tract: Adrenal glands and  kidneys are normal. The ureters and bladder normal. Stomach/Bowel: .Stomach, duodenum and small bowel normal. Appendix normal. Diverticula ascending colon transverse colon. LEFT abdominal wall colostomy without obstruction. Hartmann's pouch noted. Contrast in the excluded rectum. No suspicious nodularity. Previous described nodularity along the distal most transverse colon adjacent to the ostomy is no longer identified. Vascular/Lymphatic: Abdominal aorta is normal caliber. There is no retroperitoneal or periportal lymphadenopathy. No pelvic lymphadenopathy. Reproductive: Prostatectomy. Other: No peritoneal metastasis. Musculoskeletal: Degenerative cystic change in LEFT and RIGHT acetabulum. Schmorl's nodes noted endplates L3 and L4. No aggressive osseous lesion. IMPRESSION: CHEST IMPRESSION: No evidence of thoracic metastasis. PELVIS IMPRESSION: 1. No evidence of local colorectal carcinoma recurrence. 2. No evidence of metastatic disease in the abdomen pelvis. 3. LEFT abdominal wall colostomy without complication. 4. No bowel obstruction. Electronically Signed   By: Suzy Bouchard M.D.   On: 05/15/2022 15:44   (Echo, Carotid, EGD, Colonoscopy, ERCP)    Subjective: Pt denies any complaints. Pt denies any abd pain, nausea or vomiting    Discharge Exam: Vitals:   06/02/22 0504 06/02/22 0726  BP: (!) 186/82 (!) 169/82  Pulse: 68 66  Resp: 16 16  Temp: 98.9 F (37.2 C) 98.2 F (36.8 C)  SpO2: 100%  100%   Vitals:   06/01/22 1544 06/01/22 1953 06/02/22 0504 06/02/22 0726  BP: (!) 138/113 (!) 152/64 (!) 186/82 (!) 169/82  Pulse: 68 67 68 66  Resp: 16 16 16 16   Temp: 98.9 F (37.2 C) 98.5 F (36.9 C) 98.9 F (37.2 C) 98.2 F (36.8 C)  TempSrc: Oral Oral Oral Oral  SpO2: 97% 99% 100% 100%  Weight:      Height:        General: Pt is alert, awake, not in acute distress Cardiovascular:  S1/S2 +, no rubs, no gallops Respiratory: CTA bilaterally, no wheezing, no rhonchi Abdominal: Soft, NT,  obes, bowel sounds + Extremities: no edema, no cyanosis    The results of significant diagnostics from this hospitalization (including imaging, microbiology, ancillary and laboratory) are listed below for reference.     Microbiology: No results found for this or any previous visit (from the past 240 hour(s)).   Labs: BNP (last 3 results) No results for input(s): "BNP" in the last 8760 hours. Basic Metabolic Panel: Recent Labs  Lab 05/30/22 2106 05/31/22 0500 06/01/22 0446 06/02/22 0457  NA 136 142 135 134*  K 4.7 4.3 4.0 3.7  CL 101 104 99 106  CO2 19* 24 24 25   GLUCOSE 160* 149* 151* 132*  BUN 17 17 13 11   CREATININE 1.27* 1.26* 1.15 1.13  CALCIUM 9.7 9.3 8.9 8.5*   Liver Function Tests: Recent Labs  Lab 05/30/22 2106  AST 34  ALT 27  ALKPHOS 94  BILITOT 1.0  PROT 8.4*  ALBUMIN 3.9   Recent Labs  Lab 05/30/22 2106  LIPASE 25   No results for input(s): "AMMONIA" in the last 168 hours. CBC: Recent Labs  Lab 05/30/22 2106 05/31/22 0500 06/01/22 0446 06/02/22 0457  WBC 7.6 5.6 4.4 4.2  HGB 15.9 14.2 13.8 12.8*  HCT 47.2 42.5 41.9 38.5*  MCV 85.7 86.2 87.1 87.5  PLT 241 204 188 165   Cardiac Enzymes: No results for input(s): "CKTOTAL", "CKMB", "CKMBINDEX", "TROPONINI" in the last 168 hours. BNP: Invalid input(s): "POCBNP" CBG: Recent Labs  Lab 06/02/22 0041 06/02/22 0101 06/02/22 0257 06/02/22 0458 06/02/22 1152  GLUCAP 60* 71 118* 116* 157*   D-Dimer No results for input(s): "DDIMER" in the last 72 hours. Hgb A1c No results for input(s): "HGBA1C" in the last 72 hours. Lipid Profile No results for input(s): "CHOL", "HDL", "LDLCALC", "TRIG", "CHOLHDL", "LDLDIRECT" in the last 72 hours. Thyroid function studies No results for input(s): "TSH", "T4TOTAL", "T3FREE", "THYROIDAB" in the last 72 hours.  Invalid input(s): "FREET3" Anemia work up No results for input(s): "VITAMINB12", "FOLATE", "FERRITIN", "TIBC", "IRON", "RETICCTPCT" in the last  72 hours. Urinalysis    Component Value Date/Time   COLORURINE YELLOW (A) 09/23/2021 0521   APPEARANCEUR HAZY (A) 09/23/2021 0521   LABSPEC 1.020 09/23/2021 0521   PHURINE 5.0 09/23/2021 0521   GLUCOSEU NEGATIVE 09/23/2021 0521   HGBUR SMALL (A) 09/23/2021 0521   BILIRUBINUR NEGATIVE 09/23/2021 0521   KETONESUR 20 (A) 09/23/2021 0521   PROTEINUR 100 (A) 09/23/2021 0521   NITRITE NEGATIVE 09/23/2021 0521   LEUKOCYTESUR NEGATIVE 09/23/2021 0521   Sepsis Labs Recent Labs  Lab 05/30/22 2106 05/31/22 0500 06/01/22 0446 06/02/22 0457  WBC 7.6 5.6 4.4 4.2   Microbiology No results found for this or any previous visit (from the past 240 hour(s)).   Time coordinating discharge: Over 30 minutes  SIGNED:   Wyvonnia Dusky, MD  [facility] 06/02/2022, 12:03 PM Pager  If 7PM-7AM, please contact night-coverage [email or web address]

## 2022-06-02 NOTE — Progress Notes (Signed)
Hypoglycemic Event  CBG: 61  Treatment: 4 oz juice/soda  Symptoms: Shaky  Follow-up CBG: [id number] CBG Result:71  Possible Reasons for Event: Inadequate meal intake  Comments/MD notified:Provider advised to recheck cbg again in 30 min then 1 h.    Romie Minus

## 2022-06-02 NOTE — Progress Notes (Signed)
Patient ID: Willie [name], male   DOB: [birth year], 82 y.o.   MRN: [id number]     [facility] Day(s): 2.   Interval History: Patient seen and examined, no acute events or new complaints overnight. Patient reports feeling great this morning.  He endorses that the bag has been changed multiple times.  He continue passing gas and having ostomy output.  He denies any nausea or vomiting with the liquids.  He also had some soft diet for breakfast this morning and no issues.  Vital signs in last 24 hours: [min-max] current  Temp:  [98.2 F (36.8 C)-98.9 F (37.2 C)] 98.2 F (36.8 C) (03/24 0726) Pulse Rate:  [66-68] 66 (03/24 0726) Resp:  [16] 16 (03/24 0726) BP: (138-186)/(64-113) 169/82 (03/24 0726) SpO2:  [97 %-100 %] 100 % (03/24 0726) FiO2 (%):  [21 %] 21 % (03/23 1544)     Height: 5\' 3"  (160 cm) Weight: 77.8 kg BMI (Calculated): 30.41   Physical Exam:  Constitutional: alert, cooperative and no distress  Respiratory: breathing non-labored at rest  Cardiovascular: regular rate and sinus rhythm  Gastrointestinal: soft, non-tender, and non-distended.  Colostomy is pink and patent.  Labs:     Latest Ref Rng & Units 06/02/2022    4:57 AM 06/01/2022    4:46 AM 05/31/2022    5:00 AM  CBC  WBC 4.0 - 10.5 K/uL 4.2  4.4  5.6   Hemoglobin 13.0 - 17.0 g/dL 12.8  13.8  14.2   Hematocrit 39.0 - 52.0 % 38.5  41.9  42.5   Platelets 150 - 400 K/uL 165  188  204       Latest Ref Rng & Units 06/02/2022    4:57 AM 06/01/2022    4:46 AM 05/31/2022    5:00 AM  CMP  Glucose 70 - 99 mg/dL 132  151  149   BUN 8 - 23 mg/dL 11  13  17    Creatinine 0.61 - 1.24 mg/dL 1.13  1.15  1.26   Sodium 135 - 145 mmol/L 134  135  142   Potassium 3.5 - 5.1 mmol/L 3.7  4.0  4.3   Chloride 98 - 111 mmol/L 106  99  104   CO2 22 - 32 mmol/L 25  24  24    Calcium 8.9 - 10.3 mg/dL 8.5  8.9  9.3     Imaging studies: No new pertinent imaging studies   Assessment/Plan:  82 y.o. male with small  bowel obstruction   -Clinically the obstruction has resolved.  Patient continue having adequate ostomy output.  He has tolerated up to soft diet.  Continue passing gas.  Multiple changes of ostomy bag. -Contraindication to discharge home from surgical standpoint -No need for surgical follow-up as outpatient.  Follow-up with primary care physician.  Arnold Long, MD

## 2022-06-02 NOTE — Progress Notes (Signed)
0130 CBG not pulling over results into pt chart. 0130 CBG completed by writer, bg 100.

## 2022-06-03 LAB — GLUCOSE, CAPILLARY: Glucose-Capillary: 100 mg/dL — ABNORMAL HIGH (ref 70–99)

## 2022-06-13 ENCOUNTER — Encounter: Payer: Self-pay | Admitting: Oncology

## 2022-11-29 ENCOUNTER — Inpatient Hospital Stay: Payer: Medicare PPO | Attending: Radiation Oncology

## 2022-11-29 ENCOUNTER — Inpatient Hospital Stay: Payer: Medicare PPO | Admitting: Oncology

## 2022-11-29 ENCOUNTER — Encounter: Payer: Self-pay | Admitting: Oncology

## 2022-11-29 VITALS — BP 110/88 | HR 68 | Temp 97.6°F | Resp 20 | Wt 171.0 lb

## 2022-11-29 DIAGNOSIS — Z85048 Personal history of other malignant neoplasm of rectum, rectosigmoid junction, and anus: Secondary | ICD-10-CM | POA: Diagnosis not present

## 2022-11-29 DIAGNOSIS — Z8041 Family history of malignant neoplasm of ovary: Secondary | ICD-10-CM | POA: Diagnosis not present

## 2022-11-29 DIAGNOSIS — Z08 Encounter for follow-up examination after completed treatment for malignant neoplasm: Secondary | ICD-10-CM

## 2022-11-29 DIAGNOSIS — Z8546 Personal history of malignant neoplasm of prostate: Secondary | ICD-10-CM | POA: Diagnosis not present

## 2022-11-29 DIAGNOSIS — Z9221 Personal history of antineoplastic chemotherapy: Secondary | ICD-10-CM | POA: Diagnosis not present

## 2022-11-29 DIAGNOSIS — Z923 Personal history of irradiation: Secondary | ICD-10-CM | POA: Insufficient documentation

## 2022-11-29 LAB — CBC WITH DIFFERENTIAL (CANCER CENTER ONLY)
Abs Immature Granulocytes: 0.02 10*3/uL (ref 0.00–0.07)
Basophils Absolute: 0 10*3/uL (ref 0.0–0.1)
Basophils Relative: 1 %
Eosinophils Absolute: 0.2 10*3/uL (ref 0.0–0.5)
Eosinophils Relative: 4 %
HCT: 37.2 % — ABNORMAL LOW (ref 39.0–52.0)
Hemoglobin: 12.2 g/dL — ABNORMAL LOW (ref 13.0–17.0)
Immature Granulocytes: 0 %
Lymphocytes Relative: 26 %
Lymphs Abs: 1.2 10*3/uL (ref 0.7–4.0)
MCH: 29.3 pg (ref 26.0–34.0)
MCHC: 32.8 g/dL (ref 30.0–36.0)
MCV: 89.4 fL (ref 80.0–100.0)
Monocytes Absolute: 0.4 10*3/uL (ref 0.1–1.0)
Monocytes Relative: 9 %
Neutro Abs: 2.7 10*3/uL (ref 1.7–7.7)
Neutrophils Relative %: 60 %
Platelet Count: 194 10*3/uL (ref 150–400)
RBC: 4.16 MIL/uL — ABNORMAL LOW (ref 4.22–5.81)
RDW: 13.9 % (ref 11.5–15.5)
WBC Count: 4.5 10*3/uL (ref 4.0–10.5)
nRBC: 0 % (ref 0.0–0.2)

## 2022-11-29 LAB — CMP (CANCER CENTER ONLY)
ALT: 30 U/L (ref 0–44)
AST: 23 U/L (ref 15–41)
Albumin: 3.4 g/dL — ABNORMAL LOW (ref 3.5–5.0)
Alkaline Phosphatase: 80 U/L (ref 38–126)
Anion gap: 6 (ref 5–15)
BUN: 19 mg/dL (ref 8–23)
CO2: 25 mmol/L (ref 22–32)
Calcium: 8.8 mg/dL — ABNORMAL LOW (ref 8.9–10.3)
Chloride: 104 mmol/L (ref 98–111)
Creatinine: 1.31 mg/dL — ABNORMAL HIGH (ref 0.61–1.24)
GFR, Estimated: 54 mL/min — ABNORMAL LOW (ref >60)
Glucose, Bld: 257 mg/dL — ABNORMAL HIGH (ref 70–99)
Potassium: 4.5 mmol/L (ref 3.5–5.1)
Sodium: 135 mmol/L (ref 135–145)
Total Bilirubin: 0.4 mg/dL (ref 0.3–1.2)
Total Protein: 7.2 g/dL (ref 6.5–8.1)

## 2022-11-29 NOTE — Progress Notes (Unsigned)
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  He was recently found to have iron deficiency anemia which led to a colonoscopy On 07/15/2018.  Colonoscopy showed a villous partially obstructing medium-sized mass in the mid sigmoid colon.  The mass was partially circumferential measuring 5 cm.  There was another infiltrative partially obstructing medium-sized mass found in the rectosigmoid colon which also measured 5 cm.  Pathology from the sigmoid colon mass showed high-grade dysplasia at least involving an adenomatous lesion with villous features.  A more serious process is not excluded.  Rectosigmoid mass biopsy showed invasive colorectal adenocarcinoma.   Patient was referred to Dr. Maia Plan for surgical management.  Patient had a CT abdomen and pelvis with contrast which showed irregular asymmetric mural thickening of the proximal rectum measuring over 6 to 7 cm.  Potential local nodal metastases with several subcentimeter iliac lymph nodes and presacral lymph nodes.  No evidence of metastatic disease outside the pelvis.   Patient also had MRI of the pelvis with and without contrast which showed tumor length 8.5 cm with extension through muscularis propria  decreased C.  There is diffuse involvement of muscularis propria.  No extramural vascular invasion or tumor thrombus.  Shortest distance of any tumor/note from mesorectal fascia 1 to 2 mm.  No involvement of adjacent organs or pelvic sidewall.  Mesorectal lymph nodes within perirectal fat including 6 mm node and a low sigmoid mesocolon node measuring 6 mm.  Another 6 mm node within the low sigmoid mesocolon representing extremity rectal lymphadenopathy.  T3N2 by MRI.  Distance from tumor to internal anal sphincter is 6.2 cm.   Patient completed 8 cycles of neoadjuvant FOLFOX chemotherapy on 12/07/2018.  Interim scans showed similar-appearing irregular wall thickening of the rectum but no retroperitoneal pelvic adenopathy.  Patient completed concurrent chemoradiation end of November 2020.   Patient had partial colectomy with a colostomy at [facility] in March 2021.  Final pathology showed invasive adenocarcinoma 6 cm grade 2.  Tumor deposit present.  Tumor invades through muscularis propria into pericolorectal tissue.  Lymphovascular invasion present.  Perineural invasion absent.  Treatment effect absent.  Extensive residual cancer with no evident tumor regression.  Poor to no response.  Margins negative.  1 out of 39 lymph nodes positive.  yPT3 pN1a.  He is currently in remission  Interval history-patient is doing well presently.  Denies any trouble with colostomy.  Bowel movements are regular.  Denies any abdominal pain or blood in stools.  ECOG PS- 1 Pain scale- 0   Review of systems- Review of Systems  Constitutional:  Negative for chills, fever, malaise/fatigue and weight loss.  HENT:  Negative for congestion, ear discharge and nosebleeds.   Eyes:  Negative for blurred vision.  Respiratory:  Negative for cough, hemoptysis, sputum production, shortness of breath and wheezing.   Cardiovascular:  Negative for  chest pain, palpitations, orthopnea and claudication.  Gastrointestinal:  Negative for abdominal  pain, blood in stool, constipation, diarrhea, heartburn, melena, nausea and vomiting.  Genitourinary:  Negative for dysuria, flank pain, frequency, hematuria and urgency.  Musculoskeletal:  Negative for back pain, joint pain and myalgias.  Skin:  Negative for rash.  Neurological:  Negative for dizziness, tingling, focal weakness, seizures, weakness and headaches.  Endo/Heme/Allergies:  Does not bruise/bleed easily.  Psychiatric/Behavioral:  Negative for depression and suicidal ideas. The patient does not have insomnia.       No Known Allergies   Past Medical History:  Diagnosis Date   Anemia    Bowel obstruction (HCC)    Coronary artery disease    Diabetes mellitus without complication (HCC)    GERD (gastroesophageal reflux disease)    History of hiatal hernia    Hypercholesterolemia    Hypertension    Prostate cancer (HCC) 2000   Prostatectomy.    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HCC) 09/2018   Chemo tx's.    Wears dentures    partial lower     Past Surgical History:  Procedure Laterality Date   CATARACT EXTRACTION W/PHACO Right 10/12/2019   Procedure: CATARACT EXTRACTION PHACO AND INTRAOCULAR LENS PLACEMENT (IOC) RIGHT DIABETIC 6.35 00:37.1;  Surgeon: Galen Manila, MD;  Location: [facility];  Service: Ophthalmology;  Laterality: Right;   COLONOSCOPY N/A 05/02/2021   Procedure: COLONOSCOPY;  Surgeon: [name], Boykin Nearing, MD;  Location: [facility] ENDOSCOPY;  Service: Gastroenterology;  Laterality: N/A;  IDDM   COLONOSCOPY WITH PROPOFOL N/A 07/15/2018   Procedure: COLONOSCOPY WITH PROPOFOL;  Surgeon: [name], Boykin Nearing, MD;  Location: [facility] ENDOSCOPY;  Service: Gastroenterology;  Laterality: N/A;   COLOSTOMY Left    ESOPHAGOGASTRODUODENOSCOPY N/A 10/12/2018   Procedure: ESOPHAGOGASTRODUODENOSCOPY (EGD);  Surgeon: Toney Reil, MD;  Location: [facility] ENDOSCOPY;  Service: Gastroenterology;  Laterality: N/A;   ESOPHAGOGASTRODUODENOSCOPY (EGD) WITH PROPOFOL N/A 07/15/2018   Procedure:  ESOPHAGOGASTRODUODENOSCOPY (EGD) WITH PROPOFOL;  Surgeon: [name], Boykin Nearing, MD;  Location: [facility] ENDOSCOPY;  Service: Gastroenterology;  Laterality: N/A;   PORTACATH PLACEMENT N/A 08/13/2018   Procedure: INSERTION PORT-A-CATH;  Surgeon: Carolan Shiver, MD;  Location: [facility] ORS;  Service: General;  Laterality: N/A;   PROSTATE SURGERY     ROBOT ASSISTED LAPAROSCOPIC PARTIAL COLECTOMY  05/12/2019   Duk   XI ROBOT ASSISTED DIAGNOSTIC LAPAROSCOPY N/A 11/23/2021   Procedure: XI ROBOT ASSISTED DIAGNOSTIC LAPAROSCOPY;  Surgeon: Carolan Shiver, MD;  Location: [facility] ORS;  Service: General;  Laterality: N/A;    Social History   Socioeconomic History   Marital status: Widowed    Spouse name: Not on file   Number of children: 0   Years of education: Not on file   Highest education level: Not on file  Occupational History   Occupation: Runner, broadcasting/film/video    Comment: Retired  Tobacco Use   Smoking status: Never   Smokeless tobacco: Never  Vaping Use   Vaping status: Never Used  Substance and Sexual Activity   Alcohol use: Not Currently   Drug use: Never   Sexual activity: Not Currently  Other Topics Concern   Not on file  Social History Narrative   Patient is retired Electrical engineer.  He was widowed approximately 1 year ago (2019).  His wife was a Editor, commissioning and they traveled frequently.  Several nieces and nephews.  He is the youngest of his brothers and sisters and only surviving.   Social Determinants of Corporate investment banker  Strain: Low Risk  (11/01/2017)   Overall Financial Resource Strain (CARDIA)    Difficulty of Paying Living Expenses: Not hard at all  Food Insecurity: No Food Insecurity (05/31/2022)   Hunger Vital Sign    Worried About Running Out of Food in the Last Year: Never true    Ran Out of Food in the Last Year: Never true  Transportation Needs: No Transportation Needs (05/31/2022)   PRAPARE - Therapist, art (Medical): No    Lack of Transportation (Non-Medical): No  Physical Activity: Insufficiently Active (11/01/2017)   Exercise Vital Sign    Days of Exercise per Week: 2 days    Minutes of Exercise per Session: 30 min  Stress: No Stress Concern Present (11/01/2017)   [employer] of Occupational Health - Occupational Stress Questionnaire    Feeling of Stress : Only a little  Social Connections: Unknown (08/31/2018)   Social Connection and Isolation Panel [NHANES]    Frequency of Communication with Friends and Family: More than three times a week    Frequency of Social Gatherings with Friends and Family: More than three times a week    Attends Religious Services: Not on file    Active Member of Clubs or Organizations: Not on file    Attends Banker Meetings: Not on file    Marital Status: Not on file  Intimate Partner Violence: Not At Risk (05/31/2022)   Humiliation, Afraid, Rape, and Kick questionnaire    Fear of Current or Ex-Partner: No    Emotionally Abused: No    Physically Abused: No    Sexually Abused: No    Family History  Problem Relation Age of Onset   Diabetes Mother    Ovarian cancer Sister      Current Outpatient Medications:    aspirin EC 81 MG tablet, Take 81 mg by mouth 3 (three) times a week. Tues weds, thurs, Disp: , Rfl:    brimonidine (ALPHAGAN) 0.2 % ophthalmic solution, Place 1 drop into both eyes 2 (two) times daily., Disp: , Rfl:    lisinopril (ZESTRIL) 10 MG tablet, Take 10 mg by mouth daily., Disp: , Rfl:    metoprolol succinate (TOPROL-XL) 50 MG 24 hr tablet, Take 50 mg by mouth daily., Disp: , Rfl:    Netarsudil-Latanoprost 0.02-0.005 % SOLN, Place 1 drop into both eyes at bedtime., Disp: , Rfl:    NOVOLOG MIX 70/30 FLEXPEN (70-30) 100 UNIT/ML FlexPen, Inject 12 Units into the skin 2 (two) times daily with a meal., Disp: , Rfl:    pantoprazole (PROTONIX) 40 MG tablet, Take 1 tablet (40 mg total) by mouth 2 (two)  times daily before a meal. (Patient taking differently: Take 40 mg by mouth daily.), Disp: 60 tablet, Rfl: 0   rosuvastatin (CRESTOR) 10 MG tablet, Take 10 mg by mouth daily., Disp: , Rfl:    tamsulosin (FLOMAX) 0.4 MG CAPS capsule, Take 0.4 mg by mouth daily., Disp: , Rfl:    traZODone (DESYREL) 50 MG tablet, Take 50 mg by mouth at bedtime as needed for sleep. , Disp: , Rfl:  No current facility-administered medications for this visit.  Facility-Administered Medications Ordered in Other Visits:    heparin lock flush 100 unit/mL, 500 Units, Intravenous, Once, Creig Hines, MD   sodium chloride flush (NS) 0.9 % injection 10 mL, 10 mL, Intravenous, Once, Creig Hines, MD   sodium chloride flush (NS) 0.9 % injection 10 mL, 10 mL, Intravenous, Once, Owens Shark  C, MD   sodium chloride flush (NS) 0.9 % injection 10 mL, 10 mL, Intravenous, PRN, Creig Hines, MD  Physical exam:  Vitals:   11/29/22 1418  BP: 110/88  Pulse: 68  Resp: 20  Temp: 97.6 F (36.4 C)  SpO2: 100%  Weight: 171 lb (77.6 kg)   Physical Exam Cardiovascular:     Rate and Rhythm: Normal rate and regular rhythm.     Heart sounds: Normal heart sounds.  Pulmonary:     Effort: Pulmonary effort is normal.     Breath sounds: Normal breath sounds.  Abdominal:     General: Bowel sounds are normal.     Palpations: Abdomen is soft.     Comments: Colostomy in place  Skin:    General: Skin is warm and dry.  Neurological:     Mental Status: He is alert and oriented to person, place, and time.         Latest Ref Rng & Units 06/02/2022    4:57 AM  CMP  Glucose 70 - 99 mg/dL 161   BUN 8 - 23 mg/dL 11   Creatinine 0.96 - 1.24 mg/dL 0.45   Sodium 409 - 811 mmol/L 134   Potassium 3.5 - 5.1 mmol/L 3.7   Chloride 98 - 111 mmol/L 106   CO2 22 - 32 mmol/L 25   Calcium 8.9 - 10.3 mg/dL 8.5       Latest Ref Rng & Units 11/29/2022    1:54 PM  CBC  WBC 4.0 - 10.5 K/uL 4.5   Hemoglobin 13.0 - 17.0 g/dL 91.4    Hematocrit 78.2 - 52.0 % 37.2   Platelets 150 - 400 K/uL 194     Assessment and plan- Patient is a 82 y.o. male  here for routine surveillance of rectal cancer  Clinically patient doing well with no concerning signs and symptoms of recurrence based on today's exam.  His last scan was in March 2024 which did not show any evidence of recurrent disease.  I will plan to now get scans on a yearly basis.  I will see him back in March 2025 with CT scan CBC with differential CMP CEA prior   Visit Diagnosis 1. Encounter for follow-up surveillance of rectal cancer      Dr. Owens Shark, MD, MPH [facility] [phone] 11/29/2022 2:25 PM

## 2022-11-30 ENCOUNTER — Encounter: Payer: Self-pay | Admitting: Oncology

## 2022-11-30 LAB — CEA: CEA: <0.6 ng/mL (ref 0.0–4.7)

## 2023-02-19 ENCOUNTER — Telehealth: Payer: Self-pay | Admitting: *Deleted

## 2023-02-19 NOTE — Telephone Encounter (Signed)
Call ostomy clinic at [facility] or go to [facility] ER

## 2023-02-19 NOTE — Telephone Encounter (Signed)
Call returned to patient and advised per Dr Danella Penton to call Ostomy Clinic at [facility] or go to ER at [facility]. Son got on line and stated that he had called ostomy clinic this morning and was told to call onc since the wound he had was healed , so I suggested that he go to ER at [facility] stated he would call ostomy clinic back and if needed take patient top ER at [facility]

## 2023-02-19 NOTE — Telephone Encounter (Signed)
Patient called reporting that he started having dark red bleeding without clots from his ostomy Sunday. He said it was flowing pretty good for a while and he used a cold pack on it and it stopped but started again yesterday. Willie [name] again has stopped. He reports that he also had abdominal cramping with it and that his stomach still does not feel right. He denies abdominal distention but did admit that he had had constipation earlier. Please advise.

## 2023-02-20 ENCOUNTER — Other Ambulatory Visit: Payer: Self-pay | Admitting: Internal Medicine

## 2023-02-24 ENCOUNTER — Encounter: Payer: Self-pay | Admitting: Internal Medicine

## 2023-02-25 ENCOUNTER — Encounter
Admission: RE | Disposition: A | Discharge status: Home / Self Care | Payer: Self-pay | Source: Home / Self Care | Attending: Internal Medicine

## 2023-02-25 ENCOUNTER — Ambulatory Visit: Payer: Medicare PPO | Admitting: Certified Registered Nurse Anesthetist

## 2023-02-25 ENCOUNTER — Other Ambulatory Visit: Payer: Self-pay

## 2023-02-25 ENCOUNTER — Ambulatory Visit
Admission: RE | Admit: 2023-02-25 | Discharge: 2023-02-25 | Disposition: A | Discharge status: Home / Self Care | Payer: Medicare PPO | Attending: Internal Medicine | Admitting: Internal Medicine

## 2023-02-25 ENCOUNTER — Encounter: Payer: Self-pay | Admitting: Internal Medicine

## 2023-02-25 DIAGNOSIS — Z1211 Encounter for screening for malignant neoplasm of colon: Secondary | ICD-10-CM | POA: Insufficient documentation

## 2023-02-25 DIAGNOSIS — Z931 Gastrostomy status: Secondary | ICD-10-CM | POA: Insufficient documentation

## 2023-02-25 DIAGNOSIS — K5730 Diverticulosis of large intestine without perforation or abscess without bleeding: Secondary | ICD-10-CM | POA: Diagnosis not present

## 2023-02-25 DIAGNOSIS — K219 Gastro-esophageal reflux disease without esophagitis: Secondary | ICD-10-CM | POA: Insufficient documentation

## 2023-02-25 DIAGNOSIS — Z85048 Personal history of other malignant neoplasm of rectum, rectosigmoid junction, and anus: Secondary | ICD-10-CM | POA: Insufficient documentation

## 2023-02-25 DIAGNOSIS — Z9221 Personal history of antineoplastic chemotherapy: Secondary | ICD-10-CM | POA: Insufficient documentation

## 2023-02-25 DIAGNOSIS — E119 Type 2 diabetes mellitus without complications: Secondary | ICD-10-CM | POA: Diagnosis not present

## 2023-02-25 DIAGNOSIS — I10 Essential (primary) hypertension: Secondary | ICD-10-CM | POA: Diagnosis not present

## 2023-02-25 DIAGNOSIS — Z8546 Personal history of malignant neoplasm of prostate: Secondary | ICD-10-CM | POA: Insufficient documentation

## 2023-02-25 DIAGNOSIS — I2510 Atherosclerotic heart disease of native coronary artery without angina pectoris: Secondary | ICD-10-CM | POA: Diagnosis not present

## 2023-02-25 HISTORY — PX: COLONOSCOPY: SHX5424

## 2023-02-25 LAB — GLUCOSE, CAPILLARY: Glucose-Capillary: 138 mg/dL — ABNORMAL HIGH (ref 70–99)

## 2023-02-25 SURGERY — COLONOSCOPY
Anesthesia: General

## 2023-02-25 MED ORDER — PROPOFOL 10 MG/ML IV BOLUS
INTRAVENOUS | Status: DC | PRN
  Administered 2023-02-25: 50 mg via INTRAVENOUS

## 2023-02-25 MED ORDER — PROPOFOL 500 MG/50ML IV EMUL
INTRAVENOUS | Status: DC | PRN
  Administered 2023-02-25: 120 ug/kg/min via INTRAVENOUS

## 2023-02-25 MED ORDER — SODIUM CHLORIDE 0.9 % IV SOLN
INTRAVENOUS | Status: DC
Start: 2023-02-25 — End: 2023-02-25

## 2023-02-25 NOTE — Transfer of Care (Signed)
Immediate Anesthesia Transfer of Care Note  Patient: Willie [name]  Procedure(s) Performed: COLONOSCOPY  Patient Location: PACU  Anesthesia Type:General  Level of Consciousness: drowsy  Airway & Oxygen Therapy: Patient Spontanous Breathing  Post-op Assessment: Report given to RN and Post -op Vital signs reviewed and stable  Post vital signs: Reviewed and stable  Last Vitals:  Vitals Value Taken Time  BP 114/58 02/25/23 0902  Temp 36.4 C 02/25/23 0902  Pulse 68 02/25/23 0903  Resp 22 02/25/23 0903  SpO2 99 % 02/25/23 0903  Vitals shown include unfiled device data.  Last Pain:  Vitals:   02/25/23 0902  TempSrc: Temporal  PainSc: 0-No pain         Complications: No notable events documented.

## 2023-02-25 NOTE — Interval H&P Note (Signed)
History and Physical Interval Note:  02/25/2023 8:43 AM  Willie [name]  has presented today for surgery, with the diagnosis of History of cancer of rectosigmoid junction (Z85.048).  The various methods of treatment have been discussed with the patient and family. After consideration of risks, benefits and other options for treatment, the patient has consented to  Procedure(s): COLONOSCOPY (N/A) as a surgical intervention.  The patient's history has been reviewed, patient examined, no change in status, stable for surgery.  I have reviewed the patient's chart and labs.  Questions were answered to the patient's satisfaction.     [place], [place]

## 2023-02-25 NOTE — Anesthesia Procedure Notes (Signed)
Date/Time: 02/25/2023 8:45 AM  Performed by: Malva Cogan, CRNAPre-anesthesia Checklist: Patient identified, Emergency Drugs available, Suction available, Patient being monitored and Timeout performed Patient Re-evaluated:Patient Re-evaluated prior to induction Oxygen Delivery Method: Nasal cannula Induction Type: IV induction Placement Confirmation: CO2 detector and positive ETCO2

## 2023-02-25 NOTE — H&P (Signed)
Outpatient short stay form Pre-procedure 02/25/2023 8:42 AM Glenn Christo K. Norma Fredrickson, M.D.  Primary Physician: Marcelino Duster, M.D.  Reason for visit:  Hx rectosigmoid cancer  History of present illness:  Mr. Watry is established with [facility] for the management of the above. All prior office notes, lab results, imaging studies, and procedure reports reviewed as appropriate.  Summary of evaluation: - Labs: - CSY: 05/02/2021 - through colostomy....diverticulosis and small benign polyp removed. - EGD:  GI Medications: Current:  Prior:  Interval History   Mr. Tirey Mr. Mccamish presents to schedule surveillance colonoscopy with personal history of invasive moderately differentiated adenocarcinoma the sigmoid colon in May 2020. Patient ultimately went to [facility] and had a robotic splenic flexure mobilization with colostomy after robotic sigmoidectomy and robotic proctectomy in May 11, 2019. Patient underwent preoperative chemoradiation in 2020.  Doing well, except he had some minor trauma to the ostomy site while moving furniture a few days ago. This resulted in some self-limited bleeding from the ostomy itself, which has now resolved.     Current Facility-Administered Medications:    0.9 %  sodium chloride infusion, , Intravenous, Continuous, [place], Boykin Nearing, MD, Last Rate: 20 mL/hr at 02/25/23 1610, Continued from Pre-op at 02/25/23 0802  Facility-Administered Medications Ordered in Other Encounters:    heparin lock flush 100 unit/mL, 500 Units, Intravenous, Once, Creig Hines, MD   sodium chloride flush (NS) 0.9 % injection 10 mL, 10 mL, Intravenous, Once, Creig Hines, MD   sodium chloride flush (NS) 0.9 % injection 10 mL, 10 mL, Intravenous, Once, Creig Hines, MD   sodium chloride flush (NS) 0.9 % injection 10 mL, 10 mL, Intravenous, PRN, Creig Hines, MD  Medications Prior to Admission  Medication Sig Dispense Refill Last Dose/Taking    aspirin EC 81 MG tablet Take 81 mg by mouth 3 (three) times a week. Tues weds, thurs   02/24/2023   lisinopril (ZESTRIL) 10 MG tablet Take 10 mg by mouth daily.   02/25/2023 at  2:00 AM   metoprolol succinate (TOPROL-XL) 50 MG 24 hr tablet Take 50 mg by mouth daily.   02/25/2023 at  2:00 AM   NOVOLOG MIX 70/30 FLEXPEN (70-30) 100 UNIT/ML FlexPen Inject 12 Units into the skin 2 (two) times daily with a meal.   Past Week   rosuvastatin (CRESTOR) 10 MG tablet Take 10 mg by mouth daily.   02/24/2023   tamsulosin (FLOMAX) 0.4 MG CAPS capsule Take 0.4 mg by mouth daily.   02/24/2023   traZODone (DESYREL) 50 MG tablet Take 50 mg by mouth at bedtime as needed for sleep.    Past Week   brimonidine (ALPHAGAN) 0.2 % ophthalmic solution Place 1 drop into both eyes 2 (two) times daily.      Netarsudil-Latanoprost 0.02-0.005 % SOLN Place 1 drop into both eyes at bedtime.      pantoprazole (PROTONIX) 40 MG tablet Take 1 tablet (40 mg total) by mouth 2 (two) times daily before a meal. (Patient taking differently: Take 40 mg by mouth daily.) 60 tablet 0      No Known Allergies   Past Medical History:  Diagnosis Date   Anemia    Bowel obstruction (HCC)    Coronary artery disease    Diabetes mellitus without complication (HCC)    GERD (gastroesophageal reflux disease)    History of hiatal hernia    Hypercholesterolemia    Hypertension    Prostate cancer (HCC) 2000   Prostatectomy.  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HCC) 09/2018   Chemo tx's.    Wears dentures    partial lower    Review of systems:  Otherwise negative.    Physical Exam  Gen: Alert, oriented. Appears stated age.  HEENT: [place]/AT. PERRLA. Lungs: CTA, no wheezes. CV: RR nl S1, S2. Abd: soft, benign, no masses. BS+ Ext: No edema. Pulses 2+    Planned procedures: Proceed with colonoscopy. The patient understands the nature of the planned procedure, indications, risks, alternatives and potential complications including but not limited to bleeding,  infection, perforation, damage to internal organs and possible oversedation/side effects from anesthesia. The patient agrees and gives consent to proceed.  Please refer to procedure notes for findings, recommendations and patient disposition/instructions.     Kayven Aldaco K. Norma Fredrickson, M.D. Gastroenterology 02/25/2023  8:42 AM

## 2023-02-25 NOTE — Anesthesia Preprocedure Evaluation (Signed)
Anesthesia Evaluation  Patient identified by MRN, date of birth, ID band Patient awake    Reviewed: Allergy & Precautions, NPO status , Patient's Chart, lab work & pertinent test results  History of Anesthesia Complications Negative for: history of anesthetic complications  Airway Mallampati: III  TM Distance: >3 FB Neck ROM: full    Dental  (+) Chipped, Poor Dentition, Missing, Dental Advidsory Given   Pulmonary neg pulmonary ROS, neg shortness of breath, neg recent URI   Pulmonary exam normal        Cardiovascular Exercise Tolerance: Good hypertension, (-) angina + CAD and + Cardiac Stents  (-) Past MI and (-) CABG (-) dysrhythmias (-) Valvular Problems/Murmurs Rhythm:irregular Rate:Tachycardia     Neuro/Psych negative neurological ROS  negative psych ROS   GI/Hepatic Neg liver ROS, hiatal hernia,GERD  Controlled,,  Endo/Other  diabetes, Type 2    Renal/GU Renal disease     Musculoskeletal   Abdominal   Peds  Hematology negative hematology ROS (+)   Anesthesia Other Findings Past Medical History: No date: Anemia No date: Coronary artery disease No date: Diabetes mellitus without complication (HCC) No date: GERD (gastroesophageal reflux disease) No date: History of hiatal hernia No date: Hypercholesterolemia No date: Hypertension 2000: Prostate cancer (HCC)     Comment:  Prostatectomy.  09/2018: 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HCC)     Comment:  Chemo tx's.  No date: Wears dentures     Comment:  partial lower  Past Surgical History: 10/12/2019: CATARACT EXTRACTION W/PHACO; Right     Comment:  Procedure: CATARACT EXTRACTION PHACO AND INTRAOCULAR               LENS PLACEMENT (IOC) RIGHT DIABETIC 6.35 00:37.1;                Surgeon: Galen Manila, MD;  Location: [facility]              [facility];  Service: Ophthalmology;  Laterality: Right; 05/02/2021: COLONOSCOPY; N/A     Comment:  Procedure: COLONOSCOPY;  Surgeon:  [name], Boykin Nearing, MD;              Location: [facility] ENDOSCOPY;  Service: Gastroenterology;                Laterality: N/A;  IDDM 07/15/2018: COLONOSCOPY WITH PROPOFOL; N/A     Comment:  Procedure: COLONOSCOPY WITH PROPOFOL;  Surgeon: [name],               Boykin Nearing, MD;  Location: [facility] ENDOSCOPY;  Service:               Gastroenterology;  Laterality: N/A; 10/12/2018: ESOPHAGOGASTRODUODENOSCOPY; N/A     Comment:  Procedure: ESOPHAGOGASTRODUODENOSCOPY (EGD);  Surgeon:               Toney Reil, MD;  Location: [facility] ENDOSCOPY;                Service: Gastroenterology;  Laterality: N/A; 07/15/2018: ESOPHAGOGASTRODUODENOSCOPY (EGD) WITH PROPOFOL; N/A     Comment:  Procedure: ESOPHAGOGASTRODUODENOSCOPY (EGD) WITH               PROPOFOL;  Surgeon: [name], Boykin Nearing, MD;  Location:               [facility] ENDOSCOPY;  Service: Gastroenterology;  Laterality:               N/A; 08/13/2018: PORTACATH PLACEMENT; N/A     Comment:  Procedure: INSERTION PORT-A-CATH;  Surgeon:  Carolan Shiver, MD;  Location: [facility] ORS;  Service:              General;  Laterality: N/A; No date: PROSTATE SURGERY 05/12/2019: ROBOT ASSISTED LAPAROSCOPIC PARTIAL COLECTOMY     Comment:  [place]  BMI    Body Mass Index: 29.95 kg/m      Reproductive/Obstetrics negative OB ROS                             Anesthesia Physical Anesthesia Plan  ASA: 3  Anesthesia Plan: General   Post-op Pain Management:    Induction: Intravenous  PONV Risk Score and Plan: 2 and Treatment may vary due to age or medical condition, TIVA and Propofol infusion  Airway Management Planned: Natural Airway and Nasal Cannula  Additional Equipment:   Intra-op Plan:   Post-operative Plan:   Informed Consent: I have reviewed the patients History and Physical, chart, labs and discussed the procedure including the risks, benefits and alternatives for the proposed anesthesia with the patient or authorized  representative who has indicated his/her understanding and acceptance.   Patient has DNR.  Discussed DNR with patient and Suspend DNR.   Dental Advisory Given  Plan Discussed with: Anesthesiologist, CRNA and Surgeon  Anesthesia Plan Comments:         Anesthesia Quick Evaluation

## 2023-02-25 NOTE — Op Note (Signed)
[facility] Patient Name: Willie [name] Procedure Date: 02/25/2023 8:45 AM MRN: [id number] Account #: [id number] Date of Birth: [birth year] Admit Type: Outpatient Age: 82 Room: [facility] ENDO ROOM 4 Gender: Male Note Status: Finalized Instrument Name: Prentice Docker [id number] Procedure:             Colonoscopy Indications:           High risk colon cancer surveillance: Personal history                         of rectosigmoid cancer Providers:             Boykin Nearing. Norma Fredrickson MD, MD Referring MD:          Corwin Levins, MD (Referring MD) Medicines:             Propofol per Anesthesia Complications:         No immediate complications. Estimated blood loss: None. Procedure:             Pre-Anesthesia Assessment:                        - The risks and benefits of the procedure and the                         sedation options and risks were discussed with the                         patient. All questions were answered and informed                         consent was obtained.                        - Patient identification and proposed procedure were                         verified prior to the procedure by the nurse. The                         procedure was verified in the procedure room.                        - ASA Grade Assessment: III - A patient with severe                         systemic disease.                        - After reviewing the risks and benefits, the patient                         was deemed in satisfactory condition to undergo the                         procedure.                        After obtaining informed consent, the colonoscope was  passed under direct vision. Throughout the procedure,                         the patient's blood pressure, pulse, and oxygen                         saturations were monitored continuously. The                         Colonoscope was introduced through the descending                          colostomy and advanced to the the cecum, identified by                         appendiceal orifice and ileocecal valve. The                         colonoscopy was performed without difficulty. The                         patient tolerated the procedure well. The quality of                         the bowel preparation was good. The ileocecal valve,                         appendiceal orifice, and rectum were photographed. Findings:      Ostomy in LLQ open and normal without erythema or induration. No       evidence of ostomy stricture.      Many small-mouthed diverticula were found in the descending colon,       transverse colon, ascending colon and cecum.      The exam was otherwise without abnormality. Impression:            - Diverticulosis in the descending colon, in the                         transverse colon, in the ascending colon and in the                         cecum.                        - The examination was otherwise normal.                        - No specimens collected. Recommendation:        - Patient has a contact number available for                         emergencies. The signs and symptoms of potential                         delayed complications were discussed with the patient.                         Return to normal activities tomorrow. Written  discharge instructions were provided to the patient.                        - Resume previous diet.                        - Continue present medications.                        - Repeat colonoscopy in 2 years for surveillance.                        - Return to GI office PRN.                        - The findings and recommendations were discussed with                         the patient. Procedure Code(s):     --- Professional ---                        [phone], Colonoscopy through stoma; diagnostic,                         including collection of specimen(s) by brushing or                          washing, when performed (separate procedure) Diagnosis Code(s):     --- Professional ---                        K57.30, Diverticulosis of large intestine without                         perforation or abscess without bleeding                        Z85.048, Personal history of other malignant neoplasm                         of rectum, rectosigmoid junction, and anus CPT copyright 2022 American Medical Association. All rights reserved. The codes documented in this report are preliminary and upon coder review may  be revised to meet current compliance requirements. Stanton Kidney MD, MD 02/25/2023 9:03:00 AM This report has been signed electronically. Number of Addenda: 0 Note Initiated On: 02/25/2023 8:45 AM Scope Withdrawal Time: 0 hours 1 minute 46 seconds  Total Procedure Duration: 0 hours 3 minutes 12 seconds  Estimated Blood Loss:  Estimated blood loss: none.      [facility]

## 2023-02-26 ENCOUNTER — Encounter: Payer: Self-pay | Admitting: Internal Medicine

## 2023-02-27 NOTE — Anesthesia Postprocedure Evaluation (Signed)
Anesthesia Post Note  Patient: RAMIL FOIL  Procedure(s) Performed: COLONOSCOPY  Patient location during evaluation: Endoscopy Anesthesia Type: General Level of consciousness: awake and alert Pain management: pain level controlled Vital Signs Assessment: post-procedure vital signs reviewed and stable Respiratory status: spontaneous breathing, nonlabored ventilation, respiratory function stable and patient connected to nasal cannula oxygen Cardiovascular status: blood pressure returned to baseline and stable Postop Assessment: no apparent nausea or vomiting Anesthetic complications: no   No notable events documented.   Last Vitals:  Vitals:   02/25/23 0902 02/25/23 0922  BP: (!) 114/58 (!) 140/61  Pulse: 66   Resp: 15   Temp: (!) 36.4 C   SpO2: 100%     Last Pain:  Vitals:   02/25/23 0922  TempSrc:   PainSc: 0-No pain                 Lenard Simmer

## 2023-03-31 ENCOUNTER — Ambulatory Visit
Admission: RE | Admit: 2023-03-31 | Discharge: 2023-03-31 | Disposition: A | Discharge status: Home / Self Care | Payer: Medicare PPO | Source: Ambulatory Visit | Attending: Oncology | Admitting: Oncology

## 2023-03-31 DIAGNOSIS — Z85048 Personal history of other malignant neoplasm of rectum, rectosigmoid junction, and anus: Secondary | ICD-10-CM | POA: Diagnosis present

## 2023-03-31 DIAGNOSIS — Z08 Encounter for follow-up examination after completed treatment for malignant neoplasm: Secondary | ICD-10-CM | POA: Insufficient documentation

## 2023-03-31 LAB — POCT I-STAT CREATININE: Creatinine, Ser: 1.2 mg/dL (ref 0.61–1.24)

## 2023-03-31 MED ORDER — IOHEXOL 300 MG/ML  SOLN
100.0000 mL | Freq: Once | INTRAMUSCULAR | Status: AC | PRN
  Administered 2023-03-31: 100 mL via INTRAVENOUS

## 2023-04-16 ENCOUNTER — Ambulatory Visit: Payer: Medicare PPO | Admitting: Oncology

## 2023-04-16 ENCOUNTER — Other Ambulatory Visit: Payer: Medicare PPO

## 2023-04-29 ENCOUNTER — Inpatient Hospital Stay: Payer: Medicare PPO | Attending: Radiation Oncology

## 2023-04-29 ENCOUNTER — Inpatient Hospital Stay: Payer: Medicare PPO | Admitting: Oncology

## 2023-04-29 ENCOUNTER — Encounter: Payer: Self-pay | Admitting: Oncology

## 2023-04-29 VITALS — BP 129/51 | HR 58 | Temp 96.0°F | Resp 19 | Wt 172.0 lb

## 2023-04-29 DIAGNOSIS — Z8041 Family history of malignant neoplasm of ovary: Secondary | ICD-10-CM | POA: Insufficient documentation

## 2023-04-29 DIAGNOSIS — Z9049 Acquired absence of other specified parts of digestive tract: Secondary | ICD-10-CM | POA: Diagnosis not present

## 2023-04-29 DIAGNOSIS — C19 Malignant neoplasm of rectosigmoid junction: Secondary | ICD-10-CM | POA: Insufficient documentation

## 2023-04-29 DIAGNOSIS — Z85048 Personal history of other malignant neoplasm of rectum, rectosigmoid junction, and anus: Secondary | ICD-10-CM | POA: Diagnosis not present

## 2023-04-29 DIAGNOSIS — K5730 Diverticulosis of large intestine without perforation or abscess without bleeding: Secondary | ICD-10-CM | POA: Diagnosis not present

## 2023-04-29 DIAGNOSIS — Z08 Encounter for follow-up examination after completed treatment for malignant neoplasm: Secondary | ICD-10-CM

## 2023-04-29 DIAGNOSIS — Z9221 Personal history of antineoplastic chemotherapy: Secondary | ICD-10-CM | POA: Insufficient documentation

## 2023-04-29 DIAGNOSIS — Z923 Personal history of irradiation: Secondary | ICD-10-CM | POA: Diagnosis not present

## 2023-04-29 DIAGNOSIS — C20 Malignant neoplasm of rectum: Secondary | ICD-10-CM

## 2023-04-29 LAB — CBC WITH DIFFERENTIAL (CANCER CENTER ONLY)
Abs Immature Granulocytes: 0.01 10*3/uL (ref 0.00–0.07)
Basophils Absolute: 0 10*3/uL (ref 0.0–0.1)
Basophils Relative: 1 %
Eosinophils Absolute: 0.2 10*3/uL (ref 0.0–0.5)
Eosinophils Relative: 5 %
HCT: 38 % — ABNORMAL LOW (ref 39.0–52.0)
Hemoglobin: 12.8 g/dL — ABNORMAL LOW (ref 13.0–17.0)
Immature Granulocytes: 0 %
Lymphocytes Relative: 28 %
Lymphs Abs: 1.1 10*3/uL (ref 0.7–4.0)
MCH: 29.7 pg (ref 26.0–34.0)
MCHC: 33.7 g/dL (ref 30.0–36.0)
MCV: 88.2 fL (ref 80.0–100.0)
Monocytes Absolute: 0.4 10*3/uL (ref 0.1–1.0)
Monocytes Relative: 11 %
Neutro Abs: 2 10*3/uL (ref 1.7–7.7)
Neutrophils Relative %: 55 %
Platelet Count: 223 10*3/uL (ref 150–400)
RBC: 4.31 MIL/uL (ref 4.22–5.81)
RDW: 13.5 % (ref 11.5–15.5)
WBC Count: 3.8 10*3/uL — ABNORMAL LOW (ref 4.0–10.5)
nRBC: 0 % (ref 0.0–0.2)

## 2023-04-29 LAB — CMP (CANCER CENTER ONLY)
ALT: 33 U/L (ref 0–44)
AST: 28 U/L (ref 15–41)
Albumin: 3.5 g/dL (ref 3.5–5.0)
Alkaline Phosphatase: 82 U/L (ref 38–126)
Anion gap: 7 (ref 5–15)
BUN: 16 mg/dL (ref 8–23)
CO2: 25 mmol/L (ref 22–32)
Calcium: 8.9 mg/dL (ref 8.9–10.3)
Chloride: 101 mmol/L (ref 98–111)
Creatinine: 1.28 mg/dL — ABNORMAL HIGH (ref 0.61–1.24)
GFR, Estimated: 55 mL/min — ABNORMAL LOW (ref >60)
Glucose, Bld: 158 mg/dL — ABNORMAL HIGH (ref 70–99)
Potassium: 4.4 mmol/L (ref 3.5–5.1)
Sodium: 133 mmol/L — ABNORMAL LOW (ref 135–145)
Total Bilirubin: 0.5 mg/dL (ref 0.0–1.2)
Total Protein: 7.6 g/dL (ref 6.5–8.1)

## 2023-04-30 ENCOUNTER — Encounter: Payer: Self-pay | Admitting: Oncology

## 2023-04-30 LAB — CEA: CEA: 0.8 ng/mL (ref 0.0–4.7)

## 2023-04-30 NOTE — Progress Notes (Signed)
 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  He was recently found to have iron deficiency anemia which led to a colonoscopy On 07/15/2018.  Colonoscopy showed a villous partially obstructing medium-sized mass in the mid sigmoid colon.  The mass was partially circumferential measuring 5 cm.  There was another infiltrative partially obstructing medium-sized mass found in the rectosigmoid colon which also measured 5 cm.  Pathology from the sigmoid colon mass showed high-grade dysplasia at least involving an adenomatous lesion with villous features.  A more serious process is not excluded.  Rectosigmoid mass biopsy showed invasive colorectal adenocarcinoma.   Patient was referred to Dr. Maia Plan for surgical management.  Patient had a CT abdomen and pelvis with contrast which showed irregular asymmetric mural thickening of the proximal rectum measuring over 6 to 7 cm.  Potential local nodal metastases with several subcentimeter iliac lymph nodes and presacral lymph nodes.  No evidence of metastatic disease outside the pelvis.   Patient also had MRI of the pelvis with and without contrast which showed tumor length  8.5 cm with extension through muscularis propria decreased C.  There is diffuse involvement of muscularis propria.  No extramural vascular invasion or tumor thrombus.  Shortest distance of any tumor/note from mesorectal fascia 1 to 2 mm.  No involvement of adjacent organs or pelvic sidewall.  Mesorectal lymph nodes within perirectal fat including 6 mm node and a low sigmoid mesocolon node measuring 6 mm.  Another 6 mm node within the low sigmoid mesocolon representing extremity rectal lymphadenopathy.  T3N2 by MRI.  Distance from tumor to internal anal sphincter is 6.2 cm.   Patient completed 8 cycles of neoadjuvant FOLFOX chemotherapy on 12/07/2018.  Interim scans showed similar-appearing irregular wall thickening of the rectum but no retroperitoneal pelvic adenopathy.  Patient completed concurrent chemoradiation end of November 2020.   Patient had partial colectomy with a colostomy at [facility] in March 2021.  Final pathology showed invasive adenocarcinoma 6 cm grade 2.  Tumor deposit present.  Tumor invades through muscularis propria into pericolorectal tissue.  Lymphovascular invasion present.  Perineural invasion absent.  Treatment effect absent.  Extensive residual cancer with no evident tumor regression.  Poor to no response.  Margins negative.  1 out of 39 lymph nodes positive.  yPT3 pN1a.  He is currently in remission  Interval history-patient has been doing well and has not had any issues with colostomy .  He had a colonoscopy in December 2024 by Dr. Norma Fredrickson which showed diverticulosis but was otherwise normal.  Denies any changes in his appetite and weight.  ECOG PS- 1 Pain scale- 0   Review of systems- Review of Systems  Constitutional:  Negative for chills, fever, malaise/fatigue and weight loss.  HENT:  Negative for congestion, ear discharge and nosebleeds.   Eyes:  Negative for blurred vision.  Respiratory:  Negative for cough, hemoptysis, sputum production, shortness of breath and wheezing.    Cardiovascular:  Negative for chest pain, palpitations, orthopnea and claudication.  Gastrointestinal:  Negative for abdominal pain, blood in stool, constipation, diarrhea, heartburn, melena, nausea and vomiting.  Genitourinary:  Negative for dysuria, flank pain, frequency, hematuria and urgency.  Musculoskeletal:  Negative for back pain, joint pain and myalgias.  Skin:  Negative for rash.  Neurological:  Negative for dizziness, tingling, focal weakness, seizures, weakness and headaches.  Endo/Heme/Allergies:  Does not bruise/bleed easily.  Psychiatric/Behavioral:  Negative for depression and suicidal ideas. The patient does not have insomnia.       No Known Allergies   Past Medical History:  Diagnosis Date   Anemia    Bowel obstruction (HCC)    Coronary artery disease    Diabetes mellitus without complication (HCC)    GERD (gastroesophageal reflux disease)    History of hiatal hernia    Hypercholesterolemia    Hypertension    Prostate cancer (HCC) 2000   Prostatectomy.    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HCC) 09/2018   Chemo tx's.    Wears dentures    partial lower     Past Surgical History:  Procedure Laterality Date   CATARACT EXTRACTION W/PHACO Right 10/12/2019   Procedure: CATARACT EXTRACTION PHACO AND INTRAOCULAR LENS PLACEMENT (IOC) RIGHT DIABETIC 6.35 00:37.1;  Surgeon: Galen Manila, MD;  Location: [facility];  Service: Ophthalmology;  Laterality: Right;   COLONOSCOPY N/A 05/02/2021   Procedure: COLONOSCOPY;  Surgeon: [name], Boykin Nearing, MD;  Location: [facility];  Service: Gastroenterology;  Laterality: N/A;  IDDM   COLONOSCOPY N/A 02/25/2023   Procedure: COLONOSCOPY;  Surgeon: [name], Boykin Nearing, MD;  Location: [facility];  Service: Gastroenterology;  Laterality: N/A;   COLONOSCOPY WITH PROPOFOL N/A 07/15/2018   Procedure: COLONOSCOPY WITH PROPOFOL;  Surgeon: [name], Boykin Nearing, MD;  Location: [facility];  Service: Gastroenterology;  Laterality: N/A;    COLOSTOMY Left    ESOPHAGOGASTRODUODENOSCOPY N/A 10/12/2018   Procedure: ESOPHAGOGASTRODUODENOSCOPY (EGD);  Surgeon: Toney Reil, MD;  Location: [facility];  Service: Gastroenterology;  Laterality: N/A;   ESOPHAGOGASTRODUODENOSCOPY (EGD) WITH PROPOFOL N/A 07/15/2018   Procedure: ESOPHAGOGASTRODUODENOSCOPY (EGD) WITH PROPOFOL;  Surgeon: [name], Boykin Nearing, MD;  Location: [facility];  Service: Gastroenterology;  Laterality: N/A;   PORTACATH PLACEMENT N/A 08/13/2018   Procedure: INSERTION PORT-A-CATH;  Surgeon: Carolan Shiver, MD;  Location: [facility];  Service: General;  Laterality: N/A;   PROSTATE SURGERY     ROBOT ASSISTED LAPAROSCOPIC PARTIAL COLECTOMY  05/12/2019   [place]   XI ROBOT ASSISTED DIAGNOSTIC LAPAROSCOPY N/A 11/23/2021   Procedure: XI ROBOT ASSISTED DIAGNOSTIC LAPAROSCOPY;  Surgeon: Carolan Shiver, MD;  Location: [facility];  Service: General;  Laterality: N/A;    Social History   Socioeconomic History   Marital status: Widowed    Spouse name: Not on file   Number of children: 0   Years of education: Not on file   Highest education level: Not on file  Occupational History   Occupation: Runner, broadcasting/film/video    Comment: Retired  Tobacco Use   Smoking status: Never   Smokeless tobacco: Never  Vaping Use   Vaping status: Never Used  Substance and Sexual Activity   Alcohol use: Not Currently   Drug use: Never   Sexual activity: Not Currently  Other Topics Concern   Not on file  Social History Narrative   Patient is retired Chartered certified accountant and  chemistry.  He was widowed approximately 1 year ago (2019).  His wife was a Editor, commissioning and they traveled frequently.  Several nieces and nephews.  He is the youngest of his brothers and sisters and only surviving.   Social Drivers of Corporate investment banker Strain: Low Risk  (02/05/2023)   Received from [facility]   Overall Financial Resource Strain  (CARDIA)    Difficulty of Paying Living Expenses: Not hard at all  Food Insecurity: No Food Insecurity (02/05/2023)   Received from [facility]   Hunger Vital Sign    Worried About Running Out of Food in the Last Year: Never true    Ran Out of Food in the Last Year: Never true  Transportation Needs: No Transportation Needs (02/05/2023)   Received from [facility] - Transportation    In the past 12 months, has lack of transportation kept you from medical appointments or from getting medications?: No    Lack of Transportation (Non-Medical): No  Physical Activity: Insufficiently Active (11/01/2017)   Exercise Vital Sign    Days of Exercise per Week: 2 days    Minutes of Exercise per Session: 30 min  Stress: No Stress Concern Present (11/01/2017)   [employer] of Occupational Health - Occupational Stress Questionnaire    Feeling of Stress : Only a little  Social Connections: Unknown (08/31/2018)   Social Connection and Isolation Panel [NHANES]    Frequency of Communication with Friends and Family: More than three times a week    Frequency of Social Gatherings with Friends and Family: More than three times a week    Attends Religious Services: Not on file    Active Member of Clubs or Organizations: Not on file    Attends Banker Meetings: Not on file    Marital Status: Not on file  Intimate Partner Violence: Not At Risk (05/31/2022)   Humiliation, Afraid, Rape, and Kick questionnaire    Fear of Current or Ex-Partner: No    Emotionally Abused: No    Physically Abused: No    Sexually Abused: No    Family History  Problem Relation Age of Onset   Diabetes Mother    Ovarian cancer Sister      Current Outpatient Medications:    aspirin EC 81 MG tablet, Take 81 mg by mouth 3 (three) times a week. Tues weds, thurs, Disp: , Rfl:    brimonidine (ALPHAGAN) 0.2 % ophthalmic solution, Place 1 drop into both eyes 2 (two) times  daily., Disp: , Rfl:    lisinopril (ZESTRIL) 10 MG tablet, Take 10 mg by mouth daily., Disp: , Rfl:    metoprolol succinate (TOPROL-XL) 50 MG 24 hr tablet, Take 50 mg by mouth daily., Disp: , Rfl:    Netarsudil-Latanoprost 0.02-0.005 % SOLN, Place 1 drop into both eyes at bedtime., Disp: , Rfl:    NOVOLOG MIX 70/30 FLEXPEN (70-30) 100 UNIT/ML FlexPen, Inject 12 Units into the skin 2 (two) times daily with a meal., Disp: , Rfl:    pantoprazole (PROTONIX) 40 MG tablet, Take 1 tablet (40 mg total) by mouth 2 (two) times daily before a meal. (Patient taking differently: Take 40 mg by mouth daily.), Disp: 60 tablet, Rfl: 0   rosuvastatin (CRESTOR) 10 MG tablet, Take 10 mg by mouth daily., Disp: , Rfl:    tamsulosin (FLOMAX) 0.4 MG CAPS capsule, Take 0.4 mg by mouth daily., Disp: , Rfl:  traZODone (DESYREL) 50 MG tablet, Take 50 mg by mouth at bedtime as needed for sleep. , Disp: , Rfl:  No current facility-administered medications for this visit.  Facility-Administered Medications Ordered in Other Visits:    heparin lock flush 100 unit/mL, 500 Units, Intravenous, Once, Creig Hines, MD   sodium chloride flush (NS) 0.9 % injection 10 mL, 10 mL, Intravenous, Once, Creig Hines, MD   sodium chloride flush (NS) 0.9 % injection 10 mL, 10 mL, Intravenous, Once, Creig Hines, MD   sodium chloride flush (NS) 0.9 % injection 10 mL, 10 mL, Intravenous, PRN, Creig Hines, MD  Physical exam:  Vitals:   04/29/23 1137  BP: (!) 129/51  Pulse: (!) 58  Resp: 19  Temp: (!) 96 F (35.6 C)  TempSrc: Tympanic  SpO2: 100%  Weight: 172 lb (78 kg)   Physical Exam Cardiovascular:     Rate and Rhythm: Normal rate and regular rhythm.     Heart sounds: Normal heart sounds.  Pulmonary:     Effort: Pulmonary effort is normal.     Breath sounds: Normal breath sounds.  Abdominal:     General: Bowel sounds are normal.     Palpations: Abdomen is soft.     Comments: Colostomy in place  Skin:    General:  Skin is warm and dry.  Neurological:     Mental Status: He is alert and oriented to person, place, and time.         Latest Ref Rng & Units 04/29/2023   11:16 AM  CMP  Glucose 70 - 99 mg/dL 161   BUN 8 - 23 mg/dL 16   Creatinine 0.96 - 1.24 mg/dL 0.45   Sodium 409 - 811 mmol/L 133   Potassium 3.5 - 5.1 mmol/L 4.4   Chloride 98 - 111 mmol/L 101   CO2 22 - 32 mmol/L 25   Calcium 8.9 - 10.3 mg/dL 8.9   Total Protein 6.5 - 8.1 g/dL 7.6   Total Bilirubin 0.0 - 1.2 mg/dL 0.5   Alkaline Phos 38 - 126 U/L 82   AST 15 - 41 U/L 28   ALT 0 - 44 U/L 33       Latest Ref Rng & Units 04/29/2023   11:16 AM  CBC  WBC 4.0 - 10.5 K/uL 3.8   Hemoglobin 13.0 - 17.0 g/dL 91.4   Hematocrit 78.2 - 52.0 % 38.0   Platelets 150 - 400 K/uL 223     No images are attached to the encounter.  CT ABDOMEN PELVIS W CONTRAST Result Date: 04/07/2023 CLINICAL DATA:  Rectal cancer.  Restaging.  * Tracking Code: BO * EXAM: CT ABDOMEN AND PELVIS WITH CONTRAST TECHNIQUE: Multidetector CT imaging of the abdomen and pelvis was performed using the standard protocol following bolus administration of intravenous contrast. RADIATION DOSE REDUCTION: This exam was performed according to the departmental dose-optimization program which includes automated exposure control, adjustment of the mA and/or kV according to patient size and/or use of iterative reconstruction technique. CONTRAST:  OMNIPAQUE IOHEXOL 300 MG/ML  SOLN COMPARISON:  05/30/2022 FINDINGS: Lower chest: No acute abnormality. Hepatobiliary: No focal liver abnormality is seen. No gallstones, gallbladder wall thickening, or biliary dilatation. Pancreas: Unremarkable. No pancreatic ductal dilatation or surrounding inflammatory changes. Spleen: Normal in size without focal abnormality. Adrenals/Urinary Tract: Normal adrenal glands. No nephrolithiasis or hydronephrosis. Two subjacent Bosniak class 2 cysts identified within the inferior pole of the right kidney  measuring up to 1.6 cm,  image 32/2. No follow-up imaging recommended. Mild irregular bladder wall thickening identified which is favored to represent post treatment change. No focal enhancing bladder lesion identified. Stomach/Bowel: Stomach appears normal. Matted loops of nondilated small bowel noted within the inferior posterior pelvis with adjacent presacral soft tissue thickening, which is favored to represent post treatment change. The appearance is stable compared with the previous exam. Left paramidline ventral abdominal wall transverse colostomy identified. The appendix is visualized and appears normal. Colonic diverticulosis without signs of acute diverticulitis., similar to previous exam Vascular/Lymphatic: Aortic atherosclerosis. No aneurysm. No abdominal or pelvic adenopathy. Reproductive: Status post prostatectomy. Other: Similar appearance of post treatment changes in the presacral soft tissue region. No significant ascites or focal fluid collections. Musculoskeletal: No aggressive lytic or sclerotic bone lesions. Thoracolumbar degenerative disc disease. Superior endplate L4 Schmorl's node deformity as before. IMPRESSION: 1. Stable CT of the abdomen and pelvis. No signs of recurrent or metastatic disease. 2. Stable appearance of post treatment changes in the presacral soft tissue region. 3. Colonic diverticulosis without signs of acute diverticulitis. 4.  Aortic Atherosclerosis (ICD10-I70.0). Electronically Signed   By: Signa Kell M.D.   On: 04/07/2023 10:40     Assessment and plan- Patient is a 83 y.o. male here for routine surveillance of rectal cancer  Patient underwent neoadjuvant treatment for his rectal cancer including FOLFOX chemotherapy and concurrent chemoradiation back in November 2020.  He had colectomy with a colostomy in March 2021.  He remains in remission since then. CT abdomen and pelvis with contrast in January 2025 did not show any evidence of recurrent or progressive  disease.  I am planning to get scans done once a year.  I will see him back in 6 months with CBC with differential CMP and CEA   Visit Diagnosis 1. Encounter for follow-up surveillance of rectal cancer   2. Rectal cancer [facility])      Dr. Owens Shark, MD, MPH [facility] [phone] 04/30/2023 10:08 AM

## 2023-06-06 ENCOUNTER — Other Ambulatory Visit: Payer: Medicare PPO

## 2023-06-06 ENCOUNTER — Ambulatory Visit: Payer: Medicare PPO | Admitting: Oncology

## 2023-11-07 ENCOUNTER — Telehealth: Payer: Self-pay | Admitting: Oncology

## 2023-11-07 NOTE — Discharge Summary (Addendum)
 ------------------------------------------------------------------------------- Attestation signed by Exie Damien Murray, MD at 11/07/23 1242 I saw and evaluated the patient on 11/07/2023, participating in the key portions of the service. I reviewed the resident's note. I agree with the resident's findings and plan. Damien MARLA Exie, MD   Patient is feeling much better and UTI symptoms have resolved. Will treat as uncomplicated cystitis. Transitioned to PO Augmentin based on culture data. Greater than 30 minutes spent in discharge of this patient.  -------------------------------------------------------------------------------   Physician Discharge Summary [facility] [facility] [address] [place] [address] Dept: [phone] Loc: [phone]   Identifying Information:  Willie [name] [birth year] [id number]  Primary Care Physician: Rudolpho Norleen BIRCH, MD  Code Status: Full Code  Admit Date: 11/05/2023  Discharge Date: 11/07/2023   Discharge To: Home  Discharge Service: [facility] - General Medicine Floor Team (MED M - [place])   Discharge Attending Physician: Damien Murray Exie, MD  Discharge Diagnoses: Principal Problem:   Dysuria (POA: Yes) Active Problems:   Abdominal pain (POA: Yes) Resolved Problems:   * No resolved hospital problems. *   Outpatient Provider Follow Up Issues:  [ ]  Outpatient PCP f/up as scheduled  Hospital Course:  Drue Harr is an 83 year old male with a history of NSTEMI, coronary artery disease, hypertension, hyperlipidemia, type 2 diabetes mellitus, recurrent small bowel obstruction, colorectal cancer (status post colectomy with coloproctostomy), prostate cancer, pancreatitis, benign prostatic hyperplasia, and GERD, who was admitted on 11/05/2023 for evaluation of dysuria and abdominal pain.  PROBLEM-ORIENTED HOSPITAL SUMMARY  Urinary Tract Infection He presented with dysuria, increased urinary frequency and urgency, suprapubic pain, and burning  with urination, accompanied by fever (100.48F) and moderate suprapubic tenderness on exam. Urinalysis showed moderate leukocyte esterase, moderate hematuria, and 30 WBCs, and urine culture grew Citrobacter koseri He was treated with cefepime , which was subsequently discontinued during the hospitalization. His symptoms improved with antibiotics, and he remained afebrile and hemodynamically stable at discharge. Post-void residual was measured at ~260 mL, and he had a history of urinary retention recently managed with tamsulosin . Blood cultures showed no growth to date. He was treated with cefepime  while awaiting sensitivities, then transitioned to Augmentin to complete remainder of course.   Abdominal Pain, Nausea, and Emesis He reported intermittent burning and sourness pain in the central chest/upper abdomen, nausea, an episode of emesis, and intense back pain beginning the morning of admission. CT abdomen/pelvis showed chronic small bowel dilation and new bladder wall thickening consistent with cystitis, but no acute intra-abdominal pathology. Ostomy output remained appropriate throughout the hospitalization, and there was low concern for small bowel obstruction. His abdominal pain and nausea resolved prior to discharge.  Recent Small Bowel Obstruction (Resolved) He was recently discharged on 11/03/2023 following hospitalization for small bowel obstruction, with gradual diet advancement and stable ostomy output. During this admission, ostomy output remained good and there were no signs of recurrent obstruction.  Nutrition and Weight Loss He experienced a 7 lb (4.2%) weight loss over 8 days in the context of decreased appetite, nausea, and diarrhea. Nutrition assessment indicated mild fat and muscle loss, and moderate protein-calorie malnutrition in the setting of acute illness. He was able to tolerate oral intake and was recommended to start oral supplements and continue a regular diet.  Procedures: No  admission procedures for hospital encounter. ______________________________________________________________________ Discharge Medications:   Your Medication List     START taking these medications    amoxicillin-clavulanate 500-125 mg per tablet Commonly known as: AUGMENTIN Take 1 tablet by mouth two (2) times  a day for 3 days.   simethicone  80 MG chewable tablet Commonly known as: MYLICON Chew 1 tablet (80 mg total) every six (6) hours as needed.       CHANGE how you take these medications    metoPROLOL  succinate 25 MG 24 hr tablet Commonly known as: Toprol -XL Take 1 tablet (25 mg total) by mouth daily. What changed:  medication strength how much to take when to take this       CONTINUE taking these medications    AZO ORAL Take 1 tablet by mouth Three (3) times a day. For 14 days   brimonidine  0.2 % ophthalmic solution Commonly known as: ALPHAGAN  Administer 1 drop to both eyes two (2) times a day.   lisinopril  10 MG tablet Commonly known as: PRINIVIL ,ZESTRIL  Take 1 tablet (10 mg total) by mouth daily.   ondansetron  4 MG disintegrating tablet Commonly known as: ZOFRAN -ODT Dissolve 1 tablet (4 mg total) in the mouth every six (6) hours as needed.   pantoprazole  40 MG tablet Commonly known as: Protonix  Take 1 tablet (40 mg total) by mouth in the morning.   ROCKLATAN  0.02-0.005 % Drop Generic drug: netarsudil -latanoprost  Administer 1 drop to both eyes nightly.   rosuvastatin  10 MG tablet Commonly known as: CRESTOR  Take 1 tablet (10 mg total) by mouth daily.   tamsulosin  0.4 mg capsule Commonly known as: FLOMAX  Take 2 capsules (0.8 mg total) by mouth nightly.   traZODone  50 MG tablet Commonly known as: DESYREL  Take 1 tablet (50 mg total) by mouth nightly as needed for sleep.        Allergies: Patient has no known allergies. ______________________________________________________________________ Pending Test Results (if blank, then  none): Pending Labs     Order Current Status   Blood Culture Preliminary result   Blood Culture Preliminary result       Most Recent Labs: All lab results last 24 hours -  Recent Results (from the past 24 hours)  POCT Glucose   Collection Time: 11/06/23  5:26 PM  Result Value Ref Range   Glucose, POC 174 70 - 179 mg/dL  POCT Glucose   Collection Time: 11/06/23  8:58 PM  Result Value Ref Range   Glucose, POC 191 (H) 70 - 179 mg/dL  Basic Metabolic Panel   Collection Time: 11/07/23  5:39 AM  Result Value Ref Range   Sodium 140 135 - 145 mmol/L   Potassium 3.9 3.4 - 4.8 mmol/L   Chloride 103 98 - 107 mmol/L   CO2 23.3 20.0 - 31.0 mmol/L   Anion Gap 14 5 - 14 mmol/L   BUN 8 (L) 9 - 23 mg/dL   Creatinine 8.98 9.26 - 1.18 mg/dL   BUN/Creatinine Ratio 8    eGFR CKD-EPI (2021) Male 73 >=60 mL/min/1.36m2   Glucose 185 (H) 70 - 179 mg/dL   Calcium  8.5 (L) 8.7 - 10.4 mg/dL  Phosphorus Level   Collection Time: 11/07/23  5:39 AM  Result Value Ref Range   Phosphorus 2.3 (L) 2.4 - 5.1 mg/dL  Magnesium  Level   Collection Time: 11/07/23  5:39 AM  Result Value Ref Range   Magnesium  1.8 1.6 - 2.6 mg/dL  CBC   Collection Time: 11/07/23  5:39 AM  Result Value Ref Range   WBC 4.3 3.6 - 11.2 10*9/L   RBC 3.65 (L) 4.26 - 5.60 10*12/L   HGB 11.0 (L) 12.9 - 16.5 g/dL   HCT 67.7 (L) 60.9 - 51.9 %   MCV 88.2 77.6 -  95.7 fL   MCH 30.1 25.9 - 32.4 pg   MCHC 34.1 32.0 - 36.0 g/dL   RDW 85.7 87.7 - 84.7 %   MPV 7.6 6.8 - 10.7 fL   Platelet 259 150 - 450 10*9/L  POCT Glucose   Collection Time: 11/07/23  9:20 AM  Result Value Ref Range   Glucose, POC 154 70 - 179 mg/dL    Relevant Studies/Radiology (if blank, then none): ECG 12 Lead Result Date: 11/06/2023 SINUS RHYTHM WITH FREQUENT PREMATURE VENTRICULAR BEATS PROLONGED QT ABNORMAL ECG WHEN COMPARED WITH ECG OF 05-Nov-2023 10:28, NO SIGNIFICANT CHANGE WAS FOUND Confirmed by Antonetta Gull ([id number]) on 11/06/2023 10:58:42 AM  ECG 12  Lead Result Date: 11/06/2023 SINUS RHYTHM WITH FREQUENT PREMATURE VENTRICULAR BEATS OTHERWISE NORMAL ECG WHEN COMPARED WITH ECG OF 30-Oct-2023 09:45, NO SIGNIFICANT CHANGE WAS FOUND Confirmed by Antonetta Gull ([id number]) on 11/06/2023 10:54:58 AM  ECG 12 Lead Result Date: 11/06/2023 SINUS RHYTHM WITH FREQUENT PREMATURE VENTRICULAR BEATS PROLONGED QT ABNORMAL ECG WHEN COMPARED WITH ECG OF 30-Oct-2023 09:45, NO SIGNIFICANT CHANGE WAS FOUND Confirmed by Antonetta Gull ([id number]) on 11/06/2023 10:54:25 AM  CT Abdomen Pelvis W IV Contrast Only Result Date: 11/05/2023 EXAM: CT ABDOMEN PELVIS W CONTRAST ACCESSION: [id number] UN REPORT DATE: 11/05/2023 10:23 AM CLINICAL INDICATION: 83 years old with recent hospitalization with partial SBO here with abdominal distension and pain  COMPARISON: CT ABDOMEN PELVIS W CONTRAST 10/29/2023, 07/10/2023, 05/13/2023. Gastrografin  study on 11/01/2023. TECHNIQUE: A helical CT scan of the abdomen and pelvis was obtained following IV contrast from the lung bases through the pubic symphysis. Images were reconstructed in the axial plane. Coronal and sagittal reformatted images were also provided for further evaluation. FINDINGS: LOWER CHEST: Please see dedicated CT chest for characterization of findings above the diaphragm. LIVER: Normal liver contour.  No focal liver lesions. BILIARY: The gallbladder is normal in appearance. No biliary ductal dilatation.  SPLEEN: Normal in size and contour. 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 PANCREAS: Diffuse pancreatic atrophy. No focal lesions.  No ductal dilation. ADRENAL GLANDS: Mild left adrenal gland thickening, unchanged. No right adrenal thickening. KIDNEYS/URETERS: Symmetric renal enhancement.  No hydronephrosis. No solid renal mass. Similar of bilateral perinephric stranding, left greater than right. Unchanged 2.1 cm right renal cyst. BLADDER: Urinary bladder is underdistended.  Circumferential thickening of the bladder wall, with adjacent fat stranding. Dilated left ureter (3:57) without evidence of obstructing calculus. REPRODUCTIVE ORGANS: Prostate is surgically absent. GI TRACT: Status post partial colectomy with left anterior abdominal wall colostomy. Likely rectal stump seen within the lower pelvis, at site of anastomotic changes (4:125, 7:55). Small bowel dilatation with transition point in the left pelvis (4:113), abutting the rectal stump suture. Small bowel dilation is similar to recent prior on 10/29/2023 and has been seen on multiple priors dating back to at least 05/13/2023. Of note, left lower quadrant small bowel dilation was described on outside report from 11/27/2021. Bowel loops at the site of transition point are thickened and enhancing, increasing over priors. No pneumatosis. Normal appendix. Small hiatal hernia, with associated wall thickening and inflammation, similar to prior. Diffuse colonic diverticulosis. PERITONEUM, RETROPERITONEUM AND MESENTERY: No free air. Slowly increasing presacral soft tissue thickening and enhancement, compared to multiple priors. Trace amount of fluid in the presacral space, increased from priors. LYMPH NODES: No adenopathy. VESSELS: Hepatic and portal veins are patent.  Normal caliber aorta. Moderate diffuse mixed atherosclerotic plaque. BONES and SOFT TISSUES: Multilevel degenerative changes thoracolumbar spine and  pelvis. Multilevel bridging anterior osteophytosis, compatible with DISH. Severe degenerative change of the bilateral hips. No aggressive osseous lesions.  No focal soft tissue lesions. Right inguinal hernia containing fat with layering fluid, moderate, unchanged. Small left inguinal hernia containing fat.   1.  Persistent left lower quadrant small bowel dilation, with transition point near the rectal stump, seen on multiple prior exams dating back to at least 05/13/2023. Of note, there are outside studies dating back to 11/27/2021  that described these dilated loops. On recent Gastrografin  study from 11/01/2023, oral contrast is seen within the in nondilated stomach, duodenum, proximal small bowel loops, and the remnant right/transverse colon. Throughout the Gastrografin  study, oral contrast never contrast opacifies these dilated loops of left lower quadrant small bowel. Findings raise possibility of occult underlying enterocolonic fistula, proximal to the dilated small bowel loops, or complex prior gastrointestinal surgical history, with diversion proximal to these dilated left upper quadrant small bowel loops. Chronic partial small obstruction is the other possibility. Consider correlation with prior surgical history and outside imaging. CT with oral contrast could be obtained to further assess the anatomy. 2.  Dilated bowel loops at the site of transition point, near the rectal stump, have wall thickening and enhancement, increasing over multiple priors. Increased presacral soft tissue stranding and trace free fluid, near the transition point. Findings in the dilated small bowel may represent nonspecific enteritis (from infectious, inflammatory, or ischemic causes). Infiltration of the presacral soft tissue may cause this appearance. Presacral soft tissue is nonspecific and may represent postsurgical scar or infectious/inflammatory. Malignancy is not excluded. 3.  Moderate fat and fluid-containing right inguinal hernia, unchanged. 4.  Decompressed urinary bladder, with apparent wall thickening, which may represent cystitis. Correlate urinalysis. 5.  Small hiatal hernia, with suggestion of wall thickening and inflammation.   CTA Chest W Contrast Result Date: 11/05/2023 EXAM: CTA CHEST W CONTRAST ACCESSION: [id number] UN REPORT DATE: 11/05/2023 10:24 AM CLINICAL INDICATION: chest pain with recent hospitalization eval for PE TECHNIQUE: Contiguous axial images were reconstructed through the chest following a single breath hold helical  acquisition during the administration of intravenous contrast material. Images were reformatted in the coronal and sagittal planes. MIP slabs were also constructed. COMPARISON: XR CHEST PORTABLE 11/05/2023 FINDINGS: PULMONARY ARTERIES: No embolus in either lung. Main pulmonary artery is normal in size. HEART/VASCULATURE: Cardiac chambers are normal in size. Moderate coronary artery calcifications. No pericardial effusion. Normal caliber aorta with mild calcifications. LUNGS/AIRWAYS/PLEURA: Trachea and large airways are patent. No focal consolidation or pulmonary edema. Bilateral dependent atelectasis. Few pulmonary nodules measuring up to 5 mm are present. For example 5 mm right middle lobe nodule (2:163). No pleural effusion or pneumothorax. MEDIASTINUM/THORACIC INLET: No enlarged supraclavicular or intrathoracic lymph nodes. No thyroid abnormality. Small sliding type hiatal hernia. UPPER ABDOMEN: Reported separately. CHEST WALL/BONES: No enlarged axillary lymph nodes. Degenerative changes are present. No suspicious lytic or sclerotic lesions. DEVICES: None.   No pulmonary embolism or other acute findings. Scattered nonspecific pulmonary nodules measuring up to 5 mm. Small hiatal hernia and patulous esophagus that increases risk of reflux and aspiration.   XR Chest Portable Result Date: 11/05/2023 EXAM: XR CHEST PORTABLE ACCESSION: [id number] UN REPORT DATE: 11/05/2023 8:44 AM CLINICAL INDICATION: CHEST PAIN  TECHNIQUE: Single View AP Chest Radiograph. COMPARISON: XR CHEST PORTABLE 05/13/2023 FINDINGS: Lungs are clear.  No pleural effusion or pneumothorax. Normal heart size and mediastinal contours. Chronic changes in the spine   No acute abnormalities.   ______________________________________________________________________ Discharge Instructions:   Diet Instructions  Discharge diet (specify)     Discharge Nutrition Therapy: Regular       Follow Up instructions and Outpatient Referrals    Call  MD for:  difficulty breathing, headache or visual disturbances     Call MD for:  difficulty breathing, headache or visual disturbances     Call MD for:  persistent dizziness or light-headedness     Call MD for:  persistent nausea or vomiting     Call MD for:  persistent nausea or vomiting     Call MD for:  severe uncontrolled pain     Call MD for:  severe uncontrolled pain     Call MD for:  temperature >38.5 Celsius     Call MD for:  temperature >38.5 Celsius     Discharge instructions       Other Instructions     Call MD for:  difficulty breathing, headache or visual disturbances     Call MD for:  difficulty breathing, headache or visual disturbances     Call MD for:  persistent dizziness or light-headedness     Call MD for:  persistent nausea or vomiting     Call MD for:  persistent nausea or vomiting     Call MD for:  severe uncontrolled pain     Call MD for:  severe uncontrolled pain     Call MD for:  temperature >38.5 Celsius     Call MD for:  temperature >38.5 Celsius     Discharge instructions     You were admitted to the hospital for a UTI. You will need to keep taking antibiotics for a few more days to treat the infection. We otherwise didn't change any of your home medications, so keep taking them as you were prior to coming into the hospital.        ______________________________________________________________________ Discharge Day Services: BP 153/59   Pulse 62   Temp 36.6 C (97.9 F) (Oral)   Resp 21   Ht 162.6 cm (5' 4)   Wt 73 kg (160 lb 14.4 oz)   SpO2 97%   BMI 27.62 kg/m  Pt seen on the day of discharge and determined appropriate for discharge.  Condition at Discharge: good  Length of Discharge: I spent greater than 30 mins in the discharge of this patient.

## 2023-11-07 NOTE — Telephone Encounter (Signed)
 Pt stated he was in the hospital and needed to r/s his appts from 9/2. Appts have been r/s with pt to 9/12.

## 2023-11-11 ENCOUNTER — Inpatient Hospital Stay: Payer: Medicare PPO | Admitting: Oncology

## 2023-11-11 ENCOUNTER — Other Ambulatory Visit: Payer: Medicare PPO

## 2023-11-21 ENCOUNTER — Inpatient Hospital Stay: Attending: Oncology

## 2023-11-21 ENCOUNTER — Inpatient Hospital Stay: Admitting: Oncology

## 2023-11-21 ENCOUNTER — Encounter: Payer: Self-pay | Admitting: Oncology

## 2023-11-21 DIAGNOSIS — C19 Malignant neoplasm of rectosigmoid junction: Secondary | ICD-10-CM | POA: Diagnosis present

## 2023-11-21 DIAGNOSIS — Z9049 Acquired absence of other specified parts of digestive tract: Secondary | ICD-10-CM | POA: Insufficient documentation

## 2023-11-21 DIAGNOSIS — Z85048 Personal history of other malignant neoplasm of rectum, rectosigmoid junction, and anus: Secondary | ICD-10-CM

## 2023-11-21 DIAGNOSIS — Z8041 Family history of malignant neoplasm of ovary: Secondary | ICD-10-CM | POA: Diagnosis not present

## 2023-11-21 DIAGNOSIS — K56609 Unspecified intestinal obstruction, unspecified as to partial versus complete obstruction: Secondary | ICD-10-CM

## 2023-11-21 DIAGNOSIS — Z9221 Personal history of antineoplastic chemotherapy: Secondary | ICD-10-CM | POA: Diagnosis not present

## 2023-11-21 DIAGNOSIS — C20 Malignant neoplasm of rectum: Secondary | ICD-10-CM

## 2023-11-21 DIAGNOSIS — Z923 Personal history of irradiation: Secondary | ICD-10-CM

## 2023-11-21 DIAGNOSIS — Z08 Encounter for follow-up examination after completed treatment for malignant neoplasm: Secondary | ICD-10-CM

## 2023-11-21 LAB — CBC WITH DIFFERENTIAL (CANCER CENTER ONLY)
Abs Immature Granulocytes: 0.02 K/uL (ref 0.00–0.07)
Basophils Absolute: 0.1 K/uL (ref 0.0–0.1)
Basophils Relative: 2 %
Eosinophils Absolute: 0.1 K/uL (ref 0.0–0.5)
Eosinophils Relative: 4 %
HCT: 33.8 % — ABNORMAL LOW (ref 39.0–52.0)
Hemoglobin: 11 g/dL — ABNORMAL LOW (ref 13.0–17.0)
Immature Granulocytes: 1 %
Lymphocytes Relative: 30 %
Lymphs Abs: 0.9 K/uL (ref 0.7–4.0)
MCH: 29.5 pg (ref 26.0–34.0)
MCHC: 32.5 g/dL (ref 30.0–36.0)
MCV: 90.6 fL (ref 80.0–100.0)
Monocytes Absolute: 0.3 K/uL (ref 0.1–1.0)
Monocytes Relative: 10 %
Neutro Abs: 1.8 K/uL (ref 1.7–7.7)
Neutrophils Relative %: 53 %
Platelet Count: 201 K/uL (ref 150–400)
RBC: 3.73 MIL/uL — ABNORMAL LOW (ref 4.22–5.81)
RDW: 14.2 % (ref 11.5–15.5)
WBC Count: 3.2 K/uL — ABNORMAL LOW (ref 4.0–10.5)
nRBC: 0 % (ref 0.0–0.2)

## 2023-11-21 LAB — CMP (CANCER CENTER ONLY)
ALT: 30 U/L (ref 0–44)
AST: 25 U/L (ref 15–41)
Albumin: 3.2 g/dL — ABNORMAL LOW (ref 3.5–5.0)
Alkaline Phosphatase: 71 U/L (ref 38–126)
Anion gap: 6 (ref 5–15)
BUN: 14 mg/dL (ref 8–23)
CO2: 23 mmol/L (ref 22–32)
Calcium: 8.4 mg/dL — ABNORMAL LOW (ref 8.9–10.3)
Chloride: 104 mmol/L (ref 98–111)
Creatinine: 1.1 mg/dL (ref 0.61–1.24)
GFR, Estimated: >60 mL/min (ref >60)
Glucose, Bld: 155 mg/dL — ABNORMAL HIGH (ref 70–99)
Potassium: 3.9 mmol/L (ref 3.5–5.1)
Sodium: 133 mmol/L — ABNORMAL LOW (ref 135–145)
Total Bilirubin: 0.5 mg/dL (ref 0.0–1.2)
Total Protein: 6.5 g/dL (ref 6.5–8.1)

## 2023-11-21 NOTE — Progress Notes (Signed)
 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  He was recently found to have iron deficiency anemia which led to a colonoscopy On 07/15/2018.  Colonoscopy showed a villous partially obstructing medium-sized mass in the mid sigmoid colon.  The mass was partially circumferential measuring 5 cm.  There was another infiltrative partially obstructing medium-sized mass found in the rectosigmoid colon which also measured 5 cm.  Pathology from the sigmoid colon mass showed high-grade dysplasia at least involving an adenomatous lesion with villous features.  A more serious process is not excluded.  Rectosigmoid mass biopsy showed invasive colorectal adenocarcinoma.   Patient was referred to Dr. Cesar for surgical management.  Patient had a CT abdomen and pelvis with contrast which showed irregular asymmetric mural thickening of the proximal rectum measuring over 6 to 7 cm.  Potential local nodal metastases with several subcentimeter iliac lymph nodes and presacral lymph nodes.  No evidence of metastatic disease outside the pelvis.   Patient also had MRI of the pelvis with and without contrast which showed tumor  length 8.5 cm with extension through muscularis propria decreased C.  There is diffuse involvement of muscularis propria.  No extramural vascular invasion or tumor thrombus.  Shortest distance of any tumor/note from mesorectal fascia 1 to 2 mm.  No involvement of adjacent organs or pelvic sidewall.  Mesorectal lymph nodes within perirectal fat including 6 mm node and a low sigmoid mesocolon node measuring 6 mm.  Another 6 mm node within the low sigmoid mesocolon representing extremity rectal lymphadenopathy.  T3N2 by MRI.  Distance from tumor to internal anal sphincter is 6.2 cm.   Patient completed 8 cycles of neoadjuvant FOLFOX chemotherapy on 12/07/2018.  Interim scans showed similar-appearing irregular wall thickening of the rectum but no retroperitoneal pelvic adenopathy.  Patient completed concurrent chemoradiation end of November 2020.   Patient had partial colectomy with a colostomy at [facility] in March 2021.  Final pathology showed invasive adenocarcinoma 6 cm grade 2.  Tumor deposit present.  Tumor invades through muscularis propria into pericolorectal tissue.  Lymphovascular invasion present.  Perineural invasion absent.  Treatment effect absent.  Extensive residual cancer with no evident tumor regression.  Poor to no response.  Margins negative.  1 out of 39 lymph nodes positive.  yPT3 pN1a.  He is currently in remission  He has been getting recurrent episodes of small bowel obstruction since his surgery which have been managed conservatively and have not required any surgical intervention  Interval history-patient was admitted to the hospital last month at [facility] and was treated for UTI.  There was also a concern for small bowel obstruction which was managed conservatively.  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  Ostomy presently is functioning well and he denies any abdominal pain nausea vomiting today  ECOG PS- 2 Pain scale- 0   Review of systems- Review of Systems   Constitutional:  Positive for malaise/fatigue. Negative for chills, fever and weight loss.  HENT:  Negative for congestion, ear discharge and nosebleeds.   Eyes:  Negative for blurred vision.  Respiratory:  Negative for cough, hemoptysis, sputum production, shortness of breath and wheezing.   Cardiovascular:  Negative for chest pain, palpitations, orthopnea and claudication.  Gastrointestinal:  Negative for abdominal pain, blood in stool, constipation, diarrhea, heartburn, melena, nausea and vomiting.  Genitourinary:  Negative for dysuria, flank pain, frequency, hematuria and urgency.  Musculoskeletal:  Negative for back pain, joint pain and myalgias.  Skin:  Negative for rash.  Neurological:  Positive for sensory change (Peripheral neuropathy). Negative for dizziness, tingling, focal weakness, seizures, weakness and headaches.  Endo/Heme/Allergies:  Does not bruise/bleed easily.  Psychiatric/Behavioral:  Negative for depression and suicidal ideas. The patient does not have insomnia.       No Known Allergies   Past Medical History:  Diagnosis Date   Anemia    Bowel obstruction (HCC)    Coronary artery disease    Diabetes mellitus without complication (HCC)    GERD (gastroesophageal reflux disease)    History of hiatal hernia    Hypercholesterolemia    Hypertension    Prostate cancer (HCC) 2000   Prostatectomy.    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HCC) 09/2018   Chemo tx's.    Wears dentures    partial lower     Past Surgical History:  Procedure Laterality Date   CATARACT EXTRACTION W/PHACO Right 10/12/2019   Procedure: CATARACT EXTRACTION PHACO AND INTRAOCULAR LENS PLACEMENT (IOC) RIGHT DIABETIC 6.35 00:37.1;  Surgeon: Jaye Fallow, MD;  Location[facility];  Service: Ophthalmology;  Laterality: Right;   COLONOSCOPY N/A 05/02/2021   Procedure: COLONOSCOPY;  Surgeon: [name], Ladell POUR, MD;  Location: [facility] END[facility]COPY;  Service: Gastroenterology;  Laterality: N/A;  IDDM    COLONOSCOPY N/A 02/25/2023   Procedure: COLONOSCOPY;  Surgeon: [name], Ladell POUR, MD;  Location: [facility];  Service: Gastroenterology;  Laterality: N/A;   COLONOSCOPY WITH PROPOFOL  N/A 07/15/2018   Procedure: COLONOSCOPY WITH PROPOFOL ;  Surgeon: [name], Ladell POUR, MD;  Location: [facility] ENDOSCOPY;  Service: Gastroenterology;  Laterality: N/A;   COLOSTOMY Left    ESOPHAGOGASTRODUODENOSCOPY N/A 10/12/2018   Procedure: ESOPHAGOGASTRODUODENOSCOPY (EGD);  Surgeon: Unk Corinn Skiff, MD;  Location: [facility] ENDOSCOPY;  Service: Gastroenterology;  Laterality: N/A;   ESOPHAGOGASTRODUODENOSCOPY (EGD) WITH PROPOFOL  N/A 07/15/2018   Procedure: ESOPHAGOGASTRODUODENOSCOPY (EGD) WITH PROPOFOL ;  Surgeon: [name], Ladell POUR, MD;  Location: [facility] END[facility];  Service: Gastroenterology;  Laterality: N/A;   PORTACATH PLACEMENT N/A 08/13/2018   Procedure: INSERTION PORT-A-CATH;  Surgeon: Rodolph Romano, MD;  Location: [facility] ORS;  Service: General;  Laterality: N/A;   PROSTATE SURGERY     ROBOT ASSISTED LAPAROSCOPIC PARTIAL COLECTOMY  05/12/2019   [place]   XI ROBOT ASSISTED DIAGNOSTIC LAPAROSCOPY N/A 11/23/2021   Procedure: XI ROBOT ASSISTED DIAGNOSTIC LAPAROSCOPY;  Surgeon: Rodolph Romano, MD;  Location: [facility] ORS;  Service: General;  Laterality: N/A;    Social History   Socioeconomic History   Marital status: Widowed    Spouse name: Not on file   Number of children: 0   Years of education: Not on file   Highest education level: Not on file  Occupational History   Occupation: teacher    Comment: Retired  Tobacco Use   Smoking status: Never   Smokeless tobacco: Never  Vaping Use   Vaping status: Never Used  Substance and Sexual Activity   Alcohol use: Not Currently   Drug use: Never   Sexual activity: Not Currently  Other Topics Concern   Not on file  Social History Narrative   Patient is retired Electrical engineer.  He was widowed  approximately 1 year ago (2019).  His wife was a Editor, commissioning and they traveled frequently.  Several nieces and nephews.  He is the youngest of his brothers and sisters and only surviving.   Social Drivers of Corporate investment banker Strain: Low Risk  (10/29/2023)   Received from [facility]   Overall Financial Resource Strain (CARDIA)    How hard is it for you to pay for the very basics like food, housing, medical care, and heating?: Not hard at all  Food Insecurity: No Food Insecurity (10/29/2023)   [facility]   Hunger Vital Sign    Within the past 12 months, you worried that your food would run out before you got the money to buy more.: Never true    Within the past 12 months, the food you bought just didn't last and you didn't have money to get more.: Never true  Transportation Needs: No Transportation Needs (10/29/2023)   Received from [facility] - Transportation    Lack of Transportation (Medical): No    Lack of Transportation (Non-Medical): No  Physical Activity: Insufficiently Active (11/01/2017)   Exercise Vital Sign    Days of Exercise per Week: 2 days    Minutes of Exercise per Session: 30 min  Stress: No Stress Concern Present (11/01/2017)   [employer] of Occupational Health - Occupational Stress Questionnaire    Feeling of Stress : Only a little  Social Connections: Unknown (08/31/2018)   Social Connection and Isolation Panel    Frequency of Communication with Friends and Family: More than three times a week    Frequency of Social Gatherings with Friends and Family: More than three times a week    Attends Religious Services: Not on file    Active Member of Clubs or Organizations: Not on file    Attends Banker Meetings: Not on file    Marital Status: Not on file  Intimate Partner Violence: Not At Risk (05/31/2022)   Humiliation, Afraid, Rape, and Kick questionnaire    Fear of Current or Ex-Partner: No     Emotionally Abused: No    Physically Abused: No    Sexually Abused: No    Family History  Problem Relation Age of Onset   Diabetes Mother    Ovarian cancer Sister      Current Outpatient Medications:    aspirin  EC 81 MG tablet, Take 81 mg by mouth 3 (three) times a week. Tues weds, thurs, Disp: , Rfl:    brimonidine  (ALPHAGAN ) 0.2 % ophthalmic solution, Place 1 drop into both eyes 2 (two) times daily., Disp: , Rfl:    lisinopril  (ZESTRIL ) 10 MG tablet, Take 10 mg by mouth daily., Disp: , Rfl:    metoprolol  succinate (TOPROL -XL) 50 MG 24 hr tablet, Take 50 mg by mouth daily., Disp: , Rfl:    Netarsudil -Latanoprost  0.02-0.005 % SOLN, Place 1 drop into both eyes at bedtime., Disp: , Rfl:    NOVOLOG  MIX 70/30 FLEXPEN (70-30) 100 UNIT/ML FlexPen, Inject 12 Units into the skin 2 (two) times daily with a meal., Disp: , Rfl:    pantoprazole  (PROTONIX ) 40 MG tablet,  Take 1 tablet (40 mg total) by mouth 2 (two) times daily before a meal. (Patient taking differently: Take 40 mg by mouth daily.), Disp: 60 tablet, Rfl: 0   rosuvastatin  (CRESTOR ) 10 MG tablet, Take 10 mg by mouth daily., Disp: , Rfl:    simethicone  (MYLICON) 80 MG chewable tablet, Chew 80 mg by mouth., Disp: , Rfl:    tamsulosin  (FLOMAX ) 0.4 MG CAPS capsule, Take 0.4 mg by mouth daily., Disp: , Rfl:    traZODone  (DESYREL ) 50 MG tablet, Take 50 mg by mouth at bedtime as needed for sleep. , Disp: , Rfl:  No current facility-administered medications for this visit.  Facility-Administered Medications Ordered in Other Visits:    heparin  lock flush 100 unit/mL, 500 Units, Intravenous, Once, Melanee Annah BROCKS, MD   sodium chloride  flush (NS) 0.9 % injection 10 mL, 10 mL, Intravenous, Once, Melanee Annah BROCKS, MD   sodium chloride  flush (NS) 0.9 % injection 10 mL, 10 mL, Intravenous, Once, Melanee Annah BROCKS, MD   sodium chloride  flush (NS) 0.9 % injection 10 mL, 10 mL, Intravenous, PRN, Melanee Annah BROCKS, MD  Physical exam:  Vitals:   11/21/23 1139   BP: 126/60  Pulse: 60  Resp: 18  Temp: 98.7 F (37.1 C)  TempSrc: Tympanic  SpO2: 100%  Weight: 161 lb (73 kg)  Height: 5' 4 (1.626 m)   Physical Exam Cardiovascular:     Rate and Rhythm: Normal rate and regular rhythm.     Heart sounds: Normal heart sounds.  Pulmonary:     Effort: Pulmonary effort is normal.     Breath sounds: Normal breath sounds.  Abdominal:     General: Bowel sounds are normal.     Palpations: Abdomen is soft.     Comments: Colostomy in place  Skin:    General: Skin is warm and dry.  Neurological:     Mental Status: He is alert and oriented to person, place, and time.      I have personally reviewed labs listed below:    Latest Ref Rng & Units 11/21/2023   11:07 AM  CMP  Glucose 70 - 99 mg/dL 844   BUN 8 - 23 mg/dL 14   Creatinine 9.38 - 1.24 mg/dL 8.89   Sodium 864 - 854 mmol/L 133   Potassium 3.5 - 5.1 mmol/L 3.9   Chloride 98 - 111 mmol/L 104   CO2 22 - 32 mmol/L 23   Calcium  8.9 - 10.3 mg/dL 8.4   Total Protein 6.5 - 8.1 g/dL 6.5   Total Bilirubin 0.0 - 1.2 mg/dL 0.5   Alkaline Phos 38 - 126 U/L 71   AST 15 - 41 U/L 25   ALT 0 - 44 U/L 30       Latest Ref Rng & Units 11/21/2023   11:07 AM  CBC  WBC 4.0 - 10.5 K/uL 3.2   Hemoglobin 13.0 - 17.0 g/dL 88.9   Hematocrit 60.9 - 52.0 % 33.8   Platelets 150 - 400 K/uL 201     Assessment and plan- Patient is a 83 y.o. male here for routine surveillance visit for rectal cancer  CT chest ab and pelvis with contrast done on 11/05/2023 at [facility] showed findings of small bowel dilatation as well as possible wall thickening and enhancement near the rectal stump.  There was increased presacral soft tissue stranding near the transition point.  Overall findings were suggestive of chronic small bowel obstruction.  Presacral soft tissue thickening nonspecific although malignancy  was not excluded.  Patient had a colonoscopy a year ago which was unremarkable as well.  My plan is to repeat another CT chest  abdomen and pelvis with contrast sometime end of February 2026 and see him thereafter with CBC with differential CMP and CEA.  We are now close to 5 years of surveillance   Visit Diagnosis 1. Encounter for follow-up surveillance of rectal cancer      Dr. Annah Skene, MD, MPH [facility] [phone] 11/21/2023 1:11 PM

## 2023-11-21 NOTE — Progress Notes (Signed)
 Patient was recently discharged from [facility] he had a bad UTI. He is feeling much better.

## 2023-11-23 LAB — CEA: CEA: <0.6 ng/mL (ref 0.0–4.7)

## 2023-11-26 ENCOUNTER — Encounter: Payer: Self-pay | Admitting: Oncology

## 2023-11-26 ENCOUNTER — Telehealth: Payer: Self-pay | Admitting: Oncology

## 2023-11-26 NOTE — Telephone Encounter (Signed)
 Called pt and left vm with appt info for CT in March 2026 - [facility]

## 2024-05-10 ENCOUNTER — Other Ambulatory Visit

## 2024-05-24 ENCOUNTER — Other Ambulatory Visit

## 2024-05-24 ENCOUNTER — Ambulatory Visit: Admitting: Oncology
